# Patient Record
Sex: Male | Born: 1945 | Race: White | Hispanic: No | Marital: Married | Smoking: Current every day smoker
Health system: Southern US, Community
[De-identification: ages and names within clinical notes are randomized; demographics above are authoritative.]

## PROBLEM LIST (undated history)

## (undated) DIAGNOSIS — F03918 Unspecified dementia, unspecified severity, with other behavioral disturbance: Secondary | ICD-10-CM

## (undated) DIAGNOSIS — E119 Type 2 diabetes mellitus without complications: Secondary | ICD-10-CM

## (undated) DIAGNOSIS — F419 Anxiety disorder, unspecified: Secondary | ICD-10-CM

## (undated) DIAGNOSIS — I10 Essential (primary) hypertension: Secondary | ICD-10-CM

## (undated) DIAGNOSIS — Z87828 Personal history of other (healed) physical injury and trauma: Secondary | ICD-10-CM

## (undated) DIAGNOSIS — F0391 Unspecified dementia with behavioral disturbance: Secondary | ICD-10-CM

## (undated) HISTORY — PX: WRIST FRACTURE SURGERY: SHX121

## (undated) HISTORY — PX: OTHER SURGICAL HISTORY: SHX169

## (undated) HISTORY — PX: ROTATOR CUFF REPAIR: SHX139

## (undated) HISTORY — PX: HERNIA REPAIR: SHX51

## (undated) HISTORY — PX: TONSILLECTOMY: SUR1361

---

## 2014-07-07 DIAGNOSIS — Z87820 Personal history of traumatic brain injury: Secondary | ICD-10-CM | POA: Insufficient documentation

## 2014-07-07 DIAGNOSIS — K635 Polyp of colon: Secondary | ICD-10-CM | POA: Insufficient documentation

## 2014-07-12 ENCOUNTER — Emergency Department (HOSPITAL_COMMUNITY): Payer: Medicare HMO

## 2014-07-12 ENCOUNTER — Emergency Department (HOSPITAL_COMMUNITY)
Admission: EM | Admit: 2014-07-12 | Discharge: 2014-07-12 | Disposition: A | Discharge status: Home / Self Care | Payer: Medicare HMO | Attending: Emergency Medicine | Admitting: Emergency Medicine

## 2014-07-12 ENCOUNTER — Encounter (HOSPITAL_COMMUNITY): Payer: Self-pay | Admitting: Emergency Medicine

## 2014-07-12 DIAGNOSIS — F0391 Unspecified dementia with behavioral disturbance: Secondary | ICD-10-CM | POA: Insufficient documentation

## 2014-07-12 DIAGNOSIS — Z720 Tobacco use: Secondary | ICD-10-CM | POA: Diagnosis not present

## 2014-07-12 DIAGNOSIS — E119 Type 2 diabetes mellitus without complications: Secondary | ICD-10-CM | POA: Diagnosis not present

## 2014-07-12 DIAGNOSIS — I951 Orthostatic hypotension: Secondary | ICD-10-CM | POA: Diagnosis not present

## 2014-07-12 DIAGNOSIS — Z87828 Personal history of other (healed) physical injury and trauma: Secondary | ICD-10-CM | POA: Insufficient documentation

## 2014-07-12 DIAGNOSIS — I10 Essential (primary) hypertension: Secondary | ICD-10-CM | POA: Diagnosis not present

## 2014-07-12 DIAGNOSIS — R4182 Altered mental status, unspecified: Secondary | ICD-10-CM | POA: Diagnosis present

## 2014-07-12 DIAGNOSIS — R55 Syncope and collapse: Secondary | ICD-10-CM

## 2014-07-12 HISTORY — DX: Type 2 diabetes mellitus without complications: E11.9

## 2014-07-12 HISTORY — DX: Essential (primary) hypertension: I10

## 2014-07-12 HISTORY — DX: Unspecified dementia with behavioral disturbance: F03.91

## 2014-07-12 HISTORY — DX: Unspecified dementia, unspecified severity, with other behavioral disturbance: F03.918

## 2014-07-12 HISTORY — DX: Anxiety disorder, unspecified: F41.9

## 2014-07-12 HISTORY — DX: Personal history of other (healed) physical injury and trauma: Z87.828

## 2014-07-12 LAB — CBC WITH DIFFERENTIAL/PLATELET
BASOS ABS: 0 10*3/uL (ref 0.0–0.1)
Basophils Relative: 0 % (ref 0–1)
EOS ABS: 0.2 10*3/uL (ref 0.0–0.7)
Eosinophils Relative: 2 % (ref 0–5)
HCT: 43.1 % (ref 39.0–52.0)
Hemoglobin: 14.9 g/dL (ref 13.0–17.0)
LYMPHS ABS: 2.4 10*3/uL (ref 0.7–4.0)
Lymphocytes Relative: 34 % (ref 12–46)
MCH: 32.8 pg (ref 26.0–34.0)
MCHC: 34.6 g/dL (ref 30.0–36.0)
MCV: 94.9 fL (ref 78.0–100.0)
MONOS PCT: 7 % (ref 3–12)
Monocytes Absolute: 0.5 10*3/uL (ref 0.1–1.0)
NEUTROS PCT: 57 % (ref 43–77)
Neutro Abs: 3.9 10*3/uL (ref 1.7–7.7)
PLATELETS: 247 10*3/uL (ref 150–400)
RBC: 4.54 MIL/uL (ref 4.22–5.81)
RDW: 12.9 % (ref 11.5–15.5)
WBC: 6.9 10*3/uL (ref 4.0–10.5)

## 2014-07-12 LAB — URINALYSIS, ROUTINE W REFLEX MICROSCOPIC
BILIRUBIN URINE: NEGATIVE
HGB URINE DIPSTICK: NEGATIVE
Ketones, ur: NEGATIVE mg/dL
Leukocytes, UA: NEGATIVE
Nitrite: NEGATIVE
Protein, ur: NEGATIVE mg/dL
Specific Gravity, Urine: 1.027 (ref 1.005–1.030)
UROBILINOGEN UA: 0.2 mg/dL (ref 0.0–1.0)
pH: 5 (ref 5.0–8.0)

## 2014-07-12 LAB — URINE MICROSCOPIC-ADD ON

## 2014-07-12 LAB — COMPREHENSIVE METABOLIC PANEL
ALK PHOS: 50 U/L (ref 39–117)
ALT: 49 U/L (ref 0–53)
AST: 30 U/L (ref 0–37)
Albumin: 4 g/dL (ref 3.5–5.2)
Anion gap: 11 (ref 5–15)
BUN: 26 mg/dL — AB (ref 6–23)
CALCIUM: 9.5 mg/dL (ref 8.4–10.5)
CHLORIDE: 100 mmol/L (ref 96–112)
CO2: 23 mmol/L (ref 19–32)
CREATININE: 1.5 mg/dL — AB (ref 0.50–1.35)
GFR calc Af Amer: 53 mL/min — ABNORMAL LOW (ref >90)
GFR calc non Af Amer: 45 mL/min — ABNORMAL LOW (ref >90)
Glucose, Bld: 241 mg/dL — ABNORMAL HIGH (ref 70–99)
Potassium: 4.5 mmol/L (ref 3.5–5.1)
Sodium: 134 mmol/L — ABNORMAL LOW (ref 135–145)
Total Bilirubin: 0.6 mg/dL (ref 0.3–1.2)
Total Protein: 6.7 g/dL (ref 6.0–8.3)

## 2014-07-12 LAB — TROPONIN I: Troponin I: <0.03 ng/mL (ref <0.031)

## 2014-07-12 MED ORDER — SODIUM CHLORIDE 0.9 % IV BOLUS (SEPSIS): 500.0000 mL | Freq: Once | INTRAVENOUS | Status: DC

## 2014-07-12 NOTE — ED Provider Notes (Signed)
CSN: [id number]     Arrival date & time 07/12/14  1849 History   First MD Initiated Contact with Patient 07/12/14 1900     Chief Complaint  Patient presents with  . Altered Mental Status    Patient is a 69 y.o. male presenting with altered mental status. The history is provided by the patient, the spouse and a relative. No language interpreter was used.  Altered Mental Status  Dustin Ferrell presents for evaluation of 2 episodes of weakness. He had an episode about 1 PM today when he was bent over to fix the TV and when he got up he had significant truncal ataxia and difficulty with standing and walking. The episode lasted 1-2 minutes. He reported no complaints at that time. He had a second episode while he was getting ready for dinner when he was weak and shaky and was staring off into space and not interact with family.  That episode lasted about 30 seconds. He had no syncope during these events. He denies any current symptoms. He has no history of fever, cough, shortness of breath, chest pain, abdominal pain, nausea, vomiting, diarrhea. He has a history of frontotemporal dementia and high blood pressure as well as diabetes. He was recently started on propanolol for his high blood pressure.  Past Medical History  Diagnosis Date  . Hypertension   . Diabetes mellitus without complication   . 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 Dementia with behavioral disturbance   . History of traumatic head injury    Past Surgical History  Procedure Laterality Date  . Tonsillectomy    . Hernia repair    . Brain surgery     No family history on file. History  Substance Use Topics  . Smoking status: Current Every Day Smoker -- 1.00 packs/day    Types: Cigarettes  . Smokeless tobacco: Not on file  . Alcohol Use: Yes     Comment: heavy drinker until 5 weeks ago    Review of Systems  All other systems reviewed and are negative.     Allergies  Review of patient's  allergies indicates no known allergies.  Home Medications   Prior to Admission medications   Not on File   There were no vitals taken for this visit. Physical Exam  Constitutional: He is oriented to person, place, and time. He appears well-developed and well-nourished.  HENT:  Head: Normocephalic and atraumatic.  Cardiovascular: Normal rate and regular rhythm.   No murmur heard. Pulmonary/Chest: Effort normal and breath sounds normal. No respiratory distress.  Abdominal: Soft. There is no tenderness. There is no rebound and no guarding.  Musculoskeletal: He exhibits no edema or tenderness.  Neurological: He is alert and oriented to person, place, and time.  Skin: Skin is warm and dry.  Psychiatric: He has a normal mood and affect. His behavior is normal.  Nursing note and vitals reviewed.   ED Course  Procedures (including critical care time) Labs Review Labs Reviewed  COMPREHENSIVE METABOLIC PANEL - Abnormal; Notable for the following:    Sodium 134 (*)    Glucose, Bld 241 (*)    BUN 26 (*)    Creatinine, Ser 1.50 (*)    GFR calc non Af Amer 45 (*)    GFR calc Af Amer 53 (*)    All other components within normal limits  URINALYSIS, ROUTINE W REFLEX MICROSCOPIC - Abnormal; Notable for the following:    Glucose, UA >1000 (*)  All other components within normal limits  CBC WITH DIFFERENTIAL/PLATELET  TROPONIN I  URINE MICROSCOPIC-ADD ON    Imaging Review Dg Chest 2 View  07/12/2014   CLINICAL DATA:  Two episodes of being unable to speak  EXAM: CHEST  2 VIEW  COMPARISON:  None.  FINDINGS: The lungs are clear. There are no effusions. Pulmonary vasculature is normal. Hilar, mediastinal and cardiac contours appear unremarkable.  Old healed fracture deformities are incidentally noted about several left ribs. No significant skeletal abnormalities are evident.  IMPRESSION: No active cardiopulmonary disease.   Electronically Signed   By: Andreas Newport M.D.   On: 07/12/2014  20:29   Ct Head Wo Contrast  07/12/2014   CLINICAL DATA:  Altered mental status  EXAM: CT HEAD WITHOUT CONTRAST  TECHNIQUE: Contiguous axial images were obtained from the base of the skull through the vertex without intravenous contrast.  COMPARISON:  None.  FINDINGS: There is age related volume loss. There is encephalomalacia in the right frontal lobe. There is no mass, hemorrhage, extra-axial fluid collection, or midline shift. There is mild small vessel disease in the centra semiovale bilaterally. No acute appearing infarct present. There is evidence of previous craniotomy in the right inferior frontal region. Elsewhere bony calvarium appears intact. Mastoid air cells are clear. There is multifocal ethmoid sinus disease bilaterally. There is also mucosal thickening in the left frontal sinus. There are retention cysts in each inferior maxillary antrum.  IMPRESSION: Extensive right frontal lobe encephalomalacia with postoperative change in this area. Age related volume loss with mild periventricular small vessel disease. No hemorrhage or mass effect. No acute appearing infarct. Multifocal paranasal sinus disease.   Electronically Signed   By: Lowella Grip III M.D.   On: 07/12/2014 20:53     EKG Interpretation   Date/Time:  Saturday July 12 2014 19:06:47 EST Ventricular Rate:  62 PR Interval:  168 QRS Duration: 110 QT Interval:  423 QTC Calculation: 429 R Axis:   48 Text Interpretation:  Sinus rhythm Q waves in V1, V2. borderline ST  elevatio in V2 with baseline wander Confirmed by Dustin Ferrell [phone]) on  07/12/2014 7:22:49 PM      MDM   Final diagnoses:  Orthostatic hypotension  Near syncope    Patient here for evaluation of 2 episodes. First episodes sounds like orthostatic hypotension with near-syncope. The second episode could be due to orthostatic hypotension versus seizure. Clinical picture is not consistent with CVA/TIA. Patient was orthostatic in the department -  recommended IV fluid hydration and reassessment but family prefers DC home with oral fluid hydration. Discussed importance of continuing hydration with close PCP follow-up for recheck of his creatinine and electrolytes. Also discussed return precautions. Patient referred to go for [facility] for further evaluation of his dementia and near-syncope versus seizure.  BMP with creatinine of 1.5, family unsure of his baseline.    Dustin Reichert, MD 07/12/14 [id number]

## 2014-07-12 NOTE — ED Notes (Addendum)
Pt arrives via ems, c/o 2 periods of patient standing, not being able to talk to family in a "catatonic like state" and some shaking according to his daughter. Pt states he remembers both events, alert, oriented. Pt also having some fluctuation in blood pressure today with it being very high then low, family states pt just began propranolol on Monday. Pt alert, oriented, nad.

## 2014-07-12 NOTE — Discharge Instructions (Signed)
Your Creatinine was elevated today at 1.5.  This needs to be rechecked in the next 2 days.  Drink lots of fluids for the next few days.  Stop taking the propanolol.  Check Dustin Ferrell's blood pressure one to two times daily.  Get rechecked immediately if he develops any new or worrisome symptoms.     Hypotension As your heart beats, it forces blood through your arteries. This force is your blood pressure. If your blood pressure is too low for you to go about your normal activities or to support the organs of your body, you have hypotension. Hypotension is also referred to as low blood pressure. When your blood pressure becomes too low, you may not get enough blood to your brain. As a result, you may feel weak, feel lightheaded, or develop a rapid heart rate. In a more severe case, you may faint. CAUSES Various conditions can cause hypotension. These include:  Blood loss.  Dehydration.  Heart or endocrine problems.  Pregnancy.  Severe infection.  Not having a well-balanced diet filled with needed nutrients.  Severe allergic reactions (anaphylaxis). Some medicines, such as blood pressure medicine or water pills (diuretics), may lower your blood pressure below normal. Sometimes taking too much medicine or taking medicine not as directed can cause hypotension. TREATMENT  Hospitalization is sometimes required for hypotension if fluid or blood replacement is needed, if time is needed for medicines to wear off, or if further monitoring is needed. Treatment might include changing your diet, changing your medicines (including medicines aimed at raising your blood pressure), and use of support stockings. HOME CARE INSTRUCTIONS   Drink enough fluids to keep your urine clear or pale yellow.  Take your medicines as directed by your health care provider.  Get up slowly from reclining or sitting positions. This gives your blood pressure a chance to adjust.  Wear support stockings as directed by your  health care provider.  Maintain a healthy diet by including nutritious food, such as fruits, vegetables, nuts, whole grains, and lean meats. SEEK MEDICAL CARE IF:  You have vomiting or diarrhea.  You have a fever for more than 2-3 days.  You feel more thirsty than usual.  You feel weak and tired. SEEK IMMEDIATE MEDICAL CARE IF:   You have chest pain or a fast or irregular heartbeat.  You have a loss of feeling in some part of your body, or you lose movement in your arms or legs.  You have trouble speaking.  You become sweaty or feel lightheaded.  You faint. MAKE SURE YOU:   Understand these instructions.  Will watch your condition.  Will get help right away if you are not doing well or get worse. Document Released: 05/16/2005 Document Revised: 03/06/2013 Document Reviewed: 11/16/2012 [facility] Patient Information 2015 [place], [place]. This information is not intended to replace advice given to you by your health care provider. Make sure you discuss any questions you have with your health care provider.  Near-Syncope Near-syncope (commonly known as near fainting) is sudden weakness, dizziness, or feeling like you might pass out. During an episode of near-syncope, you may also develop pale skin, have tunnel vision, or feel sick to your stomach (nauseous). Near-syncope may occur when getting up after sitting or while standing for a long time. It is caused by a sudden decrease in blood flow to the brain. This decrease can result from various causes or triggers, most of which are not serious. However, because near-syncope can sometimes be a sign of  something serious, a medical evaluation is required. The specific cause is often not determined. HOME CARE INSTRUCTIONS  Monitor your condition for any changes. The following actions may help to alleviate any discomfort you are experiencing:  Have someone stay with you until you feel stable.  Lie down right away and prop your feet up if  you start feeling like you might faint. Breathe deeply and steadily. Wait until all the symptoms have passed. Most of these episodes last only a few minutes. You may feel tired for several hours.   Drink enough fluids to keep your urine clear or pale yellow.   If you are taking blood pressure or heart medicine, get up slowly when seated or lying down. Take several minutes to sit and then stand. This can reduce dizziness.  Follow up with your health care provider as directed. SEEK IMMEDIATE MEDICAL CARE IF:   You have a severe headache.   You have unusual pain in the chest, abdomen, or back.   You are bleeding from the mouth or rectum, or you have black or tarry stool.   You have an irregular or very fast heartbeat.   You have repeated fainting or have seizure-like jerking during an episode.   You faint when sitting or lying down.   You have confusion.   You have difficulty walking.   You have severe weakness.   You have vision problems.  MAKE SURE YOU:   Understand these instructions.  Will watch your condition.  Will get help right away if you are not doing well or get worse. Document Released: 05/16/2005 Document Revised: 05/21/2013 Document Reviewed: 10/19/2012 [facility] Patient Information 2015 [place], [place]. This information is not intended to replace advice given to you by your health care provider. Make sure you discuss any questions you have with your health care provider.

## 2014-07-12 NOTE — ED Notes (Signed)
MD at bedside. 

## 2014-07-15 DIAGNOSIS — G903 Multi-system degeneration of the autonomic nervous system: Secondary | ICD-10-CM | POA: Insufficient documentation

## 2014-07-15 DIAGNOSIS — I951 Orthostatic hypotension: Secondary | ICD-10-CM | POA: Insufficient documentation

## 2014-09-30 ENCOUNTER — Emergency Department (HOSPITAL_COMMUNITY)
Admission: EM | Admit: 2014-09-30 | Discharge: 2014-10-01 | Disposition: A | Discharge status: Home / Self Care | Payer: Medicare HMO | Attending: Emergency Medicine | Admitting: Emergency Medicine

## 2014-09-30 ENCOUNTER — Emergency Department (HOSPITAL_COMMUNITY): Payer: Medicare HMO

## 2014-09-30 ENCOUNTER — Encounter (HOSPITAL_COMMUNITY): Payer: Self-pay

## 2014-09-30 DIAGNOSIS — F0280 Dementia in other diseases classified elsewhere without behavioral disturbance: Secondary | ICD-10-CM | POA: Diagnosis not present

## 2014-09-30 DIAGNOSIS — E119 Type 2 diabetes mellitus without complications: Secondary | ICD-10-CM | POA: Insufficient documentation

## 2014-09-30 DIAGNOSIS — F419 Anxiety disorder, unspecified: Secondary | ICD-10-CM | POA: Diagnosis not present

## 2014-09-30 DIAGNOSIS — R55 Syncope and collapse: Secondary | ICD-10-CM | POA: Diagnosis not present

## 2014-09-30 DIAGNOSIS — R410 Disorientation, unspecified: Secondary | ICD-10-CM

## 2014-09-30 DIAGNOSIS — G3109 Other frontotemporal dementia: Secondary | ICD-10-CM | POA: Diagnosis not present

## 2014-09-30 DIAGNOSIS — R270 Ataxia, unspecified: Secondary | ICD-10-CM | POA: Insufficient documentation

## 2014-09-30 DIAGNOSIS — R278 Other lack of coordination: Secondary | ICD-10-CM

## 2014-09-30 DIAGNOSIS — I10 Essential (primary) hypertension: Secondary | ICD-10-CM | POA: Diagnosis not present

## 2014-09-30 DIAGNOSIS — Z720 Tobacco use: Secondary | ICD-10-CM | POA: Diagnosis not present

## 2014-09-30 DIAGNOSIS — R569 Unspecified convulsions: Secondary | ICD-10-CM

## 2014-09-30 DIAGNOSIS — Z7982 Long term (current) use of aspirin: Secondary | ICD-10-CM | POA: Diagnosis not present

## 2014-09-30 DIAGNOSIS — Z87828 Personal history of other (healed) physical injury and trauma: Secondary | ICD-10-CM | POA: Diagnosis not present

## 2014-09-30 DIAGNOSIS — Z79899 Other long term (current) drug therapy: Secondary | ICD-10-CM | POA: Insufficient documentation

## 2014-09-30 LAB — CBC
HCT: 40.5 % (ref 39.0–52.0)
Hemoglobin: 13.6 g/dL (ref 13.0–17.0)
MCH: 32.1 pg (ref 26.0–34.0)
MCHC: 33.6 g/dL (ref 30.0–36.0)
MCV: 95.5 fL (ref 78.0–100.0)
PLATELETS: 260 10*3/uL (ref 150–400)
RBC: 4.24 MIL/uL (ref 4.22–5.81)
RDW: 13.6 % (ref 11.5–15.5)
WBC: 6.9 10*3/uL (ref 4.0–10.5)

## 2014-09-30 LAB — COMPREHENSIVE METABOLIC PANEL
ALT: 33 U/L (ref 17–63)
AST: 23 U/L (ref 15–41)
Albumin: 3.8 g/dL (ref 3.5–5.0)
Alkaline Phosphatase: 45 U/L (ref 38–126)
Anion gap: 16 — ABNORMAL HIGH (ref 5–15)
BILIRUBIN TOTAL: 0.4 mg/dL (ref 0.3–1.2)
BUN: 13 mg/dL (ref 6–20)
CALCIUM: 9.3 mg/dL (ref 8.9–10.3)
CO2: 19 mmol/L — AB (ref 22–32)
Chloride: 100 mmol/L — ABNORMAL LOW (ref 101–111)
Creatinine, Ser: 0.83 mg/dL (ref 0.61–1.24)
GFR calc Af Amer: >60 mL/min (ref >60)
Glucose, Bld: 224 mg/dL — ABNORMAL HIGH (ref 70–99)
POTASSIUM: 4.5 mmol/L (ref 3.5–5.1)
Sodium: 135 mmol/L (ref 135–145)
Total Protein: 6.3 g/dL — ABNORMAL LOW (ref 6.5–8.1)

## 2014-09-30 LAB — URINALYSIS, ROUTINE W REFLEX MICROSCOPIC
Bilirubin Urine: NEGATIVE
GLUCOSE, UA: 250 mg/dL — AB
HGB URINE DIPSTICK: NEGATIVE
Ketones, ur: 15 mg/dL — AB
LEUKOCYTES UA: NEGATIVE
Nitrite: NEGATIVE
PROTEIN: NEGATIVE mg/dL
SPECIFIC GRAVITY, URINE: 1.018 (ref 1.005–1.030)
Urobilinogen, UA: 0.2 mg/dL (ref 0.0–1.0)
pH: 5 (ref 5.0–8.0)

## 2014-09-30 LAB — CBG MONITORING, ED: GLUCOSE-CAPILLARY: 224 mg/dL — AB (ref 70–99)

## 2014-09-30 LAB — I-STAT TROPONIN, ED: Troponin i, poc: 0 ng/mL (ref 0.00–0.08)

## 2014-09-30 NOTE — ED Notes (Signed)
Pt here for syncopal episodes over the last week, pt moved here in December and since has had issues with DM and allergies. Nasal congestion noted. Pt joking with staff on assessment, pt denies pain, answers all questions appropriately

## 2014-09-30 NOTE — ED Notes (Signed)
Patient has not returned from CT or Xray.

## 2014-09-30 NOTE — ED Provider Notes (Signed)
CSN: [id number]     Arrival date & time 09/30/14  1953 History   First MD Initiated Contact with Patient 09/30/14 2000     Chief Complaint  Patient presents with  . Loss of Consciousness     (Consider location/radiation/quality/duration/timing/severity/associated sxs/prior Treatment) Patient is a 69 y.o. male presenting with syncope. The history is provided by the spouse and a relative. No language interpreter was used.  Loss of Consciousness Episode history:  Single Most recent episode:  Today Progression:  Resolved Chronicity:  New Context comment:  During episode of ataxia, confusion Witnessed: yes   Relieved by:  Lying down Worsened by:  Nothing tried Ineffective treatments:  None tried Associated symptoms: confusion   Associated symptoms: no chest pain, no difficulty breathing, no fever, no focal weakness, no shortness of breath, no vomiting and no weakness   Risk factors: no coronary artery disease     Past Medical History  Diagnosis Date  . Hypertension   . Diabetes mellitus without complication   . 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 Dementia with behavioral disturbance   . History of traumatic head injury    Past Surgical History  Procedure Laterality Date  . Tonsillectomy    . Hernia repair    . Brain surgery     History reviewed. No pertinent family history. History  Substance Use Topics  . Smoking status: Current Every Day Smoker -- 1.00 packs/day    Types: Cigarettes  . Smokeless tobacco: Not on file  . Alcohol Use: Yes     Comment: heavy drinker until 5 weeks ago    Review of Systems  Unable to perform ROS: Dementia  Constitutional: Negative for fever and fatigue.  Respiratory: Negative for chest tightness and shortness of breath.   Cardiovascular: Positive for syncope. Negative for chest pain.  Gastrointestinal: Negative for vomiting and abdominal pain.  Musculoskeletal: Positive for gait problem (intermittent  truncal ataxia, wide based gait).  Neurological: Negative for focal weakness, facial asymmetry and weakness.  Psychiatric/Behavioral: Positive for behavioral problems, confusion and agitation.  All other systems reviewed and are negative.    Allergies  Review of patient's allergies indicates no known allergies.  Home Medications   Prior to Admission medications   Medication Sig Start Date End Date Taking? Authorizing Provider  aspirin EC 81 MG tablet Take 81 mg by mouth daily.   Yes Historical Provider, MD  citalopram (CELEXA) 40 MG tablet Take 40 mg by mouth daily.   Yes Historical Provider, MD  clonazePAM (KLONOPIN) 1 MG tablet Take 0.5 mg by mouth daily.    Yes Historical Provider, MD  divalproex (DEPAKOTE ER) 500 MG 24 hr tablet Take 500 mg by mouth daily.   Yes Historical Provider, MD  lisinopril (PRINIVIL,ZESTRIL) 5 MG tablet Take 5 mg by mouth daily.   Yes Historical Provider, MD  metFORMIN (GLUCOPHAGE) 1000 MG tablet Take 1,000 mg by mouth 2 (two) times daily with a meal.   Yes Historical Provider, MD  sitaGLIPtin (JANUVIA) 100 MG tablet Take 100 mg by mouth daily.   Yes Historical Provider, MD   BP 122/57 mmHg  Pulse 83  Temp(Src) 97.8 F (36.6 C) (Oral)  Resp 15  Ht 5' 6.5" (1.689 m)  Wt 158 lb (71.668 kg)  BMI 25.12 kg/m2  SpO2 94%   Physical Exam  Constitutional: Vital signs are normal. He appears well-developed and well-nourished. He is easily aroused.  Non-toxic appearance. No distress.  HENT:  Head:  Normocephalic and atraumatic.  Nose: Nose normal.  Mouth/Throat: Oropharynx is clear and moist. No oropharyngeal exudate.  Eyes: EOM are normal. Pupils are equal, round, and reactive to light.  Neck: Normal range of motion. Neck supple.  Cardiovascular: Normal rate, regular rhythm, normal heart sounds and intact distal pulses.   No murmur heard. Pulmonary/Chest: Effort normal and breath sounds normal. No respiratory distress. He has no wheezes. He exhibits no  tenderness.  Abdominal: Soft. He exhibits no distension. There is no tenderness. There is no guarding.  Musculoskeletal: Normal range of motion. He exhibits no tenderness.  Atraumatic exam, moving all extremities.  Normal coordination.    Neurological: He is alert and easily aroused. No cranial nerve deficit. Coordination normal.  Oriented to person and place.  Unable to provide significant details about the event but remembers EMS arriving.  Oriented to family at bedside.  Mild limitations at higher level brain functioning/reasoning noted.    Skin: Skin is warm and dry. He is not diaphoretic. No pallor.  Psychiatric: He has a normal mood and affect. His behavior is normal. Judgment and thought content normal.  Nursing note and vitals reviewed.   ED Course  Procedures (including critical care time) Labs Review Labs Reviewed  COMPREHENSIVE METABOLIC PANEL - Abnormal; Notable for the following:    Chloride 100 (*)    CO2 19 (*)    Glucose, Bld 224 (*)    Total Protein 6.3 (*)    Anion gap 16 (*)    All other components within normal limits  URINALYSIS, ROUTINE W REFLEX MICROSCOPIC - Abnormal; Notable for the following:    Glucose, UA 250 (*)    Ketones, ur 15 (*)    All other components within normal limits  VALPROIC ACID LEVEL - Abnormal; Notable for the following:    Valproic Acid Lvl <10 (*)    All other components within normal limits  CBG MONITORING, ED - Abnormal; Notable for the following:    Glucose-Capillary 224 (*)    All other components within normal limits  CBC  I-STAT TROPOININ, ED    Imaging Review Dg Chest 2 View  09/30/2014   CLINICAL DATA:  Syncopal episode.  Hypertension.  Diabetes.  EXAM: CHEST  2 VIEW  COMPARISON:  07/12/2014  FINDINGS: There are old healed rib fracture deformities of the left lateral chest wall. The lungs are clear. The pulmonary vasculature is normal. Hilar, mediastinal and cardiac contours are unremarkable and unchanged. There are no pleural  effusions.  IMPRESSION: No active cardiopulmonary disease.   Electronically Signed   By: Andreas Newport M.D.   On: 09/30/2014 21:24   Ct Head Wo Contrast  09/30/2014   CLINICAL DATA:  Multiple syncopal episodes over last week, diabetes, hypertension, history of traumatic head injury and smoking  EXAM: CT HEAD WITHOUT CONTRAST  TECHNIQUE: Contiguous axial images were obtained from the base of the skull through the vertex without intravenous contrast.  COMPARISON:  07/12/2014  FINDINGS: Wire sutures at RIGHT supraorbital rim.  Linear lucency in the frontal bone could be related to a prior frontal bone fracture or surgery.  Generalized atrophy.  Normal ventricular morphology.  No midline shift or mass effect.  Encephalomalacia at RIGHT frontal lobe compatible with infarction, injury or surgery, stable.  Minimal small vessel chronic ischemic changes of deep cerebral white matter.  No intracranial hemorrhage, mass lesion or evidence acute infarction.  No extra-axial fluid collections.  Maxillary, frontal and ethmoid sinus disease.  Atherosclerotic calcifications at the carotid  siphons.  IMPRESSION: Chronic RIGHT frontal lobe encephalomalacia from either prior trauma, infarction or surgery.  Minimal small vessel chronic ischemic changes of deep cerebral white matter.  No acute intracranial abnormalities.   Electronically Signed   By: Lavonia Dana M.D.   On: 09/30/2014 22:03     EKG Interpretation   Date/Time:  Tuesday Sep 30 2014 19:59:16 EDT Ventricular Rate:  73 PR Interval:  165 QRS Duration: 79 QT Interval:  393 QTC Calculation: 433 R Axis:   40 Text Interpretation:  Sinus rhythm Probable left atrial enlargement RSR'  in V1 or V2, right VCD or RVH Minimal ST elevation, anterior leads No  significant change since last tracing Confirmed by BEATON  MD, ROBERT  ([id number]) on 09/30/2014 8:07:48 PM      MDM   Final diagnoses:  Truncal ataxia  Confusion  Fronto-temporal dementia  Seizure-like  activity  Syncope and collapse   Pt is a 69 yo M with hx of HTN, DM, frontotemporal dementia, who presents with recurrent episodes episodes of truncal ataxia, increased agitation/behavior changes, and today had an associated syncopal event during it.  He lives at home with wife but is able to take care of most of his ADLs independently.  The patient is still driving, able to work in the yard, [employer], [employer] research officer, government. Family reports that he has had increased episodes where he is unsteady on his feet, has a wide based gait, drops things, and during these periods the patient becomes increasingly agitated and confused.  Then, after the acute ataxia/agitation occurs, the patient then tends to have a period of post-ictal like fatigue.  These events have occurred over the past several months, and are becoming more frequent with up to 3 x weekly now.  Family reports that he had a similar episode today.  He was in his normal state of health cooking, then the wife heard him dropping plates on the ground, found him standing in front of the oven with unsteady gait, he was agitated and yelling.  Wife helped him sit down in a chair and in the process of sitting, the patient went limp, fell to the ground, and had + LOC for several moments on the ground, concerning for syncope.  The patient regained consciousness and was again very agitated.  The family tried to get him in the car to come get evaluated in the ED for the syncopal episode, but he was so aggressive towards the family they were unable to get him in the car.  EMS was called and the patient received a dose of benzos then was brought to the ED.  He takes depakote 500 mg qHS to help sleep and to help control his behavior changes.  No hx of seizure in the past.  Hx of HTN but no cardiac disease.  No hx of syncope before.   Will work up for syncopal event/confusion with EKG, CT head, and blood work.   History concerning for possible recurrent seizure-like events with postictal  confusion/agitation then somnolence.  Depakote level sent.   Glucose 224, AG 16, CO2 19.  Has a hx of diabetes but has recently been decreasing down his meds per PCP after these ataxic events have been occurring more frequently.  Now is controlled on metformin only, previously also on januvia.   CT head showed right frontal encephalomalacia, no new changes.   EKG: NSR at 73 bpm, nonspecific ST elevation changes likely associated with baseline wander and artifact, no specific changes suggestive of ischemia or  arrhythmia.  T waves benign.  Normal sodium and potassium.  Good kidney function.  No anemia.  Trop negative.    Depakote level < 10.  Neurology was consulted due to the frequent ataxic episodes.   Dr. Aram Beecham reports that it is somewhat atypical history for seizures, but that seizures frequently occur with frontotemporal demenita.  He recommends increasing depakote to BID and to follow up with outpatient EEG with Dr. Delice Lesch.    Initial syncope work up was benign.    Discussed with family (wife and daughters) at length about options/goals of care.  The patient is full code but the family voices hesitation about some possible future interventions.  They would be agreeable to a surgery that would improve patient's quality of life but do not believe they would want to pursue any elective procedures at this point.  One of his daughters is a Family Medicine physician who was very helpful in this discussion with pt's wife.   Advised that as the patient has not had any evidence of cardiogenic pathology on the work up today, has negative biomarkers, and his syncopal event was associated with this disorientation/possible seizure event, that we likely will not find much on telemetry.  Doubt that staying overnight on a telemetry monitor will bring much to the table as far as uncovering a cause of today's syncope.  I believe the patient would be appropriate for further outpatient syncope work up with MRI and  carotid ultrasounds if the family wishes to pursue this.  Family voiced understanding and agreement to this plan.  They have a PCP and are able to follow up with Dr. Jonni Sanger as needed.   Patient has an appointment scheduled at [facility] clinic in a few weeks to establish care there with a dementia specialist.  Has not seen a neurologist before in the area.  Given phone number to follow up with Dr. Delice Lesch at [facility] neurolog[facility] to discuss EEG.   Discussed with the patient and family about risks of driving with dementia and with these episodes of incoordination/confusion.  Has had 4 episodes like this already in the past week and it is highly risky to continue to use machinery/drive.  It is unfortunate that patient's mental status waxes and wanes so much because he feels like his freedom is being taken away at his lucid times.  The patient does not seem to comprehend the risks that are involved with driving and what could happen with worse case scenarios of confusion/incoordination while behind the wheel.  He was advised that it is my recommendation that he does not drive again, or at least until these episodes are treated and do not recur.  Advised the patient and family that it is my recommendation to take his driving privileges away for now.  The family will continue to discuss this at home as it is understandably a source of significant distress to the patient.  Patient and family were given reassurance and all questions were answered prior to discharge in stable condition.   Patient was seen with ED Attending, Dr. Lucina Mellow, MD      Tori Milks, MD 10/01/14 1740  Leonard Schwartz, MD 10/08/14 1038

## 2014-10-01 DIAGNOSIS — R569 Unspecified convulsions: Secondary | ICD-10-CM

## 2014-10-01 LAB — VALPROIC ACID LEVEL: Valproic Acid Lvl: <10 ug/mL — ABNORMAL LOW (ref 50.0–100.0)

## 2014-10-01 MED ORDER — DIVALPROEX SODIUM ER 500 MG PO TB24: 500.0000 mg | ORAL_TABLET | Freq: Two times a day (BID) | ORAL | Status: DC

## 2014-10-01 NOTE — Consult Note (Addendum)
NEURO HOSPITALIST CONSULT NOTE   Referring physician: ED Reason for Consult: episodic truncal ataxia with falls and transient LOC, concern for seizures  HPI:                                                                                                                                          Dustin Ferrell is an 69 y.o. male with a past medical history significant for HTN, DM, severe head trauma many years ago, and behavioral variant fronto temporal dementia, comes in for further evaluation of the above stated symptoms. Patient's wife and daughter are at the bedside. His daughter is a family medicine physician and tells me that his father carries a diagnosis of probable FTD behavioral variant but still very functional, active individual that is still driving. She said that over the past few weeks his father has been experiencing increasing frequency of paroxysmal events characterized by sudden onset of truncal ataxia with marked unsteadiness, slurred speech, a drunk looking/stuporous looking, inability to follow commands, becomes combative, then falls to the floor and can have very brief periods of unconsciousness. No hyper motor activity noted, no bladder or bowel impairment. The episodes can last from 10 minutes to 1.5 hours, and afterwards he is amnestic for the events. He is averaging 3-4 episodes every week in the past 2-3 weeks. CT brain performed today was personally reviewed and  showed no acute abnormality but encephalomalacia right frontal region. Presently, he denies HA, vertigo, double vision, difficulty swallowing, slurred speech, focal weakness or numbness, or vision impairment. No new medications.  Takes Depakote 500 mg daily, VPA level <10 Past Medical History  Diagnosis Date  . Hypertension   . Diabetes mellitus without complication   . 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 Dementia with behavioral disturbance   . History  of traumatic head injury     Past Surgical History  Procedure Laterality Date  . Tonsillectomy    . Hernia repair    . Brain surgery      History reviewed. No pertinent family history.  Family History: no epilepsy, brain tumors, or brain aneurysms.  Social History:  reports that he has been smoking Cigarettes.  He has been smoking about 1.00 pack per day. He does not have any smokeless tobacco history on file. He reports that he drinks alcohol. He reports that he does not use illicit drugs.  No Known Allergies  MEDICATIONS:  I have reviewed the patient's current medications.   ROS:                                                                                                                                       History obtained from family and chart review  General ROS: negative for - chills, fatigue, fever, night sweats, weight gain or weight loss Psychological ROS: negative for - hallucinations or suicidal ideation Ophthalmic ROS: negative for - blurry vision, double vision, eye pain or loss of vision ENT ROS: negative for - epistaxis, nasal discharge, oral lesions, sore throat, tinnitus or vertigo Allergy and Immunology ROS: negative for - hives or itchy/watery eyes Hematological and Lymphatic ROS: negative for - bleeding problems, bruising or swollen lymph nodes Endocrine ROS: negative for - galactorrhea, hair pattern changes, polydipsia/polyuria or temperature intolerance Respiratory ROS: negative for - cough, hemoptysis, shortness of breath or wheezing Cardiovascular ROS: negative for - chest pain, dyspnea on exertion, edema or irregular heartbeat Gastrointestinal ROS: negative for - abdominal pain, diarrhea, hematemesis, nausea/vomiting or stool incontinence Genito-Urinary ROS: negative for - dysuria, hematuria, incontinence or urinary  frequency/urgency Musculoskeletal ROS: negative for - joint swelling or muscular weakness Neurological ROS: as noted in HPI Dermatological ROS: negative for rash and skin lesion changes   Physical exam: pleasant male in no apparent distress. Blood pressure 151/69, pulse 83, temperature 97.8 F (36.6 C), temperature source Oral, resp. rate 23, height 5' 6.5" (1.689 m), weight 71.668 kg (158 lb), SpO2 94 %. Head: normocephalic. Neck: supple, no bruits, no JVD. Cardiac: no murmurs. Lungs: clear. Abdomen: soft, no tender, no mass. Extremities: no edema, clubbing, or cyanosis. Neurologic Examination:                                                                                                      General: Mental Status: Alert, oriented, thought content appropriate.  Speech fluent without evidence of aphasia.  Able to follow 3 step commands without difficulty. Cranial Nerves: II: Discs flat bilaterally; Visual fields grossly normal, pupils equal, round, reactive to light and accommodation III,IV, VI: ptosis not present, extra-ocular motions intact bilaterally V,VII: smile symmetric, facial light touch sensation normal bilaterally VIII: hearing normal bilaterally IX,X: uvula rises symmetrically XI: bilateral shoulder shrug XII: midline tongue extension without atrophy or fasciculations Motor: Right : Upper extremity   5/5    Left:     Upper extremity   5/5  Lower extremity   5/5     Lower extremity  5/5 Tone and bulk:normal tone throughout; no atrophy noted Sensory: Pinprick and light touch intact throughout, bilaterally Deep Tendon Reflexes:  Right: Upper Extremity   Left: Upper extremity   biceps (C-5 to C-6) 2/4   biceps (C-5 to C-6) 2/4 tricep (C7) 2/4    triceps (C7) 2/4 Brachioradialis (C6) 2/4  Brachioradialis (C6) 2/4  Lower Extremity Lower Extremity  quadriceps (L-2 to L-4) 2/4   quadriceps (L-2 to L-4) 2/4 Achilles (S1) 2/4   Achilles (S1) 2/4  Plantars: Right:  downgoing   Left: downgoing Cerebellar: normal finger-to-nose,  normal heel-to-shin test Gait:  No tested due to multiple leads. CV: pulses palpable throughout    No results found for: CHOL  Results for orders placed or performed during the hospital encounter of 09/30/14 (from the past 48 hour(s))  CBC     Status: None   Collection Time: 09/30/14  8:13 PM  Result Value Ref Range   WBC 6.9 4.0 - 10.5 K/uL   RBC 4.24 4.22 - 5.81 MIL/uL   Hemoglobin 13.6 13.0 - 17.0 g/dL   HCT 40.5 39.0 - 52.0 %   MCV 95.5 78.0 - 100.0 fL   MCH 32.1 26.0 - 34.0 pg   MCHC 33.6 30.0 - 36.0 g/dL   RDW 13.6 11.5 - 15.5 %   Platelets 260 150 - 400 K/uL  Comprehensive metabolic panel     Status: Abnormal   Collection Time: 09/30/14  8:13 PM  Result Value Ref Range   Sodium 135 135 - 145 mmol/L   Potassium 4.5 3.5 - 5.1 mmol/L   Chloride 100 (L) 101 - 111 mmol/L   CO2 19 (L) 22 - 32 mmol/L   Glucose, Bld 224 (H) 70 - 99 mg/dL   BUN 13 6 - 20 mg/dL   Creatinine, Ser 0.83 0.61 - 1.24 mg/dL   Calcium 9.3 8.9 - 10.3 mg/dL   Total Protein 6.3 (L) 6.5 - 8.1 g/dL   Albumin 3.8 3.5 - 5.0 g/dL   AST 23 15 - 41 U/L   ALT 33 17 - 63 U/L   Alkaline Phosphatase 45 38 - 126 U/L   Total Bilirubin 0.4 0.3 - 1.2 mg/dL   GFR calc non Af Amer >60 >60 mL/min   GFR calc Af Amer >60 >60 mL/min    Comment: (NOTE) The eGFR has been calculated using the CKD EPI equation. This calculation has not been validated in all clinical situations. eGFR's persistently <90 mL/min signify possible Chronic Kidney Disease.    Anion gap 16 (H) 5 - 15  CBG monitoring, ED     Status: Abnormal   Collection Time: 09/30/14 10:23 PM  Result Value Ref Range   Glucose-Capillary 224 (H) 70 - 99 mg/dL  Urinalysis, Routine w reflex microscopic     Status: Abnormal   Collection Time: 09/30/14 10:27 PM  Result Value Ref Range   Color, Urine YELLOW YELLOW   APPearance CLEAR CLEAR   Specific Gravity, Urine 1.018 1.005 - 1.030   pH 5.0 5.0  - 8.0   Glucose, UA 250 (A) NEGATIVE mg/dL   Hgb urine dipstick NEGATIVE NEGATIVE   Bilirubin Urine NEGATIVE NEGATIVE   Ketones, ur 15 (A) NEGATIVE mg/dL   Protein, ur NEGATIVE NEGATIVE mg/dL   Urobilinogen, UA 0.2 0.0 - 1.0 mg/dL   Nitrite NEGATIVE NEGATIVE   Leukocytes, UA NEGATIVE NEGATIVE    Comment: MICROSCOPIC NOT DONE ON URINES WITH NEGATIVE PROTEIN, BLOOD, LEUKOCYTES, NITRITE, OR GLUCOSE <1000 mg/dL.  Valproic acid  level     Status: Abnormal   Collection Time: 09/30/14 11:13 PM  Result Value Ref Range   Valproic Acid Lvl <10 (L) 50.0 - 100.0 ug/mL    Comment: RESULTS CONFIRMED BY MANUAL DILUTION  I-Stat Troponin, ED (not at [facility])     Status: None   Collection Time: 09/30/14 11:15 PM  Result Value Ref Range   Troponin i, poc 0.00 0.00 - 0.08 ng/mL   Comment 3            Comment: Due to the release kinetics of cTnI, a negative result within the first hours of the onset of symptoms does not rule out myocardial infarction with certainty. If myocardial infarction is still suspected, repeat the test at appropriate intervals.     Dg Chest 2 View  09/30/2014   CLINICAL DATA:  Syncopal episode.  Hypertension.  Diabetes.  EXAM: CHEST  2 VIEW  COMPARISON:  07/12/2014  FINDINGS: There are old healed rib fracture deformities of the left lateral chest wall. The lungs are clear. The pulmonary vasculature is normal. Hilar, mediastinal and cardiac contours are unremarkable and unchanged. There are no pleural effusions.  IMPRESSION: No active cardiopulmonary disease.   Electronically Signed   By: Andreas Newport M.D.   On: 09/30/2014 21:24   Ct Head Wo Contrast  09/30/2014   CLINICAL DATA:  Multiple syncopal episodes over last week, diabetes, hypertension, history of traumatic head injury and smoking  EXAM: CT HEAD WITHOUT CONTRAST  TECHNIQUE: Contiguous axial images were obtained from the base of the skull through the vertex without intravenous contrast.  COMPARISON:  07/12/2014  FINDINGS:  Wire sutures at RIGHT supraorbital rim.  Linear lucency in the frontal bone could be related to a prior frontal bone fracture or surgery.  Generalized atrophy.  Normal ventricular morphology.  No midline shift or mass effect.  Encephalomalacia at RIGHT frontal lobe compatible with infarction, injury or surgery, stable.  Minimal small vessel chronic ischemic changes of deep cerebral white matter.  No intracranial hemorrhage, mass lesion or evidence acute infarction.  No extra-axial fluid collections.  Maxillary, frontal and ethmoid sinus disease.  Atherosclerotic calcifications at the carotid siphons.  IMPRESSION: Chronic RIGHT frontal lobe encephalomalacia from either prior trauma, infarction or surgery.  Minimal small vessel chronic ischemic changes of deep cerebral white matter.  No acute intracranial abnormalities.   Electronically Signed   By: Lavonia Dana M.D.   On: 09/30/2014 22:03   Assessment/Plan: 69 y.o. male with a past medical history significant for HTN, DM, severe head trauma many years ago, and behavioral variant fronto temporal dementia, presents for evaluation of recurrent, paroxysmal, stereotype spells described by his daughter (a family medicine physician) as "sudden onset of truncal ataxia with marked unsteadiness, slurred speech, a drunk looking/stuporous looking, inability to follow commands, becomes combative, then falls to the floor and can have very brief periods of unconsciousness. No hyper motor activity noted, no bladder or bowel impairment. The episodes can last from 10 minutes to 1.5 hours, and afterwards he is amnestic for the events. Although very stereotype pattern, I am not quite convinced that such episodes represent seizures due to very prolonged duration and rather unusual onset. Syncope in the setting of dysautonomia is a well described phenomenon in dementia in general and particularly on FTD behavioral variant, but the duration of the events speaks against it. It is unclear  to me if such events are associated to his underlying FTD with an overlap movement disorder. Suggested outpatient neurology  follow up with epileptologist Dr. Delice Lesch at [facility] and also further investigations by behavioral neurologist at [facility]. Consider increasing Depakote to 500 mg BID.  Dorian Pod, MD 10/01/2014, 12:43 AM  [facility]

## 2014-10-03 DIAGNOSIS — R55 Syncope and collapse: Secondary | ICD-10-CM | POA: Insufficient documentation

## 2014-10-06 ENCOUNTER — Telehealth (HOSPITAL_COMMUNITY): Payer: Self-pay | Admitting: *Deleted

## 2014-10-07 ENCOUNTER — Other Ambulatory Visit (HOSPITAL_COMMUNITY): Payer: Self-pay | Admitting: Family Medicine

## 2014-10-07 DIAGNOSIS — G459 Transient cerebral ischemic attack, unspecified: Secondary | ICD-10-CM

## 2014-10-07 DIAGNOSIS — R55 Syncope and collapse: Secondary | ICD-10-CM

## 2014-10-09 ENCOUNTER — Ambulatory Visit (HOSPITAL_BASED_OUTPATIENT_CLINIC_OR_DEPARTMENT_OTHER)
Admission: RE | Admit: 2014-10-09 | Discharge: 2014-10-09 | Disposition: A | Discharge status: Home / Self Care | Payer: Medicare HMO | Source: Ambulatory Visit | Attending: Cardiovascular Disease | Admitting: Cardiovascular Disease

## 2014-10-09 ENCOUNTER — Ambulatory Visit (HOSPITAL_COMMUNITY)
Admission: RE | Admit: 2014-10-09 | Discharge: 2014-10-09 | Disposition: A | Discharge status: Home / Self Care | Payer: Medicare HMO | Source: Ambulatory Visit | Attending: Cardiovascular Disease | Admitting: Cardiovascular Disease

## 2014-10-09 DIAGNOSIS — I6523 Occlusion and stenosis of bilateral carotid arteries: Secondary | ICD-10-CM | POA: Diagnosis not present

## 2014-10-09 DIAGNOSIS — G459 Transient cerebral ischemic attack, unspecified: Secondary | ICD-10-CM | POA: Diagnosis present

## 2014-10-09 DIAGNOSIS — I10 Essential (primary) hypertension: Secondary | ICD-10-CM | POA: Diagnosis not present

## 2014-10-09 DIAGNOSIS — R55 Syncope and collapse: Secondary | ICD-10-CM | POA: Diagnosis not present

## 2014-10-09 DIAGNOSIS — E119 Type 2 diabetes mellitus without complications: Secondary | ICD-10-CM | POA: Insufficient documentation

## 2014-10-15 DIAGNOSIS — F0281 Dementia in other diseases classified elsewhere with behavioral disturbance: Secondary | ICD-10-CM | POA: Insufficient documentation

## 2014-10-15 DIAGNOSIS — G4733 Obstructive sleep apnea (adult) (pediatric): Secondary | ICD-10-CM | POA: Insufficient documentation

## 2014-10-15 DIAGNOSIS — S069X9S Unspecified intracranial injury with loss of consciousness of unspecified duration, sequela: Secondary | ICD-10-CM

## 2014-10-15 DIAGNOSIS — F02818 Dementia in other diseases classified elsewhere, unspecified severity, with other behavioral disturbance: Secondary | ICD-10-CM | POA: Insufficient documentation

## 2014-10-15 DIAGNOSIS — R29818 Other symptoms and signs involving the nervous system: Secondary | ICD-10-CM | POA: Insufficient documentation

## 2014-10-24 ENCOUNTER — Telehealth (HOSPITAL_COMMUNITY): Payer: Self-pay | Admitting: *Deleted

## 2015-07-09 LAB — CBC AND DIFFERENTIAL
HCT: 43 (ref 41–53)
HEMOGLOBIN: 13.6 (ref 13.5–17.5)
PLATELETS: 288 (ref 150–399)
WBC: 8.1

## 2015-07-09 LAB — HEMOGLOBIN A1C: HEMOGLOBIN A1C: 8.4

## 2015-07-09 LAB — HEPATIC FUNCTION PANEL
ALT: 26 (ref 10–40)
AST: 18 (ref 14–40)
Alkaline Phosphatase: 48 (ref 25–125)

## 2015-07-09 LAB — TSH: TSH: 0.7 (ref <=5.90)

## 2015-07-09 LAB — BASIC METABOLIC PANEL
BUN: 17 (ref 4–21)
Creatinine: 0.9 (ref <=1.3)
Glucose: 241
POTASSIUM: 5.2 (ref 3.4–5.3)
Sodium: 140 (ref 137–147)

## 2015-07-09 LAB — LIPID PANEL
CHOLESTEROL: 157 (ref 0–200)
HDL: 53 (ref 35–70)
LDL CALC: 75
Triglycerides: 147 (ref 40–160)

## 2015-09-22 LAB — HM COLONOSCOPY

## 2015-10-29 LAB — HM DIABETES EYE EXAM

## 2016-10-20 LAB — COMPLETE METABOLIC PANEL WITH GFR
ALK PHOS: 46
ALT: 23
AST: 17
BUN: 20
Creat: 0.87
EGFR (Non-African Amer.): 86
Glucose: 148
POTASSIUM: 5.2
SODIUM: 140
Total bilirubin, fluid: <0.2

## 2016-10-20 LAB — LIPID PANEL
CHOLESTEROL, TOTAL: 188
HDL: 56
LDL: 79
Triglycerides: 265

## 2016-10-20 LAB — HEMOGLOBIN A1C: A1c: 7.8

## 2016-10-20 LAB — CBC
HEMATOCRIT: 42
Hemoglobin: 14
PLATELETS: 292
WBC: 10.1

## 2016-12-13 ENCOUNTER — Other Ambulatory Visit: Payer: Self-pay | Admitting: Adult Health

## 2016-12-13 DIAGNOSIS — E119 Type 2 diabetes mellitus without complications: Secondary | ICD-10-CM

## 2016-12-13 MED ORDER — METFORMIN HCL 1000 MG PO TABS: 1000.0000 mg | ORAL_TABLET | Freq: Two times a day (BID) | ORAL | 0 refills | Status: DC

## 2017-03-12 ENCOUNTER — Other Ambulatory Visit: Payer: Self-pay | Admitting: Adult Health

## 2017-04-06 ENCOUNTER — Encounter: Payer: Self-pay | Admitting: Adult Health

## 2017-04-06 ENCOUNTER — Ambulatory Visit: Payer: Medicare HMO

## 2017-04-06 ENCOUNTER — Ambulatory Visit (INDEPENDENT_AMBULATORY_CARE_PROVIDER_SITE_OTHER): Payer: Medicare HMO | Admitting: Adult Health

## 2017-04-06 VITALS — BP 139/67 | HR 64 | Ht 64.0 in | Wt 164.4 lb

## 2017-04-06 DIAGNOSIS — M25422 Effusion, left elbow: Secondary | ICD-10-CM

## 2017-04-06 DIAGNOSIS — E089 Diabetes mellitus due to underlying condition without complications: Secondary | ICD-10-CM | POA: Diagnosis not present

## 2017-04-06 DIAGNOSIS — Z0000 Encounter for general adult medical examination without abnormal findings: Secondary | ICD-10-CM | POA: Diagnosis not present

## 2017-04-06 DIAGNOSIS — F0390 Unspecified dementia without behavioral disturbance: Secondary | ICD-10-CM | POA: Insufficient documentation

## 2017-04-06 DIAGNOSIS — F02818 Dementia in other diseases classified elsewhere, unspecified severity, with other behavioral disturbance: Secondary | ICD-10-CM

## 2017-04-06 DIAGNOSIS — R5383 Other fatigue: Secondary | ICD-10-CM

## 2017-04-06 DIAGNOSIS — Z23 Encounter for immunization: Secondary | ICD-10-CM | POA: Diagnosis not present

## 2017-04-06 DIAGNOSIS — F0281 Dementia in other diseases classified elsewhere with behavioral disturbance: Secondary | ICD-10-CM | POA: Diagnosis not present

## 2017-04-06 DIAGNOSIS — IMO0001 Reserved for inherently not codable concepts without codable children: Secondary | ICD-10-CM

## 2017-04-06 DIAGNOSIS — G3109 Other frontotemporal dementia: Secondary | ICD-10-CM

## 2017-04-06 DIAGNOSIS — I10 Essential (primary) hypertension: Secondary | ICD-10-CM | POA: Diagnosis not present

## 2017-04-06 DIAGNOSIS — Z794 Long term (current) use of insulin: Secondary | ICD-10-CM

## 2017-04-06 DIAGNOSIS — Z136 Encounter for screening for cardiovascular disorders: Secondary | ICD-10-CM | POA: Insufficient documentation

## 2017-04-06 DIAGNOSIS — I152 Hypertension secondary to endocrine disorders: Secondary | ICD-10-CM | POA: Insufficient documentation

## 2017-04-06 DIAGNOSIS — E1159 Type 2 diabetes mellitus with other circulatory complications: Secondary | ICD-10-CM | POA: Insufficient documentation

## 2017-04-06 DIAGNOSIS — E119 Type 2 diabetes mellitus without complications: Secondary | ICD-10-CM

## 2017-04-06 DIAGNOSIS — F0280 Dementia in other diseases classified elsewhere without behavioral disturbance: Secondary | ICD-10-CM | POA: Insufficient documentation

## 2017-04-06 LAB — POCT UA - MICROALBUMIN
Creatinine, POC: 50 mg/dL
Microalbumin Ur, POC: 30 mg/L

## 2017-04-06 LAB — POCT GLYCOSYLATED HEMOGLOBIN (HGB A1C): HEMOGLOBIN A1C: 7.7

## 2017-04-06 MED ORDER — METFORMIN HCL 1000 MG PO TABS: 1000.0000 mg | ORAL_TABLET | Freq: Two times a day (BID) | ORAL | 3 refills | Status: DC

## 2017-04-06 MED ORDER — LISINOPRIL 10 MG PO TABS: 10.0000 mg | ORAL_TABLET | Freq: Every day | ORAL | 3 refills | Status: DC

## 2017-04-06 MED ORDER — SITAGLIPTIN PHOSPHATE 100 MG PO TABS: 100.0000 mg | ORAL_TABLET | Freq: Every day | ORAL | 3 refills | Status: DC

## 2017-04-06 MED ORDER — BASAGLAR KWIKPEN 100 UNIT/ML ~~LOC~~ SOPN: 19.0000 [IU] | PEN_INJECTOR | Freq: Every day | SUBCUTANEOUS | 3 refills | Status: DC

## 2017-04-06 NOTE — Assessment & Plan Note (Signed)
A1c today 7.7 Abnormal microalbumin Discussed the need to control BS and BP to protect kidney fx. Continue all diabetic medications as directed, refills provided. He denies episodes of hypoglycemia.
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
## 2017-04-06 NOTE — Assessment & Plan Note (Signed)
Xray completed. Apply ice to elbow for 20 mins several times daily.

## 2017-04-06 NOTE — Assessment & Plan Note (Signed)
Extensive labs obtained today

## 2017-04-06 NOTE — Assessment & Plan Note (Signed)
BP at goal 139/67, HR 64 Continue Lisinopril 10mg  daily STOP tobacco use, he declined smoking cessation today.

## 2017-04-06 NOTE — Patient Instructions (Signed)
Diabetes Mellitus and Exercise Exercising regularly is important for your overall health, especially when you have diabetes (diabetes mellitus). Exercising is not only about losing weight. It has many health benefits, such as increasing muscle strength and bone density and reducing body fat and stress. This leads to improved fitness, flexibility, and endurance, all of which result in better overall health. Exercise has additional benefits for people with diabetes, including:  Reducing appetite.  Helping to lower and control blood glucose.  Lowering blood pressure.  Helping to control amounts of fatty substances (lipids) in the blood, such as cholesterol and triglycerides.  Helping the body to respond better to insulin (improving insulin sensitivity).  Reducing how much insulin the body needs.  Decreasing the risk for heart disease by: ? Lowering cholesterol and triglyceride levels. ? Increasing the levels of good cholesterol. ? Lowering blood glucose levels.  What is my activity plan? Your health care provider or certified diabetes educator can help you make a plan for the type and frequency of exercise (activity plan) that works for you. Make sure that you:  Do at least 150 minutes of moderate-intensity or vigorous-intensity exercise each week. This could be brisk walking, biking, or water aerobics. ? Do stretching and strength exercises, such as yoga or weightlifting, at least 2 times a week. ? Spread out your activity over at least 3 days of the week.  Get some form of physical activity every day. ? Do not go more than 2 days in a row without some kind of physical activity. ? Avoid being inactive for more than 90 minutes at a time. Take frequent breaks to walk or stretch.  Choose a type of exercise or activity that you enjoy, and set realistic goals.  Start slowly, and gradually increase the intensity of your exercise over time.  What do I need to know about managing my  diabetes?  Check your blood glucose before and after exercising. ? If your blood glucose is higher than 240 mg/dL (13.3 mmol/L) before you exercise, check your urine for ketones. If you have ketones in your urine, do not exercise until your blood glucose returns to normal.  Know the symptoms of low blood glucose (hypoglycemia) and how to treat it. Your risk for hypoglycemia increases during and after exercise. Common symptoms of hypoglycemia can include: ? Hunger. ? Anxiety. ? Sweating and feeling clammy. ? Confusion. ? Dizziness or feeling light-headed. ? Increased heart rate or palpitations. ? Blurry vision. ? Tingling or numbness around the mouth, lips, or tongue. ? Tremors or shakes. ? Irritability.  Keep a rapid-acting carbohydrate snack available before, during, and after exercise to help prevent or treat hypoglycemia.  Avoid injecting insulin into areas of the body that are going to be exercised. For example, avoid injecting insulin into: ? The arms, when playing tennis. ? The legs, when jogging.  Keep records of your exercise habits. Doing this can help you and your health care provider adjust your diabetes management plan as needed. Write down: ? Food that you eat before and after you exercise. ? Blood glucose levels before and after you exercise. ? The type and amount of exercise you have done. ? When your insulin is expected to peak, if you use insulin. Avoid exercising at times when your insulin is peaking.  When you start a new exercise or activity, work with your health care provider to make sure the activity is safe for you, and to adjust your insulin, medicines, or food intake as needed.    Drink plenty of water while you exercise to prevent dehydration or heat stroke. Drink enough fluid to keep your urine clear or pale yellow. This information is not intended to replace advice given to you by your health care provider. Make sure you discuss any questions you have with  your health care provider. Document Released: 08/06/2003 Document Revised: 12/04/2015 Document Reviewed: 10/26/2015 Elsevier Interactive Patient Education  2018 [place].  Blood Glucose Monitoring, Adult Monitoring your blood sugar (glucose) helps you manage your diabetes. It also helps you and your health care provider determine how well your diabetes management plan is working. Blood glucose monitoring involves checking your blood glucose as often as directed, and keeping a record (log) of your results over time. Why should I monitor my blood glucose? Checking your blood glucose regularly can:  Help you understand how food, exercise, illnesses, and medicines affect your blood glucose.  Let you know what your blood glucose is at any time. You can quickly tell if you are having low blood glucose (hypoglycemia) or high blood glucose (hyperglycemia).  Help you and your health care provider adjust your medicines as needed.  When should I check my blood glucose? Follow instructions from your health care provider about how often to check your blood glucose. This may depend on:  The type of diabetes you have.  How well-controlled your diabetes is.  Medicines you are taking.  If you have type 1 diabetes:  Check your blood glucose at least 2 times a day.  Also check your blood glucose: ? Before every insulin injection. ? Before and after exercise. ? Between meals. ? 2 hours after a meal. ? Occasionally between 2:00 a.m. and 3:00 a.m., as directed. ? Before potentially dangerous tasks, like driving or using heavy machinery. ? At bedtime.  You may need to check your blood glucose more often, up to 6-10 times a day: ? If you use an insulin pump. ? If you need multiple daily injections (MDI). ? If your diabetes is not well-controlled. ? If you are ill. ? If you have a history of severe hypoglycemia. ? If you have a history of not knowing when your blood glucose is getting low  (hypoglycemia unawareness). If you have type 2 diabetes:  If you take insulin or other diabetes medicines, check your blood glucose at least 2 times a day.  If you are on intensive insulin therapy, check your blood glucose at least 4 times a day. Occasionally, you may also need to check between 2:00 a.m. and 3:00 a.m., as directed.  Also check your blood glucose: ? Before and after exercise. ? Before potentially dangerous tasks, like driving or using heavy machinery.  You may need to check your blood glucose more often if: ? Your medicine is being adjusted. ? Your diabetes is not well-controlled. ? You are ill. What is a blood glucose log?  A blood glucose log is a record of your blood glucose readings. It helps you and your health care provider: ? Look for patterns in your blood glucose over time. ? Adjust your diabetes management plan as needed.  Every time you check your blood glucose, write down your result and notes about things that may be affecting your blood glucose, such as your diet and exercise for the day.  Most glucose meters store a record of glucose readings in the meter. Some meters allow you to download your records to a computer. How do I check my blood glucose? Follow these steps to get  accurate readings of your blood glucose: Supplies needed   Blood glucose meter.  Test strips for your meter. Each meter has its own strips. You must use the strips that come with your meter.  A needle to prick your finger (lancet). Do not use lancets more than once.  A device that holds the lancet (lancing device).  A journal or log book to write down your results. Procedure  Wash your hands with soap and water.  Prick the side of your finger (not the tip) with the lancet. Use a different finger each time.  Gently rub the finger until a small drop of blood appears.  Follow instructions that come with your meter for inserting the test strip, applying blood to the strip,  and using your blood glucose meter.  Write down your result and any notes. Alternative testing sites  Some meters allow you to use areas of your body other than your finger (alternative sites) to test your blood.  If you think you may have hypoglycemia, or if you have hypoglycemia unawareness, do not use alternative sites. Use your finger instead.  Alternative sites may not be as accurate as the fingers, because blood flow is slower in these areas. This means that the result you get may be delayed, and it may be different from the result that you would get from your finger.  The most common alternative sites are: ? Forearm. ? Thigh. ? Palm of the hand. Additional tips  Always keep your supplies with you.  If you have questions or need help, all blood glucose meters have a 24-hour "hotline" number that you can call. You may also contact your health care provider.  After you use a few boxes of test strips, adjust (calibrate) your blood glucose meter by following instructions that came with your meter. This information is not intended to replace advice given to you by your health care provider. Make sure you discuss any questions you have with your health care provider. Document Released: 05/19/2003 Document Revised: 12/04/2015 Document Reviewed: 10/26/2015 Elsevier Interactive Patient Education  2017 [employer]   Hypertension Hypertension, commonly called high blood pressure, is when the force of blood pumping through the arteries is too strong. The arteries are the blood vessels that carry blood from the heart throughout the body. Hypertension forces the heart to work harder to pump blood and may cause arteries to become narrow or stiff. Having untreated or uncontrolled hypertension can cause heart attacks, strokes, kidney disease, and other problems. A blood pressure reading consists of a higher number over a lower number. Ideally, your blood pressure should be below 120/80. The  first ("top") number is called the systolic pressure. It is a measure of the pressure in your arteries as your heart beats. The second ("bottom") number is called the diastolic pressure. It is a measure of the pressure in your arteries as the heart relaxes. What are the causes? The cause of this condition is not known. What increases the risk? Some risk factors for high blood pressure are under your control. Others are not. Factors you can change Smoking. Having type 2 diabetes mellitus, high cholesterol, or both. Not getting enough exercise or physical activity. Being overweight. Having too much fat, sugar, calories, or salt (sodium) in your diet. Drinking too much alcohol. Factors that are difficult or impossible to change Having chronic kidney disease. Having a family history of high blood pressure. Age. Risk increases with age. Race. You may be at higher risk if you  are African-American. Gender. Men are at higher risk than women before age 33. After age 36, women are at higher risk than men. Having obstructive sleep apnea. Stress. What are the signs or symptoms? Extremely high blood pressure (hypertensive crisis) may cause: Headache. Anxiety. Shortness of breath. Nosebleed. Nausea and vomiting. Severe chest pain. Jerky movements you cannot control (seizures).  How is this diagnosed? This condition is diagnosed by measuring your blood pressure while you are seated, with your arm resting on a surface. The cuff of the blood pressure monitor will be placed directly against the skin of your upper arm at the level of your heart. It should be measured at least twice using the same arm. Certain conditions can cause a difference in blood pressure between your right and left arms. Certain factors can cause blood pressure readings to be lower or higher than normal (elevated) for a short period of time: When your blood pressure is higher when you are in a health care provider's office than  when you are at home, this is called white coat hypertension. Most people with this condition do not need medicines. When your blood pressure is higher at home than when you are in a health care provider's office, this is called masked hypertension. Most people with this condition may need medicines to control blood pressure.  If you have a high blood pressure reading during one visit or you have normal blood pressure with other risk factors: You may be asked to return on a different day to have your blood pressure checked again. You may be asked to monitor your blood pressure at home for 1 week or longer.  If you are diagnosed with hypertension, you may have other blood or imaging tests to help your health care provider understand your overall risk for other conditions. How is this treated? This condition is treated by making healthy lifestyle changes, such as eating healthy foods, exercising more, and reducing your alcohol intake. Your health care provider may prescribe medicine if lifestyle changes are not enough to get your blood pressure under control, and if: Your systolic blood pressure is above 130. Your diastolic blood pressure is above 80.  Your personal target blood pressure may vary depending on your medical conditions, your age, and other factors. Follow these instructions at home: Eating and drinking Eat a diet that is high in fiber and potassium, and low in sodium, added sugar, and fat. An example eating plan is called the DASH (Dietary Approaches to Stop Hypertension) diet. To eat this way: Eat plenty of fresh fruits and vegetables. Try to fill half of your plate at each meal with fruits and vegetables. Eat whole grains, such as whole wheat pasta, brown rice, or whole grain bread. Fill about one quarter of your plate with whole grains. Eat or drink low-fat dairy products, such as skim milk or low-fat yogurt. Avoid fatty cuts of meat, processed or cured meats, and poultry with  skin. Fill about one quarter of your plate with lean proteins, such as fish, chicken without skin, beans, eggs, and tofu. Avoid premade and processed foods. These tend to be higher in sodium, added sugar, and fat. Reduce your daily sodium intake. Most people with hypertension should eat less than 1,500 mg of sodium a day. Limit alcohol intake to no more than 1 drink a day for nonpregnant women and 2 drinks a day for men. One drink equals 12 oz of beer, 5 oz of wine, or 1 oz of hard liquor. Lifestyle  Work with your health care provider to maintain a healthy body weight or to lose weight. Ask what an ideal weight is for you. Get at least 30 minutes of exercise that causes your heart to beat faster (aerobic exercise) most days of the week. Activities may include walking, swimming, or biking. Include exercise to strengthen your muscles (resistance exercise), such as pilates or lifting weights, as part of your weekly exercise routine. Try to do these types of exercises for 30 minutes at least 3 days a week. Do not use any products that contain nicotine or tobacco, such as cigarettes and e-cigarettes. If you need help quitting, ask your health care provider. Monitor your blood pressure at home as told by your health care provider. Keep all follow-up visits as told by your health care provider. This is important. Medicines Take over-the-counter and prescription medicines only as told by your health care provider. Follow directions carefully. Blood pressure medicines must be taken as prescribed. Do not skip doses of blood pressure medicine. Doing this puts you at risk for problems and can make the medicine less effective. Ask your health care provider about side effects or reactions to medicines that you should watch for. Contact a health care provider if: You think you are having a reaction to a medicine you are taking. You have headaches that keep coming back (recurring). You feel dizzy. You have  swelling in your ankles. You have trouble with your vision. Get help right away if: You develop a severe headache or confusion. You have unusual weakness or numbness. You feel faint. You have severe pain in your chest or abdomen. You vomit repeatedly. You have trouble breathing. Summary Hypertension is when the force of blood pumping through your arteries is too strong. If this condition is not controlled, it may put you at risk for serious complications. Your personal target blood pressure may vary depending on your medical conditions, your age, and other factors. For most people, a normal blood pressure is less than 120/80. Hypertension is treated with lifestyle changes, medicines, or a combination of both. Lifestyle changes include weight loss, eating a healthy, low-sodium diet, exercising more, and limiting alcohol. This information is not intended to replace advice given to you by your health care provider. Make sure you discuss any questions you have with your health care provider. Document Released: 05/16/2005 Document Revised: 04/13/2016 Document Reviewed: 04/13/2016 Elsevier Interactive Patient Education  2018 [employer].   Tobacco Use Disorder Tobacco use disorder (TUD) is a mental disorder. It is the long-term use of tobacco in spite of related health problems or difficulty with normal life activities. Tobacco is most commonly smoked as cigarettes and less commonly as cigars or pipes. Smokeless chewing tobacco and snuff are also popular. People with TUD get a feeling of extreme pleasure (euphoria) from using tobacco and have a desire to use it again and again. Repeated use of tobacco can cause problems. The addictive effects of tobacco are due mainly tothe ingredient nicotine. Nicotine also causes a rush of adrenaline (epinephrine) in the body. This leads to increased blood pressure, heart rate, and breathing rate. These changes may cause problems for people with high blood  pressure, weak hearts, or lung disease. High doses of nicotine in children and pets can lead to seizures and death. Tobacco contains a number of other unsafe chemicals. These chemicals are especially harmful when inhaled as smoke and can damage almost every organ in the body. Smokers live shorter lives than nonsmokers and are at  risk of dying from a number of diseases and cancers. Tobacco smoke can also cause health problems for nonsmokers (due to inhaling secondhand smoke). Smoking is also a fire hazard. TUD usually starts in the late teenage years and is most common in young adults between the ages of 57 and 71 years. People who start smoking earlier in life are more likely to continue smoking as adults. TUD is somewhat more common in men than women. People with TUD are at higher risk for using alcohol and other drugs of abuse. What increases the risk? Risk factors for TUD include:  Having family members with the disorder.  Being around people who use tobacco.  Having an existing mental health issue such as schizophrenia, depression, bipolar disorder, ADHD, or posttraumatic stress disorder (PTSD).  What are the signs or symptoms? People with tobacco use disorder have two or more of the following signs and symptoms within 12 months:  Use of more tobacco over a longer period than intended.  Not able to cut down or control tobacco use.  A lot of time spent obtaining or using tobacco.  Strong desire or urge to use tobacco (craving). Cravings may last for 6 months or longer after quitting.  Use of tobacco even when use leads to major problems at work, school, or home.  Use of tobacco even when use leads to relationship problems.  Giving up or cutting down on important life activities because of tobacco use.  Repeatedly using tobacco in situations where it puts you or others in physical danger, like smoking in bed.  Use of tobacco even when it is known that a physical or mental problem is  likely related to tobacco use. ? Physical problems are numerous and may include chronic bronchitis, emphysema, lung and other cancers, gum disease, high blood pressure, heart disease, and stroke. ? Mental problems caused by tobacco may include difficulty sleeping and anxiety.  Need to use greater amounts of tobacco to get the same effect. This means you have developed a tolerance.  Withdrawal symptoms as a result of stopping or rapidly cutting back use. These symptoms may last a month or more after quitting and include the following: ? Depressed, anxious, or irritable mood. ? Difficulty concentrating. ? Increased appetite. ? Restlessness or trouble sleeping. ? Use of tobacco to avoid withdrawal symptoms.  How is this diagnosed? Tobacco use disorder is diagnosed by your health care provider. A diagnosis may be made by:  Your health care provider asking questions about your tobacco use and any problems it may be causing.  A physical exam.  Lab tests.  You may be referred to a mental health professional or addiction specialist.  The severity of tobacco use disorder depends on the number of signs and symptoms you have:  Mild-Two or three symptoms.  Moderate-Four or five symptoms.  Severe-Six or more symptoms.  How is this treated? Many people with tobacco use disorder are unable to quit on their own and need help. Treatment options include the following:  Nicotine replacement therapy (NRT). NRT provides nicotine without the other harmful chemicals in tobacco. NRT gradually lowers the dosage of nicotine in the body and reduces withdrawal symptoms. NRT is available in over-the-counter forms (gum, lozenges, and skin patches) as well as prescription forms (mouth inhaler and nasal spray).  Medicines.This may include: ? Antidepressant medicine that may reduce nicotine cravings. ? A medicine that acts on nicotine receptors in the brain to reduce cravings and withdrawal symptoms. It may  also block  the effects of tobacco in people with TUD who relapse.  Counseling or talk therapy. A form of talk therapy called behavioral therapy is commonly used to treat people with TUD. Behavioral therapy looks at triggers for tobacco use, how to avoid them, and how to cope with cravings. It is most effective in person or by phone but is also available in self-help forms (books and Internet websites).  Support groups. These provide emotional support, advice, and guidance for quitting tobacco.  The most effective treatment for TUD is usually a combination of medicine, talk therapy, and support groups. Follow these instructions at home:  Keep all follow-up visits as directed by your health care provider. This is important.  Take medicines only as directed by your health care provider.  Check with your health care provider before starting new prescription or over-the-counter medicines. Contact a health care provider if:  You are not able to take your medicines as prescribed.  Treatment is not helping your TUD and your symptoms get worse. Get help right away if:  You have serious thoughts about hurting yourself or others.  You have trouble breathing, chest pain, sudden weakness, or sudden numbness in part of your body. This information is not intended to replace advice given to you by your health care provider. Make sure you discuss any questions you have with your health care provider. Document Released: 01/20/2004 Document Revised: 01/17/2016 Document Reviewed: 07/12/2013 Elsevier Interactive Patient Education  2018 [place] all medications as directed. Please monitor your blood sugar at home, at least 3 days/week. We will all when your lab results are available. Increase water intake, strive for at least 80 ounces/day.   Follow Heart Healthy diet Increase regular exercise.  Recommend at least 30 minutes daily, 5 days per week of walking, jogging, biking, swimming,  YouTube/Pinterest workout videos. Please schedule full physical in 3 months. WELCOME TO THE PRACTICE!

## 2017-04-06 NOTE — Assessment & Plan Note (Signed)
Increase water intake, strive for at least 80 ounces/day.   Follow Heart Healthy diet Increase regular exercise.  Recommend at least 30 minutes daily, 5 days per week of walking, jogging, biking, swimming, YouTube/Pinterest workout videos. Extensive labs obtained today. CPE in 3 months. Continue with Neurology as directed.

## 2017-04-06 NOTE — Assessment & Plan Note (Signed)
Followed by [facility] Q6 months Continue medications as directed. Mood/Behavior stable, he is safe at home.

## 2017-04-07 LAB — CBC
Hematocrit: 39.8 % (ref 37.5–51.0)
Hemoglobin: 13.2 g/dL (ref 13.0–17.7)
MCH: 30.6 pg (ref 26.6–33.0)
MCHC: 33.2 g/dL (ref 31.5–35.7)
MCV: 92 fL (ref 79–97)
Platelets: 265 10*3/uL (ref 150–379)
RBC: 4.32 x10E6/uL (ref 4.14–5.80)
RDW: 14.2 % (ref 12.3–15.4)
WBC: 6.6 10*3/uL (ref 3.4–10.8)

## 2017-04-07 LAB — TSH: TSH: 1.1 u[IU]/mL (ref 0.450–4.500)

## 2017-04-07 LAB — LIPID PANEL
CHOLESTEROL TOTAL: 162 mg/dL (ref 100–199)
Chol/HDL Ratio: 2.8 ratio (ref 0.0–5.0)
HDL: 57 mg/dL (ref >39)
LDL CALC: 91 mg/dL (ref 0–99)
Triglycerides: 72 mg/dL (ref 0–149)
VLDL Cholesterol Cal: 14 mg/dL (ref 5–40)

## 2017-04-07 LAB — RPR: RPR: NONREACTIVE

## 2017-04-07 LAB — FOLATE: Folate: >20 ng/mL (ref >3.0)

## 2017-04-07 LAB — VITAMIN B12: VITAMIN B 12: 1123 pg/mL (ref 232–1245)

## 2017-04-07 LAB — SEDIMENTATION RATE: Sed Rate: 6 mm/hr (ref 0–30)

## 2017-04-07 LAB — T4, FREE: FREE T4: 1.14 ng/dL (ref 0.82–1.77)

## 2017-04-07 LAB — VITAMIN D 25 HYDROXY (VIT D DEFICIENCY, FRACTURES): Vit D, 25-Hydroxy: 31 ng/mL (ref 30.0–100.0)

## 2017-04-13 LAB — COMPREHENSIVE METABOLIC PANEL
ALBUMIN: 4.9 g/dL — AB (ref 3.5–4.8)
ALT: 24 IU/L (ref 0–44)
AST: 19 IU/L (ref 0–40)
Albumin/Globulin Ratio: 2.1 (ref 1.2–2.2)
Alkaline Phosphatase: 53 IU/L (ref 39–117)
BUN / CREAT RATIO: 24 (ref 10–24)
BUN: 17 mg/dL (ref 8–27)
Bilirubin Total: <0.2 mg/dL (ref 0.0–1.2)
CALCIUM: 9.6 mg/dL (ref 8.6–10.2)
CHLORIDE: 100 mmol/L (ref 96–106)
CO2: 20 mmol/L (ref 20–29)
Creatinine, Ser: 0.72 mg/dL — ABNORMAL LOW (ref 0.76–1.27)
GFR, EST AFRICAN AMERICAN: 107 mL/min/{1.73_m2} (ref >59)
GFR, EST NON AFRICAN AMERICAN: 93 mL/min/{1.73_m2} (ref >59)
GLOBULIN, TOTAL: 2.3 g/dL (ref 1.5–4.5)
Glucose: 131 mg/dL — ABNORMAL HIGH (ref 65–99)
Potassium: 4.9 mmol/L (ref 3.5–5.2)
Sodium: 139 mmol/L (ref 134–144)
Total Protein: 7.2 g/dL (ref 6.0–8.5)

## 2017-04-13 LAB — SPECIMEN STATUS REPORT

## 2017-04-27 ENCOUNTER — Other Ambulatory Visit: Payer: Self-pay

## 2017-04-27 ENCOUNTER — Other Ambulatory Visit: Payer: Self-pay | Admitting: Adult Health

## 2017-04-27 DIAGNOSIS — E089 Diabetes mellitus due to underlying condition without complications: Secondary | ICD-10-CM

## 2017-04-27 DIAGNOSIS — Z794 Long term (current) use of insulin: Principal | ICD-10-CM

## 2017-04-27 MED ORDER — BASAGLAR KWIKPEN 100 UNIT/ML ~~LOC~~ SOPN: 19.0000 [IU] | PEN_INJECTOR | Freq: Every day | SUBCUTANEOUS | 3 refills | Status: DC

## 2017-04-27 MED ORDER — INSULIN GLARGINE 100 UNIT/ML SOLOSTAR PEN: 19.0000 [IU] | PEN_INJECTOR | Freq: Every day | SUBCUTANEOUS | 1 refills | Status: DC

## 2017-04-27 NOTE — Progress Notes (Signed)
Received fax from [facility] requesting to change Basaglar to Lantus, Levemir, or [place] for insurance coverage.  RX faxed to [facility] for Lantus solostar 198 units #15 pens with 1 refill.  Charyl Bigger, CMA

## 2017-04-28 ENCOUNTER — Ambulatory Visit (INDEPENDENT_AMBULATORY_CARE_PROVIDER_SITE_OTHER): Payer: Medicare HMO | Admitting: Family Medicine

## 2017-04-28 ENCOUNTER — Ambulatory Visit: Payer: Medicare HMO

## 2017-04-28 ENCOUNTER — Encounter: Payer: Self-pay | Admitting: Family Medicine

## 2017-04-28 VITALS — BP 138/70 | HR 75

## 2017-04-28 DIAGNOSIS — E559 Vitamin D deficiency, unspecified: Secondary | ICD-10-CM | POA: Diagnosis not present

## 2017-04-28 DIAGNOSIS — E1129 Type 2 diabetes mellitus with other diabetic kidney complication: Secondary | ICD-10-CM | POA: Diagnosis not present

## 2017-04-28 DIAGNOSIS — R809 Proteinuria, unspecified: Secondary | ICD-10-CM

## 2017-04-28 DIAGNOSIS — E089 Diabetes mellitus due to underlying condition without complications: Secondary | ICD-10-CM | POA: Diagnosis not present

## 2017-04-28 DIAGNOSIS — S2232XA Fracture of one rib, left side, initial encounter for closed fracture: Secondary | ICD-10-CM | POA: Insufficient documentation

## 2017-04-28 DIAGNOSIS — R0781 Pleurodynia: Secondary | ICD-10-CM

## 2017-04-28 DIAGNOSIS — Z794 Long term (current) use of insulin: Secondary | ICD-10-CM

## 2017-04-28 MED ORDER — VITAMIN D (ERGOCALCIFEROL) 1.25 MG (50000 UNIT) PO CAPS: 50000.0000 [IU] | ORAL_CAPSULE | ORAL | 12 refills | Status: DC

## 2017-04-28 MED ORDER — INSULIN GLARGINE 100 UNIT/ML SOLOSTAR PEN: 22.0000 [IU] | PEN_INJECTOR | Freq: Every day | SUBCUTANEOUS | 3 refills | Status: DC

## 2017-04-28 NOTE — Patient Instructions (Addendum)
Rib Fracture A rib fracture is a break or crack in one of the bones of the ribs. The ribs are a group of long, curved bones that wrap around your chest and attach to your spine. They protect your lungs and other organs in the chest cavity. A broken or cracked rib is often painful, but most do not cause other problems. Most rib fractures heal on their own over time. However, rib fractures can be more serious if multiple ribs are broken or if broken ribs move out of place and push against other structures. What are the causes?  A direct blow to the chest. For example, this could happen during contact sports, a car accident, or a fall against a hard object.  Repetitive movements with high force, such as pitching a baseball or having severe coughing spells. What are the signs or symptoms?  Pain when you breathe in or cough.  Pain when someone presses on the injured area. How is this diagnosed? Your caregiver will perform a physical exam. Various imaging tests may be ordered to confirm the diagnosis and to look for related injuries. These tests may include a chest X-ray, computed tomography (CT), magnetic resonance imaging (MRI), or a bone scan. How is this treated? Rib fractures usually heal on their own in 1-3 months. The longer healing period is often associated with a continued cough or other aggravating activities. During the healing period, pain control is very important. Medication is usually given to control pain. Hospitalization or surgery may be needed for more severe injuries, such as those in which multiple ribs are broken or the ribs have moved out of place. Follow these instructions at home:  Avoid strenuous activity and any activities or movements that cause pain. Be careful during activities and avoid bumping the injured rib.  Gradually increase activity as directed by your caregiver.  Only take over-the-counter or prescription medications as directed by your caregiver. Do not  take other medications without asking your caregiver first.  Apply ice to the injured area for the first 1-2 days after you have been treated or as directed by your caregiver. Applying ice helps to reduce inflammation and pain. ? Put ice in a plastic bag. ? Place a towel between your skin and the bag. ? Leave the ice on for 15-20 minutes at a time, every 2 hours while you are awake.  Perform deep breathing as directed by your caregiver. This will help prevent pneumonia, which is a common complication of a broken rib. Your caregiver may instruct you to: ? Take deep breaths several times a day. ? Try to cough several times a day, holding a pillow against the injured area. ? Use a device called an incentive spirometer to practice deep breathing several times a day.  Drink enough fluids to keep your urine clear or pale yellow. This will help you avoid constipation.  Do not wear a rib belt or binder. These restrict breathing, which can lead to pneumonia. Get help right away if:  You have a fever.  You have difficulty breathing or shortness of breath.  You develop a continual cough, or you cough up thick or bloody sputum.  You feel sick to your stomach (nausea), throw up (vomit), or have abdominal pain.  You have worsening pain not controlled with medications. This information is not intended to replace advice given to you by your health care provider. Make sure you discuss any questions you have with your health care provider. Document Released: 05/16/2005  Document Revised: 10/22/2015 Document Reviewed: 07/18/2012 Elsevier Interactive Patient Education  2018 [employer].      Preventing Osteoporosis, Adult Osteoporosis is a condition that causes the bones to get weaker. With osteoporosis, the bones become thinner, and the normal spaces in bone tissue become larger. This can make the bones weak and cause them to break more easily. People who have osteoporosis are more likely to break  their wrist, spine, or hip. Even a minor accident or injury can be enough to break weak bones. Osteoporosis can occur with aging. Your body constantly replaces old bone tissue with new tissue. As you get older, you may lose bone tissue more quickly, or it may be replaced more slowly. Osteoporosis is more likely to develop if you have poor nutrition or do not get enough calcium or vitamin D. Other lifestyle factors can also play a role. By making some diet and lifestyle changes, you can help to keep your bones healthy and help to prevent osteoporosis. What nutrition changes can be made? Nutrition plays an important role in maintaining healthy, strong bones.  Make sure you get enough calcium every day from food or from calcium supplements. ? If you are age 22 or younger, aim to get 1,000 mg of calcium every day. ? If you are older than age 19, aim to get 1,200 mg of calcium every day.  Try to get enough vitamin D every day. ? If you are age 49 or younger, aim to get 600 international units (IU) every day. ? If you are older than age 67, aim to get 800 international units every day.  Follow a healthy diet. Eat plenty of foods that contain calcium and vitamin D. ? Calcium is in milk, cheese, yogurt, and other dairy products. Some fish and vegetables are also good sources of calcium. Many foods such as cereals and breads have had calcium added to them (are fortified). Check nutrition labels to see how much calcium is in a food or drink. ? Foods that contain vitamin D include milk, cereals, salmon, and tuna. Your body also makes vitamin D when you are out in the sun. Bare skin exposure to the sun on your face, arms, legs, or back for no more than 30 minutes a day, 2 times per week is more than enough. Beyond that, it is important to use sunblock to protect your skin from sunburn, which increases your risk for skin cancer.  What lifestyle changes can be made? Making changes in your everyday life can also  play an important role in preventing osteoporosis.  Stay active and get exercise every day. Ask your health care provider what types of exercise are best for you.  Do not use any products that contain nicotine or tobacco, such as cigarettes and e-cigarettes. If you need help quitting, ask your health care provider.  Limit alcohol intake to no more than 1 drink a day for nonpregnant women and 2 drinks a day for men. One drink equals 12 oz of beer, 5 oz of wine, or 1 oz of hard liquor.  Why are these changes important? Making these nutrition and lifestyle changes can:  Help you develop and maintain healthy, strong bones.  Prevent loss of bone mass and the problems that are caused by that loss, such as broken bones and delayed healing.  Make you feel better mentally and physically.  What can happen if changes are not made? Problems that can result from osteoporosis can be very serious. These may include:  A higher risk of broken bones that are painful and do not heal well.  Physical malformations, such as a collapsed spine or a hunched back.  Problems with movement.  Where to find support: If you need help making changes to prevent osteoporosis, talk with your health care provider. You can ask for a referral to a diet and nutrition specialist (dietitian) and a physical therapist. Where to find more information: Learn more about osteoporosis from:  NIH Osteoporosis and Related [place]: www.niams.GolfingGoddess.com.br  [place] Office on Women's Health: SouvenirBaseball.es.html  National Osteoporosis Foundation: ProfilePeek.ch  Summary  Osteoporosis is a condition that causes weak bones that are more likely to break.  Eating a healthy diet and making sure you get enough calcium and vitamin D can help prevent osteoporosis.  Other ways  to reduce your risk of osteoporosis include getting regular exercise and avoiding alcohol and products that contain nicotine or tobacco. This information is not intended to replace advice given to you by your health care provider. Make sure you discuss any questions you have with your health care provider. Document Released: 05/31/2015 Document Revised: 01/25/2016 Document Reviewed: 01/25/2016 Elsevier Interactive Patient Education  2018 [employer].     Nine ways to increase your "good" HDL cholesterol  High-density lipoprotein (HDL) is often referred to as the "good" cholesterol. Having high HDL levels helps carry cholesterol from your arteries to your liver, where it can be used or excreted.  Having high levels of HDL also has antioxidant and anti-inflammatory effects, and is linked to a reduced risk of heart disease (1, 2).  Most health experts recommend minimum blood levels of 40 mg/dl in men and 50 mg/dl in women.  While genetics definitely play a role, there are several other factors that affect HDL levels.  Here are nine healthy ways to raise your "good" HDL cholesterol.  1. Consume olive oil  two pieces of salmon on a plate olive oil being poured into a small dish Extra virgin olive oil may be more healthful than processed olive oils. Olive oil is one of the healthiest fats around.  A large analysis of 42 studies with more than 800,000 participants found that olive oil was the only source of monounsaturated fat that seemed to reduce heart disease risk (3).  Research has shown that one of olive oil's heart-healthy effects is an increase in HDL cholesterol. This effect is thought to be caused by antioxidants it contains called polyphenols (4, 5, 6, 7).  Extra virgin olive oil has more polyphenols than more processed olive oils, although the amount can still vary among different types and brands.  One study gave 200 healthy young men about 2 tablespoons (25 ml) of different  olive oils per day for three weeks.  The researchers found that participants' HDL levels increased significantly more after they consumed the olive oil with the highest polyphenol content (6).  In another study, when 6 older adults consumed about 4 tablespoons (50 ml) of high-polyphenol extra virgin olive oil every day for six weeks, their HDL cholesterol increased by 6.5 mg/dl, on average (7).  In addition to raising HDL levels, olive oil has been found to boost HDL's anti-inflammatory and antioxidant function in studies of older people and individuals with high cholesterol levels ( 7, 8, 9).  Whenever possible, select high-quality, certified extra virgin olive oils, which tend to be highest in polyphenols.  Bottom line: Extra virgin olive oil with a high polyphenol content has been shown to increase  HDL levels in healthy people, the elderly and individuals with high cholesterol.  2. Follow a low-carb or ketogenic diet  Low-carb and ketogenic diets provide a number of health benefits, including weight loss and reduced blood sugar levels.  They have also been shown to increase HDL cholesterol in people who tend to have lower levels.  This includes those who are obese, insulin resistant or diabetic (10, 11, 12, 13, 14, 15, 16, 17).  In one study, people with type 2 diabetes were split into two groups.  One followed a diet consuming less than 50 grams of carbs per day. The other followed a high-carb diet.  Although both groups lost weight, the low-carb group's HDL cholesterol increased almost twice as much as the high-carb group's did (14).  In another study, obese people who followed a low-carb diet experienced an increase in HDL cholesterol of 5 mg/dl overall.  Meanwhile, in the same study, the participants who ate a low-fat, high-carb diet showed a decrease in HDL cholesterol (15).  This response may partially be due to the higher levels of fat people typically consume on low-carb  diets.  One study in overweight women found that diets high in meat and cheese increased HDL levels by 5-8%, compared to a higher-carb diet (18).  What's more, in addition to raising HDL cholesterol, very-low-carb diets have been shown to decrease triglycerides and improve several other risk factors for heart disease (13, 14, 16, 17).  Bottom line: Low-carb and ketogenic diets typically increase HDL cholesterol levels in people with diabetes, metabolic syndrome and obesity.  3. Exercise regularly  Being physically active is important for heart health.  Studies have shown that many different types of exercise are effective at raising HDL cholesterol, including strength training, high-intensity exercise and aerobic exercise (19, 20, 21, 22, 23, 24).  However, the biggest increases in HDL are typically seen with high-intensity exercise.  One small study followed women who were living with polycystic ovary syndrome (PCOS), which is linked to a higher risk of insulin resistance. The study required them to perform high-intensity exercise three times a week.  The exercise led to an increase in HDL cholesterol of 8 mg/dL after 10 weeks. The women also showed improvements in other health markers, including decreased insulin resistance and improved arterial function (23).  In a 12-week study, overweight men who performed high-intensity exercise experienced a 10% increase in HDL cholesterol.  In contrast, the low-intensity exercise group showed only a 2% increase and the endurance training group experienced no change (24).  However, even lower-intensity exercise seems to increase HDL's anti-inflammatory and antioxidant capacities, whether or not HDL levels change (20, 21, 25).  Overall, high-intensity exercise such as high-intensity interval training (HIIT) and high-intensity circuit training (HICT) may boost HDL cholesterol levels the most.  Bottom line: Exercising several times per week can help  raise HDL cholesterol and enhance its anti-inflammatory and antioxidant effects. High-intensity forms of exercise may be especially effective.  4. Add coconut oil to your diet  Studies have shown that coconut oil may reduce appetite, increase metabolic rate and help protect brain health, among other benefits.  Some people may be concerned about coconut oil's effects on heart health due to its high saturated fat content.  However, it appears that coconut oil is actually quite heart healthy.  Coconut oil tends to raise HDL cholesterol more than many other types of fat.  In addition, it may improve the ratio of low-density-lipoprotein (LDL) cholesterol, the "bad" cholesterol, to HDL  cholesterol. Improving this ratio reduces heart disease risk (26, 27, 28, 29).  One study examined the health effects of coconut oil on 57 women with excess belly fat. The researchers found that participants who took coconut oil daily experienced increased HDL cholesterol and a lower LDL-to-HDL ratio.  In contrast, the group who took soybean oil daily had a decrease in HDL cholesterol and an increase in the LDL-to-HDL ratio (29).  Most studies have found these health benefits occur at a dosage of about 2 tablespoons (30 ml) of coconut oil per day. It's best to incorporate this into cooking rather than eating spoonfuls of coconut oil on their own.  Bottom line: Consuming 2 tablespoons (30 ml) of coconut oil per day may help increase HDL cholesterol levels.  5. Stop smoking  cigarette butt Quitting smoking can reduce the risk of heart disease and lung cancer. Smoking increases the risk of many health problems, including heart disease and lung cancer (30).  One of its negative effects is a suppression of HDL cholesterol.  Some studies have found that quitting smoking can increase HDL levels. Indeed, one study found no significant differences in HDL levels between former smokers and people who had never smoked (31,  32, 33, 34, 35).  In a one-year study of more than 1,500 people, those who quit smoking had twice the increase in HDL as those who resumed smoking within the year. The number of large HDL particles also increased, which further reduced heart disease risk (32).  One study followed smokers who switched from traditional cigarettes to electronic cigarettes for one year. They found that the switch was associated with an increase in HDL cholesterol of 5 mg/dl, on average (33).  When it comes to the effect of nicotine replacement patches on HDL levels, research results have been mixed.  One study found that nicotine replacement therapy led to higher HDL cholesterol. However, other research suggests that people who use nicotine patches likely won't see increases in HDL levels until after replacement therapy is completed (34, 36).  Even in studies where HDL cholesterol levels didn't increase after people quit smoking, HDL function improved, resulting in less inflammation and other beneficial effects on heart health (37).  Bottom line: Quitting smoking can increase HDL levels, improve HDL function and help protect heart health.  6. Lose weight  When overweight and obese people lose weight, their HDL cholesterol levels usually increase.  What's more, this benefit seems to occur whether weight loss is achieved by calorie counting, carb restriction, intermittent fasting, weight loss surgery or a combination of diet and exercise (16, 38, 39, 40, 41, 42).  One study examined HDL levels in more than 3,000 overweight and obese [place] adults who followed a lifestyle modification program for one year.  The researchers found that losing at least 6.6 lbs (3 kg) led to an increase in HDL cholesterol of 4 mg/dl, on average (41).  In another study, when obese people with type 2 diabetes consumed calorie-restricted diets that provided 20-30% of calories from protein, they experienced significant increases in HDL  cholesterol levels (42).  The key to achieving and maintaining healthy HDL cholesterol levels is choosing the type of diet that makes it easiest for you to lose weight and keep it off.  Bottom Line: Several methods of weight loss have been shown to increase HDL cholesterol levels in people who are overweight or obese.  7. Choose purple produce  Consuming purple-colored fruits and vegetables is a delicious way to potentially increase  HDL cholesterol.  Purple produce contains antioxidants known as anthocyanins.  Studies using anthocyanin extracts have shown that they help fight inflammation, protect your cells from damaging free radicals and may also raise HDL cholesterol levels (43, 44, 45, 46).  In a 24-week study of 61 people with diabetes, those who took an anthocyanin supplement twice a day experienced a 19% increase in HDL cholesterol, on average, along with other improvements in heart health markers (45).  In another study, when people with cholesterol issues took anthocyanin extract for 12 weeks, their HDL cholesterol levels increased by 13.7% (46).  Although these studies used extracts instead of foods, there are several fruits and vegetables that are very high in anthocyanins. These include eggplant, purple corn, red cabbage, blueberries, blackberries and black raspberries.  Bottom line: Consuming fruits and vegetables rich in anthocyanins may help increase HDL cholesterol levels.  8. Eat fatty fish often  The omega-3 fats in fatty fish provide major benefits to heart health, including a reduction in inflammation and better functioning of the cells that line your arteries (47, 48).  There's some research showing that eating fatty fish or taking fish oil may also help raise low levels of HDL cholesterol (49, 50, 51, 52, 53).  In a study of 33 heart disease patients, participants that consumed fatty fish four times per week experienced an increase in HDL cholesterol levels. The  particle size of their HDL also increased (52).  In another study, overweight men who consumed herring five days a week for six weeks had a 5% increase in HDL cholesterol, compared with their levels after eating lean pork and chicken five days a week (53).  However, there are a few studies that found no increase in HDL cholesterol in response to increased fish or omega-3 supplement intake (54, 55).  In addition to herring, other types of fatty fish that may help raise HDL cholesterol include salmon, sardines, mackerel and anchovies.  Bottom line: Eating fatty fish several times per week may help increase HDL cholesterol levels and provide other benefits to heart health.  9. Avoid artificial trans fats  Artificial trans fats have many negative health effects due to their inflammatory properties (56, 57).  There are two types of trans fats. One kind occurs naturally in animal products, including full-fat dairy.  In contrast, the artificial trans fats found in margarines and processed foods are created by adding hydrogen to unsaturated vegetable and seed oils. These fats are also known as industrial trans fats or partially hydrogenated fats.  Research has shown that, in addition to increasing inflammation and contributing to several health problems, these artificial trans fats may lower HDL cholesterol levels.  In one study, researchers compared how people's HDL levels responded when they consumed different margarines.  The study found that participants' HDL cholesterol levels were 10% lower after consuming margarine containing partially hydrogenated soybean oil, compared to their levels after consuming palm oil (58).  Another controlled study followed 40 adults who had diets high in different types of trans fats.  They found that HDL cholesterol levels in women were significantly lower after they consumed the diet high in industrial trans fats, compared to the diet containing naturally  occurring trans fats (59).  To protect heart health and keep HDL cholesterol in the healthy range, it's best to avoid artificial trans fats altogether.  Bottom line: Artificial trans fats have been shown to lower HDL levels and increase inflammation, compared to other fats.  Take home message  Although your  HDL cholesterol levels are partly determined by your genetics, there are many things you can do to naturally increase your own levels.  Fortunately, the practices that raise HDL cholesterol often provide other health benefits as well.      Guidelines for a Low Cholesterol, Low Saturated Fat Diet   Fats - Limit total intake of fats and oils. - Avoid butter, stick margarine, shortening, lard, palm and coconut oils. - Limit mayonnaise, salad dressings, gravies and sauces, unless they are homemade with low-fat ingredients. - Limit chocolate. - Choose low-fat and nonfat products, such as low-fat mayonnaise, low-fat or non-hydrogenated peanut butter, low-fat or fat-free salad dressings and nonfat gravy. - Use vegetable oil, such as canola or olive oil. - Look for margarine that does not contain trans fatty acids. - Use nuts in moderate amounts. - Read ingredient labels carefully to determine both amount and type of fat present in foods. Limit saturated and trans fats! - Avoid high-fat processed and convenience foods.  Meats and Meat Alternatives - Choose fish, chicken, [place] and lean meats. - Use dried beans, peas, lentils and tofu. - Limit egg yolks to three to four per week. - If you eat red meat, limit to no more than three servings per week and choose loin or round cuts. - Avoid fatty meats, such as bacon, sausage, franks, luncheon meats and ribs. - Avoid all organ meats, including liver.  Dairy - Choose nonfat or low-fat milk, yogurt and cottage cheese. - Most cheeses are high in fat. Choose cheeses made from non-fat milk, such as mozzarella and ricotta cheese. - Choose  light or fat-free cream cheese and sour cream. - Avoid cream and sauces made with cream.  Fruits and Vegetables - Eat a wide variety of fruits and vegetables. - Use lemon juice, vinegar or "mist" olive oil on vegetables. - Avoid adding sauces, fat or oil to vegetables.  Breads, Cereals and Grains - Choose whole-grain breads, cereals, pastas and rice. - Avoid high-fat snack foods, such as granola, cookies, pies, pastries, doughnuts and croissants.  Cooking Tips - Avoid deep fried foods. - Trim visible fat off meats and remove skin from poultry before cooking. - Bake, broil, boil, poach or roast poultry, fish and lean meats. - Drain and discard fat that drains out of meat as you cook it. - Add little or no fat to foods. - Use vegetable oil sprays to grease pans for cooking or baking. - Steam vegetables. - Use herbs or no-oil marinades to flavor foods.

## 2017-04-28 NOTE — Progress Notes (Signed)
Pt here for an acute care OV today   Impression and Recommendations:    1. Rib pain on left side   2. Vitamin D insufficiency   3. Closed fracture of one rib of left side, initial encounter   4. 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 Type 2 diabetes mellitus with microalbuminuria, with long-term current use of insulin (HCC)   6. Diabetes mellitus due to underlying condition without complication, with long-term current use of insulin ([place])     1. Rib pain on left side- FRACTURE present - DG Ribs Unilateral Left   Fx present lateral aspect 10th and possible 11th rib on left  D/c pt care of rib fx and splinting with pillow, NSAIDS judiciously etc  F/up if W or not slowly improving   Education and routine counseling performed. Handouts provided  Gross side effects, risk and benefits, and alternatives of medications and treatment plan in general discussed with patient.  Patient is aware that all medications have potential side effects and we are unable to predict every side effect or drug-drug interaction that may occur.   Patient will call with any questions prior to using medication if they have concerns.  Expresses verbal understanding and consents to current therapy and treatment regimen.  No barriers to understanding were identified.  Red flag symptoms and signs discussed in detail.  Patient expressed understanding regarding what to do in case of emergency\urgent symptoms  Please see AVS handed out to patient at the end of our visit for further patient instructions/ counseling done pertaining to today's office visit.   Return if symptoms worsen or fail to improve; recheck chest x-ray 6-10 weeks, for Also follow-up PCP for chronic care.     Note: This document was prepared using Dragon voice recognition software and may include unintentional dictation errors.  [name] 3:01  PM --------------------------------------------------------------------------------------------------------------------------------------------------------------------------------------------------------------------------------------------    Subjective:    CC:  Chief Complaint  Patient presents with  . Chest Pain    HPI: Dustin Ferrell is a 71 y.o. male who presents to [place] at [facility] today for issues as discussed below.  Patient fell coming out of the shower on Wednesday, November 28.  This was just 2 days ago.  He fell into the commode onto the left side of his chest.  After he fell he felt significant pain in the lateral aspect of his left side of his ribs.  He is point tender around the T11 and T10 region.   Patient states this is the third or fourth time he has fallen and broken ribs- .  2 times he had had accidents and one time broke it in the recent past from extensive coughing.  He has never had a bone density.    Wt Readings from Last 3 Encounters:  07/13/18 168 lb (76.2 kg)  07/05/18 168 lb (76.2 kg)  06/25/18 166 lb 12.8 oz (75.7 kg)   BP Readings from Last 3 Encounters:  07/13/18 (!) 125/59  07/05/18 (!) 102/58  06/25/18 133/68   BMI Readings from Last 3 Encounters:  07/13/18 27.12 kg/m  07/05/18 27.12 kg/m  06/25/18 26.92 kg/m     Patient Care Team    Relationship Specialty Notifications Start End  Esaw Grandchild, NP PCP - General Family Medicine  04/06/17   Pa, [facility]  Optometry  05/04/17   Gastroenterology, [facility]  Gastroenterology  05/04/17      Patient  Active Problem List   Diagnosis Date Noted  . 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 Diabetes mellitus due to underlying condition without complication, with long-term current use of insulin ([place]) 04/06/2017    Priority: High  . 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  Hypertension associated with diabetes ([place]) 04/06/2017    Priority: Medium  . 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 Obstructive sleep apnea- noncompliance w txmnt 10/15/2014    Priority: Medium  . Vitamin D insufficiency 04/28/2017    Priority: Low  . Personal history of traumatic brain injury 07/07/2014    Priority: Low  . Elevated LDL cholesterol level 06/25/2018  . Healthcare maintenance 12/13/2017  . Encounter for screening for malignant neoplasm of respiratory organs 07/12/2017  . Overgrown toenails 07/12/2017  . Closed fracture of one rib of left side 04/28/2017  . Elbow swelling, left 04/06/2017  . Fatigue 04/06/2017  . Screening for AAA (abdominal aortic aneurysm) 04/06/2017  . Neurological deficit, transient 10/15/2014  . Syncope 10/03/2014  . Orthostatic hypotension dysautonomic syndrome ([place]) 07/15/2014  . Colon polyps 07/07/2014    Past Medical history, Surgical history, Family history, Social history, Allergies and Medications have been entered into the medical record, reviewed and changed as needed.    Current Meds  Medication Sig  . aspirin EC 81 MG tablet Take 81 mg by mouth daily.  . sertraline (ZOLOFT) 100 MG tablet Take 200 mg daily by mouth.  . traZODone (DESYREL) 50 MG tablet Take 50 mg 2 (two) times daily by mouth.  . [DISCONTINUED] divalproex (DEPAKOTE ER) 500 MG 24 hr tablet Take 1 tablet (500 mg total) by mouth 2 (two) times daily.  . [DISCONTINUED] Insulin Glargine (LANTUS SOLOSTAR) 100 UNIT/ML Solostar Pen Inject 19 Units into the skin daily at 10 pm.  . [DISCONTINUED] Insulin Glargine (LANTUS SOLOSTAR) 100 UNIT/ML Solostar Pen Inject 22 Units into the skin daily at 10 pm.  . [DISCONTINUED] lisinopril (PRINIVIL,ZESTRIL) 10 MG tablet Take 1 tablet (10 mg total) daily by mouth.  . [DISCONTINUED] metFORMIN (GLUCOPHAGE) 1000 MG tablet Take 1 tablet (1,000 mg total) 2  (two) times daily with a meal by mouth.  . [DISCONTINUED] sitaGLIPtin (JANUVIA) 100 MG tablet Take 1 tablet (100 mg total) daily by mouth.  . [DISCONTINUED] vitamin B-12 (CYANOCOBALAMIN) 1000 MCG tablet Take 1 tablet daily by mouth.    Allergies:  No Known Allergies   Review of Systems: General:   Denies fever, chills, unexplained weight loss.  Optho/Auditory:   Denies visual changes, blurred vision/LOV Respiratory:   Denies wheeze, DOE more than baseline levels.  Cardiovascular:   Denies chest pain, palpitations, new onset peripheral edema  Gastrointestinal:   Denies nausea, vomiting, diarrhea, abd pain.  Genitourinary: Denies dysuria, freq/ urgency, flank pain or discharge from genitals.  Endocrine:     Denies hot or cold intolerance, polyuria, polydipsia. Musculoskeletal:   Denies unexplained myalgias, joint swelling, unexplained arthralgias, gait problems.  Skin:  Denies new onset rash, suspicious lesions Neurological:     Denies dizziness, unexplained weakness, numbness  Psychiatric/Behavioral:   Denies mood changes, suicidal or homicidal ideations, hallucinations    Objective:   Blood pressure 138/70, pulse 75. There is no height or weight on file to calculate BMI. General:  Well Developed, well nourished, appropriate for stated age.  Neuro:  Alert and oriented,  extra-ocular muscles intact  HEENT:  Normocephalic, atraumatic, neck supple Skin:  no gross rash, warm, pink. Cardiac:  RRR, S1 S2 Respiratory:  ECTA B/L and A/P, Not using accessory muscles, speaking in full sentences- unlabored.  Chest wall very TTP lateral aspect around t8-t10 on L Vascular:  Ext warm, no cyanosis apprec.; cap RF less 2 sec. Psych:  No HI/SI, judgement and insight good, Euthymic mood. Full Affect.

## 2017-05-15 ENCOUNTER — Other Ambulatory Visit: Payer: Self-pay

## 2017-05-15 ENCOUNTER — Other Ambulatory Visit: Payer: Self-pay | Admitting: Adult Health

## 2017-05-15 MED ORDER — DIVALPROEX SODIUM ER 500 MG PO TB24: 500.0000 mg | ORAL_TABLET | Freq: Two times a day (BID) | ORAL | 2 refills | Status: DC

## 2017-07-06 ENCOUNTER — Other Ambulatory Visit: Payer: Self-pay

## 2017-07-06 DIAGNOSIS — E089 Diabetes mellitus due to underlying condition without complications: Secondary | ICD-10-CM

## 2017-07-06 DIAGNOSIS — Z794 Long term (current) use of insulin: Principal | ICD-10-CM

## 2017-07-06 MED ORDER — INSULIN DEGLUDEC 100 UNIT/ML ~~LOC~~ SOPN: 22.0000 [IU] | PEN_INJECTOR | Freq: Every day | SUBCUTANEOUS | 6 refills | Status: DC
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
## 2017-07-11 ENCOUNTER — Ambulatory Visit: Payer: Medicare HMO | Admitting: Adult Health

## 2017-07-12 ENCOUNTER — Encounter: Payer: Self-pay | Admitting: Adult Health

## 2017-07-12 ENCOUNTER — Other Ambulatory Visit: Payer: Self-pay | Admitting: Adult Health

## 2017-07-12 ENCOUNTER — Ambulatory Visit (INDEPENDENT_AMBULATORY_CARE_PROVIDER_SITE_OTHER): Payer: Medicare HMO | Admitting: Adult Health

## 2017-07-12 ENCOUNTER — Other Ambulatory Visit: Payer: Self-pay

## 2017-07-12 VITALS — BP 129/49 | HR 71 | Ht 64.02 in | Wt 169.6 lb

## 2017-07-12 DIAGNOSIS — E089 Diabetes mellitus due to underlying condition without complications: Secondary | ICD-10-CM

## 2017-07-12 DIAGNOSIS — E1159 Type 2 diabetes mellitus with other circulatory complications: Secondary | ICD-10-CM

## 2017-07-12 DIAGNOSIS — Z136 Encounter for screening for cardiovascular disorders: Secondary | ICD-10-CM

## 2017-07-12 DIAGNOSIS — Z794 Long term (current) use of insulin: Secondary | ICD-10-CM

## 2017-07-12 DIAGNOSIS — Z122 Encounter for screening for malignant neoplasm of respiratory organs: Secondary | ICD-10-CM | POA: Diagnosis not present

## 2017-07-12 DIAGNOSIS — L602 Onychogryphosis: Secondary | ICD-10-CM

## 2017-07-12 DIAGNOSIS — Z0000 Encounter for general adult medical examination without abnormal findings: Secondary | ICD-10-CM

## 2017-07-12 DIAGNOSIS — I10 Essential (primary) hypertension: Secondary | ICD-10-CM | POA: Diagnosis not present

## 2017-07-12 LAB — POCT GLYCOSYLATED HEMOGLOBIN (HGB A1C): Hemoglobin A1C: 6.9

## 2017-07-12 NOTE — Assessment & Plan Note (Signed)
60 pack yr hx of tobacco use  LDCT ordered

## 2017-07-12 NOTE — Assessment & Plan Note (Addendum)
BP liable in clinic He denies acute cardiac sx's Provided home BP sheet, instructed to check BID and call clinic in 1 week with numbers.

## 2017-07-12 NOTE — Assessment & Plan Note (Signed)
Lab Results  Component Value Date   HGBA1C 6.9 07/12/2017   HGBA1C 7.7 04/06/2017   HGBA1C 8.4 07/09/2015   Excellent control- continue sitagliptin 100mg  QD, Metformin 1000mg  BID, and insulin degludec 22 U nightly He does not check BS at home, he denies episodes of hypoglycemia F/u 4 months

## 2017-07-12 NOTE — Assessment & Plan Note (Signed)
60 pack yr hx of tobacco use Abdominal US ordered

## 2017-07-12 NOTE — Patient Instructions (Addendum)
Heart-Healthy Eating Plan Many factors influence your heart health, including eating and exercise habits. Heart (coronary) risk increases with abnormal blood fat (lipid) levels. Heart-healthy meal planning includes limiting unhealthy fats, increasing healthy fats, and making other small dietary changes. This includes maintaining a healthy body weight to help keep lipid levels within a normal range. What is my plan? Your health care provider recommends that you:  Get no more than __25___% of the total calories in your daily diet from fat.  Limit your intake of saturated fat to less than ____5__% of your total calories each day.  Limit the amount of cholesterol in your diet to less than __300___ mg per day.  What types of fat should I choose?  Choose healthy fats more often. Choose monounsaturated and polyunsaturated fats, such as olive oil and canola oil, flaxseeds, walnuts, almonds, and seeds.  Eat more omega-3 fats. Good choices include salmon, mackerel, sardines, tuna, flaxseed oil, and ground flaxseeds. Aim to eat fish at least two times each week.  Limit saturated fats. Saturated fats are primarily found in animal products, such as meats, butter, and cream. Plant sources of saturated fats include palm oil, palm kernel oil, and coconut oil.  Avoid foods with partially hydrogenated oils in them. These contain trans fats. Examples of foods that contain trans fats are stick margarine, some tub margarines, cookies, crackers, and other baked goods. What general guidelines do I need to follow?  Check food labels carefully to identify foods with trans fats or high amounts of saturated fat.  Fill one half of your plate with vegetables and green salads. Eat 4-5 servings of vegetables per day. A serving of vegetables equals 1 cup of raw leafy vegetables,  cup of raw or cooked cut-up vegetables, or  cup of vegetable juice.  Fill one fourth of your plate with whole grains. Look for the word  "whole" as the first word in the ingredient list.  Fill one fourth of your plate with lean protein foods.  Eat 4-5 servings of fruit per day. A serving of fruit equals one medium whole fruit,  cup of dried fruit,  cup of fresh, frozen, or canned fruit, or  cup of 100% fruit juice.  Eat more foods that contain soluble fiber. Examples of foods that contain this type of fiber are apples, broccoli, carrots, beans, peas, and barley. Aim to get 20-30 g of fiber per day.  Eat more home-cooked food and less restaurant, buffet, and fast food.  Limit or avoid alcohol.  Limit foods that are high in starch and sugar.  Avoid fried foods.  Cook foods by using methods other than frying. Baking, boiling, grilling, and broiling are all great options. Other fat-reducing suggestions include: ? Removing the skin from poultry. ? Removing all visible fats from meats. ? Skimming the fat off of stews, soups, and gravies before serving them. ? Steaming vegetables in water or broth.  Lose weight if you are overweight. Losing just 5-10% of your initial body weight can help your overall health and prevent diseases such as diabetes and heart disease.  Increase your consumption of nuts, legumes, and seeds to 4-5 servings per week. One serving of dried beans or legumes equals  cup after being cooked, one serving of nuts equals 1 ounces, and one serving of seeds equals  ounce or 1 tablespoon.  You may need to monitor your salt (sodium) intake, especially if you have high blood pressure. Talk with your health care provider or dietitian to get  more information about reducing sodium. What foods can I eat? Grains  Breads, including [place], white, pita, wheat, raisin, rye, oatmeal, and [place]. Tortillas that are neither fried nor made with lard or trans fat. Low-fat rolls, including hotdog and hamburger buns and English muffins. Biscuits. Muffins. Waffles. Pancakes. Light popcorn. Whole-grain cereals. Flatbread.  Melba toast. Pretzels. Breadsticks. Rusks. Low-fat snacks and crackers, including oyster, saltine, matzo, graham, animal, and rye. Rice and pasta, including brown rice and those that are made with whole wheat. Vegetables All vegetables. Fruits All fruits, but limit coconut. Meats and Other Protein Sources Lean, well-trimmed beef, veal, pork, and lamb. Chicken and [place] without skin. All fish and shellfish. Wild duck, rabbit, pheasant, and venison. Egg whites or low-cholesterol egg substitutes. Dried beans, peas, lentils, and tofu.Seeds and most nuts. Dairy Low-fat or nonfat cheeses, including ricotta, string, and mozzarella. Skim or 1% milk that is liquid, powdered, or evaporated. Buttermilk that is made with low-fat milk. Nonfat or low-fat yogurt. Beverages Mineral water. Diet carbonated beverages. Sweets and Desserts Sherbets and fruit ices. Honey, jam, marmalade, jelly, and syrups. Meringues and gelatins. Pure sugar candy, such as hard candy, jelly beans, gumdrops, mints, marshmallows, and small amounts of dark chocolate. [employer] Eat all sweets and desserts in moderation. Fats and Oils Nonhydrogenated (trans-free) margarines. Vegetable oils, including soybean, sesame, sunflower, olive, peanut, safflower, corn, canola, and cottonseed. Salad dressings or mayonnaise that are made with a vegetable oil. Limit added fats and oils that you use for cooking, baking, salads, and as spreads. Other Cocoa powder. Coffee and tea. All seasonings and condiments. The items listed above may not be a complete list of recommended foods or beverages. Contact your dietitian for more options. What foods are not recommended? Grains Breads that are made with saturated or trans fats, oils, or whole milk. Croissants. Butter rolls. Cheese breads. Sweet rolls. Donuts. Buttered popcorn. Chow mein noodles. High-fat crackers, such as cheese or butter crackers. Meats and Other Protein Sources Fatty meats, such  as hotdogs, short ribs, sausage, spareribs, bacon, ribeye roast or steak, and mutton. High-fat deli meats, such as salami and bologna. Caviar. Domestic duck and goose. Organ meats, such as kidney, liver, sweetbreads, brains, gizzard, chitterlings, and heart. Dairy Cream, sour cream, cream cheese, and creamed cottage cheese. Whole milk cheeses, including blue (bleu), Monte[place], [place], [place], [place], [place], [place], [place], [place], and [place]. Whole or 2% milk that is liquid, evaporated, or condensed. Whole buttermilk. Cream sauce or high-fat cheese sauce. Yogurt that is made from whole milk. Beverages Regular sodas and drinks with added sugar. Sweets and Desserts Frosting. Pudding. Cookies. Cakes other than angel food cake. Candy that has milk chocolate or white chocolate, hydrogenated fat, butter, coconut, or unknown ingredients. Buttered syrups. Full-fat ice cream or ice cream drinks. Fats and Oils Gravy that has suet, meat fat, or shortening. Cocoa butter, hydrogenated oils, palm oil, coconut oil, palm kernel oil. These can often be found in baked products, candy, fried foods, nondairy creamers, and whipped toppings. Solid fats and shortenings, including bacon fat, salt pork, lard, and butter. Nondairy cream substitutes, such as coffee creamers and sour cream substitutes. Salad dressings that are made of unknown oils, cheese, or sour cream. The items listed above may not be a complete list of foods and beverages to avoid. Contact your dietitian for more information. This information is not intended to replace advice given to you by your health care provider. Make sure you discuss any questions you have with your health care  provider. Document Released: 02/23/2008 Document Revised: 12/04/2015 Document Reviewed: 11/07/2013 Elsevier Interactive Patient Education  2018 [employer].   Hypertension Hypertension, commonly called high blood pressure, is when the force of blood pumping through  the arteries is too strong. The arteries are the blood vessels that carry blood from the heart throughout the body. Hypertension forces the heart to work harder to pump blood and may cause arteries to become narrow or stiff. Having untreated or uncontrolled hypertension can cause heart attacks, strokes, kidney disease, and other problems. A blood pressure reading consists of a higher number over a lower number. Ideally, your blood pressure should be below 120/80. The first ("top") number is called the systolic pressure. It is a measure of the pressure in your arteries as your heart beats. The second ("bottom") number is called the diastolic pressure. It is a measure of the pressure in your arteries as the heart relaxes. What are the causes? The cause of this condition is not known. What increases the risk? Some risk factors for high blood pressure are under your control. Others are not. Factors you can change  Smoking.  Having type 2 diabetes mellitus, high cholesterol, or both.  Not getting enough exercise or physical activity.  Being overweight.  Having too much fat, sugar, calories, or salt (sodium) in your diet.  Drinking too much alcohol. Factors that are difficult or impossible to change  Having chronic kidney disease.  Having a family history of high blood pressure.  Age. Risk increases with age.  Race. You may be at higher risk if you are African-American.  Gender. Men are at higher risk than women before age 65. After age 4, women are at higher risk than men.  Having obstructive sleep apnea.  Stress. What are the signs or symptoms? Extremely high blood pressure (hypertensive crisis) may cause:  Headache.  Anxiety.  Shortness of breath.  Nosebleed.  Nausea and vomiting.  Severe chest pain.  Jerky movements you cannot control (seizures).  How is this diagnosed? This condition is diagnosed by measuring your blood pressure while you are seated, with your arm  resting on a surface. The cuff of the blood pressure monitor will be placed directly against the skin of your upper arm at the level of your heart. It should be measured at least twice using the same arm. Certain conditions can cause a difference in blood pressure between your right and left arms. Certain factors can cause blood pressure readings to be lower or higher than normal (elevated) for a short period of time:  When your blood pressure is higher when you are in a health care provider's office than when you are at home, this is called white coat hypertension. Most people with this condition do not need medicines.  When your blood pressure is higher at home than when you are in a health care provider's office, this is called masked hypertension. Most people with this condition may need medicines to control blood pressure.  If you have a high blood pressure reading during one visit or you have normal blood pressure with other risk factors:  You may be asked to return on a different day to have your blood pressure checked again.  You may be asked to monitor your blood pressure at home for 1 week or longer.  If you are diagnosed with hypertension, you may have other blood or imaging tests to help your health care provider understand your overall risk for other conditions. How is this treated? This  condition is treated by making healthy lifestyle changes, such as eating healthy foods, exercising more, and reducing your alcohol intake. Your health care provider may prescribe medicine if lifestyle changes are not enough to get your blood pressure under control, and if:  Your systolic blood pressure is above 130.  Your diastolic blood pressure is above 80.  Your personal target blood pressure may vary depending on your medical conditions, your age, and other factors. Follow these instructions at home: Eating and drinking  Eat a diet that is high in fiber and potassium, and low in sodium,  added sugar, and fat. An example eating plan is called the DASH (Dietary Approaches to Stop Hypertension) diet. To eat this way: ? Eat plenty of fresh fruits and vegetables. Try to fill half of your plate at each meal with fruits and vegetables. ? Eat whole grains, such as whole wheat pasta, brown rice, or whole grain bread. Fill about one quarter of your plate with whole grains. ? Eat or drink low-fat dairy products, such as skim milk or low-fat yogurt. ? Avoid fatty cuts of meat, processed or cured meats, and poultry with skin. Fill about one quarter of your plate with lean proteins, such as fish, chicken without skin, beans, eggs, and tofu. ? Avoid premade and processed foods. These tend to be higher in sodium, added sugar, and fat.  Reduce your daily sodium intake. Most people with hypertension should eat less than 1,500 mg of sodium a day.  Limit alcohol intake to no more than 1 drink a day for nonpregnant women and 2 drinks a day for men. One drink equals 12 oz of beer, 5 oz of wine, or 1 oz of hard liquor. Lifestyle  Work with your health care provider to maintain a healthy body weight or to lose weight. Ask what an ideal weight is for you.  Get at least 30 minutes of exercise that causes your heart to beat faster (aerobic exercise) most days of the week. Activities may include walking, swimming, or biking.  Include exercise to strengthen your muscles (resistance exercise), such as pilates or lifting weights, as part of your weekly exercise routine. Try to do these types of exercises for 30 minutes at least 3 days a week.  Do not use any products that contain nicotine or tobacco, such as cigarettes and e-cigarettes. If you need help quitting, ask your health care provider.  Monitor your blood pressure at home as told by your health care provider.  Keep all follow-up visits as told by your health care provider. This is important. Medicines  Take over-the-counter and prescription  medicines only as told by your health care provider. Follow directions carefully. Blood pressure medicines must be taken as prescribed.  Do not skip doses of blood pressure medicine. Doing this puts you at risk for problems and can make the medicine less effective.  Ask your health care provider about side effects or reactions to medicines that you should watch for. Contact a health care provider if:  You think you are having a reaction to a medicine you are taking.  You have headaches that keep coming back (recurring).  You feel dizzy.  You have swelling in your ankles.  You have trouble with your vision. Get help right away if:  You develop a severe headache or confusion.  You have unusual weakness or numbness.  You feel faint.  You have severe pain in your chest or abdomen.  You vomit repeatedly.  You have trouble breathing.  Summary  Hypertension is when the force of blood pumping through your arteries is too strong. If this condition is not controlled, it may put you at risk for serious complications.  Your personal target blood pressure may vary depending on your medical conditions, your age, and other factors. For most people, a normal blood pressure is less than 120/80.  Hypertension is treated with lifestyle changes, medicines, or a combination of both. Lifestyle changes include weight loss, eating a healthy, low-sodium diet, exercising more, and limiting alcohol. This information is not intended to replace advice given to you by your health care provider. Make sure you discuss any questions you have with your health care provider. Document Released: 05/16/2005 Document Revised: 04/13/2016 Document Reviewed: 04/13/2016 Elsevier Interactive Patient Education  [employer].   Please continue all medications as directed. A1c 6.9- GREAT! Increase water intake and continue to avoid alcohol. Continue to reduce tobacco use- YOU CAN DO IT! Podiatry referral  placed Abdominal US and LDCT imaging orders placed. Please check Blood Pressure twice daily and call us in 1 week with numbers. If BP consistently >150/90 then we will increase Lisinopril dosage. YOU ARE DOING GREAT! Please follow-up in 4 months.! NICE TO SEE YOU!

## 2017-07-12 NOTE — Assessment & Plan Note (Signed)
Podiatry referral placed.

## 2017-07-24 ENCOUNTER — Ambulatory Visit: Payer: Medicare HMO | Admitting: Podiatry

## 2017-07-24 ENCOUNTER — Encounter: Payer: Self-pay | Admitting: Podiatry

## 2017-07-24 VITALS — BP 130/80 | HR 73

## 2017-07-24 DIAGNOSIS — M79675 Pain in left toe(s): Secondary | ICD-10-CM

## 2017-07-24 DIAGNOSIS — B351 Tinea unguium: Secondary | ICD-10-CM

## 2017-07-24 DIAGNOSIS — M79674 Pain in right toe(s): Secondary | ICD-10-CM | POA: Diagnosis not present

## 2017-07-24 DIAGNOSIS — E119 Type 2 diabetes mellitus without complications: Secondary | ICD-10-CM

## 2017-07-24 NOTE — Progress Notes (Signed)
   Subjective:    Patient ID: Dustin Ferrell, male    DOB: Jul 20, 1945, 72 y.o.   MRN: [id number]  HPI This patient presents to the office with chief complaint of long thick nails.  He says the nails are painful walking and wearing his shoes.  He is diabetic in insulin and januvia and metformin. He presents to the office for preventative foot care services.    Review of Systems  All other systems reviewed and are negative.      Objective:   Physical Exam General Appearance  Alert, conversant and in no acute stress.  Vascular  Dorsalis pedis and posterior pulses are palpable  bilaterally.  Capillary return is within normal limits  bilaterally. Temperature is within normal limits  Bilaterally.  Neurologic  Senn-Weinstein monofilament wire test within normal limits  bilaterally. Muscle power within normal limits bilaterally.  Nails Thick disfigured discolored nails with subungual debris bilaterally from hallux to fifth toes bilaterally. No evidence of bacterial infection or drainage bilaterally.  Orthopedic  No limitations of motion of motion feet bilaterally.  No crepitus or effusions noted.  No bony pathology or digital deformities noted.  Skin  normotropic skin with no porokeratosis noted bilaterally.  No signs of infections or ulcers noted.          Assessment & Plan:  Onychomycosis     Diabetes with no foot complications.   IE  Debride nails  X 10.  RTC 3 months.   Gardiner Barefoot DPM

## 2017-07-27 ENCOUNTER — Telehealth: Payer: Self-pay | Admitting: Adult Health

## 2017-07-27 ENCOUNTER — Ambulatory Visit: Payer: Medicare HMO

## 2017-07-27 ENCOUNTER — Other Ambulatory Visit: Payer: Self-pay | Admitting: Adult Health

## 2017-07-27 ENCOUNTER — Ambulatory Visit
Admission: RE | Admit: 2017-07-27 | Discharge: 2017-07-27 | Disposition: A | Discharge status: Home / Self Care | Payer: Medicare HMO | Source: Ambulatory Visit | Attending: Adult Health | Admitting: Adult Health

## 2017-07-27 DIAGNOSIS — E278 Other specified disorders of adrenal gland: Secondary | ICD-10-CM

## 2017-07-27 DIAGNOSIS — E279 Disorder of adrenal gland, unspecified: Principal | ICD-10-CM

## 2017-07-27 NOTE — Progress Notes (Signed)
Hx of labile blood pressure and due to dementia pt is unable to articulate if he is have CP/dyspnea/palpitations.  CT Adrenal Abdomen WO contrast ordered

## 2017-07-30 NOTE — Telephone Encounter (Signed)
completed

## 2017-08-01 ENCOUNTER — Other Ambulatory Visit (INDEPENDENT_AMBULATORY_CARE_PROVIDER_SITE_OTHER): Payer: Medicare HMO

## 2017-08-01 DIAGNOSIS — Z1211 Encounter for screening for malignant neoplasm of colon: Secondary | ICD-10-CM | POA: Diagnosis not present

## 2017-08-01 LAB — IFOBT (OCCULT BLOOD)
IFOBT: NEGATIVE
IFOBT: NEGATIVE
IMMUNOLOGICAL FECAL OCCULT BLOOD TEST: NEGATIVE

## 2017-08-11 ENCOUNTER — Ambulatory Visit
Admission: RE | Admit: 2017-08-11 | Discharge: 2017-08-11 | Disposition: A | Discharge status: Home / Self Care | Payer: Medicare HMO | Source: Ambulatory Visit | Attending: Adult Health | Admitting: Adult Health

## 2017-08-11 DIAGNOSIS — E278 Other specified disorders of adrenal gland: Secondary | ICD-10-CM

## 2017-08-11 DIAGNOSIS — E279 Disorder of adrenal gland, unspecified: Principal | ICD-10-CM

## 2017-08-18 ENCOUNTER — Other Ambulatory Visit: Payer: Self-pay

## 2017-08-18 ENCOUNTER — Other Ambulatory Visit: Payer: Self-pay | Admitting: Adult Health

## 2017-08-18 DIAGNOSIS — N2889 Other specified disorders of kidney and ureter: Secondary | ICD-10-CM

## 2017-08-23 ENCOUNTER — Other Ambulatory Visit: Payer: Medicare HMO

## 2017-08-24 ENCOUNTER — Other Ambulatory Visit (INDEPENDENT_AMBULATORY_CARE_PROVIDER_SITE_OTHER): Payer: Medicare HMO

## 2017-08-24 DIAGNOSIS — N2889 Other specified disorders of kidney and ureter: Secondary | ICD-10-CM | POA: Diagnosis not present

## 2017-08-25 LAB — COMPREHENSIVE METABOLIC PANEL
ALBUMIN: 4.5 g/dL (ref 3.5–4.8)
ALT: 22 IU/L (ref 0–44)
AST: 17 IU/L (ref 0–40)
Albumin/Globulin Ratio: 2 (ref 1.2–2.2)
Alkaline Phosphatase: 47 IU/L (ref 39–117)
BUN / CREAT RATIO: 31 — AB (ref 10–24)
BUN: 25 mg/dL (ref 8–27)
Bilirubin Total: <0.2 mg/dL (ref 0.0–1.2)
CALCIUM: 9.5 mg/dL (ref 8.6–10.2)
CO2: 21 mmol/L (ref 20–29)
CREATININE: 0.81 mg/dL (ref 0.76–1.27)
Chloride: 103 mmol/L (ref 96–106)
GFR calc Af Amer: 102 mL/min/{1.73_m2} (ref >59)
GFR, EST NON AFRICAN AMERICAN: 88 mL/min/{1.73_m2} (ref >59)
GLOBULIN, TOTAL: 2.3 g/dL (ref 1.5–4.5)
Glucose: 217 mg/dL — ABNORMAL HIGH (ref 65–99)
Potassium: 5.3 mmol/L — ABNORMAL HIGH (ref 3.5–5.2)
SODIUM: 138 mmol/L (ref 134–144)
TOTAL PROTEIN: 6.8 g/dL (ref 6.0–8.5)

## 2017-08-29 ENCOUNTER — Other Ambulatory Visit: Payer: Medicare HMO

## 2017-09-05 ENCOUNTER — Ambulatory Visit
Admission: RE | Admit: 2017-09-05 | Discharge: 2017-09-05 | Disposition: A | Discharge status: Home / Self Care | Payer: Medicare HMO | Source: Ambulatory Visit | Attending: Adult Health | Admitting: Adult Health

## 2017-09-05 DIAGNOSIS — N2889 Other specified disorders of kidney and ureter: Secondary | ICD-10-CM

## 2017-09-05 MED ORDER — IOPAMIDOL (ISOVUE-300) INJECTION 61%
100.0000 mL | Freq: Once | INTRAVENOUS | Status: AC | PRN
  Administered 2017-09-05: 100 mL via INTRAVENOUS

## 2017-10-26 ENCOUNTER — Ambulatory Visit: Payer: Medicare HMO | Admitting: Podiatry

## 2017-10-30 ENCOUNTER — Ambulatory Visit: Payer: Medicare HMO | Admitting: Podiatry

## 2017-11-09 ENCOUNTER — Other Ambulatory Visit: Payer: Self-pay

## 2017-11-09 MED ORDER — DIVALPROEX SODIUM ER 500 MG PO TB24: 500.0000 mg | ORAL_TABLET | Freq: Two times a day (BID) | ORAL | 2 refills | Status: DC
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
## 2017-12-13 ENCOUNTER — Encounter: Payer: Self-pay | Admitting: Adult Health

## 2017-12-13 ENCOUNTER — Ambulatory Visit (INDEPENDENT_AMBULATORY_CARE_PROVIDER_SITE_OTHER): Payer: Medicare HMO | Admitting: Adult Health

## 2017-12-13 VITALS — BP 121/64 | HR 70 | Ht 64.0 in | Wt 164.2 lb

## 2017-12-13 DIAGNOSIS — F0280 Dementia in other diseases classified elsewhere without behavioral disturbance: Secondary | ICD-10-CM | POA: Diagnosis not present

## 2017-12-13 DIAGNOSIS — G3109 Other frontotemporal dementia: Secondary | ICD-10-CM

## 2017-12-13 DIAGNOSIS — E1159 Type 2 diabetes mellitus with other circulatory complications: Secondary | ICD-10-CM

## 2017-12-13 DIAGNOSIS — Z794 Long term (current) use of insulin: Secondary | ICD-10-CM

## 2017-12-13 DIAGNOSIS — Z0000 Encounter for general adult medical examination without abnormal findings: Secondary | ICD-10-CM | POA: Insufficient documentation

## 2017-12-13 DIAGNOSIS — I10 Essential (primary) hypertension: Secondary | ICD-10-CM | POA: Diagnosis not present

## 2017-12-13 DIAGNOSIS — E089 Diabetes mellitus due to underlying condition without complications: Secondary | ICD-10-CM

## 2017-12-13 LAB — POCT GLYCOSYLATED HEMOGLOBIN (HGB A1C): HEMOGLOBIN A1C: 6.6 % — AB (ref 4.0–5.6)

## 2017-12-13 NOTE — Assessment & Plan Note (Addendum)
Lab Results  Component Value Date   HGBA1C 6.6 (A) 12/13/2017   HGBA1C 6.9 07/12/2017   HGBA1C 7.7 04/06/2017   He does not routinely check BS at home Recommend to check AM BS at least twice/week Currently using Metformin 1000mg  BID, Sitagliptin 100mg  QD, Tresiba 22 QD A1c check in 3 months Will consider reducing anti-diabetic rx's at next A1c check

## 2017-12-13 NOTE — Assessment & Plan Note (Addendum)
BP at goal 121/64, HR 70 Continue Lisinopril 10mg  QD

## 2017-12-13 NOTE — Assessment & Plan Note (Addendum)
Followed by Neurology Q6M, last OV 08/22/17 Currently on Sertraline 200mg  QD, Depakote 500mg  BID, and Trazodone 50mg  BID

## 2017-12-13 NOTE — Assessment & Plan Note (Signed)
Blood pressure, A1c, and weight look great today! Continue to drink of plenty water and follow Mediterranean diet. Please change your needle on insulin pen after each use. Recommend regular exercise to improve balance and conditioning. Overall you are doing great! Return in 3 months for A1c Please follow-up in 6 months, office visit with fasting labs!

## 2017-12-13 NOTE — Patient Instructions (Addendum)
Mediterranean Diet A Mediterranean diet refers to food and lifestyle choices that are based on the traditions of countries located on the [place]. This way of eating has been shown to help prevent certain conditions and improve outcomes for people who have chronic diseases, like kidney disease and heart disease. What are tips for following this plan? Lifestyle  Cook and eat meals together with your family, when possible.  Drink enough fluid to keep your urine clear or pale yellow.  Be physically active every day. This includes: ? Aerobic exercise like running or swimming. ? Leisure activities like gardening, walking, or housework.  Get 7-8 hours of sleep each night.  If recommended by your health care provider, drink red wine in moderation. This means 1 glass a day for nonpregnant women and 2 glasses a day for men. A glass of wine equals 5 oz (150 mL). Reading food labels  Check the serving size of packaged foods. For foods such as rice and pasta, the serving size refers to the amount of cooked product, not dry.  Check the total fat in packaged foods. Avoid foods that have saturated fat or trans fats.  Check the ingredients list for added sugars, such as corn syrup. Shopping  At the grocery store, buy most of your food from the areas near the walls of the store. This includes: ? Fresh fruits and vegetables (produce). ? Grains, beans, nuts, and seeds. Some of these may be available in unpackaged forms or large amounts (in bulk). ? Fresh seafood. ? Poultry and eggs. ? Low-fat dairy products.  Buy whole ingredients instead of prepackaged foods.  Buy fresh fruits and vegetables in-season from local farmers markets.  Buy frozen fruits and vegetables in resealable bags.  If you do not have access to quality fresh seafood, buy precooked frozen shrimp or canned fish, such as tuna, salmon, or sardines.  Buy small amounts of raw or cooked vegetables, salads, or olives from the  deli or salad bar at your store.  Stock your pantry so you always have certain foods on hand, such as olive oil, canned tuna, canned tomatoes, rice, pasta, and beans. Cooking  Cook foods with extra-virgin olive oil instead of using butter or other vegetable oils.  Have meat as a side dish, and have vegetables or grains as your main dish. This means having meat in small portions or adding small amounts of meat to foods like pasta or stew.  Use beans or vegetables instead of meat in common dishes like chili or lasagna.  Experiment with different cooking methods. Try roasting or broiling vegetables instead of steaming or sauteing them.  Add frozen vegetables to soups, stews, pasta, or rice.  Add nuts or seeds for added healthy fat at each meal. You can add these to yogurt, salads, or vegetable dishes.  Marinate fish or vegetables using olive oil, lemon juice, garlic, and fresh herbs. Meal planning  Plan to eat 1 vegetarian meal one day each week. Try to work up to 2 vegetarian meals, if possible.  Eat seafood 2 or more times a week.  Have healthy snacks readily available, such as: ? Vegetable sticks with hummus. ? Greek yogurt. ? Fruit and nut trail mix.  Eat balanced meals throughout the week. This includes: ? Fruit: 2-3 servings a day ? Vegetables: 4-5 servings a day ? Low-fat dairy: 2 servings a day ? Fish, poultry, or lean meat: 1 serving a day ? Beans and legumes: 2 or more servings a week ? Nuts   and seeds: 1-2 servings a day ? Whole grains: 6-8 servings a day ? Extra-virgin olive oil: 3-4 servings a day  Limit red meat and sweets to only a few servings a month What are my food choices?  Mediterranean diet ? Recommended ? Grains: Whole-grain pasta. Brown rice. Bulgar wheat. Polenta. Couscous. Whole-wheat bread. [place]. ? Vegetables: Artichokes. Beets. Broccoli. Cabbage. Carrots. Eggplant. Green beans. Chard. Kale. Spinach. Onions. Leeks. Peas. Squash.  Tomatoes. Peppers. Radishes. ? Fruits: Apples. Apricots. Avocado. Berries. Bananas. Cherries. Dates. Figs. Grapes. Lemons. Melon. Oranges. Peaches. Plums. Pomegranate. ? Meats and other protein foods: Beans. Almonds. Sunflower seeds. Pine nuts. Peanuts. [place]. Salmon. Scallops. Shrimp. [place]. Tilapia. Clams. Oysters. Eggs. ? Dairy: Low-fat milk. Cheese. Greek yogurt. ? Beverages: Water. Red wine. Herbal tea. ? Fats and oils: Extra virgin olive oil. Avocado oil. Grape seed oil. ? Sweets and desserts: [place] yogurt with honey. Baked apples. Poached pears. Trail mix. ? Seasoning and other foods: Basil. Cilantro. Coriander. Cumin. Mint. Parsley. Sage. Rosemary. Tarragon. Garlic. Oregano. Thyme. Pepper. Balsalmic vinegar. Tahini. Hummus. Tomato sauce. Olives. Mushrooms. ? Limit these ? Grains: Prepackaged pasta or rice dishes. Prepackaged cereal with added sugar. ? Vegetables: Deep fried potatoes (french fries). ? Fruits: Fruit canned in syrup. ? Meats and other protein foods: Beef. Pork. Lamb. Poultry with skin. Hot dogs. [place] Salines. ? Dairy: Ice cream. Sour cream. Whole milk. ? Beverages: Juice. Sugar-sweetened soft drinks. Beer. Liquor and spirits. ? Fats and oils: Butter. Canola oil. Vegetable oil. Beef fat (tallow). Lard. ? Sweets and desserts: Cookies. Cakes. Pies. Candy. ? Seasoning and other foods: Mayonnaise. Premade sauces and marinades. ? The items listed may not be a complete list. Talk with your dietitian about what dietary choices are right for you. Summary  The Mediterranean diet includes both food and lifestyle choices.  Eat a variety of fresh fruits and vegetables, beans, nuts, seeds, and whole grains.  Limit the amount of red meat and sweets that you eat.  Talk with your health care provider about whether it is safe for you to drink red wine in moderation. This means 1 glass a day for nonpregnant women and 2 glasses a day for men. A glass of wine equals 5 oz (150 mL). This information  is not intended to replace advice given to you by your health care provider. Make sure you discuss any questions you have with your health care provider. Document Released: 01/07/2016 Document Revised: 02/09/2016 Document Reviewed: 01/07/2016 Elsevier Interactive Patient Education  2018 [employer].  Blood pressure, A1c, and weight look great today! Continue to drink of plenty water and follow Mediterranean diet. Please change your needle on insulin pen after each use. Recommend regular exercise to improve balance and conditioning. Overall you are doing great! Return in 3 months for A1c Please follow-up in 6 months, office visit with fasting labs! GREAT TO SEE YOU!

## 2017-12-14 ENCOUNTER — Telehealth: Payer: Self-pay

## 2017-12-14 NOTE — Telephone Encounter (Signed)
Pt's spouse informed to check pt's blood sugar fasting twice weekly and, if readings are consistently less than 90, to call our office.  Spouse expressed understanding and is agreeable.  Charyl Bigger, CMA

## 2018-02-12 LAB — HM DIABETES EYE EXAM

## 2018-03-15 ENCOUNTER — Other Ambulatory Visit: Payer: Self-pay

## 2018-03-15 ENCOUNTER — Other Ambulatory Visit (INDEPENDENT_AMBULATORY_CARE_PROVIDER_SITE_OTHER): Payer: Medicare HMO

## 2018-03-15 DIAGNOSIS — E089 Diabetes mellitus due to underlying condition without complications: Secondary | ICD-10-CM

## 2018-03-15 DIAGNOSIS — E1159 Type 2 diabetes mellitus with other circulatory complications: Secondary | ICD-10-CM

## 2018-03-15 DIAGNOSIS — Z794 Long term (current) use of insulin: Secondary | ICD-10-CM | POA: Diagnosis not present

## 2018-03-15 DIAGNOSIS — I10 Essential (primary) hypertension: Principal | ICD-10-CM

## 2018-03-15 DIAGNOSIS — E559 Vitamin D deficiency, unspecified: Secondary | ICD-10-CM

## 2018-03-15 DIAGNOSIS — Z0000 Encounter for general adult medical examination without abnormal findings: Secondary | ICD-10-CM

## 2018-03-15 LAB — POCT GLYCOSYLATED HEMOGLOBIN (HGB A1C): Hemoglobin A1C: 6.7 % — AB (ref 4.0–5.6)

## 2018-04-11 ENCOUNTER — Other Ambulatory Visit: Payer: Self-pay | Admitting: Adult Health

## 2018-04-19 ENCOUNTER — Ambulatory Visit (INDEPENDENT_AMBULATORY_CARE_PROVIDER_SITE_OTHER): Payer: Medicare HMO

## 2018-04-19 VITALS — BP 153/56 | HR 67 | Temp 97.5°F

## 2018-04-19 DIAGNOSIS — Z23 Encounter for immunization: Secondary | ICD-10-CM | POA: Diagnosis not present

## 2018-04-19 NOTE — Progress Notes (Signed)
Pt here for influenza vaccine.  Screening questionnaire reviewed, VIS provided to patient, and any/all patient questions answered.  T. Chrishonda Hesch, CMA  

## 2018-05-03 ENCOUNTER — Other Ambulatory Visit: Payer: Self-pay

## 2018-05-03 MED ORDER — LISINOPRIL 10 MG PO TABS: 10.0000 mg | ORAL_TABLET | Freq: Every day | ORAL | 3 refills | Status: DC

## 2018-05-15 ENCOUNTER — Other Ambulatory Visit: Payer: Self-pay

## 2018-05-15 ENCOUNTER — Other Ambulatory Visit (INDEPENDENT_AMBULATORY_CARE_PROVIDER_SITE_OTHER): Payer: Medicare HMO

## 2018-05-15 DIAGNOSIS — I152 Hypertension secondary to endocrine disorders: Secondary | ICD-10-CM

## 2018-05-15 DIAGNOSIS — E089 Diabetes mellitus due to underlying condition without complications: Secondary | ICD-10-CM

## 2018-05-15 DIAGNOSIS — Z794 Long term (current) use of insulin: Secondary | ICD-10-CM

## 2018-05-15 DIAGNOSIS — I10 Essential (primary) hypertension: Secondary | ICD-10-CM

## 2018-05-15 DIAGNOSIS — Z0000 Encounter for general adult medical examination without abnormal findings: Secondary | ICD-10-CM

## 2018-05-15 DIAGNOSIS — E1159 Type 2 diabetes mellitus with other circulatory complications: Secondary | ICD-10-CM

## 2018-05-15 DIAGNOSIS — E559 Vitamin D deficiency, unspecified: Secondary | ICD-10-CM

## 2018-05-15 DIAGNOSIS — Z5181 Encounter for therapeutic drug level monitoring: Secondary | ICD-10-CM

## 2018-05-16 ENCOUNTER — Other Ambulatory Visit: Payer: Self-pay | Admitting: Adult Health

## 2018-05-16 DIAGNOSIS — E559 Vitamin D deficiency, unspecified: Secondary | ICD-10-CM

## 2018-05-16 LAB — VALPROIC ACID LEVEL: VALPROIC ACID LVL: 12 ug/mL — AB (ref 50–100)

## 2018-05-16 LAB — COMPREHENSIVE METABOLIC PANEL
ALK PHOS: 44 IU/L (ref 39–117)
ALT: 18 IU/L (ref 0–44)
AST: 19 IU/L (ref 0–40)
Albumin/Globulin Ratio: 2 (ref 1.2–2.2)
Albumin: 4.3 g/dL (ref 3.5–4.8)
BUN/Creatinine Ratio: 24 (ref 10–24)
BUN: 19 mg/dL (ref 8–27)
Bilirubin Total: <0.2 mg/dL (ref 0.0–1.2)
CO2: 20 mmol/L (ref 20–29)
Calcium: 9 mg/dL (ref 8.6–10.2)
Chloride: 105 mmol/L (ref 96–106)
Creatinine, Ser: 0.78 mg/dL (ref 0.76–1.27)
GFR calc Af Amer: 103 mL/min/{1.73_m2} (ref >59)
GFR calc non Af Amer: 89 mL/min/{1.73_m2} (ref >59)
Globulin, Total: 2.1 g/dL (ref 1.5–4.5)
Glucose: 111 mg/dL — ABNORMAL HIGH (ref 65–99)
POTASSIUM: 4.4 mmol/L (ref 3.5–5.2)
Sodium: 140 mmol/L (ref 134–144)
Total Protein: 6.4 g/dL (ref 6.0–8.5)

## 2018-05-16 LAB — CBC WITH DIFFERENTIAL/PLATELET
Basophils Absolute: 0.1 10*3/uL (ref 0.0–0.2)
Basos: 1 %
EOS (ABSOLUTE): 0.3 10*3/uL (ref 0.0–0.4)
Eos: 5 %
Hematocrit: 33.7 % — ABNORMAL LOW (ref 37.5–51.0)
Hemoglobin: 10.2 g/dL — ABNORMAL LOW (ref 13.0–17.7)
Immature Grans (Abs): 0 10*3/uL (ref 0.0–0.1)
Immature Granulocytes: 0 %
Lymphocytes Absolute: 1.4 10*3/uL (ref 0.7–3.1)
Lymphs: 28 %
MCH: 25.6 pg — ABNORMAL LOW (ref 26.6–33.0)
MCHC: 30.3 g/dL — ABNORMAL LOW (ref 31.5–35.7)
MCV: 85 fL (ref 79–97)
Monocytes Absolute: 0.6 10*3/uL (ref 0.1–0.9)
Monocytes: 11 %
Neutrophils Absolute: 2.8 10*3/uL (ref 1.4–7.0)
Neutrophils: 55 %
PLATELETS: 303 10*3/uL (ref 150–450)
RBC: 3.98 x10E6/uL — ABNORMAL LOW (ref 4.14–5.80)
RDW: 15.1 % (ref 12.3–15.4)
WBC: 5.1 10*3/uL (ref 3.4–10.8)

## 2018-05-16 LAB — HEMOGLOBIN A1C
Est. average glucose Bld gHb Est-mCnc: 146 mg/dL
Hgb A1c MFr Bld: 6.7 % — ABNORMAL HIGH (ref 4.8–5.6)

## 2018-05-16 LAB — LIPID PANEL
CHOL/HDL RATIO: 2.7 ratio (ref 0.0–5.0)
Cholesterol, Total: 174 mg/dL (ref 100–199)
HDL: 64 mg/dL (ref >39)
LDL Calculated: 86 mg/dL (ref 0–99)
Triglycerides: 121 mg/dL (ref 0–149)
VLDL Cholesterol Cal: 24 mg/dL (ref 5–40)

## 2018-05-16 LAB — VITAMIN D 25 HYDROXY (VIT D DEFICIENCY, FRACTURES): Vit D, 25-Hydroxy: 23.7 ng/mL — ABNORMAL LOW (ref 30.0–100.0)

## 2018-05-16 LAB — TSH: TSH: 2.01 u[IU]/mL (ref 0.450–4.500)

## 2018-05-16 LAB — HEPATIC FUNCTION PANEL: Bilirubin, Direct: 0.09 mg/dL (ref 0.00–0.40)

## 2018-05-16 MED ORDER — VITAMIN D (ERGOCALCIFEROL) 1.25 MG (50000 UNIT) PO CAPS: 50000.0000 [IU] | ORAL_CAPSULE | ORAL | 12 refills | Status: DC

## 2018-05-17 ENCOUNTER — Other Ambulatory Visit: Payer: Self-pay | Admitting: Adult Health

## 2018-05-17 ENCOUNTER — Telehealth: Payer: Self-pay | Admitting: Adult Health

## 2018-05-17 DIAGNOSIS — D649 Anemia, unspecified: Secondary | ICD-10-CM

## 2018-05-17 DIAGNOSIS — R718 Other abnormality of red blood cells: Secondary | ICD-10-CM

## 2018-05-17 DIAGNOSIS — E559 Vitamin D deficiency, unspecified: Secondary | ICD-10-CM

## 2018-05-17 MED ORDER — VITAMIN D (ERGOCALCIFEROL) 1.25 MG (50000 UNIT) PO CAPS: ORAL_CAPSULE | ORAL | 12 refills | Status: DC

## 2018-05-17 NOTE — Telephone Encounter (Signed)
Dustin Ferrell- please tell pt to take Monday and Wed and we will recheck level in 3 months Do not take any other Vit d Supplmentation

## 2018-05-17 NOTE — Progress Notes (Signed)
Good Morning Tonya, I sent in RF on ergocalciferol, I want him to complete what he has and to start the new Rx AND to take once daily Vit d 2,000 IU. His level remains low despite rx strength supplementation, so he might benefit from additional daily OTC dose. Thanks! [name]

## 2018-05-17 NOTE — Telephone Encounter (Signed)
Pt's spouse informed of dosage change.  Ms. Palinkas expressed understanding and is agreeable.  Charyl Bigger, CMA

## 2018-05-17 NOTE — Progress Notes (Signed)
Kenney Houseman- please tell pt to take Monday and Wed and we will recheck level in 3 months Do not take any other Vit d Supplmentation

## 2018-05-18 ENCOUNTER — Other Ambulatory Visit: Payer: Medicare HMO

## 2018-06-18 ENCOUNTER — Other Ambulatory Visit (INDEPENDENT_AMBULATORY_CARE_PROVIDER_SITE_OTHER): Payer: Medicare HMO

## 2018-06-18 DIAGNOSIS — Z1211 Encounter for screening for malignant neoplasm of colon: Secondary | ICD-10-CM | POA: Diagnosis not present

## 2018-06-18 LAB — POC HEMOCCULT BLD/STL (HOME/3-CARD/SCREEN)
Card #2 Fecal Occult Blod, POC: POSITIVE
Card #3 Fecal Occult Blood, POC: NEGATIVE
Fecal Occult Blood, POC: NEGATIVE

## 2018-06-18 NOTE — Progress Notes (Signed)
Patient brought in IFOB testing. MPulliam, CMA/RT(R)

## 2018-06-20 ENCOUNTER — Other Ambulatory Visit: Payer: Self-pay

## 2018-06-20 DIAGNOSIS — Z1211 Encounter for screening for malignant neoplasm of colon: Secondary | ICD-10-CM

## 2018-06-20 NOTE — Progress Notes (Signed)
Subjective:    Patient ID: Dustin Ferrell, male    DOB: 09-12-1945, 73 y.o.   MRN: [id number]  HPI:  Dustin Ferrell is here for CPE He reports medication compliance, denies SE He denies formal exercise, reports "running around the house" He continues to use tobacco- 1/2 pack a day, declined smoking cessation He reports less anxiety since [facility] increased Depakote  Obtained repeat CBC- sig drop in H/H noted in 04/2018 He denies hematochezia/hematuria He never checks his BS at home Lab Results  Component Value Date   HGBA1C 6.7 (H) 05/15/2018   HGBA1C 6.7 (A) 03/15/2018   HGBA1C 6.6 (A) 12/13/2017   Wife at [facility] during [place] recent labs The 10-year ASCVD risk score [name], et al., 2013) is: 55.1%   Values used to calculate the score:     Age: 10 years     Sex: Male     Is Non-Hispanic African American: No     Diabetic: Yes     Tobacco smoker: Yes     Systolic Blood Pressure: 716 mmHg     Is BP treated: Yes     HDL Cholesterol: 64 mg/dL     Total Cholesterol: 174 mg/dL  LDL-86 Discussed starting statin therapy, agreeable to starting atorvastatin 10mg  QD  Healthcare Maintenance: Colonoscopy-UTD, recent IFOBT +, drop in H/H- referred to GI LDCT-UTD AAA Screening-UTD Immnunizations-  Review of Systems  Constitutional: Positive for fatigue. Negative for activity change, appetite change, chills, diaphoresis, fever and unexpected weight change.  HENT: Negative for congestion.   Eyes: Negative for visual disturbance.  Respiratory: Negative for cough, chest tightness, shortness of breath, wheezing and stridor.   Cardiovascular: Negative for chest pain, palpitations and leg swelling.  Gastrointestinal: Negative for abdominal distention, abdominal pain, blood in stool, constipation, diarrhea, nausea and vomiting.  Endocrine: Negative for cold intolerance, heat intolerance, polydipsia, polyphagia and polyuria.  Genitourinary: Negative for difficulty  urinating and frequency.  Musculoskeletal: Negative for arthralgias, back pain, gait problem, joint swelling, myalgias, neck pain and neck stiffness.  Skin: Positive for wound.       He reports "picking" at skin  Neurological: Positive for tremors. Negative for dizziness and headaches.  Hematological: Does not bruise/bleed easily.  Psychiatric/Behavioral: Positive for agitation, behavioral problems and confusion. Negative for decreased concentration, dysphoric mood, hallucinations, self-injury, sleep disturbance and suicidal ideas. The patient is nervous/anxious. The patient is not hyperactive.        Objective:   Physical Exam Constitutional:      General: He is not in acute distress.    Appearance: He is normal weight. He is not ill-appearing, toxic-appearing or diaphoretic.  HENT:     Head: Normocephalic and atraumatic.     Right Ear: Tympanic membrane, ear canal and external ear normal.     Left Ear: Tympanic membrane, ear canal and external ear normal.     Nose: Nose normal. No congestion.  Eyes:     Extraocular Movements: Extraocular movements intact.     Conjunctiva/sclera: Conjunctivae normal.     Pupils: Pupils are equal, round, and reactive to light.  Cardiovascular:     Rate and Rhythm: Normal rate.     Pulses: Normal pulses.     Heart sounds: Normal heart sounds. No murmur. No friction rub. No gallop.   Pulmonary:     Effort: Pulmonary effort is normal. No respiratory distress.     Breath sounds: Normal breath sounds. No stridor. No wheezing or rhonchi.  Chest:     Chest wall: No tenderness.  Abdominal:     General: Abdomen is flat. Bowel sounds are normal. There is no distension.     Palpations: Abdomen is soft. There is no mass.     Tenderness: There is no abdominal tenderness. There is no right CVA tenderness, left CVA tenderness, guarding or rebound.     Hernia: No hernia is present.  Genitourinary:    Comments: Declined DRE/gential examination Musculoskeletal:  Normal range of motion.  Lymphadenopathy:     Cervical: No cervical adenopathy.  Skin:    General: Skin is warm.     Capillary Refill: Capillary refill takes less than 2 seconds.     Findings: Abrasion and wound present.     Nails: There is clubbing.     Comments: "picking" wounds in various stages of healing noted on bil upper extremities   Neurological:     Mental Status: He is alert. Mental status is at baseline.     Motor: Tremor present.     Gait: Gait normal.     Comments: Bil upper ext tremor noted- his baseline  Psychiatric:        Mood and Affect: Mood normal.       Assessment & Plan:   1. 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 Low hemoglobin   3. Vitamin D insufficiency   4. On statin therapy   5. Elevated LDL cholesterol level   6. Healthcare maintenance     Healthcare maintenance Please continue all medications as directed, with one new addition- Atorvastatin 10mg  once nightly. We will call you when lab results are available. Increase water intake and reduce saturated fat. Someone from Gastroenterology will call to schedule an appt soon. Please schedule lab appt in 6 weeks, Liver Function Test. Please schedule regular follow-up in 6 months.  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

## 2018-06-25 ENCOUNTER — Encounter: Payer: Self-pay | Admitting: Adult Health

## 2018-06-25 ENCOUNTER — Ambulatory Visit (INDEPENDENT_AMBULATORY_CARE_PROVIDER_SITE_OTHER): Payer: Medicare HMO | Admitting: Adult Health

## 2018-06-25 VITALS — BP 133/68 | HR 66 | Temp 97.7°F | Ht 66.0 in | Wt 166.8 lb

## 2018-06-25 DIAGNOSIS — E089 Diabetes mellitus due to underlying condition without complications: Secondary | ICD-10-CM

## 2018-06-25 DIAGNOSIS — E7800 Pure hypercholesterolemia, unspecified: Secondary | ICD-10-CM | POA: Insufficient documentation

## 2018-06-25 DIAGNOSIS — Z794 Long term (current) use of insulin: Secondary | ICD-10-CM

## 2018-06-25 DIAGNOSIS — Z79899 Other long term (current) drug therapy: Secondary | ICD-10-CM

## 2018-06-25 DIAGNOSIS — Z0000 Encounter for general adult medical examination without abnormal findings: Secondary | ICD-10-CM

## 2018-06-25 DIAGNOSIS — D649 Anemia, unspecified: Secondary | ICD-10-CM

## 2018-06-25 DIAGNOSIS — E559 Vitamin D deficiency, unspecified: Secondary | ICD-10-CM | POA: Diagnosis not present

## 2018-06-25 LAB — POCT UA - MICROALBUMIN
Creatinine, POC: 300 mg/dL
Microalbumin Ur, POC: 10 mg/L

## 2018-06-25 MED ORDER — ATORVASTATIN CALCIUM 10 MG PO TABS: 10.0000 mg | ORAL_TABLET | Freq: Every day | ORAL | 3 refills | Status: DC

## 2018-06-25 NOTE — Assessment & Plan Note (Signed)
Lab Results  Component Value Date   HGBA1C 6.7 (H) 05/15/2018   HGBA1C 6.7 (A) 03/15/2018   HGBA1C 6.6 (A) 12/13/2017  Microalbumin-normal Continue current anti-diabetic regime He does not check BS at home

## 2018-06-25 NOTE — Assessment & Plan Note (Signed)
Please continue all medications as directed, with one new addition- Atorvastatin 10mg  once nightly. We will call you when lab results are available. Increase water intake and reduce saturated fat. Someone from Gastroenterology will call to schedule an appt soon. Please schedule lab appt in 6 weeks, Liver Function Test. Please schedule regular follow-up in 6 months.

## 2018-06-25 NOTE — Assessment & Plan Note (Signed)
The 10-year ASCVD risk score [name], et al., 2013) is: 46.3%   Values used to calculate the score:     Age: 73 years     Sex: Male     Is Non-Hispanic African American: No     Diabetic: Yes     Tobacco smoker: Yes     Systolic Blood Pressure: 007 mmHg     Is BP treated: Yes     HDL Cholesterol: 64 mg/dL     Total Cholesterol: 174 mg/dL  LDL-86 Started on Atorvastatin 10mg  QD Increase water, decrease sat fat LFTs check in 6 weeks, Lipids in 6 months

## 2018-06-25 NOTE — Patient Instructions (Addendum)

## 2018-06-26 ENCOUNTER — Other Ambulatory Visit: Payer: Self-pay | Admitting: Adult Health

## 2018-06-26 DIAGNOSIS — E559 Vitamin D deficiency, unspecified: Secondary | ICD-10-CM

## 2018-06-26 LAB — CBC WITH DIFFERENTIAL/PLATELET
Basophils Absolute: 0.1 10*3/uL (ref 0.0–0.2)
Basos: 1 %
EOS (ABSOLUTE): 0.3 10*3/uL (ref 0.0–0.4)
Eos: 5 %
Hematocrit: 34.9 % — ABNORMAL LOW (ref 37.5–51.0)
Hemoglobin: 11.1 g/dL — ABNORMAL LOW (ref 13.0–17.7)
IMMATURE GRANULOCYTES: 0 %
Immature Grans (Abs): 0 10*3/uL (ref 0.0–0.1)
Lymphocytes Absolute: 1.4 10*3/uL (ref 0.7–3.1)
Lymphs: 21 %
MCH: 25.6 pg — ABNORMAL LOW (ref 26.6–33.0)
MCHC: 31.8 g/dL (ref 31.5–35.7)
MCV: 80 fL (ref 79–97)
Monocytes Absolute: 0.5 10*3/uL (ref 0.1–0.9)
Monocytes: 8 %
Neutrophils Absolute: 4.3 10*3/uL (ref 1.4–7.0)
Neutrophils: 65 %
Platelets: 335 10*3/uL (ref 150–450)
RBC: 4.34 x10E6/uL (ref 4.14–5.80)
RDW: 14.6 % (ref 11.6–15.4)
WBC: 6.6 10*3/uL (ref 3.4–10.8)

## 2018-06-26 LAB — VITAMIN D 25 HYDROXY (VIT D DEFICIENCY, FRACTURES): Vit D, 25-Hydroxy: 41.8 ng/mL (ref 30.0–100.0)

## 2018-06-26 MED ORDER — VITAMIN D (ERGOCALCIFEROL) 1.25 MG (50000 UNIT) PO CAPS: ORAL_CAPSULE | ORAL | 12 refills | Status: DC

## 2018-06-28 ENCOUNTER — Encounter: Payer: Self-pay | Admitting: Nurse Practitioner

## 2018-07-05 ENCOUNTER — Ambulatory Visit: Payer: Medicare HMO | Admitting: Nurse Practitioner

## 2018-07-05 ENCOUNTER — Encounter: Payer: Self-pay | Admitting: Nurse Practitioner

## 2018-07-05 ENCOUNTER — Other Ambulatory Visit (INDEPENDENT_AMBULATORY_CARE_PROVIDER_SITE_OTHER): Payer: Medicare HMO

## 2018-07-05 ENCOUNTER — Encounter: Payer: Self-pay | Admitting: Gastroenterology

## 2018-07-05 VITALS — BP 102/58 | HR 66 | Ht 66.0 in | Wt 168.0 lb

## 2018-07-05 DIAGNOSIS — K219 Gastro-esophageal reflux disease without esophagitis: Secondary | ICD-10-CM

## 2018-07-05 DIAGNOSIS — D649 Anemia, unspecified: Secondary | ICD-10-CM

## 2018-07-05 DIAGNOSIS — R195 Other fecal abnormalities: Secondary | ICD-10-CM

## 2018-07-05 LAB — IBC + FERRITIN
FERRITIN: 8.7 ng/mL — AB (ref 22.0–322.0)
Iron: 35 ug/dL — ABNORMAL LOW (ref 42–165)
Saturation Ratios: 7.2 % — ABNORMAL LOW (ref 20.0–50.0)
Transferrin: 346 mg/dL (ref 212.0–360.0)

## 2018-07-05 LAB — FOLATE: Folate: >24 ng/mL (ref >5.9)

## 2018-07-05 LAB — VITAMIN B12: Vitamin B-12: 343 pg/mL (ref 211–911)

## 2018-07-05 MED ORDER — OMEPRAZOLE 20 MG PO CPDR: 20.0000 mg | DELAYED_RELEASE_CAPSULE | Freq: Every evening | ORAL | 3 refills | Status: DC

## 2018-07-05 MED ORDER — NA SULFATE-K SULFATE-MG SULF 17.5-3.13-1.6 GM/177ML PO SOLN: ORAL | 0 refills | Status: DC

## 2018-07-05 NOTE — Patient Instructions (Signed)
If you are age 75 or older, your body mass index should be between 23-30. Your Body mass index is 27.12 kg/m. If this is out of the aforementioned range listed, please consider follow up with your Primary Care Provider.  If you are age 58 or younger, your body mass index should be between 19-25. Your Body mass index is 27.12 kg/m. If this is out of the aformentioned range listed, please consider follow up with your Primary Care Provider.   You have been scheduled for an endoscopy and colonoscopy. Please follow the written instructions given to you at your visit today. Please pick up your prep supplies at the pharmacy within the next 1-3 days. If you use inhalers (even only as needed), please bring them with you on the day of your procedure. Your physician has requested that you go to www.startemmi.com and enter the access code given to you at your visit today. This web site gives a general overview about your procedure. However, you should still follow specific instructions given to you by our office regarding your preparation for the procedure.  We have sent the following medications to your pharmacy for you to pick up at your convenience: Suprep Omeprazole  Your provider has requested that you go to the basement level for lab work before leaving today. Press "B" on the elevator. The lab is located at the first door on the left as you exit the elevator.   Thank you for choosing me and [facility].   Tye Savoy, NP

## 2018-07-05 NOTE — Progress Notes (Addendum)
Received endoscopy reports from [facility].  Patient had a colonoscopy 09/22/2015.  The print is so small that I can hardly read the report but it does appear that the procedure was done for evaluation of colon polyps.  Preparation of the colon was fair.  A couple of polyps were removed.  Both polyps were tubular adenomas without HGD.               ASSESSMENT / PLAN:   56. 73 year old male referred for anemia and positive Hemoccult.  Hemoglobin down 2 g from 13.2 in November 2013 to 11.1 grams. No overt bleeding and no focal GI symptoms.  Last colonoscopy per patient was done with [facility] approximately 3 years ago.  He believes it was done for polyp surveillance, I do not have results.  Patient takes a daily baby aspirin, no other NSAIDs. -Given NSAID use and heme positive stool will start daily PPI -Patient needs endoscopic work-up.  His MCV is borderline , will check iron studies  -Patient was scheduled for EGD, possibly with small bowel biopsies and colonoscopy with possible polypectomy . The risks and benefits of these procedures were discussed and the patient agrees to proceed.  -If iron deficient will need repletion -If not iron deficient and complete GI evaluation negative, may need Hematology referral -Procedures scheduled with Dr. Silverio Decamp per request. Patient's wife followed by her.   2.  GERD, daily symptoms,  mainly in the evening.  For the most part times usually relieved discomfort. -With such frequent symptoms I think he should be on daily antireflux med.  Starting omeprazole anyway for #1.  -Antireflux measures discussed  HPI:    Chief Complaint:   Anemia, Hemoccult positive stool  73 year old male, father of Dr. Mellody Dance (Primary Care) with PMH of traumatic brain injury, DM 2, vitamin D deficiency, hyperlipidemia , history of seizures (last one 3 years ago).  He has a family history of stomach cancer in mother in her 73s.  Mother sister also apparently had gastric  cancer.  Patient referred by Mina Marble, NP after routine labs showed a decline in hemoglobin from baseline of 13.2 in November 2018 to 11.1.  MCV borderline at 80.  White count and platelets normal .  Patient has not seen any blood in his bowel movements nor black stools .  1 of 3 Hemoccult cards positive.  No abdominal pain, nausea, or bowel changes.  His weight is stable.  Basically, he feels fine.  Patient has taken a baby aspirin every day for years, no other NSAID use.  Patient gives a remote history of peptic ulcer disease, sounds like H. pylori related.  He reports a colonoscopy with [facility] in 2017.  Sounds like this was done for polyp surveillance.  I do not have records.  Patient has a history of GERD with almost daily heartburn, mainly in the evenings.  He usually takes a TUM in the evening and this stops the heartburn.  He generally goes to bed on an empty stomach.   Data Reviewed:  Labs 06/25/2018 WBC 6.6, hemoglobin 11.1, MCV 80, platelets 335  Labs 05/15/2018 Normal renal function, Normal liver test    Past Medical History:  Diagnosis Date  . 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 Dementia with behavioral disturbance ([place])   . Diabetes mellitus without complication ([place])   . History of traumatic head injury   . Hypertension      Past Surgical History:  Procedure Laterality Date  . Eye Socket fracture repair Right   . HERNIA REPAIR    . ROTATOR CUFF REPAIR Left   . TONSILLECTOMY    . WRIST FRACTURE SURGERY Right    Family History  Problem Relation Age of Onset  . Stomach cancer Mother   . Heart attack Father   . Diabetes Father   . Colon cancer Neg Hx    Social History   Tobacco Use  . Smoking status: Current Every Day Smoker    Packs/day: 1.00    Years: 60.00    Pack years: 60.00    Types: Cigarettes  . Smokeless tobacco: Never Used  Substance Use Topics  . Alcohol use: Yes    Alcohol/week: 18.0 standard drinks    Types:  18 Glasses of wine per week    Comment: Has a few drinks in the evening 2-3 beers  . Drug use: No   Current Outpatient Medications  Medication Sig Dispense Refill  . aspirin EC 81 MG tablet Take 81 mg by mouth daily.    Marland Kitchen atorvastatin (LIPITOR) 10 MG tablet Take 1 tablet (10 mg total) by mouth daily. 90 tablet 3  . divalproex (DEPAKOTE ER) 500 MG 24 hr tablet Take 1 tablet (500 mg total) by mouth 2 (two) times daily. (Patient taking differently: Take 500 mg by mouth daily. ) 180 tablet 2  . insulin degludec (TRESIBA FLEXTOUCH) 100 UNIT/ML SOPN FlexTouch Pen Inject 0.22 mLs (22 Units total) into the skin daily at 10 pm. 1 pen 6  . lisinopril (PRINIVIL,ZESTRIL) 10 MG tablet Take 1 tablet (10 mg total) by mouth daily. 90 tablet 3  . metFORMIN (GLUCOPHAGE) 1000 MG tablet TAKE 1 TABLET (1,000 MG TOTAL) 2 (TWO) TIMES DAILY WITH A MEAL BY MOUTH. 180 tablet 3  . sertraline (ZOLOFT) 100 MG tablet Take 200 mg daily by mouth.    . sitaGLIPtin (JANUVIA) 100 MG tablet Take 1 tablet (100 mg total) daily by mouth. 90 tablet 3  . traZODone (DESYREL) 50 MG tablet Take 50 mg 2 (two) times daily by mouth.    . Vitamin D, Ergocalciferol, (DRISDOL) 1.25 MG (50000 UT) CAPS capsule Take Monday and Wednesday 12 capsule 12   No current facility-administered medications for this visit.    No Known Allergies   Review of Systems: Positive for anxiety, arthritis, cramps  All other systems reviewed and negative except where noted in HPI.   Creatinine clearance cannot be calculated (Patient's most recent lab result is older than the maximum 21 days allowed.)   Physical Exam:    Wt Readings from Last 3 Encounters:  07/05/18 168 lb (76.2 kg)  06/25/18 166 lb 12.8 oz (75.7 kg)  12/13/17 164 lb 3.2 oz (74.5 kg)    BP (!) 102/58   Pulse 66   Ht 5\' 6"  (1.676 m)   Wt 168 lb (76.2 kg)   BMI 27.12 kg/m  Constitutional:  Pleasant male in no acute distress. Psychiatric: Normal mood and affect. Behavior is  normal. EENT: Pupils normal.  Conjunctivae are normal. No scleral icterus. Neck supple.  Cardiovascular: Normal rate, regular rhythm. No edema Pulmonary/chest: Effort normal and breath sounds normal. No wheezing, rales or rhonchi. Abdominal: Soft, nondistended, nontender. Bowel sounds active throughout. There are no masses palpable. No hepatomegaly. Neurological: Alert and oriented to person place and time. Skin: Skin is warm and dry. No rashes noted.  Tye Savoy, NP  07/05/2018, 3:20 PM  Cc: Esaw Grandchild, NP

## 2018-07-06 ENCOUNTER — Other Ambulatory Visit: Payer: Self-pay | Admitting: Adult Health

## 2018-07-06 ENCOUNTER — Encounter: Payer: Self-pay | Admitting: Nurse Practitioner

## 2018-07-13 ENCOUNTER — Encounter: Payer: Self-pay | Admitting: Gastroenterology

## 2018-07-13 ENCOUNTER — Ambulatory Visit (AMBULATORY_SURGERY_CENTER): Payer: Medicare HMO | Admitting: Gastroenterology

## 2018-07-13 VITALS — BP 125/59 | HR 62 | Temp 98.0°F | Resp 12 | Ht 66.0 in | Wt 168.0 lb

## 2018-07-13 DIAGNOSIS — R195 Other fecal abnormalities: Secondary | ICD-10-CM

## 2018-07-13 DIAGNOSIS — K648 Other hemorrhoids: Secondary | ICD-10-CM

## 2018-07-13 DIAGNOSIS — K2970 Gastritis, unspecified, without bleeding: Secondary | ICD-10-CM

## 2018-07-13 DIAGNOSIS — K5730 Diverticulosis of large intestine without perforation or abscess without bleeding: Secondary | ICD-10-CM

## 2018-07-13 DIAGNOSIS — D122 Benign neoplasm of ascending colon: Secondary | ICD-10-CM

## 2018-07-13 DIAGNOSIS — K219 Gastro-esophageal reflux disease without esophagitis: Secondary | ICD-10-CM

## 2018-07-13 DIAGNOSIS — K2270 Barrett's esophagus without dysplasia: Secondary | ICD-10-CM

## 2018-07-13 MED ORDER — SODIUM CHLORIDE 0.9 % IV SOLN: 500.0000 mL | Freq: Once | INTRAVENOUS | Status: DC

## 2018-07-13 NOTE — Patient Instructions (Signed)
YOU HAD AN ENDOSCOPIC PROCEDURE TODAY AT [facility]:   Refer to the procedure report that was given to you for any specific questions about what was found during the examination.  If the procedure report does not answer your questions, please call your gastroenterologist to clarify.  If you requested that your care partner not be given the details of your procedure findings, then the procedure report has been included in a sealed envelope for you to review at your convenience later.  YOU SHOULD EXPECT: Some feelings of bloating in the abdomen. Passage of more gas than usual.  Walking can help get rid of the air that was put into your GI tract during the procedure and reduce the bloating. If you had a lower endoscopy (such as a colonoscopy or flexible sigmoidoscopy) you may notice spotting of blood in your stool or on the toilet paper. If you underwent a bowel prep for your procedure, you may not have a normal bowel movement for a few days.  Please Note:  You might notice some irritation and congestion in your nose or some drainage.  This is from the oxygen used during your procedure.  There is no need for concern and it should clear up in a day or so.  SYMPTOMS TO REPORT IMMEDIATELY:   Following lower endoscopy (colonoscopy or flexible sigmoidoscopy):  Excessive amounts of blood in the stool  Significant tenderness or worsening of abdominal pains  Swelling of the abdomen that is new, acute  Fever of 100F or higher   Following upper endoscopy (EGD)  Vomiting of blood or coffee ground material  New chest pain or pain under the shoulder blades  Painful or persistently difficult swallowing  New shortness of breath  Fever of 100F or higher  Black, tarry-looking stools  For urgent or emergent issues, a gastroenterologist can be reached at any hour by calling [phone].   DIET:  We do recommend a small meal at first, but then you may proceed to your regular diet.  Drink  plenty of fluids but you should avoid alcoholic beverages for 24 hours.  ACTIVITY:  You should plan to take it easy for the rest of today and you should NOT DRIVE or use heavy machinery until tomorrow (because of the sedation medicines used during the test).    FOLLOW UP: Our staff will call the number listed on your records the next business day following your procedure to check on you and address any questions or concerns that you may have regarding the information given to you following your procedure. If we do not reach you, we will leave a message.  However, if you are feeling well and you are not experiencing any problems, there is no need to return our call.  We will assume that you have returned to your regular daily activities without incident.  If any biopsies were taken you will be contacted by phone or by letter within the next 1-3 weeks.  Please call us at [phone] if you have not heard about the biopsies in 3 weeks.    SIGNATURES/CONFIDENTIALITY: You and/or your care partner have signed paperwork which will be entered into your electronic medical record.  These signatures attest to the fact that that the information above on your After Visit Summary has been reviewed and is understood.  Full responsibility of the confidentiality of this discharge information lies with you and/or your care-partner.  Hiatal hernia and anti reflux information given.  Use omeprazole/prilosec 20mg .  Daily.  Polyp information given  Diverticulosis information given.  Hemorrhoid information given.

## 2018-07-13 NOTE — Op Note (Signed)
[facility] Patient Name: Dustin Ferrell Procedure Date: 07/13/2018 12:59 PM MRN: [id number] Endoscopist: Mauri Pole , MD Age: 73 Referring MD:  Date of Birth: 05/01/1946 Gender: Male Account #: [id number] Procedure:                Upper GI endoscopy Indications:              Gastrointestinal bleeding of unknown origin Medicines:                Monitored Anesthesia Care Procedure:                Pre-Anesthesia Assessment:                           - Prior to the procedure, a History and Physical                            was performed, and patient medications and                            allergies were reviewed. The patient's tolerance of                            previous anesthesia was also reviewed. The risks                            and benefits of the procedure and the sedation                            options and risks were discussed with the patient.                            All questions were answered, and informed consent                            was obtained. Prior Anticoagulants: The patient has                            taken no previous anticoagulant or antiplatelet                            agents. ASA Grade Assessment: III - A patient with                            severe systemic disease. After reviewing the risks                            and benefits, the patient was deemed in                            satisfactory condition to undergo the procedure.                           After obtaining informed consent, the endoscope was  passed under direct vision. Throughout the                            procedure, the patient's blood pressure, pulse, and                            oxygen saturations were monitored continuously. The                            Model GIF-[id number]) scope was introduced                            through the mouth, and advanced to the second part                            of  duodenum. The upper GI endoscopy was                            accomplished without difficulty. The patient                            tolerated the procedure well. Scope In: Scope Out: Findings:                 The esophagus and gastroesophageal junction were                            examined with white light and narrow band imaging                            (NBI) from a forward view and retroflexed position.                            There were esophageal mucosal changes suggestive of                            long-segment Barrett's esophagus. These changes                            involved the mucosa at the upper extent of the                            gastric folds (38 cm from the incisors) extending                            to the Z-line (30 cm from the incisors).                            Circumferential salmon-colored mucosa was present                            at 30 cm. The maximum longitudinal extent of these  esophageal mucosal changes was 8 cm in length.                            Mucosa was biopsied with a cold forceps for                            histology randomly at intervals of 1 cm. One                            specimen bottle was sent to pathology.                           A small hiatal hernia was present.                           Patchy mild inflammation characterized by                            congestion (edema), erosions and erythema was found                            in the entire examined stomach. Biopsies were taken                            with a cold forceps for Helicobacter pylori testing.                           A few localized erosions without bleeding were                            found in the second portion of the duodenum. Complications:            No immediate complications. Estimated Blood Loss:     Estimated blood loss was minimal. Impression:               - Esophageal mucosal changes suggestive  of                            long-segment Barrett's esophagus. Biopsied.                           - Small hiatal hernia.                           - Gastritis. Biopsied.                           - Duodenal erosions without bleeding. Recommendation:           - Patient has a contact number available for                            emergencies. The signs and symptoms of potential                            delayed complications were discussed  with the                            patient. Return to normal activities tomorrow.                            Written discharge instructions were provided to the                            patient.                           - Resume previous diet.                           - Continue present medications.                           - Use Prilosec (omeprazole) 20 mg PO daily.                           - Follow an antireflux regimen indefinitely.                           - Await pathology results. Mauri Pole, MD 07/13/2018 1:42:10 PM This report has been signed electronically.

## 2018-07-13 NOTE — Progress Notes (Signed)
Report to PACU, RN, vss, BBS= Clear.  

## 2018-07-13 NOTE — Progress Notes (Signed)
Called to room to assist during endoscopic procedure.  Patient ID and intended procedure confirmed with present staff. Received instructions for my participation in the procedure from the performing physician.  

## 2018-07-13 NOTE — Op Note (Signed)
[facility] Patient Name: Dustin Ferrell Procedure Date: 07/13/2018 12:59 PM MRN: [id number] Endoscopist: Mauri Pole , MD Age: 73 Referring MD:  Date of Birth: 18-Oct-1945 Gender: Male Account #: [id number] Procedure:                Colonoscopy Indications:              Evaluation of unexplained GI bleeding Medicines:                Monitored Anesthesia Care Procedure:                Pre-Anesthesia Assessment:                           - Prior to the procedure, a History and Physical                            was performed, and patient medications and                            allergies were reviewed. The patient's tolerance of                            previous anesthesia was also reviewed. The risks                            and benefits of the procedure and the sedation                            options and risks were discussed with the patient.                            All questions were answered, and informed consent                            was obtained. Prior Anticoagulants: The patient has                            taken no previous anticoagulant or antiplatelet                            agents. ASA Grade Assessment: III - A patient with                            severe systemic disease. After reviewing the risks                            and benefits, the patient was deemed in                            satisfactory condition to undergo the procedure.                           After obtaining informed consent, the colonoscope  was passed under direct vision. Throughout the                            procedure, the patient's blood pressure, pulse, and                            oxygen saturations were monitored continuously. The                            Colonoscope was introduced through the anus and                            advanced to the the cecum, identified by                            appendiceal orifice and  ileocecal valve. The                            colonoscopy was performed without difficulty. The                            patient tolerated the procedure well. The quality                            of the bowel preparation was excellent. The                            ileocecal valve, appendiceal orifice, and rectum                            were photographed. Scope In: 1:18:15 PM Scope Out: 1:35:31 PM Scope Withdrawal Time: 0 hours 12 minutes 20 seconds  Total Procedure Duration: 0 hours 17 minutes 16 seconds  Findings:                 The perianal and digital rectal examinations were                            normal.                           A single small localized angioectasia without                            bleeding was found in the cecum.                           A 8 mm polyp was found in the ascending colon. The                            polyp was sessile. The polyp was removed with a                            cold snare. Resection and retrieval were complete.  Scattered small and large-mouthed diverticula were                            found in the sigmoid colon, descending colon,                            transverse colon and ascending colon.                           Non-bleeding internal hemorrhoids were found during                            retroflexion. The hemorrhoids were medium-sized. Complications:            No immediate complications. Estimated Blood Loss:     Estimated blood loss was minimal. Impression:               - A single non-bleeding colonic angioectasia.                           - One 8 mm polyp in the ascending colon, removed                            with a cold snare. Resected and retrieved.                           - Mild diverticulosis in the sigmoid colon, in the                            descending colon, in the transverse colon and in                            the ascending colon.                            - Non-bleeding internal hemorrhoids. Recommendation:           - Patient has a contact number available for                            emergencies. The signs and symptoms of potential                            delayed complications were discussed with the                            patient. Return to normal activities tomorrow.                            Written discharge instructions were provided to the                            patient.                           - Resume previous diet.                           -  Continue present medications.                           - Await pathology results.                           - Repeat colonoscopy date to be determined after                            pending pathology results are reviewed for                            surveillance based on pathology results. Mauri Pole, MD 07/13/2018 1:44:54 PM This report has been signed electronically.

## 2018-07-16 ENCOUNTER — Telehealth: Payer: Self-pay | Admitting: *Deleted

## 2018-07-16 NOTE — Telephone Encounter (Signed)
  Follow up Call-  Call back number 07/13/2018  Post procedure Call Back phone  # [phone]- wife  Permission to leave phone message Yes  Some recent data might be hidden     Patient questions:  Do you have a fever, pain , or abdominal swelling? No. Pain Score  0 *  Have you tolerated food without any problems? Yes.    Have you been able to return to your normal activities? No.  Do you have any questions about your discharge instructions: Diet   No. Medications  No. Follow up visit  No.  Do you have questions or concerns about your Care? No.  Actions: * If pain score is 4 or above: No action needed, pain <4.

## 2018-07-23 NOTE — Progress Notes (Signed)
Reviewed and agree with documentation and assessment and plan. K. Veena Nandigam , MD   

## 2018-07-27 ENCOUNTER — Encounter: Payer: Self-pay | Admitting: Gastroenterology

## 2018-08-18 ENCOUNTER — Other Ambulatory Visit: Payer: Self-pay | Admitting: Adult Health

## 2018-08-18 DIAGNOSIS — Z794 Long term (current) use of insulin: Principal | ICD-10-CM

## 2018-08-18 DIAGNOSIS — E089 Diabetes mellitus due to underlying condition without complications: Secondary | ICD-10-CM

## 2018-08-30 ENCOUNTER — Other Ambulatory Visit: Payer: Self-pay | Admitting: Adult Health

## 2018-10-01 ENCOUNTER — Other Ambulatory Visit: Payer: Self-pay | Admitting: Nurse Practitioner

## 2018-11-14 ENCOUNTER — Telehealth: Payer: Self-pay | Admitting: Adult Health

## 2018-11-14 NOTE — Telephone Encounter (Signed)
Patient's wife called to set up lab appt (states his [facility] doctor [facility]) should have sent  LAB order to [facility] to be drawn here at [facility]---   Unable to locate any orders---forwarding message to medical assistant for research (they maybe in Epic orders viewable to pt's chart to MA/provider only)  ask that if found to contact pt for Appt establishing.  --glh

## 2018-11-14 NOTE — Telephone Encounter (Signed)
Good Evening [name], Please call [facility]- Dr. Suanne Marker Ask them to fax over lab rec's We can complete here then fax results to Neurologist. Thanks! [name]

## 2018-11-14 NOTE — Telephone Encounter (Signed)
Please advise 

## 2018-11-15 NOTE — Telephone Encounter (Signed)
I called his Neurologist and was directed to the nurse's line where I left a message to return call.

## 2018-11-19 ENCOUNTER — Telehealth: Payer: Self-pay | Admitting: Adult Health

## 2018-11-19 NOTE — Telephone Encounter (Signed)
Patient's wife/ Mickel Baas called to request refill on  :   lisinopril (PRINIVIL,ZESTRIL) 10 MG tablet [[id number]]   Order Details Dose: 10 mg Route: Oral Frequency: Daily  Dispense Quantity: 90 tablet Refills: 3 Fills remaining: --        Sig: Take 1 tablet (10 mg total) by mouth daily.     -- Forwarding request to medical asst that Pharmacy changed to [facility] in [place] have updated  Pt's chart.  --glh

## 2018-11-19 NOTE — Telephone Encounter (Signed)
LVM informing pt's spouse that he should have refills on file at his pharmacy and that she should contact the pharmacy.  Charyl Bigger, CMA

## 2018-11-21 ENCOUNTER — Other Ambulatory Visit: Payer: Medicare HMO

## 2018-11-21 ENCOUNTER — Other Ambulatory Visit: Payer: Self-pay

## 2018-11-21 DIAGNOSIS — Z5181 Encounter for therapeutic drug level monitoring: Secondary | ICD-10-CM

## 2018-11-22 LAB — VALPROIC ACID LEVEL: Valproic Acid Lvl: 68 ug/mL (ref 50–100)

## 2018-12-25 ENCOUNTER — Ambulatory Visit: Payer: Medicare HMO | Admitting: Adult Health

## 2018-12-27 ENCOUNTER — Ambulatory Visit: Payer: Medicare HMO | Admitting: Adult Health

## 2019-03-14 ENCOUNTER — Other Ambulatory Visit: Payer: Medicare HMO

## 2019-03-14 ENCOUNTER — Other Ambulatory Visit: Payer: Self-pay

## 2019-03-14 DIAGNOSIS — E7800 Pure hypercholesterolemia, unspecified: Secondary | ICD-10-CM

## 2019-03-14 DIAGNOSIS — E1159 Type 2 diabetes mellitus with other circulatory complications: Secondary | ICD-10-CM

## 2019-03-14 DIAGNOSIS — E089 Diabetes mellitus due to underlying condition without complications: Secondary | ICD-10-CM

## 2019-03-14 DIAGNOSIS — Z794 Long term (current) use of insulin: Secondary | ICD-10-CM

## 2019-03-14 DIAGNOSIS — Z0000 Encounter for general adult medical examination without abnormal findings: Secondary | ICD-10-CM

## 2019-03-14 DIAGNOSIS — E559 Vitamin D deficiency, unspecified: Secondary | ICD-10-CM

## 2019-03-14 DIAGNOSIS — I10 Essential (primary) hypertension: Secondary | ICD-10-CM

## 2019-03-14 DIAGNOSIS — I152 Hypertension secondary to endocrine disorders: Secondary | ICD-10-CM

## 2019-03-14 DIAGNOSIS — R5383 Other fatigue: Secondary | ICD-10-CM

## 2019-03-15 ENCOUNTER — Encounter: Payer: Self-pay | Admitting: Adult Health

## 2019-03-15 ENCOUNTER — Other Ambulatory Visit: Payer: Self-pay | Admitting: Adult Health

## 2019-03-15 DIAGNOSIS — E559 Vitamin D deficiency, unspecified: Secondary | ICD-10-CM

## 2019-03-15 LAB — HEMOGLOBIN A1C
Est. average glucose Bld gHb Est-mCnc: 134 mg/dL
Hgb A1c MFr Bld: 6.3 % — ABNORMAL HIGH (ref 4.8–5.6)

## 2019-03-15 LAB — COMPREHENSIVE METABOLIC PANEL
ALT: 25 IU/L (ref 0–44)
AST: 21 IU/L (ref 0–40)
Albumin/Globulin Ratio: 2 (ref 1.2–2.2)
Albumin: 4.7 g/dL (ref 3.7–4.7)
Alkaline Phosphatase: 52 IU/L (ref 39–117)
BUN/Creatinine Ratio: 26 — ABNORMAL HIGH (ref 10–24)
BUN: 21 mg/dL (ref 8–27)
Bilirubin Total: 0.2 mg/dL (ref 0.0–1.2)
CO2: 24 mmol/L (ref 20–29)
Calcium: 9.9 mg/dL (ref 8.6–10.2)
Chloride: 100 mmol/L (ref 96–106)
Creatinine, Ser: 0.81 mg/dL (ref 0.76–1.27)
GFR calc Af Amer: 101 mL/min/{1.73_m2} (ref >59)
GFR calc non Af Amer: 87 mL/min/{1.73_m2} (ref >59)
Globulin, Total: 2.3 g/dL (ref 1.5–4.5)
Glucose: 112 mg/dL — ABNORMAL HIGH (ref 65–99)
Potassium: 5.6 mmol/L — ABNORMAL HIGH (ref 3.5–5.2)
Sodium: 139 mmol/L (ref 134–144)
Total Protein: 7 g/dL (ref 6.0–8.5)

## 2019-03-15 LAB — LIPID PANEL
Chol/HDL Ratio: 2.2 ratio (ref 0.0–5.0)
Cholesterol, Total: 156 mg/dL (ref 100–199)
HDL: 70 mg/dL (ref >39)
LDL Chol Calc (NIH): 73 mg/dL (ref 0–99)
Triglycerides: 67 mg/dL (ref 0–149)
VLDL Cholesterol Cal: 13 mg/dL (ref 5–40)

## 2019-03-15 LAB — CBC WITH DIFFERENTIAL/PLATELET
Basophils Absolute: 0.1 10*3/uL (ref 0.0–0.2)
Basos: 1 %
EOS (ABSOLUTE): 0.3 10*3/uL (ref 0.0–0.4)
Eos: 3 %
Hematocrit: 41 % (ref 37.5–51.0)
Hemoglobin: 14 g/dL (ref 13.0–17.7)
Immature Grans (Abs): 0 10*3/uL (ref 0.0–0.1)
Immature Granulocytes: 0 %
Lymphocytes Absolute: 1.9 10*3/uL (ref 0.7–3.1)
Lymphs: 25 %
MCH: 31.8 pg (ref 26.6–33.0)
MCHC: 34.1 g/dL (ref 31.5–35.7)
MCV: 93 fL (ref 79–97)
Monocytes Absolute: 0.6 10*3/uL (ref 0.1–0.9)
Monocytes: 8 %
Neutrophils Absolute: 4.6 10*3/uL (ref 1.4–7.0)
Neutrophils: 63 %
Platelets: 318 10*3/uL (ref 150–450)
RBC: 4.4 x10E6/uL (ref 4.14–5.80)
RDW: 13.6 % (ref 11.6–15.4)
WBC: 7.5 10*3/uL (ref 3.4–10.8)

## 2019-03-15 LAB — VITAMIN D 25 HYDROXY (VIT D DEFICIENCY, FRACTURES): Vit D, 25-Hydroxy: 26.4 ng/mL — ABNORMAL LOW (ref 30.0–100.0)

## 2019-03-15 LAB — TSH: TSH: 1.38 u[IU]/mL (ref 0.450–4.500)

## 2019-03-15 MED ORDER — VITAMIN D (ERGOCALCIFEROL) 1.25 MG (50000 UNIT) PO CAPS: ORAL_CAPSULE | ORAL | 12 refills | Status: DC

## 2019-03-19 ENCOUNTER — Other Ambulatory Visit: Payer: Self-pay | Admitting: Adult Health

## 2019-03-20 ENCOUNTER — Other Ambulatory Visit: Payer: Self-pay | Admitting: Adult Health

## 2019-04-11 ENCOUNTER — Other Ambulatory Visit: Payer: Self-pay | Admitting: Adult Health

## 2019-04-22 ENCOUNTER — Other Ambulatory Visit: Payer: Self-pay

## 2019-04-22 ENCOUNTER — Other Ambulatory Visit: Payer: Medicare HMO

## 2019-04-22 DIAGNOSIS — E875 Hyperkalemia: Secondary | ICD-10-CM

## 2019-04-23 LAB — BASIC METABOLIC PANEL
BUN/Creatinine Ratio: 35 — ABNORMAL HIGH (ref 10–24)
BUN: 29 mg/dL — ABNORMAL HIGH (ref 8–27)
CO2: 19 mmol/L — ABNORMAL LOW (ref 20–29)
Calcium: 9.2 mg/dL (ref 8.6–10.2)
Chloride: 107 mmol/L — ABNORMAL HIGH (ref 96–106)
Creatinine, Ser: 0.84 mg/dL (ref 0.76–1.27)
GFR calc Af Amer: 99 mL/min/{1.73_m2} (ref >59)
GFR calc non Af Amer: 86 mL/min/{1.73_m2} (ref >59)
Glucose: 214 mg/dL — ABNORMAL HIGH (ref 65–99)
Potassium: 5.1 mmol/L (ref 3.5–5.2)
Sodium: 141 mmol/L (ref 134–144)

## 2019-05-16 ENCOUNTER — Other Ambulatory Visit: Payer: Self-pay | Admitting: Adult Health

## 2019-05-22 ENCOUNTER — Other Ambulatory Visit: Payer: Self-pay | Admitting: Adult Health

## 2019-05-22 DIAGNOSIS — Z794 Long term (current) use of insulin: Secondary | ICD-10-CM

## 2019-05-22 DIAGNOSIS — E089 Diabetes mellitus due to underlying condition without complications: Secondary | ICD-10-CM

## 2019-05-22 MED ORDER — TRESIBA FLEXTOUCH 100 UNIT/ML ~~LOC~~ SOPN: 14.0000 [IU] | PEN_INJECTOR | Freq: Every day | SUBCUTANEOUS | 6 refills | Status: DC

## 2019-05-22 NOTE — Progress Notes (Signed)
Goal of A1c >7

## 2019-05-27 ENCOUNTER — Telehealth: Payer: Self-pay

## 2019-05-27 DIAGNOSIS — E089 Diabetes mellitus due to underlying condition without complications: Secondary | ICD-10-CM

## 2019-05-27 MED ORDER — GLUCOSE BLOOD VI STRP: ORAL_STRIP | 12 refills | Status: DC

## 2019-05-27 MED ORDER — ACCU-CHEK SOFT TOUCH LANCETS MISC: 12 refills | Status: DC

## 2019-05-27 MED ORDER — ACCU-CHEK AVIVA DEVI: 0 refills | Status: AC

## 2019-05-27 NOTE — Telephone Encounter (Signed)
-----   Message from Esaw Grandchild, NP sent at 05/22/2019  1:22 PM EST ----- Regarding: Reduction in Tresiba Good Afternoon Kenney Houseman, Can you please call Mrs. Rochefort and share- Reduction in [place] to 14 U per day- just read Neuro notes and they recommend keeping A1c>7. Please have him schedule non-fasting labs, A1c re-check in 6 weeks. Can you please send in generic glucometer plus test strips, lancets. Thanks! [name]

## 2019-05-27 NOTE — Telephone Encounter (Signed)
Pt's spouse informed of results.  Spouse expressed understanding and is agreeable. RX sent to pharmacy for [employer].  Charyl Bigger, CMA

## 2019-06-20 ENCOUNTER — Other Ambulatory Visit: Payer: Self-pay

## 2019-06-20 ENCOUNTER — Encounter: Payer: Self-pay | Admitting: Adult Health

## 2019-06-20 ENCOUNTER — Ambulatory Visit (INDEPENDENT_AMBULATORY_CARE_PROVIDER_SITE_OTHER): Payer: Medicare HMO | Admitting: Adult Health

## 2019-06-20 VITALS — BP 151/68 | HR 77 | Temp 98.5°F | Ht 66.0 in | Wt 171.4 lb

## 2019-06-20 DIAGNOSIS — Z794 Long term (current) use of insulin: Secondary | ICD-10-CM

## 2019-06-20 DIAGNOSIS — M25551 Pain in right hip: Secondary | ICD-10-CM

## 2019-06-20 DIAGNOSIS — R918 Other nonspecific abnormal finding of lung field: Secondary | ICD-10-CM

## 2019-06-20 DIAGNOSIS — G8929 Other chronic pain: Secondary | ICD-10-CM

## 2019-06-20 DIAGNOSIS — Z0000 Encounter for general adult medical examination without abnormal findings: Secondary | ICD-10-CM

## 2019-06-20 DIAGNOSIS — E089 Diabetes mellitus due to underlying condition without complications: Secondary | ICD-10-CM | POA: Diagnosis not present

## 2019-06-20 DIAGNOSIS — E1129 Type 2 diabetes mellitus with other diabetic kidney complication: Secondary | ICD-10-CM

## 2019-06-20 LAB — POCT UA - MICROALBUMIN
Creatinine, POC: 200 mg/dL
Microalbumin Ur, POC: 80 mg/L

## 2019-06-20 LAB — POCT GLYCOSYLATED HEMOGLOBIN (HGB A1C): Hemoglobin A1C: 6.9 % — AB (ref 4.0–5.6)

## 2019-06-20 MED ORDER — LISINOPRIL 20 MG PO TABS: 20.0000 mg | ORAL_TABLET | Freq: Every day | ORAL | 0 refills | Status: DC

## 2019-06-20 MED ORDER — TRESIBA FLEXTOUCH 100 UNIT/ML ~~LOC~~ SOPN: 10.0000 [IU] | PEN_INJECTOR | Freq: Every day | SUBCUTANEOUS | 6 refills | Status: DC

## 2019-06-20 NOTE — Progress Notes (Signed)
Subjective:    Patient ID: Dustin Ferrell, male    DOB: 1946/05/25, 74 y.o.   MRN: [id number]  HPI:  Mr. Denner is here for CPE. He reports medication compliance, denies medication SE. He reports AM BG 110-130s, he denies episodes of hypoglycemia. He has not been checking BP/HR regularly at home. He denies acute cardiac sx's. He reports increased R hip pain- worsening last yr. Pain present >10 yrs. He reports inability to walk long distances due to pain. He denies numbness/tingling in R foot. He denies recent falls. He continues to use E Cigarette. He reports drinking 1 beer per night- encouraged to keep to only one beer- no more.  Healthcare Maintenance: AAA Screening-07/27/2017, repeat 5 yrs  LDCT-07/27/2017- order placed Colonoscopy-07/13/2018- final due to age Immunizations- received first COVID-19 vaccine, needs to hold on   Review of Systems  Constitutional: Positive for fatigue. Negative for activity change, appetite change, chills, diaphoresis, fever and unexpected weight change.  HENT: Negative for congestion.   Eyes: Negative for visual disturbance.  Respiratory: Negative for cough, chest tightness, shortness of breath, wheezing and stridor.   Cardiovascular: Negative for chest pain, palpitations and leg swelling.  Gastrointestinal: Negative for abdominal distention, abdominal pain, blood in stool, constipation, diarrhea, nausea and vomiting.  Endocrine: Negative for polydipsia, polyphagia and polyuria.  Genitourinary: Negative for difficulty urinating and flank pain.  Musculoskeletal: Positive for arthralgias, gait problem, joint swelling and myalgias.  Neurological: Negative for dizziness.  Psychiatric/Behavioral: Negative for agitation, behavioral problems, confusion, decreased concentration, dysphoric mood, hallucinations, self-injury, sleep disturbance and suicidal ideas. The patient is not nervous/anxious and is not hyperactive.        Objective:   Physical  Exam Vitals and nursing note reviewed.  Constitutional:      General: He is not in acute distress.    Appearance: Normal appearance. He is normal weight. He is not ill-appearing, toxic-appearing or diaphoretic.  HENT:     Head: Normocephalic and atraumatic.     Right Ear: Tympanic membrane, ear canal and external ear normal. There is no impacted cerumen.     Left Ear: Tympanic membrane, ear canal and external ear normal. There is no impacted cerumen.     Nose: Nose normal. No congestion.  Eyes:     Extraocular Movements: Extraocular movements intact.     Conjunctiva/sclera: Conjunctivae normal.     Pupils: Pupils are equal, round, and reactive to light.  Cardiovascular:     Rate and Rhythm: Normal rate and regular rhythm.     Pulses: Normal pulses.     Heart sounds: Normal heart sounds.  Pulmonary:     Effort: Pulmonary effort is normal. No respiratory distress.     Breath sounds: Normal breath sounds. No stridor. No wheezing, rhonchi or rales.  Chest:     Chest wall: No tenderness.  Abdominal:     General: Abdomen is flat. Bowel sounds are normal. There is no distension.     Palpations: Abdomen is soft.     Tenderness: There is no abdominal tenderness. There is no right CVA tenderness, left CVA tenderness, guarding or rebound.     Hernia: No hernia is present.  Musculoskeletal:        General: Tenderness present.     Right upper leg: Tenderness and bony tenderness present. No swelling.     Right lower leg: No edema.     Left lower leg: Normal. No edema.  Skin:    General: Skin is warm and dry.  Capillary Refill: Capillary refill takes less than 2 seconds.  Neurological:     Mental Status: He is alert and oriented to person, place, and time.     Motor: Tremor present.     Coordination: Coordination normal.  Psychiatric:        Attention and Perception: Attention and perception normal.        Mood and Affect: Mood normal.        Speech: Speech normal.        Behavior:  Behavior normal.        Thought Content: Thought content normal.        Cognition and Memory: Memory is impaired. He exhibits impaired recent memory and impaired remote memory.        Judgment: Judgment normal.       Assessment & Plan:   1. 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 Chronic right hip pain   3. Healthcare maintenance   4. 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 Please check blood pressure (BP) and heart rate daily- record. Please call clinic is BP is consistently >150/90 or <100/60. Reduce Tresiba from 14 U to 10 U- A1c is 6.9 and your Neurologist wants it >7. Continue to check your fasting blood glucose and call clinic if it is consistently >150 or ever <80. Remain well hydrated, follow diabetic diet, remain as active as tolerated (right hip pain). Referral placed to Orthopedic Specialist to address right hip pain. Continue to social distance and wear a mask when in public. Complete your COVID-19 vaccine series 5 Feb. Follow-up here in 1 month, re: Blood Pressure. 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 Continue to check your fasting blood glucose and call  clinic if it is consistently >150 or ever <80.     FOLLOW-UP:  Return in about 4 weeks (around 07/18/2019) for HTN, Evaluate Medication Effectiveness, Fasting Labs.

## 2019-06-20 NOTE — Assessment & Plan Note (Signed)
Lisinopril increased from 10mg  to 20mg  QD

## 2019-06-20 NOTE — Assessment & Plan Note (Addendum)
Continue all medications as directed, with these two changes- Due to abnormal microalbumin- increased Lisinopril from 10mg  to 20mg  once daily. Please check blood pressure (BP) and heart rate daily- record. Please call clinic is BP is consistently >150/90 or <100/60. Reduce Tresiba from 14 U to 10 U- A1c is 6.9 and your Neurologist wants it >7. Continue to check your fasting blood glucose and call clinic if it is consistently >150 or ever <80. Remain well hydrated, follow diabetic diet, remain as active as tolerated (right hip pain). Referral placed to Orthopedic Specialist to address right hip pain. Continue to social distance and wear a mask when in public. Complete your COVID-19 vaccine series 5 Feb. Follow-up here in 1 month, re: Blood Pressure. come fasting- labs: CMP/CBC/TSH/Vit D/Mg++/B12

## 2019-06-20 NOTE — Patient Instructions (Addendum)

## 2019-06-20 NOTE — Assessment & Plan Note (Signed)
Lab Results  Component Value Date   HGBA1C 6.9 (A) 06/20/2019   HGBA1C 6.3 (H) 03/14/2019   HGBA1C 6.7 (H) 05/15/2018  Continue all medications as directed, with these two changes- Continue Metformin 1000mg  BID, Januvia 100mg  QD Reduce Tresiba from 14 U to 10 U- A1c is 6.9 and your Neurologist wants it >7. Continue to check your fasting blood glucose and call clinic if it is consistently >150 or ever <80.

## 2019-06-27 ENCOUNTER — Ambulatory Visit: Payer: Medicare HMO | Admitting: Orthopaedic Surgery

## 2019-06-28 ENCOUNTER — Ambulatory Visit: Payer: Medicare HMO

## 2019-07-08 ENCOUNTER — Ambulatory Visit: Payer: Medicare HMO

## 2019-07-10 NOTE — Progress Notes (Signed)
Subjective:    Patient ID: Dustin Ferrell, male    DOB: March 21, 1946, 74 y.o.   MRN: [id number]  HPI:06/20/2019 OV:  Mr. Saxman is here for CPE. He reports medication compliance, denies medication SE. He reports AM BG 110-130s, he denies episodes of hypoglycemia. He has not been checking BP/HR regularly at home. He denies acute cardiac sx's. He reports increased R hip pain- worsening last yr. Pain present >10 yrs. He reports inability to walk long distances due to pain. He denies numbness/tingling in R foot. He denies recent falls. He continues to use E Cigarette. He reports drinking 1 beer per night- encouraged to keep to only one beer- no more.  07/11/2019 OV:  Mr. Reineck is here for f/u: 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) Lisinopril increased from 10mg  to 20mg  QD Amubulatory BP SBP 120-130s DBP 60-70s HR 60-80s He denies chest pain/tightness with exertion. He denies HA/dizziness. He has not been checking BG at home- encourage to check at least 3 times weekly or if he ever "feels off". Discussed ETOH use- encouraged to keep to one beer max per day- then change to non-alcoholic options.  Lab Results  Component Value Date   HGBA1C 6.9 (A) 06/20/2019   HGBA1C 6.3 (H) 03/14/2019   HGBA1C 6.7 (H) 05/15/2018    Patient Care Team    Relationship Specialty Notifications Start End  Esaw Grandchild, NP PCP - General Family Medicine  04/06/17   Pa, [facility]  Optometry  05/04/17   Gastroenterology, [facility]  Gastroenterology  05/04/17     Patient Active Problem List   Diagnosis Date Noted  . Elevated LDL cholesterol level 06/25/2018  . Healthcare maintenance 12/13/2017  . Encounter for screening for malignant neoplasm of respiratory organs 07/12/2017  . Overgrown toenails 07/12/2017  . Vitamin D insufficiency 04/28/2017  . Closed fracture of one rib of left side 04/28/2017  . 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  mellitus ([place]) 04/28/2017  . Diabetes mellitus due to underlying condition without complication, with long-term current use of insulin ([place]) 04/06/2017  . Elbow swelling, left 04/06/2017  . 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 Fatigue 04/06/2017  . Hypertension associated with diabetes ([place]) 04/06/2017  . Screening for AAA (abdominal aortic aneurysm) 04/06/2017  . 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 Neurological deficit, transient 10/15/2014  . Obstructive sleep apnea- noncompliance w txmnt 10/15/2014  . Syncope 10/03/2014  . 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 Colon polyps 07/07/2014  . Personal history of traumatic brain injury 07/07/2014     Past Medical History:  Diagnosis Date  . 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 Dementia with behavioral disturbance ([place])   . Diabetes mellitus without complication ([place])   . History of traumatic head injury   . Hypertension      Past Surgical History:  Procedure Laterality Date  . Eye Socket fracture repair Right   . HERNIA REPAIR    . ROTATOR CUFF REPAIR Left   . TONSILLECTOMY    . WRIST FRACTURE SURGERY Right      Family History  Problem Relation Age of Onset  . Stomach cancer Mother   . Heart attack Father   . Diabetes Father   . Colon cancer Neg Hx      Social History   Substance and Sexual Activity  Drug Use No     Social  History   Substance and Sexual Activity  Alcohol Use Yes  . Alcohol/week: 18.0 standard drinks  . Types: 18 Glasses of wine per week   Comment: Has a few drinks in the evening 2-3 beers     Social History   Tobacco Use  Smoking Status Current Every Day Smoker  . Packs/day: 0.25  . Years: 60.00  . Pack years: 15.00  . Types: Cigarettes  Smokeless Tobacco Never Used     Outpatient Encounter Medications as of 07/11/2019  Medication Sig  . aspirin  EC 81 MG tablet Take 81 mg by mouth daily.  Marland Kitchen atorvastatin (LIPITOR) 10 MG tablet Take 1 tablet (10 mg total) by mouth daily.  . Blood Glucose Monitoring Suppl (ACCU-CHEK AVIVA) device Use to check blood sugars every morning fasting and 2 hours after largest meal  . divalproex (DEPAKOTE ER) 500 MG 24 hr tablet TAKE 1 TABLET BY MOUTH TWICE DAILY  . glucose blood test strip Use to check blood sugars every morning fasting and 2 hours after largest meal  . insulin degludec (TRESIBA FLEXTOUCH) 100 UNIT/ML SOPN FlexTouch Pen Inject 0.1 mLs (10 Units total) into the skin daily.  Marland Kitchen JANUVIA 100 MG tablet TAKE 1 TABLET (100 MG TOTAL) DAILY BY MOUTH.  . Lancets (ACCU-CHEK SOFT TOUCH) lancets Use to check blood sugars every morning fasting and 2 hours after largest meal  . lisinopril (ZESTRIL) 20 MG tablet Take 1 tablet (20 mg total) by mouth daily.  . metFORMIN (GLUCOPHAGE) 1000 MG tablet TAKE 1 TABLET BY MOUTH TWICE DAILY WITH MEALS  . omeprazole (PRILOSEC) 20 MG capsule TAKE 1 CAPSULE (20 MG TOTAL) BY MOUTH EVERY EVENING. TAKE 30 MINUTES PIROR TO DINNER  . sertraline (ZOLOFT) 100 MG tablet Take 200 mg daily by mouth.  . traZODone (DESYREL) 50 MG tablet Take 50 mg by mouth daily.  . Vitamin D, Ergocalciferol, (DRISDOL) 1.25 MG (50000 UT) CAPS capsule Take Monday and Wednesday  . [DISCONTINUED] lisinopril (ZESTRIL) 20 MG tablet Take 1 tablet (20 mg total) by mouth daily.  . [DISCONTINUED] traZODone (DESYREL) 50 MG tablet Take 50 mg 2 (two) times daily by mouth.   No facility-administered encounter medications on file as of 07/11/2019.    Allergies: Patient has no known allergies.  Body mass index is 27.42 kg/m.  Blood pressure 116/62, pulse 67, temperature (!) 97.5 F (36.4 C), temperature source Oral, height 5\' 6"  (1.676 m), weight 169 lb 14.4 oz (77.1 kg), SpO2 99 %. Review of Systems  Constitutional: Positive for fatigue. Negative for activity change, appetite change, chills, diaphoresis, fever  and unexpected weight change.  HENT: Negative for congestion.   Eyes: Negative for visual disturbance.  Respiratory: Negative for cough, chest tightness, shortness of breath, wheezing and stridor.   Cardiovascular: Negative for chest pain, palpitations and leg swelling.  Gastrointestinal: Negative for abdominal distention, abdominal pain, blood in stool, constipation, diarrhea, nausea and vomiting.  Endocrine: Negative for polydipsia, polyphagia and polyuria.  Neurological: Negative for dizziness and headaches.  Hematological: Negative for adenopathy. Does not bruise/bleed easily.  Psychiatric/Behavioral: Negative for agitation, behavioral problems, confusion, decreased concentration, dysphoric mood, hallucinations, self-injury, sleep disturbance and suicidal ideas. The patient is not nervous/anxious and is not hyperactive.        Objective:   Physical Exam Vitals and nursing note reviewed.  Constitutional:      General: He is not in acute distress.    Appearance: Normal appearance. He is normal weight. He is not ill-appearing, toxic-appearing or diaphoretic.  HENT:     Head: Normocephalic and atraumatic.  Eyes:     Extraocular Movements: Extraocular movements intact.     Conjunctiva/sclera: Conjunctivae normal.     Pupils: Pupils are equal, round, and reactive to light.  Cardiovascular:     Rate and Rhythm: Normal rate and regular rhythm.     Pulses: Normal pulses.     Heart sounds: Normal heart sounds. No murmur. No friction rub. No gallop.   Pulmonary:     Effort: Pulmonary effort is normal. No respiratory distress.     Breath sounds: Normal breath sounds. No stridor. No wheezing, rhonchi or rales.  Chest:     Chest wall: No tenderness.  Skin:    Capillary Refill: Capillary refill takes less than 2 seconds.  Neurological:     Mental Status: He is alert and oriented to person, place, and time.     Motor: Tremor present.  Psychiatric:        Attention and Perception:  Attention and perception normal.        Mood and Affect: Mood and affect normal.        Speech: Speech normal.        Behavior: Behavior is cooperative.        Thought Content: Thought content normal.        Cognition and Memory: Memory is impaired.        Assessment & Plan:   1. Hypertension associated with diabetes ([place])   2. Diabetes mellitus due to underlying condition without complication, with long-term current use of insulin ([place])   3. Vitamin D insufficiency   4. Elevated LDL cholesterol level   5. Healthcare maintenance     Healthcare maintenance  Continue all medications as directed. Continue to check blood pressure (BP) and heart rate (HR) several times per week.  Call clinic if BP consistently <110/60 or >150/90. Call clinic if fasting blood glucose ever <80 or consistently >150. Continue to social distance and wear a mask when in public. We will contact you with lab results. Follow-up with primary care in 2 months for chronic medical conditions. If has been a pleasure working with you at [facility].  Hypertension associated with diabetes ([place]) Has tolerated increase in ACE- continue Lisinopril 20mg  QD Continue to check blood pressure (BP) and heart rate (HR) several times per week.  Call clinic if BP consistently <110/60 or >150/90.  Diabetes mellitus due to underlying condition without complication, with long-term current use of insulin (HCC) Tresiba 10 U QD, Januiva 100mg  QD, Metformin 1000mg  BID Call clinic if fasting blood glucose ever <80 or consistently >150.     FOLLOW-UP:  Return in about 2 months (around 09/08/2019) for Regular Follow Up, HTN, Diabetes.

## 2019-07-11 ENCOUNTER — Encounter: Payer: Self-pay | Admitting: Adult Health

## 2019-07-11 ENCOUNTER — Ambulatory Visit (INDEPENDENT_AMBULATORY_CARE_PROVIDER_SITE_OTHER): Payer: Medicare HMO | Admitting: Adult Health

## 2019-07-11 ENCOUNTER — Other Ambulatory Visit: Payer: Self-pay

## 2019-07-11 VITALS — BP 116/62 | HR 67 | Temp 97.5°F | Ht 66.0 in | Wt 169.9 lb

## 2019-07-11 DIAGNOSIS — I10 Essential (primary) hypertension: Secondary | ICD-10-CM

## 2019-07-11 DIAGNOSIS — E559 Vitamin D deficiency, unspecified: Secondary | ICD-10-CM | POA: Diagnosis not present

## 2019-07-11 DIAGNOSIS — E089 Diabetes mellitus due to underlying condition without complications: Secondary | ICD-10-CM

## 2019-07-11 DIAGNOSIS — Z794 Long term (current) use of insulin: Secondary | ICD-10-CM

## 2019-07-11 DIAGNOSIS — Z0000 Encounter for general adult medical examination without abnormal findings: Secondary | ICD-10-CM

## 2019-07-11 DIAGNOSIS — E7800 Pure hypercholesterolemia, unspecified: Secondary | ICD-10-CM

## 2019-07-11 DIAGNOSIS — E1159 Type 2 diabetes mellitus with other circulatory complications: Secondary | ICD-10-CM

## 2019-07-11 MED ORDER — LISINOPRIL 20 MG PO TABS: 20.0000 mg | ORAL_TABLET | Freq: Every day | ORAL | 0 refills | Status: DC

## 2019-07-11 NOTE — Assessment & Plan Note (Signed)
Tresiba 10 U QD, Januiva 100mg  QD, Metformin 1000mg  BID Call clinic if fasting blood glucose ever <80 or consistently >150.

## 2019-07-11 NOTE — Assessment & Plan Note (Addendum)
Has tolerated increase in ACE- continue Lisinopril 20mg  QD Continue to check blood pressure (BP) and heart rate (HR) several times per week.  Call clinic if BP consistently <110/60 or >150/90.

## 2019-07-11 NOTE — Assessment & Plan Note (Signed)
  Continue all medications as directed. Continue to check blood pressure (BP) and heart rate (HR) several times per week.  Call clinic if BP consistently <110/60 or >150/90. Call clinic if fasting blood glucose ever <80 or consistently >150. Continue to social distance and wear a mask when in public. We will contact you with lab results. Follow-up with primary care in 2 months for chronic medical conditions. If has been a pleasure working with you at [facility].

## 2019-07-11 NOTE — Patient Instructions (Signed)
Diabetes Mellitus and Nutrition, Adult When you have diabetes (diabetes mellitus), it is very important to have healthy eating habits because your blood sugar (glucose) levels are greatly affected by what you eat and drink. Eating healthy foods in the appropriate amounts, at about the same times every day, can help you:  Control your blood glucose.  Lower your risk of heart disease.  Improve your blood pressure.  Reach or maintain a healthy weight. Every person with diabetes is different, and each person has different needs for a meal plan. Your health care provider may recommend that you work with a diet and nutrition specialist (dietitian) to make a meal plan that is best for you. Your meal plan may vary depending on factors such as:  The calories you need.  The medicines you take.  Your weight.  Your blood glucose, blood pressure, and cholesterol levels.  Your activity level.  Other health conditions you have, such as heart or kidney disease. How do carbohydrates affect me? Carbohydrates, also called carbs, affect your blood glucose level more than any other type of food. Eating carbs naturally raises the amount of glucose in your blood. Carb counting is a method for keeping track of how many carbs you eat. Counting carbs is important to keep your blood glucose at a healthy level, especially if you use insulin or take certain oral diabetes medicines. It is important to know how many carbs you can safely have in each meal. This is different for every person. Your dietitian can help you calculate how many carbs you should have at each meal and for each snack. Foods that contain carbs include:  Bread, cereal, rice, pasta, and crackers.  Potatoes and corn.  Peas, beans, and lentils.  Milk and yogurt.  Fruit and juice.  Desserts, such as cakes, cookies, ice cream, and candy. How does alcohol affect me? Alcohol can cause a sudden decrease in blood glucose (hypoglycemia),  especially if you use insulin or take certain oral diabetes medicines. Hypoglycemia can be a life-threatening condition. Symptoms of hypoglycemia (sleepiness, dizziness, and confusion) are similar to symptoms of having too much alcohol. If your health care provider says that alcohol is safe for you, follow these guidelines:  Limit alcohol intake to no more than 1 drink per day for nonpregnant women and 2 drinks per day for men. One drink equals 12 oz of beer, 5 oz of wine, or 1 oz of hard liquor.  Do not drink on an empty stomach.  Keep yourself hydrated with water, diet soda, or unsweetened iced tea.  Keep in mind that regular soda, juice, and other mixers may contain a lot of sugar and must be counted as carbs. What are tips for following this plan?  Reading food labels  Start by checking the serving size on the "Nutrition Facts" label of packaged foods and drinks. The amount of calories, carbs, fats, and other nutrients listed on the label is based on one serving of the item. Many items contain more than one serving per package.  Check the total grams (g) of carbs in one serving. You can calculate the number of servings of carbs in one serving by dividing the total carbs by 15. For example, if a food has 30 g of total carbs, it would be equal to 2 servings of carbs.  Check the number of grams (g) of saturated and trans fats in one serving. Choose foods that have low or no amount of these fats.  Check the number of   milligrams (mg) of salt (sodium) in one serving. Most people should limit total sodium intake to less than 2,300 mg per day.  Always check the nutrition information of foods labeled as "low-fat" or "nonfat". These foods may be higher in added sugar or refined carbs and should be avoided.  Talk to your dietitian to identify your daily goals for nutrients listed on the label. Shopping  Avoid buying canned, premade, or processed foods. These foods tend to be high in fat, sodium,  and added sugar.  Shop around the outside edge of the grocery store. This includes fresh fruits and vegetables, bulk grains, fresh meats, and fresh dairy. Cooking  Use low-heat cooking methods, such as baking, instead of high-heat cooking methods like deep frying.  Cook using healthy oils, such as olive, canola, or sunflower oil.  Avoid cooking with butter, cream, or high-fat meats. Meal planning  Eat meals and snacks regularly, preferably at the same times every day. Avoid going long periods of time without eating.  Eat foods high in fiber, such as fresh fruits, vegetables, beans, and whole grains. Talk to your dietitian about how many servings of carbs you can eat at each meal.  Eat 4-6 ounces (oz) of lean protein each day, such as lean meat, chicken, fish, eggs, or tofu. One oz of lean protein is equal to: ? 1 oz of meat, chicken, or fish. ? 1 egg. ?  cup of tofu.  Eat some foods each day that contain healthy fats, such as avocado, nuts, seeds, and fish. Lifestyle  Check your blood glucose regularly.  Exercise regularly as told by your health care provider. This may include: ? 150 minutes of moderate-intensity or vigorous-intensity exercise each week. This could be brisk walking, biking, or water aerobics. ? Stretching and doing strength exercises, such as yoga or weightlifting, at least 2 times a week.  Take medicines as told by your health care provider.  Do not use any products that contain nicotine or tobacco, such as cigarettes and e-cigarettes. If you need help quitting, ask your health care provider.  Work with a Social worker or diabetes educator to identify strategies to manage stress and any emotional and social challenges. Questions to ask a health care provider  Do I need to meet with a diabetes educator?  Do I need to meet with a dietitian?  What number can I call if I have questions?  When are the best times to check my blood glucose? Where to find more  information:  American Diabetes Association: diabetes.org  Academy of Nutrition and Dietetics: www.eatright.[employer] of Diabetes and Digestive and Kidney Diseases (NIH): [employer] Summary  A healthy meal plan will help you control your blood glucose and maintain a healthy lifestyle.  Working with a diet and nutrition specialist (dietitian) can help you make a meal plan that is best for you.  Keep in mind that carbohydrates (carbs) and alcohol have immediate effects on your blood glucose levels. It is important to count carbs and to use alcohol carefully. This information is not intended to replace advice given to you by your health care provider. Make sure you discuss any questions you have with your health care provider. Document Revised: 04/28/2017 Document Reviewed: 06/20/2016 Elsevier Patient Education  2020 [place] all medications as directed. Continue to check blood pressure (BP) and heart rate (HR) several times per week.  Call clinic if BP consistently <110/60 or >150/90. Call clinic if fasting blood glucose ever <80 or  consistently >150. Continue to social distance and wear a mask when in public. We will contact you with lab results. Follow-up with primary care in 2 months for chronic medical conditions. If has been a pleasure working with you at [facility]. GREAT TO SEE YOU!

## 2019-07-12 LAB — COMPREHENSIVE METABOLIC PANEL
ALT: 22 IU/L (ref 0–44)
AST: 20 IU/L (ref 0–40)
Albumin/Globulin Ratio: 2 (ref 1.2–2.2)
Albumin: 4.5 g/dL (ref 3.7–4.7)
Alkaline Phosphatase: 44 IU/L (ref 39–117)
BUN/Creatinine Ratio: 33 — ABNORMAL HIGH (ref 10–24)
BUN: 31 mg/dL — ABNORMAL HIGH (ref 8–27)
Bilirubin Total: <0.2 mg/dL (ref 0.0–1.2)
CO2: 19 mmol/L — ABNORMAL LOW (ref 20–29)
Calcium: 9.3 mg/dL (ref 8.6–10.2)
Chloride: 105 mmol/L (ref 96–106)
Creatinine, Ser: 0.94 mg/dL (ref 0.76–1.27)
GFR calc Af Amer: 91 mL/min/{1.73_m2} (ref >59)
GFR calc non Af Amer: 79 mL/min/{1.73_m2} (ref >59)
Globulin, Total: 2.2 g/dL (ref 1.5–4.5)
Glucose: 137 mg/dL — ABNORMAL HIGH (ref 65–99)
Potassium: 5.8 mmol/L — ABNORMAL HIGH (ref 3.5–5.2)
Sodium: 140 mmol/L (ref 134–144)
Total Protein: 6.7 g/dL (ref 6.0–8.5)

## 2019-07-12 LAB — CBC WITH DIFFERENTIAL/PLATELET
Basophils Absolute: 0.1 10*3/uL (ref 0.0–0.2)
Basos: 1 %
EOS (ABSOLUTE): 0.2 10*3/uL (ref 0.0–0.4)
Eos: 3 %
Hematocrit: 32.5 % — ABNORMAL LOW (ref 37.5–51.0)
Hemoglobin: 10.2 g/dL — ABNORMAL LOW (ref 13.0–17.7)
Immature Grans (Abs): 0 10*3/uL (ref 0.0–0.1)
Immature Granulocytes: 0 %
Lymphocytes Absolute: 1.3 10*3/uL (ref 0.7–3.1)
Lymphs: 23 %
MCH: 25.1 pg — ABNORMAL LOW (ref 26.6–33.0)
MCHC: 31.4 g/dL — ABNORMAL LOW (ref 31.5–35.7)
MCV: 80 fL (ref 79–97)
Monocytes Absolute: 0.5 10*3/uL (ref 0.1–0.9)
Monocytes: 8 %
Neutrophils Absolute: 3.5 10*3/uL (ref 1.4–7.0)
Neutrophils: 65 %
Platelets: 313 10*3/uL (ref 150–450)
RBC: 4.06 x10E6/uL — ABNORMAL LOW (ref 4.14–5.80)
RDW: 15 % (ref 11.6–15.4)
WBC: 5.5 10*3/uL (ref 3.4–10.8)

## 2019-07-12 LAB — HEMOGLOBIN A1C
Est. average glucose Bld gHb Est-mCnc: 166 mg/dL
Hgb A1c MFr Bld: 7.4 % — ABNORMAL HIGH (ref 4.8–5.6)

## 2019-07-12 LAB — VITAMIN D 25 HYDROXY (VIT D DEFICIENCY, FRACTURES): Vit D, 25-Hydroxy: 50.6 ng/mL (ref 30.0–100.0)

## 2019-07-12 LAB — LDL CHOLESTEROL, DIRECT: LDL Direct: 60 mg/dL (ref 0–99)

## 2019-07-12 LAB — T4, FREE: Free T4: 0.92 ng/dL (ref 0.82–1.77)

## 2019-07-12 LAB — TSH: TSH: 1.39 u[IU]/mL (ref 0.450–4.500)

## 2019-07-16 ENCOUNTER — Other Ambulatory Visit: Payer: Self-pay | Admitting: Family Medicine

## 2019-07-16 ENCOUNTER — Telehealth: Payer: Self-pay | Admitting: Family Medicine

## 2019-07-16 DIAGNOSIS — E875 Hyperkalemia: Secondary | ICD-10-CM | POA: Insufficient documentation

## 2019-07-16 DIAGNOSIS — E1159 Type 2 diabetes mellitus with other circulatory complications: Secondary | ICD-10-CM

## 2019-07-16 DIAGNOSIS — D509 Iron deficiency anemia, unspecified: Secondary | ICD-10-CM | POA: Insufficient documentation

## 2019-07-16 DIAGNOSIS — D649 Anemia, unspecified: Secondary | ICD-10-CM

## 2019-07-16 DIAGNOSIS — E278 Other specified disorders of adrenal gland: Secondary | ICD-10-CM

## 2019-07-16 DIAGNOSIS — G3109 Other frontotemporal dementia: Secondary | ICD-10-CM

## 2019-07-16 DIAGNOSIS — Z79899 Other long term (current) drug therapy: Secondary | ICD-10-CM

## 2019-07-16 DIAGNOSIS — N2889 Other specified disorders of kidney and ureter: Secondary | ICD-10-CM

## 2019-07-16 DIAGNOSIS — F0280 Dementia in other diseases classified elsewhere without behavioral disturbance: Secondary | ICD-10-CM

## 2019-07-16 MED ORDER — FERROUS SULFATE 325 (65 FE) MG PO TBEC: 325.0000 mg | DELAYED_RELEASE_TABLET | Freq: Two times a day (BID) | ORAL | 11 refills | Status: DC

## 2019-07-16 MED ORDER — AMLODIPINE BESYLATE 5 MG PO TABS: ORAL_TABLET | ORAL | 0 refills | Status: DC

## 2019-07-16 MED ORDER — LISINOPRIL 10 MG PO TABS: 10.0000 mg | ORAL_TABLET | Freq: Every day | ORAL | 3 refills | Status: DC

## 2019-07-16 NOTE — Telephone Encounter (Signed)
Patients wife is aware of the below and verbalized understanding. Future lab orders placed per Dr. Raliegh Scarlet and call was transferred to front desk for scheduling. AS, CMA

## 2019-07-16 NOTE — Telephone Encounter (Signed)
-----   Message from Mellody Dance, DO sent at 07/16/2019  9:51 AM EST ----- Reviewed patient labs which again reveal anemia of per labs done 07/05/2018 by gastroenterology, revealed iron deficiency anemia.  I do not see that the patient has been started on iron supplements.  I do see that the patient is also on a PPI which will further reduce iron absorption.    -Please let patient's wife know that he should be on ferrous sulfate 325 mg twice daily and it would be best if he could drink a low sugar glass of orange juice along with the supplement to further enhance absorption.   Please tell them this can be purchased OTC or we can send in script if that is their preference. -Labs of CBC, as well as anemia panel should be repeated in 6 weeks, please place future orders and notify patient's wife to make lab only appointment for this.  -If we do not see improvement in the iron levels after this we will have to entertain patient going on parenteral iron such as Imferon in the future.   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   If the family happens to mention it, patient needs a low-dose CT chest scan for lung cancer screening as well as additional scans for evaluation of his adrenal nodule-however, I want to get the input of the radiologist prior to ordering a particular scan.   We will be in touch with them in the near future regarding these repeat imaging studies ))

## 2019-07-16 NOTE — Progress Notes (Signed)
  Spoke with patient and patient's wife about increased potassium levels as well on recent labs as well as his anemia and need to take Fe Supp regularly.     -pt was increased from 10mg  of lisinopril to 20 mg on Jan 21st at [facility] for persistent microalbuminuria as well as poorly controlled hypertension.  This is likely contributing to his increased potassium level now.   Patient denies any muscle tetany, cardiac symptoms or any new symptoms at all.  Again encouraged pt to perform Google search on foods high in potassium and avoid those.    --> Today, patient's wife was told to dec lisinopril dose back down to 10mg  and we will add on amlodipine 5mg  tab.    Pt will start off at 1/2 tab daily and check BP's daily and report to [place] in 2 wks what they are running.  Then will increase to 1 full tablet daily if BP still not at goal of less than 150/90 on a regular basis.  They will let us know if he has any side effects or problems with the medicine prior to that.  -Notified we will repeat BMP with GFR in [redacted] weeks along with the anemia labs. -Also notified we will check his magnesium level since he is on chronic metformin -Recheck valproic acid level as well since it was last done 8 months ago  -If potassium remains elevated upon repeat in 6 weeks after decreasing lisinopril back down to 10 and adding amlodipine, we will consider adding on either loop diuretic depending on the potassium level on repeat      ----->   Jul 22, 2019 OV note with PCP:   Has tolerated increase in ACE- continue Lisinopril 20mg  QD Continue to check blood pressure (BP) and heart rate (HR) several times per week.  Call clinic if BP consistently <110/60 or >150/90  Tresiba 10 U QD, Januiva 100mg  QD, Metformin 1000mg  BID Call clinic if fasting blood glucose ever <80 or consistently >150.  FOLLOW-UP:  Return in about 2 months (around 09/08/2019) for Regular Follow Up, HTN, Diabetes.     ---> Jun 20 2019 OV note with PCP:    Continue all medications as directed, with these two changes- Due to abnormal microalbumin- increased Lisinopril from 10mg  to 20mg  once daily. Please check blood pressure (BP) and heart rate daily- record. Please call clinic is BP is consistently >150/90 or <100/60. Reduce Tresiba from 14 U to 10 U- A1c is 6.9 and your Neurologist wants it > 7. Continue to check your fasting blood glucose and call clinic if it is consistently >150 or ever <80. Remain well hydrated, follow diabetic diet, remain as active as tolerated (right hip pain). Referral placed to Orthopedic Specialist to address right hip pain. Continue to social distance and wear a mask when in public. Complete your COVID-19 vaccine series 5 Feb. Follow-up here in 1 month, re: Blood Pressure. come fasting- labs: CMP/CBC/TSH/Vit D/Mg++/B12

## 2019-07-17 LAB — HM DIABETES EYE EXAM

## 2019-07-23 ENCOUNTER — Other Ambulatory Visit: Payer: Self-pay | Admitting: Adult Health

## 2019-07-24 NOTE — Telephone Encounter (Signed)
Dustin Ferrell unavailable to fill medication so routed to me at [facility] until another provider can be established with patient.

## 2019-08-11 ENCOUNTER — Other Ambulatory Visit: Payer: Self-pay | Admitting: Adult Health

## 2019-08-13 ENCOUNTER — Other Ambulatory Visit: Payer: Self-pay | Admitting: Adult Health

## 2019-08-13 DIAGNOSIS — E875 Hyperkalemia: Secondary | ICD-10-CM

## 2019-08-13 DIAGNOSIS — I152 Hypertension secondary to endocrine disorders: Secondary | ICD-10-CM

## 2019-08-13 DIAGNOSIS — E1159 Type 2 diabetes mellitus with other circulatory complications: Secondary | ICD-10-CM

## 2019-08-13 MED ORDER — DIVALPROEX SODIUM ER 500 MG PO TB24: 500.0000 mg | ORAL_TABLET | Freq: Two times a day (BID) | ORAL | 0 refills | Status: DC

## 2019-08-13 MED ORDER — AMLODIPINE BESYLATE 5 MG PO TABS: 5.0000 mg | ORAL_TABLET | Freq: Every day | ORAL | 0 refills | Status: AC

## 2019-08-13 MED ORDER — DIVALPROEX SODIUM ER 250 MG PO TB24: 250.0000 mg | ORAL_TABLET | Freq: Two times a day (BID) | ORAL | 0 refills | Status: DC

## 2019-08-13 MED ORDER — SITAGLIPTIN PHOSPHATE 100 MG PO TABS: ORAL_TABLET | ORAL | 0 refills | Status: DC

## 2019-08-13 NOTE — Telephone Encounter (Signed)
Previously prescribed by Neurologist.  Verified with pt's spouse and daughter that pt is taking the 250mg  in conjunction with the 500mg  BID.  Please review and refill if appropriate.  Charyl Bigger, CMA

## 2019-08-19 ENCOUNTER — Other Ambulatory Visit: Payer: Self-pay | Admitting: Gastroenterology

## 2019-08-28 ENCOUNTER — Other Ambulatory Visit: Payer: Medicare HMO

## 2019-08-28 ENCOUNTER — Other Ambulatory Visit: Payer: Self-pay

## 2019-08-28 DIAGNOSIS — E1159 Type 2 diabetes mellitus with other circulatory complications: Secondary | ICD-10-CM

## 2019-08-28 DIAGNOSIS — D649 Anemia, unspecified: Secondary | ICD-10-CM

## 2019-08-28 DIAGNOSIS — Z79899 Other long term (current) drug therapy: Secondary | ICD-10-CM

## 2019-08-28 DIAGNOSIS — E279 Disorder of adrenal gland, unspecified: Secondary | ICD-10-CM

## 2019-08-28 DIAGNOSIS — E278 Other specified disorders of adrenal gland: Secondary | ICD-10-CM

## 2019-08-28 DIAGNOSIS — E875 Hyperkalemia: Secondary | ICD-10-CM

## 2019-08-28 DIAGNOSIS — G3109 Other frontotemporal dementia: Secondary | ICD-10-CM

## 2019-08-28 DIAGNOSIS — F0280 Dementia in other diseases classified elsewhere without behavioral disturbance: Secondary | ICD-10-CM

## 2019-08-28 DIAGNOSIS — N2889 Other specified disorders of kidney and ureter: Secondary | ICD-10-CM

## 2019-08-29 LAB — IRON AND TIBC
Iron Saturation: 22 % (ref 15–55)
Iron: 84 ug/dL (ref 38–169)
Total Iron Binding Capacity: 387 ug/dL (ref 250–450)
UIBC: 303 ug/dL (ref 111–343)

## 2019-08-29 LAB — CBC WITH DIFFERENTIAL/PLATELET
Basophils Absolute: 0 10*3/uL (ref 0.0–0.2)
Basos: 1 %
EOS (ABSOLUTE): 0.1 10*3/uL (ref 0.0–0.4)
Eos: 3 %
Hematocrit: 38.6 % (ref 37.5–51.0)
Hemoglobin: 12.6 g/dL — ABNORMAL LOW (ref 13.0–17.7)
Immature Grans (Abs): 0 10*3/uL (ref 0.0–0.1)
Immature Granulocytes: 0 %
Lymphocytes Absolute: 1.1 10*3/uL (ref 0.7–3.1)
Lymphs: 21 %
MCH: 27.9 pg (ref 26.6–33.0)
MCHC: 32.6 g/dL (ref 31.5–35.7)
MCV: 86 fL (ref 79–97)
Monocytes Absolute: 0.5 10*3/uL (ref 0.1–0.9)
Monocytes: 9 %
Neutrophils Absolute: 3.4 10*3/uL (ref 1.4–7.0)
Neutrophils: 66 %
Platelets: 243 10*3/uL (ref 150–450)
RBC: 4.51 x10E6/uL (ref 4.14–5.80)
RDW: 22 % — ABNORMAL HIGH (ref 11.6–15.4)
WBC: 5.1 10*3/uL (ref 3.4–10.8)

## 2019-08-29 LAB — BASIC METABOLIC PANEL
BUN/Creatinine Ratio: 29 — ABNORMAL HIGH (ref 10–24)
BUN: 25 mg/dL (ref 8–27)
CO2: 20 mmol/L (ref 20–29)
Calcium: 9.5 mg/dL (ref 8.6–10.2)
Chloride: 103 mmol/L (ref 96–106)
Creatinine, Ser: 0.86 mg/dL (ref 0.76–1.27)
GFR calc Af Amer: 98 mL/min/{1.73_m2} (ref >59)
GFR calc non Af Amer: 85 mL/min/{1.73_m2} (ref >59)
Glucose: 119 mg/dL — ABNORMAL HIGH (ref 65–99)
Potassium: 4.9 mmol/L (ref 3.5–5.2)
Sodium: 139 mmol/L (ref 134–144)

## 2019-08-29 LAB — RETICULOCYTES: Retic Ct Pct: 1 % (ref 0.6–2.6)

## 2019-08-29 LAB — B12 AND FOLATE PANEL
Folate: 17.1 ng/mL (ref >3.0)
Vitamin B-12: 362 pg/mL (ref 232–1245)

## 2019-08-29 LAB — TRANSFERRIN: Transferrin: 302 mg/dL (ref 177–329)

## 2019-08-29 LAB — FERRITIN: Ferritin: 34 ng/mL (ref 30–400)

## 2019-08-29 LAB — VALPROIC ACID LEVEL: Valproic Acid Lvl: 68 ug/mL (ref 50–100)

## 2019-08-29 LAB — MAGNESIUM: Magnesium: 1.7 mg/dL (ref 1.6–2.3)

## 2019-09-10 ENCOUNTER — Other Ambulatory Visit: Payer: Self-pay | Admitting: Adult Health

## 2019-09-10 DIAGNOSIS — Z794 Long term (current) use of insulin: Secondary | ICD-10-CM

## 2019-09-10 DIAGNOSIS — E089 Diabetes mellitus due to underlying condition without complications: Secondary | ICD-10-CM

## 2019-12-20 ENCOUNTER — Other Ambulatory Visit (HOSPITAL_COMMUNITY): Payer: Self-pay | Admitting: Neurology

## 2019-12-20 ENCOUNTER — Other Ambulatory Visit: Payer: Self-pay | Admitting: Neurology

## 2019-12-20 DIAGNOSIS — Z87820 Personal history of traumatic brain injury: Secondary | ICD-10-CM

## 2019-12-20 DIAGNOSIS — G9389 Other specified disorders of brain: Secondary | ICD-10-CM

## 2020-01-07 ENCOUNTER — Ambulatory Visit
Admission: RE | Admit: 2020-01-07 | Discharge: 2020-01-07 | Disposition: A | Discharge status: Home / Self Care | Payer: Medicare HMO | Source: Ambulatory Visit | Attending: Neurology | Admitting: Neurology

## 2020-01-07 ENCOUNTER — Other Ambulatory Visit: Payer: Self-pay

## 2020-01-07 DIAGNOSIS — Z87820 Personal history of traumatic brain injury: Secondary | ICD-10-CM | POA: Insufficient documentation

## 2020-01-07 DIAGNOSIS — G9389 Other specified disorders of brain: Secondary | ICD-10-CM | POA: Diagnosis present

## 2020-01-07 MED ORDER — GADOBUTROL 1 MMOL/ML IV SOLN
7.5000 mL | Freq: Once | INTRAVENOUS | Status: AC | PRN
  Administered 2020-01-07: 7.5 mL via INTRAVENOUS

## 2020-01-13 ENCOUNTER — Other Ambulatory Visit: Payer: Self-pay | Admitting: Adult Health

## 2020-01-22 ENCOUNTER — Other Ambulatory Visit: Payer: Self-pay | Admitting: Internal Medicine

## 2020-01-22 DIAGNOSIS — E278 Other specified disorders of adrenal gland: Secondary | ICD-10-CM

## 2020-01-22 DIAGNOSIS — I951 Orthostatic hypotension: Secondary | ICD-10-CM

## 2020-02-11 ENCOUNTER — Other Ambulatory Visit: Payer: Self-pay | Admitting: Gastroenterology

## 2020-02-21 ENCOUNTER — Ambulatory Visit: Payer: Medicare HMO

## 2020-02-23 ENCOUNTER — Other Ambulatory Visit: Payer: Self-pay | Admitting: Adult Health

## 2020-02-24 ENCOUNTER — Ambulatory Visit
Admission: RE | Admit: 2020-02-24 | Discharge: 2020-02-24 | Disposition: A | Discharge status: Home / Self Care | Payer: Medicare HMO | Source: Ambulatory Visit | Attending: Internal Medicine | Admitting: Internal Medicine

## 2020-02-24 ENCOUNTER — Other Ambulatory Visit: Payer: Self-pay

## 2020-02-24 DIAGNOSIS — N200 Calculus of kidney: Secondary | ICD-10-CM | POA: Diagnosis not present

## 2020-02-24 DIAGNOSIS — I700 Atherosclerosis of aorta: Secondary | ICD-10-CM | POA: Diagnosis not present

## 2020-02-24 DIAGNOSIS — I951 Orthostatic hypotension: Secondary | ICD-10-CM | POA: Diagnosis present

## 2020-02-24 DIAGNOSIS — E278 Other specified disorders of adrenal gland: Secondary | ICD-10-CM | POA: Insufficient documentation

## 2020-02-24 LAB — POCT I-STAT CREATININE: Creatinine, Ser: 0.9 mg/dL (ref 0.61–1.24)

## 2020-02-24 MED ORDER — IOHEXOL 300 MG/ML  SOLN
100.0000 mL | Freq: Once | INTRAMUSCULAR | Status: AC | PRN
  Administered 2020-02-24: 100 mL via INTRAVENOUS

## 2020-03-23 ENCOUNTER — Other Ambulatory Visit: Payer: Self-pay | Admitting: Adult Health

## 2020-03-23 DIAGNOSIS — E559 Vitamin D deficiency, unspecified: Secondary | ICD-10-CM

## 2020-05-31 ENCOUNTER — Other Ambulatory Visit: Payer: Self-pay | Admitting: Adult Health

## 2020-06-19 ENCOUNTER — Telehealth: Payer: Self-pay | Admitting: *Deleted

## 2020-06-19 NOTE — Telephone Encounter (Signed)
Received referral for low dose lung cancer screening CT scan. Message left at phone number listed in EMR for patient to call me back to facilitate scheduling scan.  

## 2020-07-10 ENCOUNTER — Telehealth: Payer: Self-pay | Admitting: *Deleted

## 2020-07-10 NOTE — Telephone Encounter (Signed)
Received referral for low dose lung cancer screening CT scan. Message left at phone number listed in EMR for patient to call me back to facilitate scheduling scan.  

## 2020-07-15 ENCOUNTER — Telehealth: Payer: Self-pay | Admitting: *Deleted

## 2020-07-15 DIAGNOSIS — Z87891 Personal history of nicotine dependence: Secondary | ICD-10-CM

## 2020-07-15 DIAGNOSIS — F17200 Nicotine dependence, unspecified, uncomplicated: Secondary | ICD-10-CM

## 2020-07-15 DIAGNOSIS — Z122 Encounter for screening for malignant neoplasm of respiratory organs: Secondary | ICD-10-CM

## 2020-07-15 NOTE — Telephone Encounter (Signed)
Received referral for initial lung cancer screening scan. Contacted patient and obtained smoking history,(current 60 pack year) as well as answering questions related to screening process. Patient denies signs of lung cancer such as weight loss or hemoptysis. Patient denies comorbidity that would prevent curative treatment if lung cancer were found. Patient is scheduled for shared decision making visit and CT scan on 07/29/20 1030am.

## 2020-07-29 ENCOUNTER — Encounter: Payer: Self-pay | Admitting: Oncology

## 2020-07-29 ENCOUNTER — Ambulatory Visit
Admission: RE | Admit: 2020-07-29 | Discharge: 2020-07-29 | Disposition: A | Discharge status: Home / Self Care | Payer: Medicare HMO | Source: Ambulatory Visit | Attending: Oncology | Admitting: Oncology

## 2020-07-29 ENCOUNTER — Inpatient Hospital Stay: Payer: Medicare HMO | Attending: Oncology | Admitting: Oncology

## 2020-07-29 ENCOUNTER — Other Ambulatory Visit: Payer: Self-pay

## 2020-07-29 DIAGNOSIS — Z87891 Personal history of nicotine dependence: Secondary | ICD-10-CM

## 2020-07-29 DIAGNOSIS — F17200 Nicotine dependence, unspecified, uncomplicated: Secondary | ICD-10-CM | POA: Diagnosis present

## 2020-07-29 DIAGNOSIS — Z122 Encounter for screening for malignant neoplasm of respiratory organs: Secondary | ICD-10-CM | POA: Diagnosis present

## 2020-07-29 NOTE — Progress Notes (Signed)
Virtual Visit via Video Note  I connected with Dustin Ferrell on 07/29/20 at 10:30 AM EST by a video enabled telemedicine application and verified that I am speaking with the correct person using two identifiers.  Location: Patient: [facility] Provider: Clinic   I discussed the limitations of evaluation and management by telemedicine and the availability of in person appointments. The patient expressed understanding and agreed to proceed.  I discussed the assessment and treatment plan with the patient. The patient was provided an opportunity to ask questions and all were answered. The patient agreed with the plan and demonstrated an understanding of the instructions.   The patient was advised to call back or seek an in-person evaluation if the symptoms worsen or if the condition fails to improve as anticipated.   In accordance with CMS guidelines, patient has met eligibility criteria including age, absence of signs or symptoms of lung cancer.  Social History   Tobacco Use  . Smoking status: Current Every Day Smoker    Packs/day: 1.00    Years: 60.00    Pack years: 60.00    Types: Cigarettes  . Smokeless tobacco: Never Used  Vaping Use  . Vaping Use: Every day  . Substances: Nicotine  Substance Use Topics  . Alcohol use: Yes    Alcohol/week: 18.0 standard drinks    Types: 18 Glasses of wine per week    Comment: Has a few drinks in the evening 2-3 beers  . Drug use: No      A shared decision-making session was conducted prior to the performance of CT scan. This includes one or more decision aids, includes benefits and harms of screening, follow-up diagnostic testing, over-diagnosis, false positive rate, and total radiation exposure.   Counseling on the importance of adherence to annual lung cancer LDCT screening, impact of co-morbidities, and ability or willingness to undergo diagnosis and treatment is imperative for compliance of the program.   Counseling on the importance of continued  smoking cessation for former smokers; the importance of smoking cessation for current smokers, and information about tobacco cessation interventions have been given to patient including [place] and 1800 quit [place] programs.   Written order for lung cancer screening with LDCT has been given to the patient and any and all questions have been answered to the best of my abilities.    Yearly follow up will be coordinated by Burgess Estelle, Thoracic Navigator.  I provided 15 minutes of face-to-face video visit time during this encounter, and > 50% was spent counseling as documented under my assessment & plan.   Jacquelin Hawking, NP

## 2020-08-06 ENCOUNTER — Encounter: Payer: Self-pay | Admitting: *Deleted

## 2020-11-23 IMAGING — CT CT ABDOMEN WO/W CM
3 of 11 series · 12 of 46 positions shown, 18 images · IV contrast (APPLIED)
Comparison: 09/05/2017

CLINICAL DATA: Follow-up adrenal mass

EXAM:
CT ABDOMEN WITHOUT AND WITH CONTRAST
TECHNIQUE: Multidetector CT imaging of the abdomen was performed following the
standard protocol before and following the bolus administration of
intravenous contrast.
CONTRAST:  100mL OMNIPAQUE IOHEXOL 300 MG/ML SOLN, additional oral
enteric contrast

[Series 5: coronal wo · coronal · 0.57mm/px · 2 of 150 slices shown, 3 images]
[im 50/150  soft-tissue]
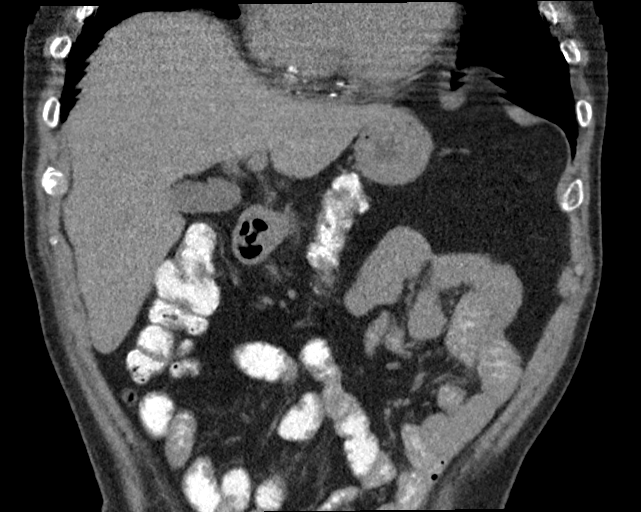
[im 50/150  bone]
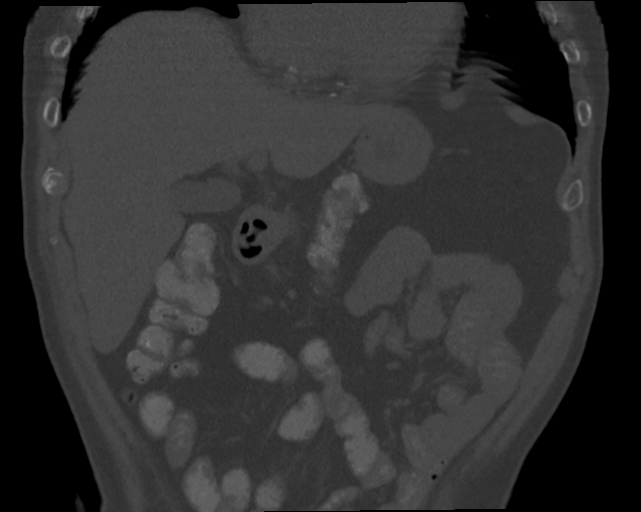
[im 100/150  soft-tissue]
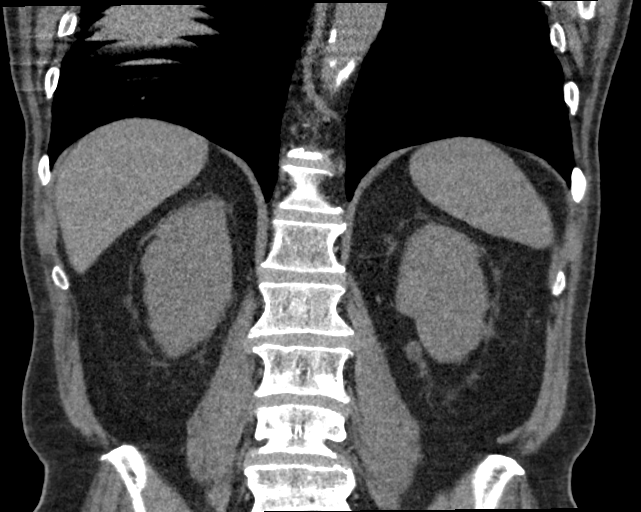

[Series 8: axial st · axial · 0.74mm/px · z∈[-330,-108]mm · 7 of 100 slices shown, 12 images (1 of 2)]
[im 13/100  soft-tissue]
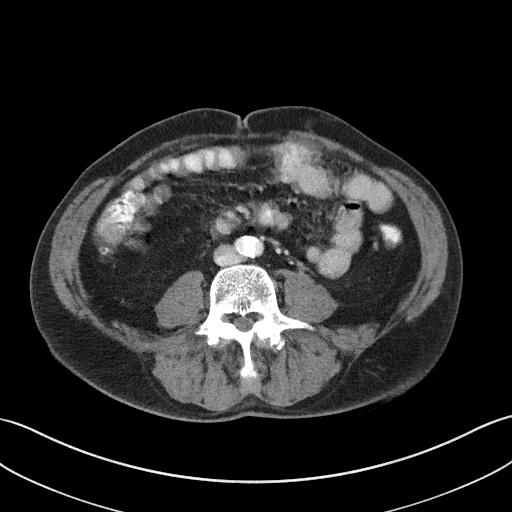
[im 13/100  bone]
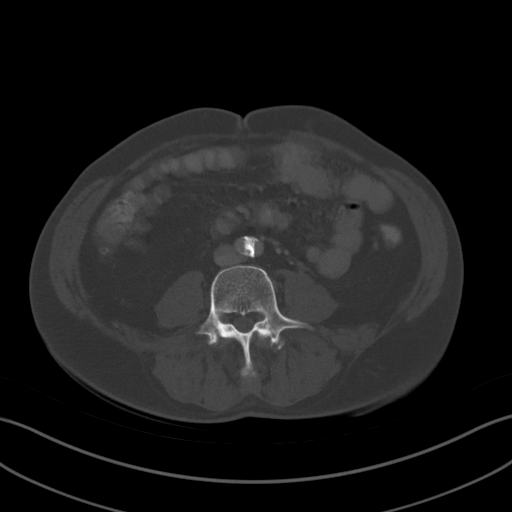
[im 25/100  soft-tissue]
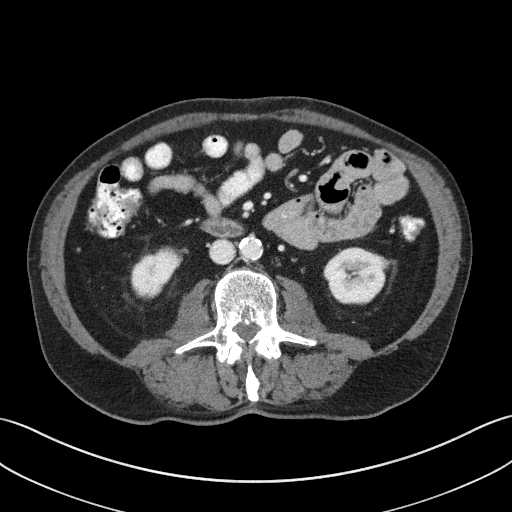
[im 38/100  soft-tissue]
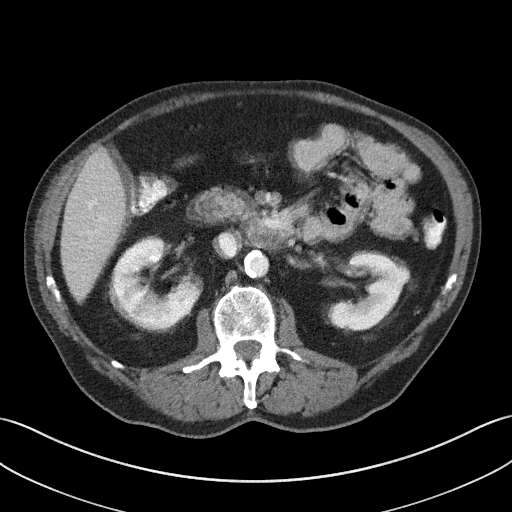
[im 50/100  soft-tissue]
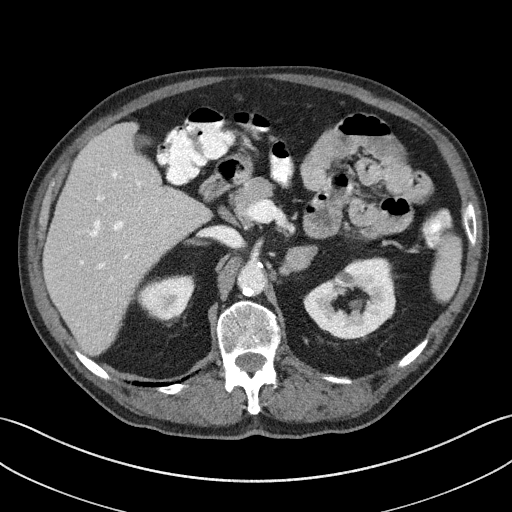
[im 50/100  lung]
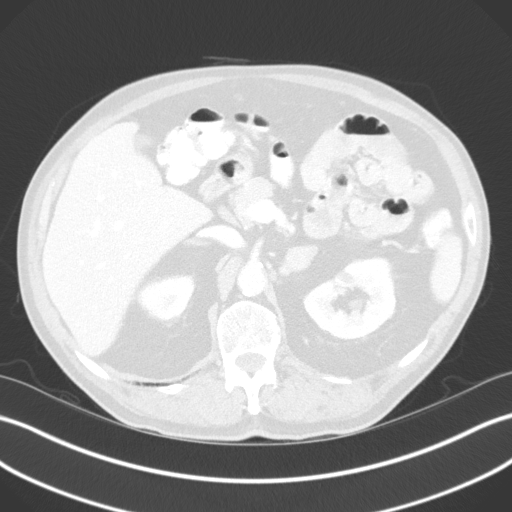
[im 62/100  soft-tissue]
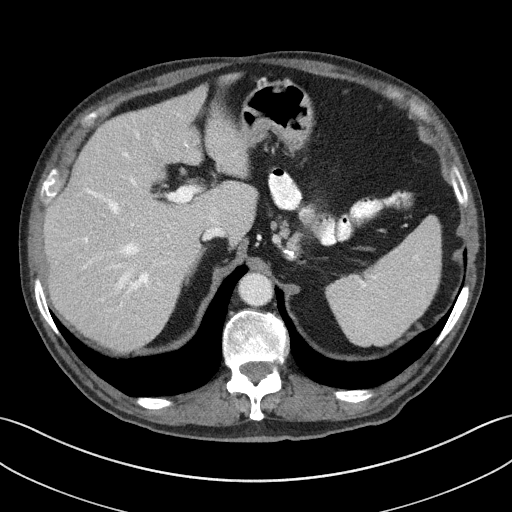
[im 62/100  lung]
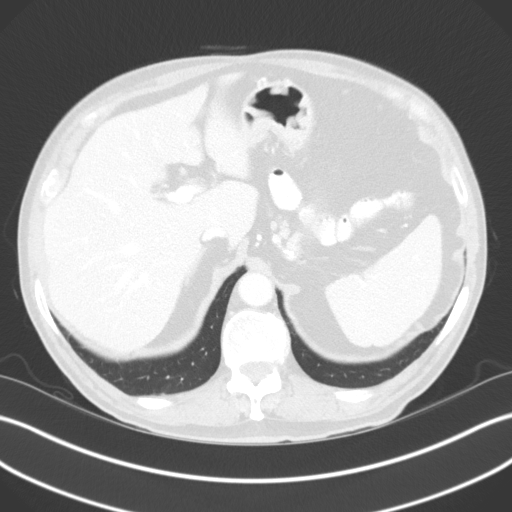
[im 75/100  soft-tissue]
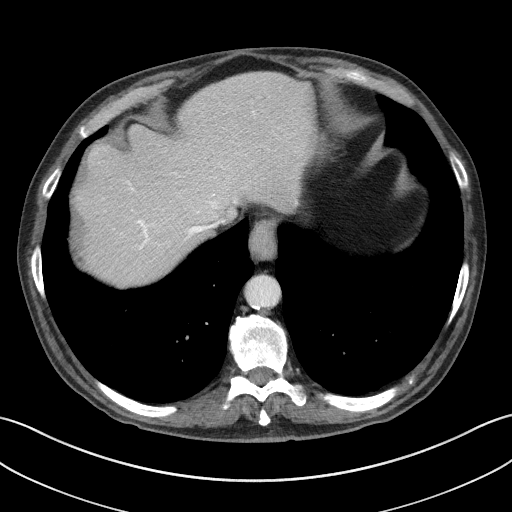
[im 75/100  lung]
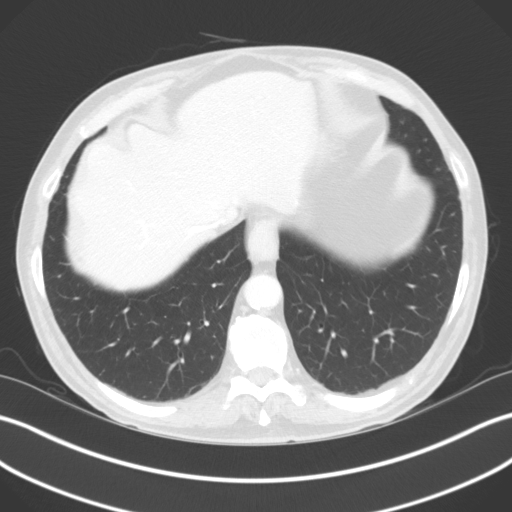
[im 87/100  soft-tissue]
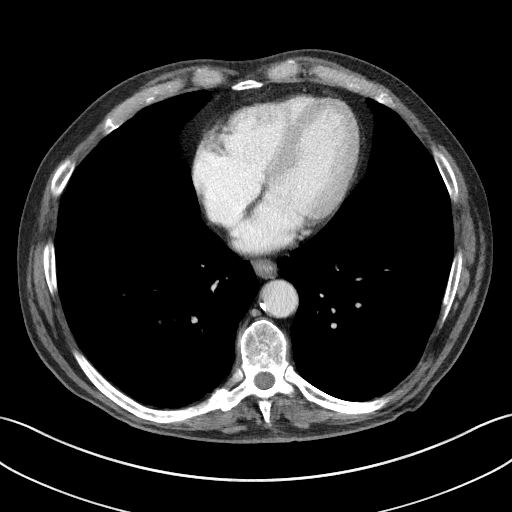
[im 87/100  lung]
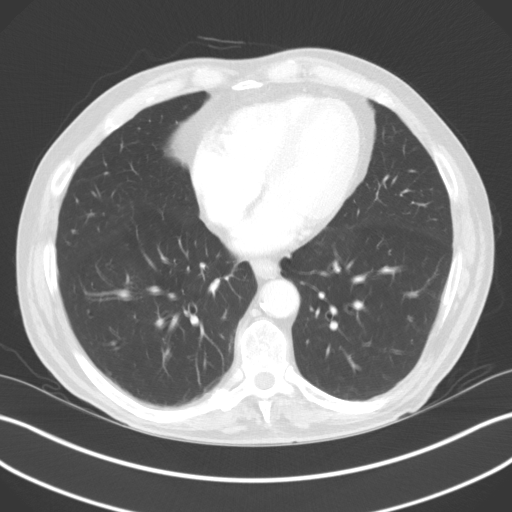

[Series 12: axial st · axial · 0.74mm/px · z∈[-332,-257]mm · 3 of 100 slices shown (2 of 2)]
[im 13/100  soft-tissue]
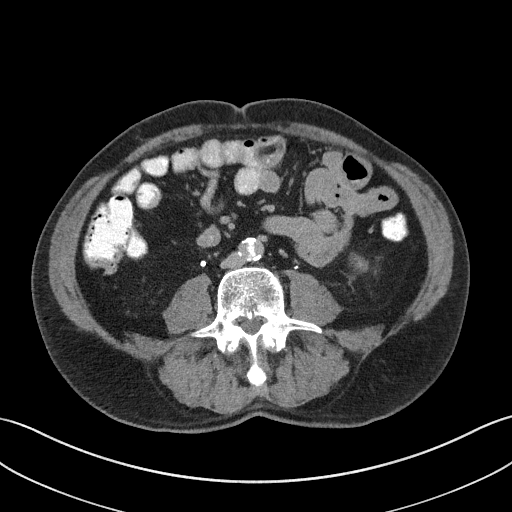
[im 25/100  soft-tissue]
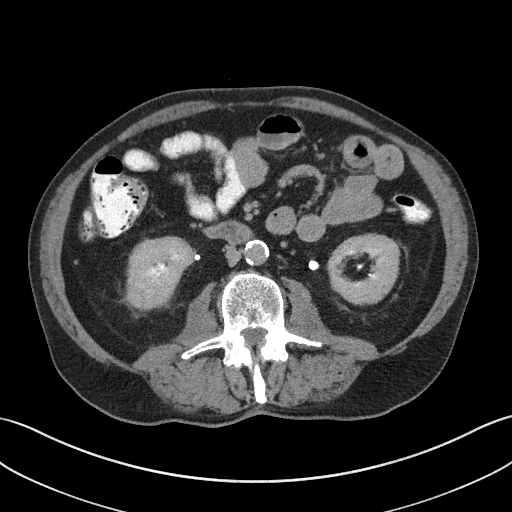
[im 38/100  soft-tissue]
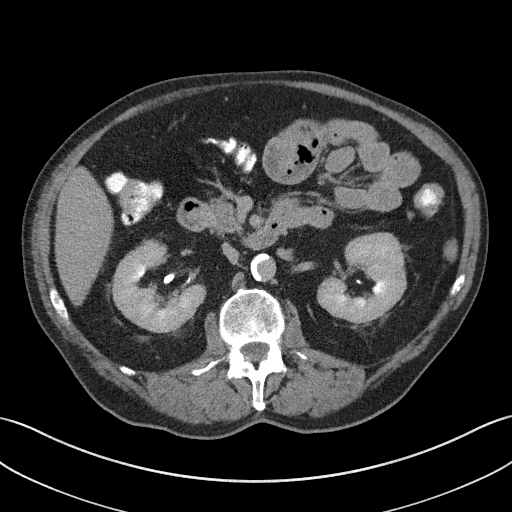

[12 of 46 positions shown; findings below may reference images not displayed]

FINDINGS: Lower chest: No acute abnormality.

Hepatobiliary: No focal liver abnormality is seen. No gallstones,
gallbladder wall thickening, or biliary dilatation.

Pancreas: Unremarkable. No pancreatic ductal dilatation or
surrounding inflammatory changes.

Spleen: Normal in size without focal abnormality.

Adrenals/Urinary Tract: No significant interval change in a soft
tissue attenuation nodule of the left adrenal gland measuring 2.7 x
2.3 cm (series 2, image 48). Absolute washout 64%. Relative washout
34%. Small nonobstructive inferior pole calculi of the right kidney.
No hydronephrosis.

Stomach/Bowel: Stomach is within normal limits. Appendix appears
normal. No evidence of bowel wall thickening, distention, or
inflammatory changes.

Vascular/Lymphatic: Aortic atherosclerosis. No enlarged abdominal
lymph nodes.

Other: No abdominal wall hernia or abnormality.

Musculoskeletal: No acute or significant osseous findings.
IMPRESSION: 1. No significant interval change in a soft tissue attenuation
nodule of the left adrenal gland measuring 2.7 x 2.3 cm. Absolute
washout 64%. Relative washout 34%. Stability and contrast
enhancement findings are consistent with a definitively benign
adenoma. No further follow-up is required.
2. Nonobstructive right nephrolithiasis.
3. Aortic Atherosclerosis ([id number]).

## 2021-02-18 ENCOUNTER — Ambulatory Visit: Payer: Medicare HMO | Attending: Internal Medicine

## 2021-02-18 ENCOUNTER — Other Ambulatory Visit: Payer: Self-pay

## 2021-02-18 DIAGNOSIS — M6281 Muscle weakness (generalized): Secondary | ICD-10-CM | POA: Insufficient documentation

## 2021-02-18 DIAGNOSIS — R2689 Other abnormalities of gait and mobility: Secondary | ICD-10-CM | POA: Diagnosis present

## 2021-02-18 DIAGNOSIS — R262 Difficulty in walking, not elsewhere classified: Secondary | ICD-10-CM | POA: Diagnosis present

## 2021-02-18 DIAGNOSIS — R278 Other lack of coordination: Secondary | ICD-10-CM | POA: Diagnosis present

## 2021-02-18 DIAGNOSIS — R2681 Unsteadiness on feet: Secondary | ICD-10-CM | POA: Insufficient documentation

## 2021-02-18 DIAGNOSIS — R269 Unspecified abnormalities of gait and mobility: Secondary | ICD-10-CM | POA: Diagnosis not present

## 2021-02-18 NOTE — Therapy (Signed)
[place] MAIN [facility] SERVICES [address] [place], [place], [address] Phone: [phone]   Fax:  [phone]  Physical Therapy Evaluation  Patient Details  Name: Dustin Ferrell MRN: [id number] Date of Birth: February 26, 1946 Referring Provider (PT): Dr. Joselyn Arrow  Encounter Date: 02/18/2021   PT End of Session - 02/18/21 1449     Visit Number 1    Number of Visits 25    Date for PT Re-Evaluation 05/13/21    Authorization Time Period Initial Certification = 9/32/6712= 05/13/2021    PT Start Time 1300    PT Stop Time 1404    PT Time Calculation (min) 64 min    Equipment Utilized During Treatment Gait belt    Activity Tolerance Patient tolerated treatment well    Behavior During Therapy WFL for tasks assessed/performed             Past Medical History:  Diagnosis Date   Anxiety    associated with frontal temporal behavioral dementia   Dementia with behavioral disturbance ([place])    Diabetes mellitus without complication ([place])    History of traumatic head injury    Hypertension     Past Surgical History:  Procedure Laterality Date   Eye Socket fracture repair Right    HERNIA REPAIR     ROTATOR CUFF REPAIR Left    TONSILLECTOMY     WRIST FRACTURE SURGERY Right     There were no vitals filed for this visit.     OBJECTIVE  MUSCULOSKELETAL: Tremor: Absent Bulk: Normal Tone: Normal, no clonus  Posture No gross abnormalities noted in standing or seated posture  Gait Slight widened base of support without AD, gait  deviation to right at times. Decreased gait speed=0.89 m/s   Strength R/L 4/4 Hip flexion 4/4 Hip external rotation 4/4 Hip internal rotation 4/4  Hip extension  4/4 Hip abduction 4/4 Hip adduction 4/4 Knee extension 4/4  Knee flexion 4+/4+ Ankle Plantarflexion 4+/4+ Ankle Dorsiflexion   NEUROLOGICAL:  Mental Status Patient is oriented to person, place and time.  Recent memory is intact.  Remote  memory is intact.  Attention span and concentration are intact.  Expressive speech is intact.  Patient's fund of knowledge is within normal limits for educational level.   Sensation Grossly intact to light touch bilateral UEs/LEs as determined by testing dermatomes C2-T2/L2-S2 respectively Proprioception and hot/cold testing deferred on this date   FUNCTIONAL OUTCOME MEASURES   Results Comments          FGA 17/30 Fall risk, in need of intervention  TUG  13 seconds No AD  5TSTS  15 seconds With No UE support      10 Meter Gait Speed Self-selected: 11.25s = 0.89 m/s Below normative values for full community ambulation  FOTO 55%            ASSESSMENT Clinical Impression: Pt is a pleasant 75 year-old male referred by Dr. Joselyn Arrow for difficulty with balance. Patient reports his main problem is difficulty with balance with 4 recent falls while in [place].   PT examination today reveals deficits including B LE muscle weakness and imbalance. Pt will benefit from skilled PT services to address deficits in balance and decrease risk for future falls.          [facility] PT Assessment - 02/18/21 1316       Assessment   Medical Diagnosis imbalance    Referring Provider (PT) Dr. Joselyn Arrow    Onset  Date/Surgical Date 11/27/20    Next MD Visit 9/27- Dr. Caryl Comes    Prior Therapy none      Restrictions   Weight Bearing Restrictions No      Balance Screen   Has the patient fallen in the past 6 months Yes    How many times? 6   Reported over past year- not dizzy- just lose my balance.   Has the patient had a decrease in activity level because of a fear of falling?  No    Is the patient reluctant to leave their home because of a fear of falling?  No      Home Ecologist residence    Living Arrangements Spouse/significant other    Available Help at Discharge Family;Friend(s)    Type of [place] to enter    Entrance Stairs-Number  of Steps 1    [place] One level    [place] None      Prior Function   Level of Independence Independent    Vocation Retired    Leisure Enjoys  outdoorsEngineer, water, gardening, yard work      Associate Professor   Overall Cognitive Status History of cognitive impairments - at baseline    Attention Focused    Focused Attention Appears intact    Memory Appears intact    Awareness Appears intact    Problem Solving Impaired    Problem Solving Impairment Functional complex      Observation/Other Assessments   Skin Integrity No issues with skin integrity    Focus on Therapeutic Outcomes (FOTO)  55      Standardized Balance Assessment   Standardized Balance Assessment Timed Up and Go Test;Five Times Sit to Stand;10 meter walk test    Five times sit to stand comments  15 sec without UE Support   Impulsive with initial standing- Enforced performing task with more control.   10 Meter Walk 11.25 sec= 0.89 m/s   no device     Timed Up and Go Test   TUG Normal TUG    Normal TUG (seconds) 13      Functional Gait  Assessment   Gait assessed  Yes    Gait Level Surface Walks 20 ft in less than 5.5 sec, no assistive devices, good speed, no evidence for imbalance, normal gait pattern, deviates no more than 6 in outside of the 12 in walkway width.    Change in Gait Speed Makes only minor adjustments to walking speed, or accomplishes a change in speed with significant gait deviations, deviates 10-15 in outside the 12 in walkway width, or changes speed but loses balance but is able to recover and continue walking.    Gait with Horizontal Head Turns Performs head turns with moderate changes in gait velocity, slows down, deviates 10-15 in outside 12 in walkway width but recovers, can continue to walk.    Gait with Vertical Head Turns Performs task with moderate change in gait velocity, slows down, deviates 10-15 in outside 12 in walkway width but recovers, can continue to  walk.    Gait and Pivot Turn Pivot turns safely within 3 sec and stops quickly with no loss of balance.    Step Over Obstacle Is able to step over 2 stacked shoe boxes taped together (9 in total height) without changing gait speed. No evidence of imbalance.    Gait with Narrow Base of Support  Ambulates less than 4 steps heel to toe or cannot perform without assistance.    Gait with Eyes Closed Walks 20 ft, slow speed, abnormal gait pattern, evidence for imbalance, deviates 10-15 in outside 12 in walkway width. Requires more than 9 sec to ambulate 20 ft.    Ambulating Backwards Walks 20 ft, uses assistive device, slower speed, mild gait deviations, deviates 6-10 in outside 12 in walkway width.    Steps Alternating feet, must use rail.    Total Score 17                        Objective measurements completed on examination: See above findings.                PT Education - 02/18/21 1441     Education Details PT Plan of care including LE strength training and gait/balance training.    Person(s) Educated Patient;Spouse    Methods Explanation;Demonstration    Comprehension Verbalized understanding;Need further instruction              PT Short Term Goals - 02/18/21 1454       PT SHORT TERM GOAL #1   Title Pt will be independent with HEP in order to improve strength and balance in order to decrease fall risk and improve function at home and work.    Time 6    Period Weeks    Status New    Target Date 04/01/21      PT SHORT TERM GOAL #4   Title --    Baseline --    Time --    Period --    Status --    Target Date --               PT Long Term Goals - 02/18/21 1458       PT LONG TERM GOAL #1   Title Pt will improve FOTO to target score of 60 display perceived improvements in ability to complete ADL's.    Baseline 02/18/2021= 55%    Time 12    Period Weeks    Status New    Target Date 05/13/21      PT LONG TERM GOAL #2   Title Pt will  decrease 5TSTS by at least 3 seconds in order to demonstrate clinically significant improvement in LE strength.    Baseline 02/18/2021= 15 sec    Time 12    Period Weeks    Status New      PT LONG TERM GOAL #3   Title Patient will increase Functional Gait Assessment score to >22/30 as to reduce fall risk and improve dynamic gait safety with community ambulation.    Baseline 02/18/2021= 17/30    Time 12    Period Weeks    Status New    Target Date 05/13/21      PT LONG TERM GOAL #4   Title Pt will increase 10MWT by at least 0.13 m/s in order to demonstrate clinically significant improvement in community ambulation.    Baseline 02/18/2021= 0.89 m/s without AD    Time 12    Period Weeks    Status New    Target Date 05/13/21                    Plan - 02/18/21 1450     Clinical Impression Statement Pt is a pleasant  75 year-old male referred by Dr. Joselyn Arrow for difficulty with balance.  Patient reports his main problem is difficulty with balance with 4 recent falls while in [place].   PT examination today reveals deficits including B LE muscle weakness and imbalance. Pt will benefit from skilled PT services to address deficits in balance and decrease risk for future falls.    Personal Factors and Comorbidities Age;Comorbidity 3+    Comorbidities TBI, Dementia, DM    Examination-Activity Limitations Carry;Other   walking   Examination-Participation Restrictions Community Activity;Yard Work    Stability/Clinical Decision Making Stable/Uncomplicated    Rehab Potential Good    PT Frequency 2x / week    PT Duration 12 weeks    PT Treatment/Interventions ADLs/Self Care Home Management;Cryotherapy;Moist Heat;DME Instruction;Gait training;Stair training;Functional mobility training;Therapeutic activities;Therapeutic exercise;Balance training;Neuromuscular re-education;Patient/family education;Manual techniques;Passive range of motion    PT Next Visit Plan Initiate balance activities  for improved static and dynamic balance    PT Home Exercise Plan To be initiated nex 1-2 sessions as appropriate.    Consulted and Agree with Plan of Care Patient;Family member/caregiver   spouse- Mickel Baas            Patient will benefit from skilled therapeutic intervention in order to improve the following deficits and impairments:  Abnormal gait, Decreased activity tolerance, Decreased balance, Decreased coordination, Decreased cognition, Decreased mobility, Decreased safety awareness, Decreased strength, Impaired perceived functional ability  Visit Diagnosis: Abnormality of gait and mobility  Difficulty in walking, not elsewhere classified  Muscle weakness (generalized)  Other lack of coordination     Problem List Patient Active Problem List   Diagnosis Date Noted   Iron deficiency anemia 07/16/2019   Adrenal nodule ([place])- left 07/16/2019   Serum potassium elevated 07/16/2019   High risk medications (not anticoagulants) long-term use 07/16/2019   Elevated LDL cholesterol level 06/25/2018   Healthcare maintenance 12/13/2017   Encounter for screening for malignant neoplasm of respiratory organs 07/12/2017   Overgrown toenails 07/12/2017   Vitamin D insufficiency 04/28/2017   Closed fracture of one rib of left side 04/28/2017   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 ([place]) 04/28/2017   Diabetes mellitus due to underlying condition without complication, with long-term current use of insulin ([place]) 04/06/2017   Elbow swelling, left 04/06/2017   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[place]) 04/06/2017   Fatigue 04/06/2017   Hypertension associated with diabetes ([place]) 04/06/2017   Screening for AAA (abdominal aortic aneurysm) 04/06/2017   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 [place], [place], [address] Phone: [phone]   Fax:  [phone]  Name: Dustin Ferrell MRN: [id number] Date of Birth: 12/26/1945

## 2021-02-22 ENCOUNTER — Other Ambulatory Visit: Payer: Self-pay

## 2021-02-22 ENCOUNTER — Ambulatory Visit: Payer: Medicare HMO

## 2021-02-22 DIAGNOSIS — R269 Unspecified abnormalities of gait and mobility: Secondary | ICD-10-CM

## 2021-02-22 DIAGNOSIS — R278 Other lack of coordination: Secondary | ICD-10-CM

## 2021-02-22 DIAGNOSIS — M6281 Muscle weakness (generalized): Secondary | ICD-10-CM

## 2021-02-22 DIAGNOSIS — R262 Difficulty in walking, not elsewhere classified: Secondary | ICD-10-CM

## 2021-02-22 NOTE — Therapy (Signed)
[place] [facility] SERVICES [address] [place], [place], [address] Phone: [phone]   Fax:  [phone]  Physical Therapy Treatment  Patient Details  Name: Dustin Ferrell MRN: [id number] Date of Birth: August 07, 1945 Referring Provider (PT): Dr. Joselyn Arrow   Encounter Date: 02/22/2021   PT End of Session - 02/22/21 1005     Visit Number 2    Number of Visits 25    Date for PT Re-Evaluation 05/13/21    Authorization Time Period Initial Certification = 3/81/8299= 05/13/2021    PT Start Time 1000    PT Stop Time 1045    PT Time Calculation (min) 45 min    Equipment Utilized During Treatment Gait belt    Activity Tolerance Patient tolerated treatment well    Behavior During Therapy WFL for tasks assessed/performed             Past Medical History:  Diagnosis Date   Anxiety    associated with frontal temporal behavioral dementia   Dementia with behavioral disturbance ([place])    Diabetes mellitus without complication ([place])    History of traumatic head injury    Hypertension     Past Surgical History:  Procedure Laterality Date   Eye Socket fracture repair Right    HERNIA REPAIR     ROTATOR CUFF REPAIR Left    TONSILLECTOMY     WRIST FRACTURE SURGERY Right     There were no vitals filed for this visit.   Subjective Assessment - 02/22/21 2035     Subjective Patient denies any falls and states ready to begin his PT today.    Pertinent History Per note submitted by Dr. Joselyn Arrow on 01/04/2021:1. 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  - Agree with wife, patient should not be unaccompanied to due concern for fall's risk   - Home health as below     - On valproic acid/depakote for behavior as well    2. History of simple partial seizure - cluster of seizures in 2017, no seizures since 2017    - Continue Depakote 1000 mg  tablet by mouth two times a day (twice daily)   - Trazodone discontinued   - Reviewed Depakote (7.4), CMP   - Will stay on Depakote for behavioral symptoms     3. Alcohol use disorder- still drinks 2-3 beers a day, sometimes 7-8 beers a day  - Discontinued alcohol use (July 2022). Encouraged efforts to continue to avoid alcohol     4. Imbalance - multifactorial (alcohol, TBI, diabetic neuropathy), now with multiple falls   - Home health referral for Physical therapy, occupational therapy, home health aide, respite care (as wife is primary caretaker, and patient should not be left unaccompanied)     - Agree with wife, patient should have electric wheelchair/assistive device to assist with mobility, especially while traveling   - Discussed fall's risk   There are a few interventions the patient should consider to prevent falls: Be careful, Avoid high heel footwear, Use appropriate assistive device, Avoid hazards in your path, (toys, pets, wires, etc.), Have good light when you walk, Use a nonskid shower mat/shower chair, Use handrails and grab bars. Check out www.stopfalls.org for more information.    5. Obstructive sleep apnea   - Home sleep study - not completed, patient deferred due to stated noncompliance/per preference   -.  6. Type 2 diabetes mellitus- on insulin, per primary care provider     7. Tobacco use-1 ppd in the past, attempting   - Current every day smoker   - Encouraged smoking cessation     8. High ASCVD score - represents an increased risk of heart attack or stroke. Please continue to work on aggressive vascular risk factors reduction e.g. blood pressure control, no smoking, blood sugar, and cholesterol management. Consider talking to your primary care physician if you are not on antiplatelet and statin medication.    Limitations Standing;Walking;House hold activities    How long can you sit comfortably? no restriction    How long can you stand comfortably? 15 min    How long can you walk  comfortably? 10 min    Patient Stated Goals I want to improve my balance and not fall    Currently in Pain? No/denies              INTERVENTIONS:  Neuromuscular re-education:   Patient instructed in static standing in varying feet position, Eyes either open or closed, and head in neutral or turning/nodding.   1) feet apart- EO x 20 sec 2) feet apart - EC x 20 sec- Mild increase sway 3) feet apart - EO  with head turn then nod x 5 each 4) feet apart - EC with head turn then nod x 5 each 5) feet narrowed - EO x 20 sec 6) feet narrowed - EC x 20 sec- Mild increase unsteadiness 7) feet narrowed - EO with head turn then nod x 5. 8) feet narrowed - EC with head turn then nod x 5. 9) Feet Staggered - EO x 20 sec x 5 trials 10) Feet staggered - EC x 20 sec x 5 trials- Unsteady with increased LOB requiring close CGA and UE support intermittently.  11) Tandem stance with EC holding 4-8 sec x 5 trials each LE- increased unsteadiness.  12) Single leg stance - Hold 3-5 sec x 8 trials each LE. - Increased overall difficulty requiring light UE to maintain balance.   Education provided throughout session via VC/TC and demonstration to facilitate movement at target joints and correct muscle activation for all exercises performed.    Access Code: [email or web address] URL: [email or web address] Date: 02/22/2021 Prepared by: Sande Brothers  Exercises Half Tandem Stance Balance with Eyes Closed - 1 x daily - 3 x weekly - 5 sets - up to 20 hold Romberg Stance with Eyes Closed - 1 x daily - 3 x weekly - 5 sets - up to 20 hold Tandem Stance - 1 x daily - 3 x weekly - 5 sets - up to 20 hold Single Leg Stance - 1 x daily - 3 x weekly - 5 sets - up to 20 sec hold   Clinical Impression: Patient responded well to adding balance exercises to home program- Able to follow all VC, TC and visual demo and return demo correctly and with no significant issues other than LOB with eyes closed and  staggered positions. He was able to follow handout and his wife was present and to work with him until her returns later this week. Patient will benefit from continued skilled PT services to assist with LE strengthening, dynamic balance and improved mobility to decrease risk of falling and improve quality of life.                      PT Education - 02/22/21 1004  Education Details Exercise technique    Person(s) Educated Patient    Methods Explanation;Demonstration;Tactile cues;Verbal cues    Comprehension Tactile cues required;Verbalized understanding;Returned demonstration;Need further instruction;Verbal cues required              PT Short Term Goals - 02/18/21 1454       PT SHORT TERM GOAL #1   Title Pt will be independent with HEP in order to improve strength and balance in order to decrease fall risk and improve function at home and work.    Time 6    Period Weeks    Status New    Target Date 04/01/21      PT SHORT TERM GOAL #4   Title --    Baseline --    Time --    Period --    Status --    Target Date --               PT Long Term Goals - 02/18/21 1458       PT LONG TERM GOAL #1   Title Pt will improve FOTO to target score of 60 display perceived improvements in ability to complete ADL's.    Baseline 02/18/2021= 55%    Time 12    Period Weeks    Status New    Target Date 05/13/21      PT LONG TERM GOAL #2   Title Pt will decrease 5TSTS by at least 3 seconds in order to demonstrate clinically significant improvement in LE strength.    Baseline 02/18/2021= 15 sec    Time 12    Period Weeks    Status New      PT LONG TERM GOAL #3   Title Patient will increase Functional Gait Assessment score to >22/30 as to reduce fall risk and improve dynamic gait safety with community ambulation.    Baseline 02/18/2021= 17/30    Time 12    Period Weeks    Status New    Target Date 05/13/21      PT LONG TERM GOAL #4   Title Pt will increase  10MWT by at least 0.13 m/s in order to demonstrate clinically significant improvement in community ambulation.    Baseline 02/18/2021= 0.89 m/s without AD    Time 12    Period Weeks    Status New    Target Date 05/13/21                   Plan - 02/22/21 1005     Clinical Impression Statement Patient responded well to adding balance exercises to home program- Able to follow all VC, TC and visual demo and return demo correctly and with no significant issues other than LOB with eyes closed and staggered positions. He was able to follow handout and his wife was present and to work with him until her returns later this week. Patient will benefit from continued skilled PT services to assist with LE strengthening, dynamic balance and improved mobility to decrease risk of falling and improve quality of life.    Personal Factors and Comorbidities Age;Comorbidity 3+    Comorbidities TBI, Dementia, DM    Examination-Activity Limitations Carry;Other   walking   Examination-Participation Restrictions Community Activity;Yard Work    Stability/Clinical Decision Making Stable/Uncomplicated    Rehab Potential Good    PT Frequency 2x / week    PT Duration 12 weeks    PT Treatment/Interventions ADLs/Self Care Home Management;Cryotherapy;Moist Heat;DME Instruction;Gait training;Stair training;Functional mobility training;Therapeutic  activities;Therapeutic exercise;Balance training;Neuromuscular re-education;Patient/family education;Manual techniques;Passive range of motion    PT Next Visit Plan Initiate balance activities for improved static and dynamic balance    PT Home Exercise Plan To be initiated nex 1-2 sessions as appropriate.    Consulted and Agree with Plan of Care Patient;Family member/caregiver   spouse- Mickel Baas            Patient will benefit from skilled therapeutic intervention in order to improve the following deficits and impairments:  Abnormal gait, Decreased activity tolerance,  Decreased balance, Decreased coordination, Decreased cognition, Decreased mobility, Decreased safety awareness, Decreased strength, Impaired perceived functional ability  Visit Diagnosis: Abnormality of gait and mobility  Difficulty in walking, not elsewhere classified  Muscle weakness (generalized)  Other lack of coordination     Problem List Patient Active Problem List   Diagnosis Date Noted   Iron deficiency anemia 07/16/2019   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 07/16/2019   Serum potassium elevated 07/16/2019   High risk medications (not anticoagulants) long-term use 07/16/2019   Elevated LDL cholesterol level 06/25/2018   Healthcare maintenance 12/13/2017   Encounter for screening for malignant neoplasm of respiratory organs 07/12/2017   Overgrown toenails 07/12/2017   Vitamin D insufficiency 04/28/2017   Closed fracture of one rib of left side 04/28/2017   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 ([place]) 04/28/2017   Diabetes mellitus due to underlying condition without complication, with long-term current use of insulin ([place]) 04/06/2017   Elbow swelling, left 04/06/2017   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[place]) 04/06/2017   Fatigue 04/06/2017   Hypertension associated with diabetes ([place]) 04/06/2017   Screening for AAA (abdominal aortic aneurysm) 04/06/2017   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 [place], [place], [address] Phone: [phone]   Fax:  [phone]  Name: DAMEION BRILES MRN:  [id number] Date of Birth: 1945/06/16

## 2021-02-24 ENCOUNTER — Ambulatory Visit: Payer: Medicare HMO

## 2021-02-24 ENCOUNTER — Other Ambulatory Visit: Payer: Self-pay

## 2021-02-24 DIAGNOSIS — R278 Other lack of coordination: Secondary | ICD-10-CM

## 2021-02-24 DIAGNOSIS — M6281 Muscle weakness (generalized): Secondary | ICD-10-CM

## 2021-02-24 DIAGNOSIS — R2689 Other abnormalities of gait and mobility: Secondary | ICD-10-CM

## 2021-02-24 DIAGNOSIS — R2681 Unsteadiness on feet: Secondary | ICD-10-CM

## 2021-02-24 DIAGNOSIS — R269 Unspecified abnormalities of gait and mobility: Secondary | ICD-10-CM | POA: Diagnosis not present

## 2021-02-24 NOTE — Therapy (Signed)
[place] MAIN [facility] SERVICES [address] [place], [place], [address] Phone: [phone]   Fax:  [phone]  Physical Therapy Treatment  Patient Details  Name: Dustin Ferrell MRN: [id number] Date of Birth: April 17, 1946 Referring Provider (PT): Dr. Joselyn Arrow   Encounter Date: 02/24/2021   PT End of Session - 02/24/21 1541     Visit Number 3    Number of Visits 25    Date for PT Re-Evaluation 05/13/21    Authorization Time Period Initial Certification = 07/11/9415= 05/13/2021    PT Start Time 1433    PT Stop Time 1516    PT Time Calculation (min) 43 min    Equipment Utilized During Treatment Gait belt    Activity Tolerance Patient tolerated treatment well    Behavior During Therapy WFL for tasks assessed/performed             Past Medical History:  Diagnosis Date   Anxiety    associated with frontal temporal behavioral dementia   Dementia with behavioral disturbance ([place])    Diabetes mellitus without complication ([place])    History of traumatic head injury    Hypertension     Past Surgical History:  Procedure Laterality Date   Eye Socket fracture repair Right    HERNIA REPAIR     ROTATOR CUFF REPAIR Left    TONSILLECTOMY     WRIST FRACTURE SURGERY Right     There were no vitals filed for this visit.   Subjective Assessment - 02/24/21 1437     Subjective No pain, reports no falls or near-falls.    Pertinent History Per note submitted by Dr. Joselyn Arrow on 01/04/2021:1. 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  - Agree with wife, patient should not be unaccompanied to due concern for fall's risk   - Home health as below     - On valproic acid/depakote for behavior as well    2. 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 Alcohol use disorder- still drinks 2-3 beers a day, sometimes 7-8 beers a day  - Discontinued alcohol use (July 2022). Encouraged efforts to continue to avoid alcohol     4. Imbalance - multifactorial (alcohol, TBI, diabetic neuropathy), now with multiple falls   - Home health referral for Physical therapy, occupational therapy, home health aide, respite care (as wife is primary caretaker, and patient should not be left unaccompanied)     - Agree with wife, patient should have electric wheelchair/assistive device to assist with mobility, especially while traveling   - Discussed fall's risk   There are a few interventions the patient should consider to prevent falls: Be careful, Avoid high heel footwear, Use appropriate assistive device, Avoid hazards in your path, (toys, pets, wires, etc.), Have good light when you walk, Use a nonskid shower mat/shower chair, Use handrails and grab bars. Check out www.stopfalls.org for more information.    5. Obstructive sleep apnea   - Home sleep study - not completed, patient deferred due to stated noncompliance/per preference   -.    6. Type  2 diabetes mellitus- on insulin, per primary care provider     7. Tobacco use-1 ppd in the past, attempting   - Current every day smoker   - Encouraged smoking cessation     8. High ASCVD score - represents an increased risk of heart attack or stroke. Please continue to work on aggressive vascular risk factors reduction e.g. blood pressure control, no smoking, blood sugar, and cholesterol management. Consider talking to your primary care physician if you are not on antiplatelet and statin medication.    Limitations Standing;Walking;House hold activities    How long can you sit comfortably? no restriction    How long can you stand comfortably? 15 min    How long can you walk comfortably? 10 min     Patient Stated Goals I want to improve my balance and not fall    Currently in Pain? No/denies              INTERVENTIONS:  Neuromuscular re-education:   Saccades: WFL Smooth pursuits: saccadic Head impulse testing: difficulty moving pt head due to increased stiffness, even with cues.  Pt  did appear to have corrective saccades.  Indicates patient may benefit from gaze stabilization exercises.  At support surface with CGA- min assist  1) NBOS- EC 4x30 sec- uses step strategy, reaches to steady self, tends to lose balance posteriorly 2) feet apart - EO  with head turns, horizontal, vertical 10x each 3) semi tandem stance 2x30 sec each LE --progressed to dual task of naming animals, foods - each LE, maintains balance, but has difficulty with cognitive component of task 4) tandem stance 2x30 each LE; very challenging, pt utilizes intermittent UE support to finger-touch support on bar On airex: 1) semi-tandem 2x30 sec 2) NBOS 60 sec 3) NBOS EC 4x30 sec; extremely challenging, multiple losses of balance posteriorly, requires UE support and min a from PT to regain balance 4) NBOS, vertical, horizontal head turns EO 10x each. Challenging.  Pt takes a couple rest breaks throughout, reports fatigue in ankles.   In hallway: CGA-min a Ambulating down hallway with dual task of reading sticky notes (also to incorporate head turns) - some variability in BOS, but no LOB 2x length of hallway. Ambulating down hallway with vertical head turns - 4x length of hallway; very challenging for pt. Uses of step strategy, gait becomes increasingly ataxic Ambulating down hallway with semi-tandem gait 2x; extremely challenging for pt    Education provided throughout session via VC/TC and demonstration to facilitate movement at target joints and correct muscle activation for all exercises performed.          PT Education - 02/24/21 1541     Education Details exercise technique, body mechanics,  findings of visual testing    Person(s) Educated Patient    Methods Explanation;Demonstration;Verbal cues    Comprehension Verbalized understanding;Returned demonstration;Need further instruction              PT Short Term Goals - 02/18/21 1454       PT SHORT TERM GOAL #1   Title Pt will be independent with HEP in order to improve strength and balance in order to decrease fall risk and improve function at home and work.    Time 6    Period Weeks    Status New    Target Date 04/01/21      PT SHORT TERM GOAL #4   Title --    Baseline --    Time --  Period --    Status --    Target Date --               PT Long Term Goals - 02/18/21 1458       PT LONG TERM GOAL #1   Title Pt will improve FOTO to target score of 60 display perceived improvements in ability to complete ADL's.    Baseline 02/18/2021= 55%    Time 12    Period Weeks    Status New    Target Date 05/13/21      PT LONG TERM GOAL #2   Title Pt will decrease 5TSTS by at least 3 seconds in order to demonstrate clinically significant improvement in LE strength.    Baseline 02/18/2021= 15 sec    Time 12    Period Weeks    Status New      PT LONG TERM GOAL #3   Title Patient will increase Functional Gait Assessment score to >22/30 as to reduce fall risk and improve dynamic gait safety with community ambulation.    Baseline 02/18/2021= 17/30    Time 12    Period Weeks    Status New    Target Date 05/13/21      PT LONG TERM GOAL #4   Title Pt will increase 10MWT by at least 0.13 m/s in order to demonstrate clinically significant improvement in community ambulation.    Baseline 02/18/2021= 0.89 m/s without AD    Time 12    Period Weeks    Status New    Target Date 05/13/21                   Plan - 02/24/21 1542     Clinical Impression Statement PT assessed pt saccades [facility]) and smooth pursuits (abnormal, saccadic), as well as head impulse testing (abnormal), indicating pt could possibly  benefit from some gaze stabilization exercises as component of balance training. Pt was able to progress to balance exercises on compliant surface. He was most challenged with EC tasks, as well as tasks involving vertical, and horizontal head turns. The pt will benefit from further skilled PT to improve static and dynamic balance and mobility to decrease fall risk.    Personal Factors and Comorbidities Age;Comorbidity 3+    Comorbidities TBI, Dementia, DM    Examination-Activity Limitations Carry;Other   walking   Examination-Participation Restrictions Community Activity;Yard Work    Stability/Clinical Decision Making Stable/Uncomplicated    Rehab Potential Good    PT Frequency 2x / week    PT Duration 12 weeks    PT Treatment/Interventions ADLs/Self Care Home Management;Cryotherapy;Moist Heat;DME Instruction;Gait training;Stair training;Functional mobility training;Therapeutic activities;Therapeutic exercise;Balance training;Neuromuscular re-education;Patient/family education;Manual techniques;Passive range of motion    PT Next Visit Plan Initiate balance activities for improved static and dynamic balance    PT Home Exercise Plan To be initiated nex 1-2 sessions as appropriate.; no updates on this date    Consulted and Agree with Plan of Care Patient   spouse- Mickel Baas            Patient will benefit from skilled therapeutic intervention in order to improve the following deficits and impairments:  Abnormal gait, Decreased activity tolerance, Decreased balance, Decreased coordination, Decreased cognition, Decreased mobility, Decreased safety awareness, Decreased strength, Impaired perceived functional ability  Visit Diagnosis: Other abnormalities of gait and mobility  Unsteadiness on feet  Other lack of coordination  Muscle weakness (generalized)     Problem List Patient Active Problem List   Diagnosis  Date Noted   Iron deficiency anemia 07/16/2019   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  07/16/2019   Serum potassium elevated 07/16/2019   High risk medications (not anticoagulants) long-term use 07/16/2019   Elevated LDL cholesterol level 06/25/2018   Healthcare maintenance 12/13/2017   Encounter for screening for malignant neoplasm of respiratory organs 07/12/2017   Overgrown toenails 07/12/2017   Vitamin D insufficiency 04/28/2017   Closed fracture of one rib of left side 04/28/2017   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 ([place]) 04/28/2017   Diabetes mellitus due to underlying condition without complication, with long-term current use of insulin ([place]) 04/06/2017   Elbow swelling, left 04/06/2017   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[place]) 04/06/2017   Fatigue 04/06/2017   Hypertension associated with diabetes ([place]) 04/06/2017   Screening for AAA (abdominal aortic aneurysm) 04/06/2017   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

## 2021-03-01 ENCOUNTER — Ambulatory Visit: Payer: Medicare HMO | Attending: Internal Medicine

## 2021-03-01 ENCOUNTER — Other Ambulatory Visit: Payer: Self-pay

## 2021-03-01 DIAGNOSIS — R262 Difficulty in walking, not elsewhere classified: Secondary | ICD-10-CM | POA: Insufficient documentation

## 2021-03-01 DIAGNOSIS — M6281 Muscle weakness (generalized): Secondary | ICD-10-CM | POA: Diagnosis present

## 2021-03-01 DIAGNOSIS — R2681 Unsteadiness on feet: Secondary | ICD-10-CM | POA: Insufficient documentation

## 2021-03-01 DIAGNOSIS — R269 Unspecified abnormalities of gait and mobility: Secondary | ICD-10-CM | POA: Insufficient documentation

## 2021-03-01 NOTE — Therapy (Signed)
[place] MAIN [facility] SERVICES [address] [place], [place], [address] Phone: [phone]   Fax:  [phone]  Physical Therapy Treatment  Patient Details  Name: Dustin Ferrell MRN: [id number] Date of Birth: September 05, 1945 Referring Provider (PT): Dr. Joselyn Arrow   Encounter Date: 03/01/2021   PT End of Session - 03/01/21 1523     Visit Number 4    Number of Visits 25    Date for PT Re-Evaluation 05/13/21    Authorization Time Period Initial Certification = 2/42/6834= 05/13/2021    PT Start Time 1516    PT Stop Time 1602    PT Time Calculation (min) 46 min    Equipment Utilized During Treatment Gait belt    Activity Tolerance Patient tolerated treatment well    Behavior During Therapy WFL for tasks assessed/performed             Past Medical History:  Diagnosis Date   Anxiety    associated with frontal temporal behavioral dementia   Dementia with behavioral disturbance    Diabetes mellitus without complication ([place])    History of traumatic head injury    Hypertension     Past Surgical History:  Procedure Laterality Date   Eye Socket fracture repair Right    HERNIA REPAIR     ROTATOR CUFF REPAIR Left    TONSILLECTOMY     WRIST FRACTURE SURGERY Right     There were no vitals filed for this visit.  INTERVENTIONS:   Instruction in the following LE exercises at support bar (as much as possible without UE support)  ALL Exercises performed x 10-12 reps bilaterally today.   Standing hip march Standing hip ext Standing hip abd Standing mini squat Standing Ham curl Standing Heel raises Standing Toe raises Sit to stand without UE support.  Education provided throughout session via VC/TC and demonstration to facilitate movement at target joints and correct muscle activation for  exercises performed.       Access Code: [email or web address] URL: [email or web address] Date: 03/01/2021 Prepared by: Sande Brothers  Exercises Standing March with Counter Support - 1 x daily - 3 x weekly - 3 sets - 10 reps - 2 hold Standing Knee Flexion with Counter Support - 1 x daily - 3 x weekly - 3 sets - 10 reps - 2 hold Standing Hip Extension with Unilateral Counter Support - 1 x daily - 3 x weekly - 3 sets - 10 reps - 2 hold Mini Squat with Counter Support - 1 x daily - 3 x weekly - 3 sets - 10 reps - 2 hold Standing Hamstring Curl with Chair Support - 1 x daily - 3 x weekly - 3 sets - 10 reps - 2 hold Standing Heel Raises - 1 x daily - 3 x weekly - 3 sets - 10 reps - 2 hold Standing Ankle Dorsiflexion with Chair Support - 1 x daily - 3 x weekly - 3 sets - 10 reps - 2 hold Sit to Stand Without Arm Support - 1 x daily - 3 x weekly - 3 sets - 10 reps - 2 hold   Clinical Impression: Patient performed well with all new therex. He was challenged once instructed to attempt exercises without using UE support and difficulty holding contraction without some form of UE support. He and his wife report well pleased to have some exercises to work on while his away on vacation for next 2 weeks in [place]. He was well motivated and  no significant trembling today. The pt will benefit from further skilled PT to improve static and dynamic balance and mobility to decrease fall risk.                     PT Education - 03/01/21 1610     Education Details Exercise technique for HEP    Person(s) Educated Patient    Methods Explanation;Demonstration;Tactile cues;Verbal cues;Handout    Comprehension Verbalized understanding;Returned demonstration;Verbal cues required;Need further instruction;Tactile cues required              PT Short Term Goals - 02/18/21 1454       PT SHORT TERM GOAL #1   Title Pt will be independent with HEP in order to improve strength and balance in order to decrease fall risk and improve function at home and work.    Time 6    Period Weeks    Status New    Target Date 04/01/21       PT SHORT TERM GOAL #4   Title --    Baseline --    Time --    Period --    Status --    Target Date --               PT Long Term Goals - 02/18/21 1458       PT LONG TERM GOAL #1   Title Pt will improve FOTO to target score of 60 display perceived improvements in ability to complete ADL's.    Baseline 02/18/2021= 55%    Time 12    Period Weeks    Status New    Target Date 05/13/21      PT LONG TERM GOAL #2   Title Pt will decrease 5TSTS by at least 3 seconds in order to demonstrate clinically significant improvement in LE strength.    Baseline 02/18/2021= 15 sec    Time 12    Period Weeks    Status New      PT LONG TERM GOAL #3   Title Patient will increase Functional Gait Assessment score to >22/30 as to reduce fall risk and improve dynamic gait safety with community ambulation.    Baseline 02/18/2021= 17/30    Time 12    Period Weeks    Status New    Target Date 05/13/21      PT LONG TERM GOAL #4   Title Pt will increase 10MWT by at least 0.13 m/s in order to demonstrate clinically significant improvement in community ambulation.    Baseline 02/18/2021= 0.89 m/s without AD    Time 12    Period Weeks    Status New    Target Date 05/13/21                   Plan - 03/01/21 1611     Clinical Impression Statement Patient performed well with all new therex. He was challenged once instructed to attempt exercises without using UE support and difficulty holding contraction without some form of UE support. He and his wife report well pleased to have some exercises to work on while his away on vacation for next 2 weeks in [place]. He was well motivated and no significant trembling today. The pt will benefit from further skilled PT to improve static and dynamic balance and mobility to decrease fall risk.    Personal Factors and Comorbidities Age;Comorbidity 3+    Comorbidities TBI, Dementia, DM    Examination-Activity Limitations Carry;Other  walking    Examination-Participation Restrictions Community Activity;Yard Work    Stability/Clinical Decision Making Stable/Uncomplicated    Rehab Potential Good    PT Frequency 2x / week    PT Duration 12 weeks    PT Treatment/Interventions ADLs/Self Care Home Management;Cryotherapy;Moist Heat;DME Instruction;Gait training;Stair training;Functional mobility training;Therapeutic activities;Therapeutic exercise;Balance training;Neuromuscular re-education;Patient/family education;Manual techniques;Passive range of motion    PT Next Visit Plan Continue with progressive  balance activities for improved static and dynamic balance    PT Home Exercise Plan Access Code: [email or web address]    Consulted and Agree with Plan of Care Patient   spouse- Mickel Baas            Patient will benefit from skilled therapeutic intervention in order to improve the following deficits and impairments:  Abnormal gait, Decreased activity tolerance, Decreased balance, Decreased coordination, Decreased cognition, Decreased mobility, Decreased safety awareness, Decreased strength, Impaired perceived functional ability  Visit Diagnosis: Abnormality of gait and mobility  Difficulty in walking, not elsewhere classified  Muscle weakness (generalized)  Unsteadiness on feet     Problem List Patient Active Problem List   Diagnosis Date Noted   Iron deficiency anemia 07/16/2019   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 07/16/2019   Serum potassium elevated 07/16/2019   High risk medications (not anticoagulants) long-term use 07/16/2019   Elevated LDL cholesterol level 06/25/2018   Healthcare maintenance 12/13/2017   Encounter for screening for malignant neoplasm of respiratory organs 07/12/2017   Overgrown toenails 07/12/2017   Vitamin D insufficiency 04/28/2017   Closed fracture of one rib of left side 04/28/2017   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 ([place]) 04/28/2017   Diabetes mellitus due to underlying condition  without complication, with long-term current use of insulin ([place]) 04/06/2017   Elbow swelling, left 04/06/2017   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[place]) 04/06/2017   Fatigue 04/06/2017   Hypertension associated with diabetes ([place]) 04/06/2017   Screening for AAA (abdominal aortic aneurysm) 04/06/2017   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 [place], [place], [address] Phone: [phone]   Fax:  [phone]  Name: Dustin Ferrell MRN: [id number] Date of Birth: 23-Mar-1946

## 2021-03-04 ENCOUNTER — Ambulatory Visit: Payer: Medicare HMO

## 2021-03-08 ENCOUNTER — Ambulatory Visit: Payer: Medicare HMO

## 2021-03-11 ENCOUNTER — Ambulatory Visit: Payer: Medicare HMO

## 2021-03-16 ENCOUNTER — Ambulatory Visit: Payer: Medicare HMO | Admitting: Physical Therapy

## 2021-03-18 ENCOUNTER — Ambulatory Visit: Payer: Medicare HMO

## 2021-03-23 ENCOUNTER — Ambulatory Visit: Payer: Medicare HMO

## 2021-03-25 ENCOUNTER — Ambulatory Visit: Payer: Medicare HMO

## 2021-03-29 ENCOUNTER — Ambulatory Visit: Payer: Medicare HMO

## 2021-03-31 ENCOUNTER — Ambulatory Visit: Payer: Medicare HMO

## 2021-04-05 ENCOUNTER — Ambulatory Visit: Payer: Medicare HMO

## 2021-04-07 ENCOUNTER — Ambulatory Visit: Payer: Medicare HMO | Admitting: Physical Therapy

## 2021-04-13 ENCOUNTER — Ambulatory Visit: Payer: Medicare HMO | Attending: Internal Medicine | Admitting: Physical Therapy

## 2021-04-13 ENCOUNTER — Encounter: Payer: Self-pay | Admitting: Physical Therapy

## 2021-04-13 ENCOUNTER — Other Ambulatory Visit: Payer: Self-pay

## 2021-04-13 DIAGNOSIS — R262 Difficulty in walking, not elsewhere classified: Secondary | ICD-10-CM | POA: Insufficient documentation

## 2021-04-13 DIAGNOSIS — R278 Other lack of coordination: Secondary | ICD-10-CM | POA: Diagnosis present

## 2021-04-13 DIAGNOSIS — M6281 Muscle weakness (generalized): Secondary | ICD-10-CM | POA: Insufficient documentation

## 2021-04-13 DIAGNOSIS — R2681 Unsteadiness on feet: Secondary | ICD-10-CM | POA: Diagnosis present

## 2021-04-13 DIAGNOSIS — R269 Unspecified abnormalities of gait and mobility: Secondary | ICD-10-CM | POA: Diagnosis present

## 2021-04-13 DIAGNOSIS — R2689 Other abnormalities of gait and mobility: Secondary | ICD-10-CM | POA: Diagnosis not present

## 2021-04-13 NOTE — Therapy (Signed)
[place] MAIN [facility] SERVICES [address] [place], [place], [address] Phone: [phone]   Fax:  [phone]  Physical Therapy Treatment  Patient Details  Name: Dustin Ferrell MRN: [id number] Date of Birth: August 05, 1945 Referring Provider (PT): Dr. Joselyn Arrow   Encounter Date: 04/13/2021   PT End of Session - 04/13/21 1355     Visit Number 5    Number of Visits 25    Date for PT Re-Evaluation 05/13/21    Authorization Time Period Initial Certification = 8/58/8502= 05/13/2021    PT Start Time 1348    PT Stop Time 1430    PT Time Calculation (min) 42 min    Equipment Utilized During Treatment Gait belt    Activity Tolerance Patient tolerated treatment well    Behavior During Therapy WFL for tasks assessed/performed             Past Medical History:  Diagnosis Date   Anxiety    associated with frontal temporal behavioral dementia   Dementia with behavioral disturbance    Diabetes mellitus without complication ([place])    History of traumatic head injury    Hypertension     Past Surgical History:  Procedure Laterality Date   Eye Socket fracture repair Right    HERNIA REPAIR     ROTATOR CUFF REPAIR Left    TONSILLECTOMY     WRIST FRACTURE SURGERY Right     There were no vitals filed for this visit.   Subjective Assessment - 04/13/21 1352     Subjective Patient has missed several weeks of PT, he went to [place] on a cruise. Wife reports 1 fall and multiple near misses. She reports he did need a wheelchair and is reporting increased hip pain. He does have arthritis. Wife reports he hasn't been doing much of the exercise;    Pertinent History Per note submitted by Dr. Joselyn Arrow on 01/04/2021:1. 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  - Agree with wife, patient should not be unaccompanied to due concern for fall's risk    - Home health as below     - On valproic acid/depakote for behavior as well    2. 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 Alcohol use disorder- still drinks 2-3 beers a day, sometimes 7-8 beers a day  - Discontinued alcohol use (July 2022). Encouraged efforts to continue to avoid alcohol     4. Imbalance - multifactorial (alcohol, TBI, diabetic neuropathy), now with multiple falls   - Home health referral for Physical therapy, occupational therapy, home health aide, respite care (as wife is primary caretaker, and patient should not be left unaccompanied)     - Agree with wife, patient should have electric wheelchair/assistive device to assist with mobility, especially while traveling   - Discussed fall's risk   There are a few interventions the patient should consider to prevent falls: Be careful, Avoid high heel footwear, Use appropriate assistive device, Avoid hazards in your path, (toys, pets, wires, etc.), Have good light when you walk, Use a nonskid shower mat/shower chair, Use  handrails and grab bars. Check out www.stopfalls.org for more information.    5. Obstructive sleep apnea   - Home sleep study - not completed, patient deferred due to stated noncompliance/per preference   -.    6. Type 2 diabetes mellitus- on insulin, per primary care provider     7. Tobacco use-1 ppd in the past, attempting   - Current every day smoker   - Encouraged smoking cessation     8. High ASCVD score - represents an increased risk of heart attack or stroke. Please continue to work on aggressive vascular risk factors reduction e.g. blood pressure control, no smoking, blood sugar, and cholesterol management. Consider talking to your primary care physician if you are not on antiplatelet and  statin medication.    Limitations Standing;Walking;House hold activities    How long can you sit comfortably? no restriction    How long can you stand comfortably? 15 min    How long can you walk comfortably? 10 min    Patient Stated Goals I want to improve my balance and not fall    Currently in Pain? Yes    Pain Score 5     Pain Location Hip    Pain Orientation Right;Left    Pain Descriptors / Indicators Aching    Pain Type Chronic pain    Pain Frequency Constant    Aggravating Factors  worse with initial get up after prolonged sitting;    Pain Relieving Factors sitting/rest, hasn't tried heat/ice;    Effect of Pain on Daily Activities limited to short distance walking;    Multiple Pain Sites No                [facility] PT Assessment - 04/13/21 0001       Observation/Other Assessments   Focus on Therapeutic Outcomes (FOTO)  47      ROM / Strength   AROM / PROM / Strength Strength      Standardized Balance Assessment   Five times sit to stand comments  17.3 sec without UE (>15 sec indicates high risk for falls)    10 Meter Walk 10.29 sec, 0.97 m/s without AD, limited community ambulator      Functional Gait  Assessment   Gait Level Surface Walks 20 ft in less than 7 sec but greater than 5.5 sec, uses assistive device, slower speed, mild gait deviations, or deviates 6-10 in outside of the 12 in walkway width.    Change in Gait Speed Makes only minor adjustments to walking speed, or accomplishes a change in speed with significant gait deviations, deviates 10-15 in outside the 12 in walkway width, or changes speed but loses balance but is able to recover and continue walking.    Gait with Horizontal Head Turns Performs head turns with moderate changes in gait velocity, slows down, deviates 10-15 in outside 12 in walkway width but recovers, can continue to walk.    Gait with Vertical Head Turns Performs task with moderate change in gait velocity, slows down, deviates 10-15 in  outside 12 in walkway width but recovers, can continue to walk.    Gait and Pivot Turn Pivot turns safely within 3 sec and stops quickly with no loss of balance.    Step Over Obstacle Is able to step over one shoe box (4.5 in total height) without changing gait speed. No evidence of imbalance.    Gait with Narrow Base of Support Ambulates less than 4 steps heel to toe or cannot  perform without assistance.    Gait with Eyes Closed Walks 20 ft, slow speed, abnormal gait pattern, evidence for imbalance, deviates 10-15 in outside 12 in walkway width. Requires more than 9 sec to ambulate 20 ft.    Ambulating Backwards Walks 20 ft, uses assistive device, slower speed, mild gait deviations, deviates 6-10 in outside 12 in walkway width.    Steps Two feet to a stair, must use rail.    Total Score 14             TREATMENT: Instructed patient in outcome measures to address progress towards goals, see above  Patient reports new onset of hip pain (L>R) isolated to posterior hip  PT assessed lumbar spine:  (+) SLR on left Reports reduced pain with passive hip flexion (+) FABER test on left Patient transitioned to prone PT assess spinal mobility with hypomobility noted at L3, L4, L5 with moderate pain reported at L4/L5 Patient does report reduced pain with gentle lumbopelvic distraction  Educated patient in proper positioning with cues to use lumbar roll when sitting for less sacral sitting  Recommend patient limit repetitive bending/stooping for better comfort/tolerance;  Educated patient in seated piriformis stretch 20 sec hold x1 rep each LE, with cues for proper positioning; Recommend patient stretch 3x a day  Patient tolerated well. He reports no increase in pain with exercise;                          PT Short Term Goals - 04/13/21 1356       PT SHORT TERM GOAL #1   Title Pt will be independent with HEP in order to improve strength and balance in order to decrease  fall risk and improve function at home and work.    Baseline 11/15: not doing HEP right now;    Time 6    Period Weeks    Status On-going    Target Date 04/01/21               PT Long Term Goals - 04/13/21 1356       PT LONG TERM GOAL #1   Title Pt will improve FOTO to target score of 60 display perceived improvements in ability to complete ADL's.    Baseline 02/18/2021= 55%    Time 12    Period Weeks    Status New    Target Date 05/13/21      PT LONG TERM GOAL #2   Title Pt will decrease 5TSTS by at least 3 seconds in order to demonstrate clinically significant improvement in LE strength.    Baseline 02/18/2021= 15 sec, 11/15: 17.3 sec    Time 12    Period Weeks    Status Not Met    Target Date 05/13/21      PT LONG TERM GOAL #3   Title Patient will increase Functional Gait Assessment score to >22/30 as to reduce fall risk and improve dynamic gait safety with community ambulation.    Baseline 02/18/2021= 17/30, 11/15: 14/30    Time 12    Period Weeks    Status Not Met    Target Date 05/13/21      PT LONG TERM GOAL #4   Title Pt will increase 10MWT by at least 0.13 m/s in order to demonstrate clinically significant improvement in community ambulation.    Baseline 02/18/2021= 0.89 m/s without AD, 11/15: 0.97 m/s    Time 12    Period Weeks  Status Partially Met    Target Date 05/13/21                   Plan - 04/13/21 1432     Clinical Impression Statement Patient motivated and participated well within session. He has missed several weeks of PT after travelling outside the country. His wife reports he did have 1 fall and multiple near misses with difficulty getting through the airport and on the cruise. Patient reports a recent increase in BLE hip pain (L>R). PT assessed back/hip with positive lumbar nerve impingement on left side. He reports pain is along left piriformis. Instructed patient in piriformis stretch and lumbar flexion stretch. He reports  repetitive bending/stooping trying to get up leaves. Educated patient to limit bending/stooping right now and work on sitting with lumbar support to help alleviate stress to lumbar spine. Recommend patient heat/ice and stretch for a few weeks to see if pain resolves. If pain persists, recommend patient follow up with MD for formal back assessment. He does exhibit a decline in balance and mobility likely from missing therapy and increased pain. Will resume PT for balance/gait safety and LE strengthening; Continue with current plan of care;    Personal Factors and Comorbidities Age;Comorbidity 3+    Comorbidities TBI, Dementia, DM    Examination-Activity Limitations Carry;Other   walking   Examination-Participation Restrictions Community Activity;Yard Work    Stability/Clinical Decision Making Stable/Uncomplicated    Rehab Potential Good    PT Frequency 2x / week    PT Duration 12 weeks    PT Treatment/Interventions ADLs/Self Care Home Management;Cryotherapy;Moist Heat;DME Instruction;Gait training;Stair training;Functional mobility training;Therapeutic activities;Therapeutic exercise;Balance training;Neuromuscular re-education;Patient/family education;Manual techniques;Passive range of motion    PT Next Visit Plan Continue with progressive  balance activities for improved static and dynamic balance    PT Home Exercise Plan Access Code: [email or web address]    Consulted and Agree with Plan of Care Patient   spouse- Mickel Baas            Patient will benefit from skilled therapeutic intervention in order to improve the following deficits and impairments:  Abnormal gait, Decreased activity tolerance, Decreased balance, Decreased coordination, Decreased cognition, Decreased mobility, Decreased safety awareness, Decreased strength, Impaired perceived functional ability  Visit Diagnosis: Other abnormalities of gait and mobility  Abnormality of gait and mobility  Difficulty in walking, not elsewhere  classified  Muscle weakness (generalized)  Unsteadiness on feet     Problem List Patient Active Problem List   Diagnosis Date Noted   Iron deficiency anemia 07/16/2019   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 07/16/2019   Serum potassium elevated 07/16/2019   High risk medications (not anticoagulants) long-term use 07/16/2019   Elevated LDL cholesterol level 06/25/2018   Healthcare maintenance 12/13/2017   Encounter for screening for malignant neoplasm of respiratory organs 07/12/2017   Overgrown toenails 07/12/2017   Vitamin D insufficiency 04/28/2017   Closed fracture of one rib of left side 04/28/2017   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 ([place]) 04/28/2017   Diabetes mellitus due to underlying condition without complication, with long-term current use of insulin ([place]) 04/06/2017   Elbow swelling, left 04/06/2017   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[place]) 04/06/2017   Fatigue 04/06/2017   Hypertension associated with diabetes ([place]) 04/06/2017   Screening for AAA (abdominal aortic aneurysm) 04/06/2017   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

## 2021-04-15 ENCOUNTER — Ambulatory Visit: Payer: Medicare HMO

## 2021-04-15 ENCOUNTER — Other Ambulatory Visit: Payer: Self-pay

## 2021-04-15 DIAGNOSIS — R2689 Other abnormalities of gait and mobility: Secondary | ICD-10-CM | POA: Diagnosis not present

## 2021-04-15 DIAGNOSIS — M6281 Muscle weakness (generalized): Secondary | ICD-10-CM

## 2021-04-15 DIAGNOSIS — R269 Unspecified abnormalities of gait and mobility: Secondary | ICD-10-CM

## 2021-04-15 DIAGNOSIS — R262 Difficulty in walking, not elsewhere classified: Secondary | ICD-10-CM

## 2021-04-15 DIAGNOSIS — R278 Other lack of coordination: Secondary | ICD-10-CM

## 2021-04-15 NOTE — Therapy (Signed)
[place] MAIN [facility] SERVICES [address] [place], [place], [address] Phone: [phone]   Fax:  [phone]  Physical Therapy Treatment  Patient Details  Name: Dustin Ferrell MRN: [id number] Date of Birth: 13-Oct-1945 Referring Provider (PT): Dr. Joselyn Arrow   Encounter Date: 04/15/2021   PT End of Session - 04/15/21 1616     Visit Number 6    Number of Visits 25    Date for PT Re-Evaluation 05/13/21    Authorization Time Period Initial Certification = 7/58/8325= 05/13/2021    PT Start Time 1430    PT Stop Time 1514    PT Time Calculation (min) 44 min    Equipment Utilized During Treatment Gait belt    Activity Tolerance Patient tolerated treatment well    Behavior During Therapy WFL for tasks assessed/performed             Past Medical History:  Diagnosis Date   Anxiety    associated with frontal temporal behavioral dementia   Dementia with behavioral disturbance    Diabetes mellitus without complication ([place])    History of traumatic head injury    Hypertension     Past Surgical History:  Procedure Laterality Date   Eye Socket fracture repair Right    HERNIA REPAIR     ROTATOR CUFF REPAIR Left    TONSILLECTOMY     WRIST FRACTURE SURGERY Right     There were no vitals filed for this visit.   Subjective Assessment - 04/15/21 1601     Subjective Patient reports having some continued low back pain and reports worse in morning    Pertinent History Per note submitted by Dr. Joselyn Arrow on 01/04/2021:1. 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  - Agree with wife, patient should not be unaccompanied to due concern for fall's risk   - Home health as below     - On valproic acid/depakote for behavior as well    2. History of simple partial seizure - cluster of seizures in 2017, no seizures since 2017    - Continue  Depakote 1000 mg tablet by mouth two times a day (twice daily)   - Trazodone discontinued   - Reviewed Depakote (7.4), CMP   - Will stay on Depakote for behavioral symptoms     3. Alcohol use disorder- still drinks 2-3 beers a day, sometimes 7-8 beers a day  - Discontinued alcohol use (July 2022). Encouraged efforts to continue to avoid alcohol     4. Imbalance - multifactorial (alcohol, TBI, diabetic neuropathy), now with multiple falls   - Home health referral for Physical therapy, occupational therapy, home health aide, respite care (as wife is primary caretaker, and patient should not be left unaccompanied)     - Agree with wife, patient should have electric wheelchair/assistive device to assist with mobility, especially while traveling   - Discussed fall's risk   There are a few interventions the patient should consider to prevent falls: Be careful, Avoid high heel footwear, Use appropriate assistive device, Avoid hazards in your path, (toys, pets, wires, etc.), Have good light when you walk, Use a nonskid shower mat/shower chair, Use handrails and grab bars. Check out www.stopfalls.org for more information.    5. Obstructive sleep apnea   - Home sleep study - not completed, patient deferred due to stated noncompliance/per preference   -.  6. Type 2 diabetes mellitus- on insulin, per primary care provider     7. Tobacco use-1 ppd in the past, attempting   - Current every day smoker   - Encouraged smoking cessation     8. High ASCVD score - represents an increased risk of heart attack or stroke. Please continue to work on aggressive vascular risk factors reduction e.g. blood pressure control, no smoking, blood sugar, and cholesterol management. Consider talking to your primary care physician if you are not on antiplatelet and statin medication.    Limitations Standing;Walking;House hold activities    How long can you sit comfortably? no restriction    How long can you stand comfortably? 15 min    How long  can you walk comfortably? 10 min    Patient Stated Goals I want to improve my balance and not fall    Currently in Pain? Yes    Pain Score 5     Pain Location Buttocks    Pain Orientation Left;Posterior;Upper    Pain Descriptors / Indicators Aching    Pain Type Chronic pain    Pain Onset More than a month ago    Pain Frequency Constant    Aggravating Factors  worse in morning - very stiff    Pain Relieving Factors Changing positions    Effect of Pain on Daily Activities Difficulty with gathering leaves and walking    Multiple Pain Sites No           Interventions:   Patient arrived continuing to complain of lower left sided back to left upper buttock region.   Manual therapy:   Patient presented with Tight B hamstrings and (+) fabers on left and PT performed the following Stretches and PIVM  Prone- PIVM - grade II-III PA lumbar mobs- 30 bouts x 2 (L2 -5 region)  Supine- Gentle long axis distraction- left LE - hold 30 sec x 3 sets (patient reports some pain relief)  Sidelye - Hip flex stretch-Hold 30 sec x 3 each Passive stretching   Supine - Hamstring, Knee to chest, Piriformis-Hold 30 sec x 3 each Passive stretching   Instructed in self stretching:   Seated hamstring x 30 sec x 3 BLE  Seated piriformis x 20 sec x 3 BLE Seated lumbar flex - pushing stool forward x 20 sec x3 then to right forward for left sided stretch x 20 sec x 3.   Patient able to stand at end of session with less unsteadiness and reported feeling "looser"   Education provided throughout session via VC/TC and demonstration to facilitate movement at target joints and correct muscle activation for all testing and exercises performed.   -    Access Code: [email or web address] URL: [email or web address] Date: 04/15/2021 Prepared by: Sande Brothers, PT  Exercises Supine Single Knee to Chest Stretch - 2 x daily - 7 x weekly - 3 sets - 20-30 sec hold Supine Lower Trunk Rotation - 2 x daily - 7 x  weekly - 3 sets - 20-30 hold Seated Thoracic Flexion and Rotation with Swiss Ball - 2 x daily - 7 x weekly - 3 sets - 20-30 hold Seated Flexion Stretch with Swiss Ball - 2 x daily - 7 x weekly - 3 sets - 20-30 hold Seated Figure 4 Piriformis Stretch - 2 x daily - 7 x weekly - 3 sets - 20-30sec hold Seated Hamstring Stretch - 2 x daily - 7 x weekly - 3 sets - 10 reps - 20-30 sec hold  Clinical  Impression: Patient presented with increased left sided low back at onset of session and reported feeling better after manual therapy. He presented with increased tightness throughout his spine with hypomobility and did improve with some manual stretching today. He and his wife verbalized good understanding of issued HEP today and to practice and return to clinic next week to report his compliance and condition. He responded well to all education and reported feeling better after session. Patient will benefit from continued skilled PT services to assist with LE strengthening, dynamic balance and improved mobility to decrease risk of falling and improve quality of life.                         PT Education - 04/15/21 1612     Education Details Importance of flexibility for optimal balance    Person(s) Educated Patient;Spouse    Methods Explanation;Demonstration;Tactile cues;Verbal cues;Handout    Comprehension Verbalized understanding;Returned demonstration;Tactile cues required;Need further instruction;Verbal cues required              PT Short Term Goals - 04/13/21 1356       PT SHORT TERM GOAL #1   Title Pt will be independent with HEP in order to improve strength and balance in order to decrease fall risk and improve function at home and work.    Baseline 11/15: not doing HEP right now;    Time 6    Period Weeks    Status On-going    Target Date 04/01/21               PT Long Term Goals - 04/13/21 1356       PT LONG TERM GOAL #1   Title Pt will improve FOTO to  target score of 60 display perceived improvements in ability to complete ADL's.    Baseline 02/18/2021= 55%    Time 12    Period Weeks    Status New    Target Date 05/13/21      PT LONG TERM GOAL #2   Title Pt will decrease 5TSTS by at least 3 seconds in order to demonstrate clinically significant improvement in LE strength.    Baseline 02/18/2021= 15 sec, 11/15: 17.3 sec    Time 12    Period Weeks    Status Not Met    Target Date 05/13/21      PT LONG TERM GOAL #3   Title Patient will increase Functional Gait Assessment score to >22/30 as to reduce fall risk and improve dynamic gait safety with community ambulation.    Baseline 02/18/2021= 17/30, 11/15: 14/30    Time 12    Period Weeks    Status Not Met    Target Date 05/13/21      PT LONG TERM GOAL #4   Title Pt will increase 10MWT by at least 0.13 m/s in order to demonstrate clinically significant improvement in community ambulation.    Baseline 02/18/2021= 0.89 m/s without AD, 11/15: 0.97 m/s    Time 12    Period Weeks    Status Partially Met    Target Date 05/13/21                   Plan - 04/15/21 1543     Clinical Impression Statement Patient presented with increased left sided low back at onset of session and reported feeling better after manual therapy. He presented with increased tightness throughout his spine with hypomobility and did improve with some  manual stretching today. He and his wife verbalized good understanding of issued HEP today and to practice and return to clinic next week to report his compliance and condition. He responded well to all education and reported feeling better after session. Patient will benefit from continued skilled PT services to assist with LE strengthening, dynamic balance and improved mobility to decrease risk of falling and improve quality of life    Personal Factors and Comorbidities Age;Comorbidity 3+    Comorbidities TBI, Dementia, DM    Examination-Activity Limitations  Carry;Other   walking   Examination-Participation Restrictions Community Activity;Yard Work    Stability/Clinical Decision Making Stable/Uncomplicated    Rehab Potential Good    PT Frequency 2x / week    PT Duration 12 weeks    PT Treatment/Interventions ADLs/Self Care Home Management;Cryotherapy;Moist Heat;DME Instruction;Gait training;Stair training;Functional mobility training;Therapeutic activities;Therapeutic exercise;Balance training;Neuromuscular re-education;Patient/family education;Manual techniques;Passive range of motion    PT Next Visit Plan Continue with progressive  balance activities for improved static and dynamic balance    PT Home Exercise Plan Access Code: [email or web address]; 04/15/2021=?Access Code: [email or web address]    Consulted and Agree with Plan of Care Patient   spouse- Mickel Baas            Patient will benefit from skilled therapeutic intervention in order to improve the following deficits and impairments:  Abnormal gait, Decreased activity tolerance, Decreased balance, Decreased coordination, Decreased cognition, Decreased mobility, Decreased safety awareness, Decreased strength, Impaired perceived functional ability  Visit Diagnosis: Abnormality of gait and mobility  Difficulty in walking, not elsewhere classified  Muscle weakness (generalized)  Other lack of coordination     Problem List Patient Active Problem List   Diagnosis Date Noted   Iron deficiency anemia 07/16/2019   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 07/16/2019   Serum potassium elevated 07/16/2019   High risk medications (not anticoagulants) long-term use 07/16/2019   Elevated LDL cholesterol level 06/25/2018   Healthcare maintenance 12/13/2017   Encounter for screening for malignant neoplasm of respiratory organs 07/12/2017   Overgrown toenails 07/12/2017   Vitamin D insufficiency 04/28/2017   Closed fracture of one rib of left side 04/28/2017   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 ([place])  04/28/2017   Diabetes mellitus due to underlying condition without complication, with long-term current use of insulin ([place]) 04/06/2017   Elbow swelling, left 04/06/2017   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[place]) 04/06/2017   Fatigue 04/06/2017   Hypertension associated with diabetes ([place]) 04/06/2017   Screening for AAA (abdominal aortic aneurysm) 04/06/2017   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 [place], [place], [address] Phone: [phone]   Fax:  [phone]  Name: Dustin Ferrell MRN: [id number] Date of Birth: 01-16-1946

## 2021-04-20 ENCOUNTER — Other Ambulatory Visit: Payer: Self-pay

## 2021-04-20 ENCOUNTER — Encounter: Payer: Self-pay | Admitting: Physical Therapy

## 2021-04-20 ENCOUNTER — Ambulatory Visit: Payer: Medicare HMO | Admitting: Physical Therapy

## 2021-04-20 DIAGNOSIS — R2689 Other abnormalities of gait and mobility: Secondary | ICD-10-CM

## 2021-04-20 DIAGNOSIS — R278 Other lack of coordination: Secondary | ICD-10-CM

## 2021-04-20 DIAGNOSIS — R262 Difficulty in walking, not elsewhere classified: Secondary | ICD-10-CM

## 2021-04-20 DIAGNOSIS — R2681 Unsteadiness on feet: Secondary | ICD-10-CM

## 2021-04-20 DIAGNOSIS — R269 Unspecified abnormalities of gait and mobility: Secondary | ICD-10-CM

## 2021-04-20 DIAGNOSIS — M6281 Muscle weakness (generalized): Secondary | ICD-10-CM

## 2021-04-20 NOTE — Therapy (Signed)
[place] MAIN [facility] SERVICES [address] [place], [place], [address] Phone: [phone]   Fax:  [phone]  Physical Therapy Treatment  Patient Details  Name: Dustin Ferrell MRN: [id number] Date of Birth: 02-Dec-1945 Referring Provider (PT): Dr. Joselyn Arrow   Encounter Date: 04/20/2021   PT End of Session - 04/20/21 1355     Visit Number 7    Number of Visits 25    Date for PT Re-Evaluation 05/13/21    Authorization Time Period Initial Certification = 3/41/9379= 05/13/2021    PT Start Time 1348    PT Stop Time 1430    PT Time Calculation (min) 42 min    Equipment Utilized During Treatment Gait belt    Activity Tolerance Patient tolerated treatment well    Behavior During Therapy WFL for tasks assessed/performed             Past Medical History:  Diagnosis Date   Anxiety    associated with frontal temporal behavioral dementia   Dementia with behavioral disturbance    Diabetes mellitus without complication ([place])    History of traumatic head injury    Hypertension     Past Surgical History:  Procedure Laterality Date   Eye Socket fracture repair Right    HERNIA REPAIR     ROTATOR CUFF REPAIR Left    TONSILLECTOMY     WRIST FRACTURE SURGERY Right     There were no vitals filed for this visit.   Subjective Assessment - 04/20/21 1352     Subjective Patient reports doing much better. he has been doing his stretches and reports his pain is improving.    Pertinent History Per note submitted by Dr. Joselyn Arrow on 01/04/2021:1. 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  - Agree with wife, patient should not be unaccompanied to due concern for fall's risk   - Home health as below     - On valproic acid/depakote for behavior as well    2. 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 Alcohol use disorder- still drinks 2-3 beers a day, sometimes 7-8 beers a day  - Discontinued alcohol use (July 2022). Encouraged efforts to continue to avoid alcohol     4. Imbalance - multifactorial (alcohol, TBI, diabetic neuropathy), now with multiple falls   - Home health referral for Physical therapy, occupational therapy, home health aide, respite care (as wife is primary caretaker, and patient should not be left unaccompanied)     - Agree with wife, patient should have electric wheelchair/assistive device to assist with mobility, especially while traveling   - Discussed fall's risk   There are a few interventions the patient should consider to prevent falls: Be careful, Avoid high heel footwear, Use appropriate assistive device, Avoid hazards in your path, (toys, pets, wires, etc.), Have good light when you walk, Use a nonskid shower mat/shower chair, Use handrails and grab bars. Check out www.stopfalls.org for more information.    5. Obstructive sleep apnea   - Home sleep study - not completed, patient deferred due to stated noncompliance/per  preference   -.    6. Type 2 diabetes mellitus- on insulin, per primary care provider     7. Tobacco use-1 ppd in the past, attempting   - Current every day smoker   - Encouraged smoking cessation     8. High ASCVD score - represents an increased risk of heart attack or stroke. Please continue to work on aggressive vascular risk factors reduction e.g. blood pressure control, no smoking, blood sugar, and cholesterol management. Consider talking to your primary care physician if you are not on antiplatelet and statin medication.    Limitations Standing;Walking;House hold activities    How long can you sit comfortably? no restriction    How long can you stand comfortably? 15 min     How long can you walk comfortably? 10 min    Patient Stated Goals I want to improve my balance and not fall    Currently in Pain? Yes    Pain Score 2     Pain Location Hip    Pain Orientation Left    Pain Descriptors / Indicators Aching;Sore    Pain Type Chronic pain    Pain Onset More than a month ago    Pain Frequency Intermittent    Aggravating Factors  worse in the morning, eases off during day    Pain Relieving Factors changing positions    Effect of Pain on Daily Activities decreased activity tolerance;    Multiple Pain Sites No               TREATMENT:  Ex: Hooklying with moist heat to low back Lumbar trunk rotation x10 reps each direction with cues to avoid painful ROM: Single knee to chest stretch 20 sec hold x2 reps each LE Hamstring stretch SLR passive 30 sec hold   Progressed to ankle pump x10 reps each LE with moderate stretch reported PT performed passive LLE piriformis stretch regular and modified x30 sec hold x1 rep each;  Manual therapy: Patient rolled right sidelying PT performed soft tissue massage to LLE piriformis/posterior hip using rolling stick x5 min with good tolerance reported  Patient reports no pain upon sitting up; NMR: Standing on airex pad: -feet together, eyes open 30 sec hold, eyes closed 10 sec hold x2 reps each -feet apart, arms across chest trunk rotation x5 reps each with min A for safety -alternate toe taps to 6 inch step x10 reps unsupported -standing one foot on airex, one foot on 8 inch step  Unsupported standing 10 sec hold x1 rep each -modified tandem stance, unsupported standing 10 sec hold x1 rep each foot in front Patient had significant difficulty with narrow base of support, modified SLS with increased instability and poor motor control; Patient does require min A for safety and cues for neutral weight shift He does report increased posterior hip pain with standing one foot on airex, one foot on 8 inch step with  increased gluteal strain  Ex: Finished with seated piriformis stretch 30 sec hold x2 reps each  Patient tolerated session well. He reports understanding of LE stretch as part of HEP                       PT Education - 04/20/21 1355     Education Details LE stretches/positioning;    Person(s) Educated Patient;Spouse    Methods Explanation;Verbal cues    Comprehension Verbalized understanding;Returned demonstration;Verbal cues required;Need further instruction  PT Short Term Goals - 04/13/21 1356       PT SHORT TERM GOAL #1   Title Pt will be independent with HEP in order to improve strength and balance in order to decrease fall risk and improve function at home and work.    Baseline 11/15: not doing HEP right now;    Time 6    Period Weeks    Status On-going    Target Date 04/01/21               PT Long Term Goals - 04/13/21 1356       PT LONG TERM GOAL #1   Title Pt will improve FOTO to target score of 60 display perceived improvements in ability to complete ADL's.    Baseline 02/18/2021= 55%    Time 12    Period Weeks    Status New    Target Date 05/13/21      PT LONG TERM GOAL #2   Title Pt will decrease 5TSTS by at least 3 seconds in order to demonstrate clinically significant improvement in LE strength.    Baseline 02/18/2021= 15 sec, 11/15: 17.3 sec    Time 12    Period Weeks    Status Not Met    Target Date 05/13/21      PT LONG TERM GOAL #3   Title Patient will increase Functional Gait Assessment score to >22/30 as to reduce fall risk and improve dynamic gait safety with community ambulation.    Baseline 02/18/2021= 17/30, 11/15: 14/30    Time 12    Period Weeks    Status Not Met    Target Date 05/13/21      PT LONG TERM GOAL #4   Title Pt will increase 10MWT by at least 0.13 m/s in order to demonstrate clinically significant improvement in community ambulation.    Baseline 02/18/2021= 0.89 m/s without AD, 11/15:  0.97 m/s    Time 12    Period Weeks    Status Partially Met    Target Date 05/13/21                   Plan - 04/20/21 1426     Clinical Impression Statement patient motivated and participated well within session. He tolerated stretches and manual therapy well reporting resolution of LE hip pain. Patient instructed in advanced balance tasks. He does have difficulty standing with narrow base of support on airex pad with increased ankle instability. He required min A for stance control. He also exhibits increased instability when standing in modified SLS with one foot on airex and one foot on 8 inch step with increased instability in stance leg. Patient did report increased posterior hip pain with prolonged stance. Finished with seated piriformis stretch. Patient would benefit from additional skilled PT Intervention to improve strength, balance and mobility while reducing hip pain;    Personal Factors and Comorbidities Age;Comorbidity 3+    Comorbidities TBI, Dementia, DM    Examination-Activity Limitations Carry;Other   walking   Examination-Participation Restrictions Community Activity;Yard Work    Stability/Clinical Decision Making Stable/Uncomplicated    Rehab Potential Good    PT Frequency 2x / week    PT Duration 12 weeks    PT Treatment/Interventions ADLs/Self Care Home Management;Cryotherapy;Moist Heat;DME Instruction;Gait training;Stair training;Functional mobility training;Therapeutic activities;Therapeutic exercise;Balance training;Neuromuscular re-education;Patient/family education;Manual techniques;Passive range of motion    PT Next Visit Plan Continue with progressive  balance activities for improved static and dynamic balance    PT  Home Exercise Plan Access Code: [email or web address]; 04/15/2021=?Access Code: [email or web address]    Consulted and Agree with Plan of Care Patient   spouse- Mickel Baas            Patient will benefit from skilled therapeutic intervention in order to improve the  following deficits and impairments:  Abnormal gait, Decreased activity tolerance, Decreased balance, Decreased coordination, Decreased cognition, Decreased mobility, Decreased safety awareness, Decreased strength, Impaired perceived functional ability  Visit Diagnosis: Other abnormalities of gait and mobility  Abnormality of gait and mobility  Difficulty in walking, not elsewhere classified  Muscle weakness (generalized)  Other lack of coordination  Unsteadiness on feet     Problem List Patient Active Problem List   Diagnosis Date Noted   Iron deficiency anemia 07/16/2019   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 07/16/2019   Serum potassium elevated 07/16/2019   High risk medications (not anticoagulants) long-term use 07/16/2019   Elevated LDL cholesterol level 06/25/2018   Healthcare maintenance 12/13/2017   Encounter for screening for malignant neoplasm of respiratory organs 07/12/2017   Overgrown toenails 07/12/2017   Vitamin D insufficiency 04/28/2017   Closed fracture of one rib of left side 04/28/2017   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 ([place]) 04/28/2017   Diabetes mellitus due to underlying condition without complication, with long-term current use of insulin ([place]) 04/06/2017   Elbow swelling, left 04/06/2017   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[place]) 04/06/2017   Fatigue 04/06/2017   Hypertension associated with diabetes ([place]) 04/06/2017   Screening for AAA (abdominal aortic aneurysm) 04/06/2017   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 [place], [place], [address] Phone: [phone]   Fax:  [phone]  Name: Dustin Ferrell MRN: [id number] Date of Birth: 05/19/1946

## 2021-04-26 ENCOUNTER — Ambulatory Visit: Payer: Medicare HMO

## 2021-04-26 ENCOUNTER — Other Ambulatory Visit: Payer: Self-pay

## 2021-04-26 DIAGNOSIS — M6281 Muscle weakness (generalized): Secondary | ICD-10-CM

## 2021-04-26 DIAGNOSIS — R2689 Other abnormalities of gait and mobility: Secondary | ICD-10-CM | POA: Diagnosis not present

## 2021-04-26 DIAGNOSIS — R262 Difficulty in walking, not elsewhere classified: Secondary | ICD-10-CM

## 2021-04-26 DIAGNOSIS — R2681 Unsteadiness on feet: Secondary | ICD-10-CM

## 2021-04-26 DIAGNOSIS — R269 Unspecified abnormalities of gait and mobility: Secondary | ICD-10-CM

## 2021-04-26 NOTE — Therapy (Signed)
[place] MAIN [facility] SERVICES [address] [place], [place], [address] Phone: [phone]   Fax:  [phone]  Physical Therapy Treatment  Patient Details  Name: Dustin Ferrell MRN: [id number] Date of Birth: Dec 14, 1945 Referring Provider (PT): Dr. Joselyn Arrow   Encounter Date: 04/26/2021   PT End of Session - 04/26/21 1559     Visit Number 8    Number of Visits 25    Date for PT Re-Evaluation 05/13/21    Authorization Time Period Initial Certification = 2/99/2426= 05/13/2021    PT Start Time 1600    PT Stop Time 1645    PT Time Calculation (min) 45 min    Equipment Utilized During Treatment Gait belt    Activity Tolerance Patient tolerated treatment well    Behavior During Therapy WFL for tasks assessed/performed             Past Medical History:  Diagnosis Date   Anxiety    associated with frontal temporal behavioral dementia   Dementia with behavioral disturbance    Diabetes mellitus without complication ([place])    History of traumatic head injury    Hypertension     Past Surgical History:  Procedure Laterality Date   Eye Socket fracture repair Right    HERNIA REPAIR     ROTATOR CUFF REPAIR Left    TONSILLECTOMY     WRIST FRACTURE SURGERY Right     There were no vitals filed for this visit.   Subjective Assessment - 04/26/21 1559     Subjective Patient reports having "slight" left posterior hip/back pain. Wife reports she is assisting patient with his stretches to "make sure he does them correctly."    Pertinent History Per note submitted by Dr. Joselyn Arrow on 01/04/2021:1. 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  - Agree with wife, patient should not be unaccompanied to due concern for fall's risk   - Home health as below     - On valproic acid/depakote for behavior as well    2. 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 Alcohol use disorder- still drinks 2-3 beers a day, sometimes 7-8 beers a day  - Discontinued alcohol use (July 2022). Encouraged efforts to continue to avoid alcohol     4. Imbalance - multifactorial (alcohol, TBI, diabetic neuropathy), now with multiple falls   - Home health referral for Physical therapy, occupational therapy, home health aide, respite care (as wife is primary caretaker, and patient should not be left unaccompanied)     - Agree with wife, patient should have electric wheelchair/assistive device to assist with mobility, especially while traveling   - Discussed fall's risk   There are a few interventions the patient should consider to prevent falls: Be careful, Avoid high heel footwear, Use appropriate assistive device, Avoid hazards in your path, (toys, pets, wires, etc.), Have good light when you walk, Use a nonskid shower mat/shower chair, Use handrails and grab bars. Check out www.stopfalls.org for more information.    5. Obstructive sleep apnea   - Home sleep study - not  completed, patient deferred due to stated noncompliance/per preference   -.    6. Type 2 diabetes mellitus- on insulin, per primary care provider     7. Tobacco use-1 ppd in the past, attempting   - Current every day smoker   - Encouraged smoking cessation     8. High ASCVD score - represents an increased risk of heart attack or stroke. Please continue to work on aggressive vascular risk factors reduction e.g. blood pressure control, no smoking, blood sugar, and cholesterol management. Consider talking to your primary care physician if you are not on antiplatelet and statin medication.    Limitations Standing;Walking;House hold activities    How long can you sit comfortably?  no restriction    How long can you stand comfortably? 15 min    How long can you walk comfortably? 10 min    Patient Stated Goals I want to improve my balance and not fall    Currently in Pain? Yes    Pain Score 1     Pain Location Back    Pain Orientation Left;Posterior    Pain Descriptors / Indicators Aching;Sharp    Pain Type Chronic pain    Pain Onset More than a month ago    Pain Frequency Intermittent    Aggravating Factors  Worse with transfers/Morning- getting up- Eases off as day goes by    Pain Relieving Factors Increased movement and stretching.            INTERVENTIONS:  Neuromuscular Re-education:  Performed at Support bar with CGA unless otherwise noted.    -Dynamic marching on airex pad- without UE support- VC to slow down for more emphasis on balance as patient was performing too fast  -Dynamic march with BLE on foor- improved cadence- able to hold march for 1-2 sec without UE support- yet still very unstable  -Dynamic standing in front of chair - performing squat (holding onto rainbow ball - letting ball touch floor then back up and reach up with ball toward ceiling) x 12 reps- VC for correct technique- Patient reported as "Medium"  -Rocker board-A/P weight shift- VC, TC and visual demo from PT.- Patient with difficulty shifting weight anteriorly today and several LOB posteriorly rocking back onto heels.   -Dynamic ant/post weight shift (1 LE on yellow dynadisc and 1 LE on blue airex pad) -Patient with difficulty maintaining balance and unstable with just static standing and loosing balance with wt. Shifting- mostly posteriorly.    Standing on airex pad: -feet together, eyes open 30 sec hold, eyes closed 10 sec hold x2 reps each (Increased sway and intermittent LOB more with eyes closed)  -feet apart, arms across chest trunk rotation x5 reps each with min A for safety -alternate toe taps to 6 inch step x10 reps unsupported -modified tandem stance, unsupported  standing 10 sec hold x 2 reps each foot in front Increased difficulty with NBOS/staggered standing and all activities with eyes closed.   Reviewed and patient performed seated figure 4 stretch - hold 20 sec x 3 each leg and seated hamstring stretch x 20 sec x 3 sets.  Education provided throughout session via VC/TC and demonstration to facilitate movement at target joints and correct muscle activation for all testing and exercises performed.   Clinical Impression: Patient presents with good motivation and participation with all activities today. Treatment focused more on balance activities today due to good pain control. He was challenged with any activity involving narrowed stance or eyes closed and  will benefit from continued training in this area. He was responsive to all VC's today and did improve with practice. He presented more with retropulsion when losing balance overall today. Patient will benefit from continued skilled PT services to assist with LE strengthening, dynamic balance and improved mobility to decrease risk of falling and improve quality of life                   PT Education - 04/27/21 0830     Education Details Balance exercise technique; Review of Hip/back stretching for HEP    Person(s) Educated Patient;Spouse    Methods Explanation;Demonstration;Tactile cues;Verbal cues    Comprehension Verbalized understanding;Returned demonstration;Verbal cues required;Need further instruction;Tactile cues required              PT Short Term Goals - 04/13/21 1356       PT SHORT TERM GOAL #1   Title Pt will be independent with HEP in order to improve strength and balance in order to decrease fall risk and improve function at home and work.    Baseline 11/15: not doing HEP right now;    Time 6    Period Weeks    Status On-going    Target Date 04/01/21               PT Long Term Goals - 04/13/21 1356       PT LONG TERM GOAL #1   Title Pt will  improve FOTO to target score of 60 display perceived improvements in ability to complete ADL's.    Baseline 02/18/2021= 55%    Time 12    Period Weeks    Status New    Target Date 05/13/21      PT LONG TERM GOAL #2   Title Pt will decrease 5TSTS by at least 3 seconds in order to demonstrate clinically significant improvement in LE strength.    Baseline 02/18/2021= 15 sec, 11/15: 17.3 sec    Time 12    Period Weeks    Status Not Met    Target Date 05/13/21      PT LONG TERM GOAL #3   Title Patient will increase Functional Gait Assessment score to >22/30 as to reduce fall risk and improve dynamic gait safety with community ambulation.    Baseline 02/18/2021= 17/30, 11/15: 14/30    Time 12    Period Weeks    Status Not Met    Target Date 05/13/21      PT LONG TERM GOAL #4   Title Pt will increase 10MWT by at least 0.13 m/s in order to demonstrate clinically significant improvement in community ambulation.    Baseline 02/18/2021= 0.89 m/s without AD, 11/15: 0.97 m/s    Time 12    Period Weeks    Status Partially Met    Target Date 05/13/21                   Plan - 04/26/21 0831     Clinical Impression Statement Patient presents with good motivation and participation with all activities today. Treatment focused more on balance activities today due to good pain control. He was challenged with any activity involving narrowed stance or eyes closed and will benefit from continued training in this area. He was responsive to all VC's today and did improve with practice. He presented more with retropulsion when losing balance overall today. Patient will benefit from continued skilled PT services to assist with LE strengthening, dynamic balance and improved  mobility to decrease risk of falling and improve quality of life    Personal Factors and Comorbidities Age;Comorbidity 3+    Comorbidities TBI, Dementia, DM    Examination-Activity Limitations Carry;Other   walking    Examination-Participation Restrictions Community Activity;Yard Work    Stability/Clinical Decision Making Stable/Uncomplicated    Rehab Potential Good    PT Frequency 2x / week    PT Duration 12 weeks    PT Treatment/Interventions ADLs/Self Care Home Management;Cryotherapy;Moist Heat;DME Instruction;Gait training;Stair training;Functional mobility training;Therapeutic activities;Therapeutic exercise;Balance training;Neuromuscular re-education;Patient/family education;Manual techniques;Passive range of motion    PT Next Visit Plan Continue with progressive  balance activities for improved static and dynamic balance    PT Home Exercise Plan Access Code: [email or web address]; 04/15/2021=?Access Code: [email or web address]    Consulted and Agree with Plan of Care Patient   spouse- Mickel Baas            Patient will benefit from skilled therapeutic intervention in order to improve the following deficits and impairments:  Abnormal gait, Decreased activity tolerance, Decreased balance, Decreased coordination, Decreased cognition, Decreased mobility, Decreased safety awareness, Decreased strength, Impaired perceived functional ability  Visit Diagnosis: Abnormality of gait and mobility  Difficulty in walking, not elsewhere classified  Muscle weakness (generalized)  Unsteadiness on feet     Problem List Patient Active Problem List   Diagnosis Date Noted   Iron deficiency anemia 07/16/2019   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 07/16/2019   Serum potassium elevated 07/16/2019   High risk medications (not anticoagulants) long-term use 07/16/2019   Elevated LDL cholesterol level 06/25/2018   Healthcare maintenance 12/13/2017   Encounter for screening for malignant neoplasm of respiratory organs 07/12/2017   Overgrown toenails 07/12/2017   Vitamin D insufficiency 04/28/2017   Closed fracture of one rib of left side 04/28/2017   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 ([place]) 04/28/2017   Diabetes  mellitus due to underlying condition without complication, with long-term current use of insulin ([place]) 04/06/2017   Elbow swelling, left 04/06/2017   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[place]) 04/06/2017   Fatigue 04/06/2017   Hypertension associated with diabetes ([place]) 04/06/2017   Screening for AAA (abdominal aortic aneurysm) 04/06/2017   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

## 2021-04-28 ENCOUNTER — Ambulatory Visit: Payer: Medicare HMO

## 2021-04-28 ENCOUNTER — Other Ambulatory Visit: Payer: Self-pay

## 2021-04-28 DIAGNOSIS — R2689 Other abnormalities of gait and mobility: Secondary | ICD-10-CM | POA: Diagnosis not present

## 2021-04-28 DIAGNOSIS — R269 Unspecified abnormalities of gait and mobility: Secondary | ICD-10-CM

## 2021-04-28 DIAGNOSIS — R278 Other lack of coordination: Secondary | ICD-10-CM

## 2021-04-28 DIAGNOSIS — R2681 Unsteadiness on feet: Secondary | ICD-10-CM

## 2021-04-28 NOTE — Therapy (Signed)
[place] MAIN [facility] SERVICES [address] [place], [place], [address] Phone: [phone]   Fax:  [phone]  Physical Therapy Treatment  Patient Details  Name: Dustin Ferrell MRN: [id number] Date of Birth: 24-May-1946 Referring Provider (PT): Dr. Joselyn Arrow   Encounter Date: 04/28/2021   PT End of Session - 04/28/21 1711     Visit Number 9    Number of Visits 25    Date for PT Re-Evaluation 05/13/21    Authorization Time Period Initial Certification = 6/96/2952= 05/13/2021    PT Start Time 1517    PT Stop Time 1600    PT Time Calculation (min) 43 min    Equipment Utilized During Treatment Gait belt    Activity Tolerance Patient tolerated treatment well    Behavior During Therapy WFL for tasks assessed/performed             Past Medical History:  Diagnosis Date   Anxiety    associated with frontal temporal behavioral dementia   Dementia with behavioral disturbance    Diabetes mellitus without complication ([place])    History of traumatic head injury    Hypertension     Past Surgical History:  Procedure Laterality Date   Eye Socket fracture repair Right    HERNIA REPAIR     ROTATOR CUFF REPAIR Left    TONSILLECTOMY     WRIST FRACTURE SURGERY Right     There were no vitals filed for this visit.    Subjective Assessment - 04/28/21 1520     Subjective Patient reports 3/10 left hip pain today. Denies falls since last session. Reports stretching 2x daily at home.    Pertinent History Per note submitted by Dr. Joselyn Arrow on 01/04/2021:1. 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  - Agree with wife, patient should not be unaccompanied to due concern for fall's risk   - Home health as below     - On valproic acid/depakote for behavior as well    2. History of simple partial seizure - cluster of seizures in 2017, no  seizures since 2017    - Continue Depakote 1000 mg tablet by mouth two times a day (twice daily)   - Trazodone discontinued   - Reviewed Depakote (7.4), CMP   - Will stay on Depakote for behavioral symptoms     3. Alcohol use disorder- still drinks 2-3 beers a day, sometimes 7-8 beers a day  - Discontinued alcohol use (July 2022). Encouraged efforts to continue to avoid alcohol     4. Imbalance - multifactorial (alcohol, TBI, diabetic neuropathy), now with multiple falls   - Home health referral for Physical therapy, occupational therapy, home health aide, respite care (as wife is primary caretaker, and patient should not be left unaccompanied)     - Agree with wife, patient should have electric wheelchair/assistive device to assist with mobility, especially while traveling   - Discussed fall's risk   There are a few interventions the patient should consider to prevent falls: Be careful, Avoid high heel footwear, Use appropriate assistive device, Avoid hazards in your path, (toys, pets, wires, etc.), Have good light when you walk, Use a nonskid shower mat/shower chair, Use handrails and grab bars. Check out www.stopfalls.org for more information.    5. Obstructive sleep apnea   - Home sleep study - not completed, patient deferred due to  stated noncompliance/per preference   -.    6. Type 2 diabetes mellitus- on insulin, per primary care provider     7. Tobacco use-1 ppd in the past, attempting   - Current every day smoker   - Encouraged smoking cessation     8. High ASCVD score - represents an increased risk of heart attack or stroke. Please continue to work on aggressive vascular risk factors reduction e.g. blood pressure control, no smoking, blood sugar, and cholesterol management. Consider talking to your primary care physician if you are not on antiplatelet and statin medication.    Limitations Standing;Walking;House hold activities    How long can you sit comfortably? no restriction    How long can you stand  comfortably? 15 min    How long can you walk comfortably? 10 min    Patient Stated Goals I want to improve my balance and not fall    Currently in Pain? Yes    Pain Score 2     Pain Location Back    Pain Orientation Left;Posterior    Pain Onset More than a month ago            Neuro Re-Ed:  -Dynamic marching on airex pad- without UE support x 36mn, pt rated as medium.   -Modified tandem stance with forward press 1x15 each LE forward -Modified tandem stance with russian twist 1x10 each LE forward  - lateral stepping over hedgehogs 1x20, cuing for no UE support and maintaining wide BOS for foot clearance. -forward backward stepping over hedgehogs 1x15 leading with RLE, 1x8 leading with LLE. Leading with LLE was significantly challenging with posterior lean and multiple near LOB with mod A from SPT.  Standing on airex pad: -feet together, eyes open 30 sec hold, eyes closed 30 sec hold x2 reps each (Increased sway with eyes closed)  -feet apart, eyes open 30 sec hold, eyes closed 30 sec hold x2 reps each. -feet apart, cross body reaches 1x236m wide BOS, 1x2m60mnarrow BOS, rated as medium. -alternate toe taps to 6 inch step x10 reps  - Airex lateral podium 1x12 with airex to 6" step. Rated as medium. Intermittent UE use needed at fatigue increased. - Lateral step ups from hard surface to 6" step 1x12. Rated as medium-hard. Intermittent UE use needed at fatigue increased.   - Seated figure 4 stretch - hold 20 sec x 3 each leg and seated hamstring stretch x 20 sec x 3 sets.    Pt educated throughout session about proper posture and technique with exercises. Improved exercise technique, movement at target joints, use of target muscles after min to mod verbal, visual, tactile cues.   Patient demonstrated improved balance with eyes closed today although still challenging. Pt was most challenged by AP weight shifting and forward backward stepping when leading with LLE and would benefit  from addressing this in future sessions. Pt will continue to benefit from skilled PT to address weakness, gait deviations, and balance to improve safety with ambulation and ADLs and decrease falls risk.                      PT Education - 04/28/21 1711     Education Details Exercise technique    Person(s) Educated Patient    Methods Explanation;Demonstration;Tactile cues;Verbal cues    Comprehension Verbal cues required;Returned demonstration;Verbalized understanding;Tactile cues required              PT Short Term Goals - 04/13/21 1356  PT SHORT TERM GOAL #1   Title Pt will be independent with HEP in order to improve strength and balance in order to decrease fall risk and improve function at home and work.    Baseline 11/15: not doing HEP right now;    Time 6    Period Weeks    Status On-going    Target Date 04/01/21               PT Long Term Goals - 04/13/21 1356       PT LONG TERM GOAL #1   Title Pt will improve FOTO to target score of 60 display perceived improvements in ability to complete ADL's.    Baseline 02/18/2021= 55%    Time 12    Period Weeks    Status New    Target Date 05/13/21      PT LONG TERM GOAL #2   Title Pt will decrease 5TSTS by at least 3 seconds in order to demonstrate clinically significant improvement in LE strength.    Baseline 02/18/2021= 15 sec, 11/15: 17.3 sec    Time 12    Period Weeks    Status Not Met    Target Date 05/13/21      PT LONG TERM GOAL #3   Title Patient will increase Functional Gait Assessment score to >22/30 as to reduce fall risk and improve dynamic gait safety with community ambulation.    Baseline 02/18/2021= 17/30, 11/15: 14/30    Time 12    Period Weeks    Status Not Met    Target Date 05/13/21      PT LONG TERM GOAL #4   Title Pt will increase 10MWT by at least 0.13 m/s in order to demonstrate clinically significant improvement in community ambulation.    Baseline 02/18/2021= 0.89  m/s without AD, 11/15: 0.97 m/s    Time 12    Period Weeks    Status Partially Met    Target Date 05/13/21                   Plan - 04/28/21 1712     Clinical Impression Statement ?Patient demonstrated improved balance with eyes closed today although still challenging. Pt was most challenged by AP weight shifting and forward backward stepping when leading with LLE and would benefit from addressing this in future sessions. Pt will continue to benefit from skilled PT to address weakness, gait deviations, and balance to improve safety with ambulation and ADLs and decrease falls risk.  ?    Personal Factors and Comorbidities Age;Comorbidity 3+    Comorbidities TBI, Dementia, DM    Examination-Activity Limitations Carry;Other   walking   Examination-Participation Restrictions Community Activity;Yard Work    Stability/Clinical Decision Making Stable/Uncomplicated    Rehab Potential Good    PT Frequency 2x / week    PT Duration 12 weeks    PT Treatment/Interventions ADLs/Self Care Home Management;Cryotherapy;Moist Heat;DME Instruction;Gait training;Stair training;Functional mobility training;Therapeutic activities;Therapeutic exercise;Balance training;Neuromuscular re-education;Patient/family education;Manual techniques;Passive range of motion    PT Next Visit Plan Continue with progressive  balance activities for improved static and dynamic balance    PT Home Exercise Plan Access Code: [email or web address]; 04/15/2021=?Access Code: [email or web address]    FUXNATFTD and Agree with Plan of Care Patient   spouse- Mickel Baas            Patient will benefit from skilled therapeutic intervention in order to improve the following deficits and impairments:  Abnormal gait, Decreased activity tolerance, Decreased balance, Decreased  coordination, Decreased cognition, Decreased mobility, Decreased safety awareness, Decreased strength, Impaired perceived functional ability  Visit Diagnosis: Abnormality of gait and  mobility  Unsteadiness on feet  Other lack of coordination     Problem List Patient Active Problem List   Diagnosis Date Noted   Iron deficiency anemia 07/16/2019   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 07/16/2019   Serum potassium elevated 07/16/2019   High risk medications (not anticoagulants) long-term use 07/16/2019   Elevated LDL cholesterol level 06/25/2018   Healthcare maintenance 12/13/2017   Encounter for screening for malignant neoplasm of respiratory organs 07/12/2017   Overgrown toenails 07/12/2017   Vitamin D insufficiency 04/28/2017   Closed fracture of one rib of left side 04/28/2017   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 ([place]) 04/28/2017   Diabetes mellitus due to underlying condition without complication, with long-term current use of insulin ([place]) 04/06/2017   Elbow swelling, left 04/06/2017   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 I have personally read, edited and approve of the note as written.  Janna Arch, PT, DPT  04/28/2021, 5:13 PM  [place] MAIN [facility] SERVICES [address] [place], [place], [address] Phone: [phone]   Fax:  [phone]  Name: Dustin Ferrell MRN: [id number] Date of Birth: Dec 14, 1945

## 2021-05-03 ENCOUNTER — Ambulatory Visit: Payer: Medicare HMO | Attending: Internal Medicine

## 2021-05-03 ENCOUNTER — Other Ambulatory Visit: Payer: Self-pay

## 2021-05-03 DIAGNOSIS — R262 Difficulty in walking, not elsewhere classified: Secondary | ICD-10-CM | POA: Insufficient documentation

## 2021-05-03 DIAGNOSIS — R2689 Other abnormalities of gait and mobility: Secondary | ICD-10-CM | POA: Diagnosis present

## 2021-05-03 DIAGNOSIS — R2681 Unsteadiness on feet: Secondary | ICD-10-CM | POA: Insufficient documentation

## 2021-05-03 DIAGNOSIS — R278 Other lack of coordination: Secondary | ICD-10-CM | POA: Insufficient documentation

## 2021-05-03 DIAGNOSIS — R269 Unspecified abnormalities of gait and mobility: Secondary | ICD-10-CM | POA: Insufficient documentation

## 2021-05-03 DIAGNOSIS — M6281 Muscle weakness (generalized): Secondary | ICD-10-CM | POA: Diagnosis present

## 2021-05-03 NOTE — Therapy (Signed)
[place] MAIN [facility] -  SERVICES [address] [place], [place], [address] Phone: [phone]   Fax:  [phone]  Physical Therapy Treatment/ Progress Note   Dates of reporting period  04/13/21   to   05/03/21   Patient Details  Name: Dustin Ferrell MRN: [id number] Date of Birth: [birth year] Referring Provider (PT): Dr. Joselyn Arrow   Encounter Date: 05/03/2021   PT End of Session - 05/03/21 1700     Visit Number 10    Number of Visits 25    Date for PT Re-Evaluation 05/13/21    Authorization Time Period Initial Certification = 0/93/2355= 05/13/2021    PT Start Time 1604    PT Stop Time 1652    PT Time Calculation (min) 48 min    Equipment Utilized During Treatment Gait belt    Activity Tolerance Patient tolerated treatment well;Patient limited by pain    Behavior During Therapy WFL for tasks assessed/performed             Past Medical History:  Diagnosis Date   Anxiety    associated with frontal temporal behavioral dementia   Dementia with behavioral disturbance    Diabetes mellitus without complication ([place])    History of traumatic head injury    Hypertension     Past Surgical History:  Procedure Laterality Date   Eye Socket fracture repair Right    HERNIA REPAIR     ROTATOR CUFF REPAIR Left    TONSILLECTOMY     WRIST FRACTURE SURGERY Right     There were no vitals filed for this visit.   Subjective Assessment - 05/03/21 1602     Subjective Patient reports "i'm having a rough day today" in reference to LBP which has been increased for 4 days. 2/10 when sitting, 7/10 when walking. Denies radiating symptoms. Pt is accompanied by Wife.    Patient is accompained by: Family member    Pertinent History Per note submitted by Dr. Joselyn Arrow on 01/04/2021:1. 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  - Agree with wife,  patient should not be unaccompanied to due concern for fall's risk   - Home health as below     - On valproic acid/depakote for behavior as well    2. 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 Alcohol use disorder- still drinks 2-3 beers a day, sometimes 7-8 beers a day  - Discontinued alcohol use (July 2022). Encouraged efforts to continue to avoid alcohol     4. Imbalance - multifactorial (alcohol, TBI, diabetic neuropathy), now with multiple falls   - Home health referral for Physical therapy, occupational therapy, home health aide, respite care (as wife is primary caretaker, and patient should not be left unaccompanied)     - Agree with wife, patient should have electric wheelchair/assistive device to assist with mobility, especially while traveling   - Discussed fall's risk   There are a few interventions the patient should consider to prevent falls: Be careful, Avoid high heel footwear, Use appropriate assistive device, Avoid hazards in your path, (toys, pets, wires, etc.),  Have good light when you walk, Use a nonskid shower mat/shower chair, Use handrails and grab bars. Check out www.stopfalls.org for more information.    5. Obstructive sleep apnea   - Home sleep study - not completed, patient deferred due to stated noncompliance/per preference   -.    6. Type 2 diabetes mellitus- on insulin, per primary care provider     7. Tobacco use-1 ppd in the past, attempting   - Current every day smoker   - Encouraged smoking cessation     8. High ASCVD score - represents an increased risk of heart attack or stroke. Please continue to work on aggressive vascular risk factors reduction e.g. blood pressure control, no smoking, blood sugar, and cholesterol management. Consider talking  to your primary care physician if you are not on antiplatelet and statin medication.    Limitations Standing;Walking;House hold activities    How long can you sit comfortably? no restriction    How long can you stand comfortably? 15 min    How long can you walk comfortably? 10 min    Patient Stated Goals I want to improve my balance and not fall    Currently in Pain? Yes    Pain Score 4     Pain Location Back    Pain Orientation Left;Posterior;Medial    Pain Descriptors / Indicators Aching;Sharp    Pain Type Chronic pain;Acute pain    Pain Onset More than a month ago            aSLR test performed: inconclusive 2/2 patient cognition and confusion with commands.  5XSTS:  11.19 BUE, 14.55s no UE support. Pt reports increase in back pain from 2 to 7/10.   10MWT: Self selected= 0.94 m/s, Fast= 1.08 m/s   FGA: 13/30; most challenged by narrow BOS, navigating obstacles, and vertical head turns.   FOTO: 46%  Pt educated throughout session about proper posture and technique with exercises. Improved exercise technique, movement at target joints, use of target muscles after min to mod verbal, visual, tactile cues.  Patient's condition has the potential to improve in response to therapy. Maximum improvement is yet to be obtained. The anticipated improvement is attainable and reasonable in a generally predictable time. Pt demonstrated improvement towards goals in gait speed and 5XSTS, however he reports increased episodes of low back pain that are aggravated by transitional movements, rolling in bed, and turning which significantly impacts ability to perform ambulating safely. Patient will continue to benefit from skilled PT to improve balance and strength to decrease falls risk.                       PT Education - 05/03/21 1711     Education Details Goals, POC    Person(s) Educated Spouse;Patient    Methods Explanation;Demonstration;Verbal cues;Tactile cues     Comprehension Verbalized understanding;Tactile cues required;Returned demonstration;Verbal cues required              PT Short Term Goals - 05/03/21 1610       PT SHORT TERM GOAL #1   Title Pt will be independent with HEP in order to improve strength and balance in order to decrease fall risk and improve function at home and work.    Baseline 11/15: not doing HEP right now; 12/5:  doing HEP with help of spouse every day, but without spouse has difficulty with compliance.    Time 6    Period Weeks    Status On-going  Target Date 04/01/21               PT Long Term Goals - 05/03/21 1611       PT LONG TERM GOAL #1   Title Pt will improve FOTO to target score of 60 display perceived improvements in ability to complete ADL's.    Baseline 02/18/2021= 55%, 12/5: 46%    Time 12    Period Weeks    Status On-going    Target Date 05/13/21      PT LONG TERM GOAL #2   Title Pt will decrease 5TSTS by at least 3 seconds in order to demonstrate clinically significant improvement in LE strength.    Baseline 02/18/2021= 15 sec, 11/15: 17.3 sec, 12/5: 11.19s with BUE support, 14.55s no UE support    Time 12    Period Weeks    Status Partially Met    Target Date 05/13/21      PT LONG TERM GOAL #3   Title Patient will increase Functional Gait Assessment score to >22/30 as to reduce fall risk and improve dynamic gait safety with community ambulation.    Baseline 02/18/2021= 17/30, 11/15: 14/30, 12/5: 13/30    Time 12    Period Weeks    Status Not Met    Target Date 05/13/21      PT LONG TERM GOAL #4   Title Pt will increase 10MWT by at least 0.13 m/s in order to demonstrate clinically significant improvement in community ambulation.    Baseline 02/18/2021= 0.89 m/s without AD, 11/15: 0.97 m/s, 12/5: 1.34ms, no AD    Time 12    Period Weeks    Status Partially Met                   Plan - 05/03/21 1701     Clinical Impression Statement Patient's condition has the  potential to improve in response to therapy. Maximum improvement is yet to be obtained. The anticipated improvement is attainable and reasonable in a generally predictable time. Pt demonstrated improvement towards goals in gait speed and 5XSTS, however he reports increased episodes of low back pain that are aggravated by transitional movements, rolling in bed, and turning which significantly impacts ability to perform ambulating safely. Patient will continue to benefit from skilled PT to improve balance and strength to decrease falls risk.    Personal Factors and Comorbidities Age;Comorbidity 3+    Comorbidities TBI, Dementia, DM    Examination-Activity Limitations Carry;Other   walking   Examination-Participation Restrictions Community Activity;Yard Work    Stability/Clinical Decision Making Stable/Uncomplicated    Rehab Potential Good    PT Frequency 2x / week    PT Duration 12 weeks    PT Treatment/Interventions ADLs/Self Care Home Management;Cryotherapy;Moist Heat;DME Instruction;Gait training;Stair training;Functional mobility training;Therapeutic activities;Therapeutic exercise;Balance training;Neuromuscular re-education;Patient/family education;Manual techniques;Passive range of motion    PT Next Visit Plan Continue with progressive  balance activities for improved static and dynamic balance    PT Home Exercise Plan Access Code: [email or web address]; 04/15/2021=?Access Code: [email or web address]    [id number] Agree with Plan of Care Patient   spouse- LMickel Baas           Patient will benefit from skilled therapeutic intervention in order to improve the following deficits and impairments:  Abnormal gait, Decreased activity tolerance, Decreased balance, Decreased coordination, Decreased cognition, Decreased mobility, Decreased safety awareness, Decreased strength, Impaired perceived functional ability  Visit Diagnosis: Unsteadiness on feet  Other lack of coordination  Difficulty in walking, not  elsewhere  classified     Problem List Patient Active Problem List   Diagnosis Date Noted   Iron deficiency anemia 07/16/2019   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 07/16/2019   Serum potassium elevated 07/16/2019   High risk medications (not anticoagulants) long-term use 07/16/2019   Elevated LDL cholesterol level 06/25/2018   Healthcare maintenance 12/13/2017   Encounter for screening for malignant neoplasm of respiratory organs 07/12/2017   Overgrown toenails 07/12/2017   Vitamin D insufficiency 04/28/2017   Closed fracture of one rib of left side 04/28/2017   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 ([place]) 04/28/2017   Diabetes mellitus due to underlying condition without complication, with long-term current use of insulin ([place]) 04/06/2017   Elbow swelling, left 04/06/2017   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[place]) 04/06/2017   Fatigue 04/06/2017   Hypertension associated with diabetes ([place]) 04/06/2017   Screening for AAA (abdominal aortic aneurysm) 04/06/2017   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 I have personally read, edited and approve of the note as written.  Janna Arch, PT, DPT  05/03/2021, 5:22 PM  [place] MAIN [facility] SERVICES [address] [place], [place], [address] Phone: [phone]   Fax:  [phone]  Name: Dustin Ferrell MRN: [id number] Date of Birth: November 29, 1945

## 2021-05-05 ENCOUNTER — Ambulatory Visit: Payer: Medicare HMO

## 2021-05-05 ENCOUNTER — Other Ambulatory Visit: Payer: Self-pay

## 2021-05-05 DIAGNOSIS — R2681 Unsteadiness on feet: Secondary | ICD-10-CM

## 2021-05-05 DIAGNOSIS — M6281 Muscle weakness (generalized): Secondary | ICD-10-CM

## 2021-05-05 DIAGNOSIS — R2689 Other abnormalities of gait and mobility: Secondary | ICD-10-CM

## 2021-05-05 DIAGNOSIS — R278 Other lack of coordination: Secondary | ICD-10-CM

## 2021-05-05 NOTE — Therapy (Signed)
[place] MAIN [facility] SERVICES [address] [place], [place], [address] Phone: [phone]   Fax:  [phone]  Physical Therapy Treatment  Patient Details  Name: Dustin Ferrell MRN: [id number] Date of Birth: 06-18-1945 Referring Provider (PT): Dr. Joselyn Arrow   Encounter Date: 05/05/2021   PT End of Session - 05/05/21 1705     Visit Number 11    Number of Visits 25    Date for PT Re-Evaluation 05/13/21    Authorization Time Period Initial Certification = 8/85/0277= 05/13/2021    PT Start Time 1616    PT Stop Time 1701    PT Time Calculation (min) 45 min    Equipment Utilized During Treatment Gait belt    Activity Tolerance Patient tolerated treatment well;Patient limited by pain    Behavior During Therapy WFL for tasks assessed/performed             Past Medical History:  Diagnosis Date   Anxiety    associated with frontal temporal behavioral dementia   Dementia with behavioral disturbance    Diabetes mellitus without complication ([place])    History of traumatic head injury    Hypertension     Past Surgical History:  Procedure Laterality Date   Eye Socket fracture repair Right    HERNIA REPAIR     ROTATOR CUFF REPAIR Left    TONSILLECTOMY     WRIST FRACTURE SURGERY Right     There were no vitals filed for this visit.   Subjective Assessment - 05/05/21 1608     Subjective Pt rates LBP as a 2/10 currently. Pt reports heat helps the pain. Pt reports no stumbles/LOB. Pt spouse present at session. Pt spouse reports they are thinking of following up with ortho doc to address LBP (says has been going on >1 mo).    Patient is accompained by: Family member    Pertinent History Per note submitted by Dr. Joselyn Arrow on 01/04/2021:1. 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  - Agree with wife, patient should not be  unaccompanied to due concern for fall's risk   - Home health as below     - On valproic acid/depakote for behavior as well    2. 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 Alcohol use disorder- still drinks 2-3 beers a day, sometimes 7-8 beers a day  - Discontinued alcohol use (July 2022). Encouraged efforts to continue to avoid alcohol     4. Imbalance - multifactorial (alcohol, TBI, diabetic neuropathy), now with multiple falls   - Home health referral for Physical therapy, occupational therapy, home health aide, respite care (as wife is primary caretaker, and patient should not be left unaccompanied)     - Agree with wife, patient should have electric wheelchair/assistive device to assist with mobility, especially while traveling   - Discussed fall's risk   There are a few interventions the patient should consider to prevent falls: Be careful, Avoid high heel footwear, Use appropriate assistive device, Avoid hazards in your path, (toys, pets, wires, etc.), Have good light when you walk,  Use a nonskid shower mat/shower chair, Use handrails and grab bars. Check out www.stopfalls.org for more information.    5. Obstructive sleep apnea   - Home sleep study - not completed, patient deferred due to stated noncompliance/per preference   -.    6. Type 2 diabetes mellitus- on insulin, per primary care provider     7. Tobacco use-1 ppd in the past, attempting   - Current every day smoker   - Encouraged smoking cessation     8. High ASCVD score - represents an increased risk of heart attack or stroke. Please continue to work on aggressive vascular risk factors reduction e.g. blood pressure control, no smoking, blood sugar, and cholesterol management. Consider talking to your primary care  physician if you are not on antiplatelet and statin medication.    Limitations Standing;Walking;House hold activities    How long can you sit comfortably? no restriction    How long can you stand comfortably? 15 min    How long can you walk comfortably? 10 min    Patient Stated Goals I want to improve my balance and not fall    Currently in Pain? Yes    Pain Score 2     Pain Location Back    Pain Orientation Lower    Pain Onset More than a month ago            Neuro Re-Ed: at support surface, gait belt donned, and with CGA-min a throughout unless otherwise indicated   Dynamic marching on airex pad with decreasing levels of UE - pt starts with BUE support and transitions to no UE support -  x multiple reps  On airex, semi-tandem stance 2x30 sec each LE  Standing, NBOS with vertical and horizontal head turns 10x for each in each direction   Directional preference assessment - 10x thoracolumbar flexion and 10x for extension. Pt reports greater increase in sx with flexion, but does report minor increase in LBP sx with ext.   - Seated figure 4 stretch - 2x30 sec BLEs  - Seated hamstring stretch 2x30 sec BLEs; pt reports L side feels tighter than R  - lateral stepping over balance pods 1x20, then forward/backward  stepping balance pods 1x20 - performed mostly with UUE support, at times able to perform with intermittent UE support  Standing NBOS EC 3x30 sec - improves with reps and able to perform hands-free last two sets  Standing NBOS cross body reach (passing cone to PT) - cuing for horizontal head turns with cross reach for improved visual tracking -  x multiple reps   Pt educated throughout session about proper posture and technique with exercises. Improved exercise technique, movement at target joints, use of target muscles after min to mod verbal, visual, tactile cues.      PT Education - 05/05/21 1705     Education Details exercise technique, body mechanics    Person(s)  Educated Patient    Methods Explanation;Demonstration;Verbal cues    Comprehension Verbalized understanding;Returned demonstration;Verbal cues required;Need further instruction              PT Short Term Goals - 05/03/21 1610       PT SHORT TERM GOAL #1   Title Pt will be independent with HEP in order to improve strength and balance in order to decrease fall risk and improve function at home and work.    Baseline 11/15: not doing HEP right now; 12/5:  doing HEP with help of spouse every day, but  without spouse has difficulty with compliance.    Time 6    Period Weeks    Status On-going    Target Date 04/01/21               PT Long Term Goals - 05/03/21 1611       PT LONG TERM GOAL #1   Title Pt will improve FOTO to target score of 60 display perceived improvements in ability to complete ADL's.    Baseline 02/18/2021= 55%, 12/5: 46%    Time 12    Period Weeks    Status On-going    Target Date 05/13/21      PT LONG TERM GOAL #2   Title Pt will decrease 5TSTS by at least 3 seconds in order to demonstrate clinically significant improvement in LE strength.    Baseline 02/18/2021= 15 sec, 11/15: 17.3 sec, 12/5: 11.19s with BUE support, 14.55s no UE support    Time 12    Period Weeks    Status Partially Met    Target Date 05/13/21      PT LONG TERM GOAL #3   Title Patient will increase Functional Gait Assessment score to >22/30 as to reduce fall risk and improve dynamic gait safety with community ambulation.    Baseline 02/18/2021= 17/30, 11/15: 14/30, 12/5: 13/30    Time 12    Period Weeks    Status Not Met    Target Date 05/13/21      PT LONG TERM GOAL #4   Title Pt will increase 10MWT by at least 0.13 m/s in order to demonstrate clinically significant improvement in community ambulation.    Baseline 02/18/2021= 0.89 m/s without AD, 11/15: 0.97 m/s, 12/5: 1.25ms, no AD    Time 12    Period Weeks    Status Partially Met                   Plan - 05/05/21  1705     Clinical Impression Statement Session somewhat limited due to pt requiring restroom break and needing more rest breaks due to LBP. Pt did exhibit some radicular sx into LLE following LE clearance intervention (stepping over balance pods), so intervetion discontinued. Pt did report minor increase overall in LBP during session. Pt reports sitting helps, but showed some increase in sx with both spinal flexion and extension. Pt spouse reports they are planning to follow-up with ortho regarding pt's LBP. Majority of session focused on balance. Pt exhibited tendency to only turn trunk with cross-body reach. When cued to include cervical rotation to visually track cone pt exhibited initial increase in unsteadiness. Overall, pt steadiness does improve with repitition within session. The pt will continue to benefit from further skilled PT to improve balance and strength in order to decrease fall risk.    Personal Factors and Comorbidities Age;Comorbidity 3+    Comorbidities TBI, Dementia, DM    Examination-Activity Limitations Carry;Other   walking   Examination-Participation Restrictions Community Activity;Yard Work    Stability/Clinical Decision Making Stable/Uncomplicated    Rehab Potential Good    PT Frequency 2x / week    PT Duration 12 weeks    PT Treatment/Interventions ADLs/Self Care Home Management;Cryotherapy;Moist Heat;DME Instruction;Gait training;Stair training;Functional mobility training;Therapeutic activities;Therapeutic exercise;Balance training;Neuromuscular re-education;Patient/family education;Manual techniques;Passive range of motion    PT Next Visit Plan Continue with progressive  balance activities for improved static and dynamic balance; continue POC as previously indicated    PT Home Exercise Plan Access Code: [email or web address]; 04/15/2021=?Access Code: [email or web address]  no udpates    Consulted and Agree with Plan of Care Patient;Family member/caregiver   spouse- Mickel Baas   Family Member  Consulted spouse             Patient will benefit from skilled therapeutic intervention in order to improve the following deficits and impairments:  Abnormal gait, Decreased activity tolerance, Decreased balance, Decreased coordination, Decreased cognition, Decreased mobility, Decreased safety awareness, Decreased strength, Impaired perceived functional ability  Visit Diagnosis: Unsteadiness on feet  Muscle weakness (generalized)  Other abnormalities of gait and mobility  Other lack of coordination     Problem List Patient Active Problem List   Diagnosis Date Noted   Iron deficiency anemia 07/16/2019   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 07/16/2019   Serum potassium elevated 07/16/2019   High risk medications (not anticoagulants) long-term use 07/16/2019   Elevated LDL cholesterol level 06/25/2018   Healthcare maintenance 12/13/2017   Encounter for screening for malignant neoplasm of respiratory organs 07/12/2017   Overgrown toenails 07/12/2017   Vitamin D insufficiency 04/28/2017   Closed fracture of one rib of left side 04/28/2017   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 ([place]) 04/28/2017   Diabetes mellitus due to underlying condition without complication, with long-term current use of insulin ([place]) 04/06/2017   Elbow swelling, left 04/06/2017   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[place]) 04/06/2017   Fatigue 04/06/2017   Hypertension associated with diabetes ([place]) 04/06/2017   Screening for AAA (abdominal aortic aneurysm) 04/06/2017   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

## 2021-05-10 ENCOUNTER — Ambulatory Visit: Payer: Medicare HMO

## 2021-05-12 ENCOUNTER — Other Ambulatory Visit: Payer: Self-pay

## 2021-05-12 ENCOUNTER — Ambulatory Visit: Payer: Medicare HMO

## 2021-05-12 DIAGNOSIS — R2681 Unsteadiness on feet: Secondary | ICD-10-CM | POA: Diagnosis not present

## 2021-05-12 DIAGNOSIS — R269 Unspecified abnormalities of gait and mobility: Secondary | ICD-10-CM

## 2021-05-12 DIAGNOSIS — R262 Difficulty in walking, not elsewhere classified: Secondary | ICD-10-CM

## 2021-05-12 DIAGNOSIS — M6281 Muscle weakness (generalized): Secondary | ICD-10-CM

## 2021-05-13 NOTE — Therapy (Signed)
[place] MAIN [facility] SERVICES [address] [place], [place], [address] Phone: [phone]   Fax:  [phone]  Physical Therapy Treatment/Recertification dates for 05/12/2021-08/04/2021  Patient Details  Name: Dustin Ferrell MRN: [id number] Date of Birth: 03-17-1946 Referring Provider (PT): Dr. Joselyn Arrow   Encounter Date: 05/12/2021   PT End of Session - 05/12/21 1350     Visit Number 12    Number of Visits 25    Date for PT Re-Evaluation 08/04/21    Authorization Time Period Initial Certification = 0/01/3817= 29/93/7169; recert 67/89- 08/05/1015    PT Start Time 1345    PT Stop Time 1429    PT Time Calculation (min) 44 min    Equipment Utilized During Treatment Gait belt    Activity Tolerance Patient tolerated treatment well;Patient limited by pain    Behavior During Therapy WFL for tasks assessed/performed             Past Medical History:  Diagnosis Date   Anxiety    associated with frontal temporal behavioral dementia   Dementia with behavioral disturbance    Diabetes mellitus without complication ([place])    History of traumatic head injury    Hypertension     Past Surgical History:  Procedure Laterality Date   Eye Socket fracture repair Right    HERNIA REPAIR     ROTATOR CUFF REPAIR Left    TONSILLECTOMY     WRIST FRACTURE SURGERY Right     There were no vitals filed for this visit.   Subjective Assessment - 05/12/21 1033     Subjective Patient reports ongoing left buttock region pain that is aggravating and intermittent but not really improved much. Reports that his pain limits his walking. He states he is stretching as instructed and that it helps some.    Patient is accompained by: Family member    Pertinent History Per note submitted by Dr. Joselyn Arrow on 01/04/2021:1. 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  - Agree with wife, patient should not be unaccompanied to due concern for fall's risk   - Home health as below     - On valproic acid/depakote for behavior as well    2. 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 Alcohol use disorder- still drinks 2-3 beers a day, sometimes 7-8 beers a day  - Discontinued alcohol use (July 2022). Encouraged efforts to continue to avoid alcohol     4. Imbalance - multifactorial (alcohol, TBI, diabetic neuropathy), now with multiple falls   - Home health referral for Physical therapy, occupational therapy, home health aide, respite care (as wife is primary caretaker, and patient should not be left unaccompanied)     - Agree with wife, patient should have electric wheelchair/assistive device to assist with mobility, especially while traveling   - Discussed fall's risk   There are a few interventions the patient should consider to prevent falls: Be careful, Avoid high heel footwear, Use appropriate assistive device, Avoid hazards in your path, (toys, pets, wires, etc.), Have good light when you walk, Use a  nonskid shower mat/shower chair, Use handrails and grab bars. Check out www.stopfalls.org for more information.    5. Obstructive sleep apnea   - Home sleep study - not completed, patient deferred due to stated noncompliance/per preference   -.    6. Type 2 diabetes mellitus- on insulin, per primary care provider     7. Tobacco use-1 ppd in the past, attempting   - Current every day smoker   - Encouraged smoking cessation     8. High ASCVD score - represents an increased risk of heart attack or stroke. Please continue to work on aggressive vascular risk factors reduction e.g. blood pressure control, no smoking, blood sugar, and cholesterol  management. Consider talking to your primary care physician if you are not on antiplatelet and statin medication.    Limitations Standing;Walking;House hold activities    How long can you sit comfortably? no restriction    How long can you stand comfortably? 15 min    How long can you walk comfortably? 10 min    Patient Stated Goals I want to improve my balance and not fall    Currently in Pain? Yes    Pain Score 6     Pain Location Buttocks    Pain Orientation Left;Posterior    Pain Descriptors / Indicators Aching;Tightness;Sore    Pain Type Chronic pain    Pain Onset More than a month ago    Pain Frequency Intermittent    Aggravating Factors  Wores with transfers/walking and in the morning- but does usually get better as day goes by.    Pain Relieving Factors Stretching    Effect of Pain on Daily Activities Difficulty walking.                [facility] PT Assessment - 05/13/21 0001       Functional Gait  Assessment   Gait Level Surface Walks 20 ft in less than 5.5 sec, no assistive devices, good speed, no evidence for imbalance, normal gait pattern, deviates no more than 6 in outside of the 12 in walkway width.    Change in Gait Speed Able to change speed, demonstrates mild gait deviations, deviates 6-10 in outside of the 12 in walkway width, or no gait deviations, unable to achieve a major change in velocity, or uses a change in velocity, or uses an assistive device.    Gait with Horizontal Head Turns Performs head turns with moderate changes in gait velocity, slows down, deviates 10-15 in outside 12 in walkway width but recovers, can continue to walk.    Gait with Vertical Head Turns Performs task with slight change in gait velocity (eg, minor disruption to smooth gait path), deviates 6 - 10 in outside 12 in walkway width or uses assistive device    Gait and Pivot Turn Pivot turns safely within 3 sec and stops quickly with no loss of balance.    Step Over Obstacle Is able to step  over 2 stacked shoe boxes taped together (9 in total height) without changing gait speed. No evidence of imbalance.    Gait with Narrow Base of Support Ambulates less than 4 steps heel to toe or cannot perform without assistance.    Gait with Eyes Closed Walks 20 ft, slow speed, abnormal gait pattern, evidence for imbalance, deviates 10-15 in outside 12 in walkway width. Requires more than 9 sec to ambulate 20 ft.    Ambulating Backwards Walks 20 ft, uses assistive device, slower speed, mild gait deviations, deviates  6-10 in outside 12 in walkway width.    Steps Two feet to a stair, must use rail.    Total Score 18           INTERVENTIONS:   *Started initially with manual therapy as patient was reporting some Left gluteal pain - worse today with difficulty with transfers/walk.   Manual Therapy:  Supine- Performed single knee to chest, piriformis, lower trunk rotation, and figure 4 (faber)  Sidelye-STM to left piriformis area with cross friction massage then use of massage stick for 5 min.   Reassessed goals today:  -Due to some mild cognitive issues- this goal is not attainable as he requires some supervision with home program  -FOTO (was just assessed on 05/03/2021 and scored 46 so not assessed again today -will continue to work on balance to improve self perceived ability)   -5xSTS= 11.35 without UE support (GOAL MET)  -10 mWT (just assessed on 05/03/2021 so did not reassess again today due to patient complaining of increased pain) Ongoing  - FGA = 18/30 (ongoing)   Education provided throughout session via VC/TC and demonstration to facilitate movement at target joints and correct muscle activation for all testing and exercises performed.    Patient was limited initially by pain but did report feeling better after manual therapy techniques then progressed to reassessment of goals for recert visit. He did demo improvement with LE strength as seen by improved 5 x STS test as well as  improved overall balance progressing from 14/30 to 18/30 on FGA. He continues to be limited somewhat by Left gluteal pain. Added goal to focus on pain relief. Patient's condition has the potential to improve in response to therapy. Maximum improvement is yet to be obtained. The anticipated improvement is attainable and reasonable in a generally predictable time. The pt will continue to benefit from further skilled PT to improve balance and strength in order to decrease fall risk.              PT Education - 05/13/21 1037     Education Details HEP - stretching, anatomy of glueteal region; exercise technique    Person(s) Educated Patient;Spouse    Methods Explanation;Demonstration;Tactile cues;Verbal cues    Comprehension Verbalized understanding;Returned demonstration;Verbal cues required;Need further instruction;Tactile cues required              PT Short Term Goals - 05/12/21 1040       PT SHORT TERM GOAL #1   Title Pt will be independent with HEP in order to improve strength and balance in order to decrease fall risk and improve function at home and work.    Baseline 11/15: not doing HEP right now; 12/5:  doing HEP with help of spouse every day, but without spouse has difficulty with compliance. 05/13/2021- Due to some mild cognitive issues- this goal is not attainable as he requires some supervision with home program.    Time 6    Period Weeks    Status Unable to assess    Target Date 04/01/21               PT Long Term Goals - 05/12/21 1414       PT LONG TERM GOAL #1   Title Pt will improve FOTO to target score of 60 display perceived improvements in ability to complete ADL's.    Baseline 02/18/2021= 55%, 12/5: 46%    Time 12    Period Weeks    Status On-going    Target  Date 08/04/21      PT LONG TERM GOAL #2   Title Pt will decrease 5TSTS by at least 3 seconds in order to demonstrate clinically significant improvement in LE strength.    Baseline 02/18/2021=  15 sec, 11/15: 17.3 sec, 12/5: 11.19s with BUE support, 14.55s no UE support; 05/12/2021= 11:35 sec    Time 12    Period Weeks    Status Achieved    Target Date 05/13/21      PT LONG TERM GOAL #3   Title Patient will increase Functional Gait Assessment score to >22/30 as to reduce fall risk and improve dynamic gait safety with community ambulation.    Baseline 02/18/2021= 17/30, 11/15: 14/30, 12/5: 13/30; 12/14= 18/30    Time 12    Period Weeks    Status On-going    Target Date 08/04/21      PT LONG TERM GOAL #4   Title Pt will increase 10MWT by at least 0.13 m/s in order to demonstrate clinically significant improvement in community ambulation.    Baseline 02/18/2021= 0.89 m/s without AD, 11/15: 0.97 m/s, 12/5: 1.66ms, no AD    Time 12    Period Weeks    Status Partially Met    Target Date 08/04/21      PT LONG TERM GOAL #5   Title Patient will report decreased left side buttock pain to less than 4/10 with transfers/mobility for improved functional mobility in home and community.    Baseline 05/12/2021= 6/10 left gluteal pain.                   Plan - 05/12/21 1350     Clinical Impression Statement Patient was limited initially by pain but did report feeling better after manual therapy techniques then progressed to reassessment of goals for recert visit. He did demo improvement with LE strength as seen by improved 5 x STS test as well as improved overall balance progressing from 14/30 to 18/30 on FGA. He continues to be limited somewhat by Left gluteal pain. Added goal to focus on pain relief. Patient's condition has the potential to improve in response to therapy. Maximum improvement is yet to be obtained. The anticipated improvement is attainable and reasonable in a generally predictable time. The pt will continue to benefit from further skilled PT to improve balance and strength in order to decrease fall risk.    Personal Factors and Comorbidities Age;Comorbidity 3+     Comorbidities TBI, Dementia, DM    Examination-Activity Limitations Carry;Other   walking   Examination-Participation Restrictions Community Activity;Yard Work    Stability/Clinical Decision Making Stable/Uncomplicated    Rehab Potential Good    PT Frequency 2x / week    PT Duration 12 weeks    PT Treatment/Interventions ADLs/Self Care Home Management;Cryotherapy;Moist Heat;DME Instruction;Gait training;Stair training;Functional mobility training;Therapeutic activities;Therapeutic exercise;Balance training;Neuromuscular re-education;Patient/family education;Manual techniques;Passive range of motion;Dry needling;Vestibular;Canalith Repostioning;Spinal Manipulations;Ultrasound    PT Next Visit Plan Continue with progressive  balance activities for improved static and dynamic balance; continue POC as previously indicated; Continue with manual therapy for pain relief and tightness in low back/gluteal region    PT Home Exercise Plan Access Code: [email or web address]; 04/15/2021=Access Code: [email or web address]; no udpates    Consulted and Agree with Plan of Care Patient;Family member/caregiver   spouse- LMickel Baas  Family Member Consulted spouse             Patient will benefit from skilled therapeutic intervention in order to improve the following deficits and impairments:  Abnormal gait, Decreased activity tolerance, Decreased balance, Decreased coordination, Decreased cognition, Decreased mobility, Decreased safety awareness, Decreased strength, Impaired perceived functional ability  Visit Diagnosis: Abnormality of gait and mobility  Difficulty in walking, not elsewhere classified  Muscle weakness (generalized)  Unsteadiness on feet     Problem List Patient Active Problem List   Diagnosis Date Noted   Iron deficiency anemia 07/16/2019   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 07/16/2019   Serum potassium elevated 07/16/2019   High risk medications (not anticoagulants) long-term use 07/16/2019   Elevated LDL  cholesterol level 06/25/2018   Healthcare maintenance 12/13/2017   Encounter for screening for malignant neoplasm of respiratory organs 07/12/2017   Overgrown toenails 07/12/2017   Vitamin D insufficiency 04/28/2017   Closed fracture of one rib of left side 04/28/2017   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 ([place]) 04/28/2017   Diabetes mellitus due to underlying condition without complication, with long-term current use of insulin ([place]) 04/06/2017   Elbow swelling, left 04/06/2017   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[place]) 04/06/2017   Fatigue 04/06/2017   Hypertension associated with diabetes ([place]) 04/06/2017   Screening for AAA (abdominal aortic aneurysm) 04/06/2017   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

## 2021-05-17 ENCOUNTER — Ambulatory Visit: Payer: Medicare HMO

## 2021-05-17 ENCOUNTER — Other Ambulatory Visit: Payer: Self-pay

## 2021-05-17 DIAGNOSIS — R269 Unspecified abnormalities of gait and mobility: Secondary | ICD-10-CM

## 2021-05-17 DIAGNOSIS — R2681 Unsteadiness on feet: Secondary | ICD-10-CM | POA: Diagnosis not present

## 2021-05-17 DIAGNOSIS — M6281 Muscle weakness (generalized): Secondary | ICD-10-CM

## 2021-05-17 NOTE — Therapy (Signed)
[place] MAIN [facility] SERVICES [address] [place], [place], [address] Phone: [phone]   Fax:  [phone]  Physical Therapy Treatment  Patient Details  Name: Dustin Ferrell MRN: [id number] Date of Birth: 06/08/1945 Referring Provider (PT): Dr. Joselyn Arrow   Encounter Date: 05/17/2021    Past Medical History:  Diagnosis Date   Anxiety    associated with frontal temporal behavioral dementia   Dementia with behavioral disturbance    Diabetes mellitus without complication ([place])    History of traumatic head injury    Hypertension     Past Surgical History:  Procedure Laterality Date   Eye Socket fracture repair Right    HERNIA REPAIR     ROTATOR CUFF REPAIR Left    TONSILLECTOMY     WRIST FRACTURE SURGERY Right     There were no vitals filed for this visit.   Subjective Assessment - 05/17/21 1602     Subjective Patient reports ongoing left buttock region pain that is aggravating and intermittent but not really improved much. Reports that his pain limits his walking. He states he is stretching as instructed and that it helps some.    Patient is accompained by: Family member    Pertinent History Per note submitted by Dr. Joselyn Arrow on 01/04/2021:1. 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  - Agree with wife, patient should not be unaccompanied to due concern for fall's risk   - Home health as below     - On valproic acid/depakote for behavior as well    2. 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 Alcohol use disorder- still drinks 2-3 beers a day, sometimes 7-8 beers a day  - Discontinued  alcohol use (July 2022). Encouraged efforts to continue to avoid alcohol     4. Imbalance - multifactorial (alcohol, TBI, diabetic neuropathy), now with multiple falls   - Home health referral for Physical therapy, occupational therapy, home health aide, respite care (as wife is primary caretaker, and patient should not be left unaccompanied)     - Agree with wife, patient should have electric wheelchair/assistive device to assist with mobility, especially while traveling   - Discussed fall's risk   There are a few interventions the patient should consider to prevent falls: Be careful, Avoid high heel footwear, Use appropriate assistive device, Avoid hazards in your path, (toys, pets, wires, etc.), Have good light when you walk, Use a nonskid shower mat/shower chair, Use handrails and grab bars. Check out www.stopfalls.org for more information.    5. Obstructive sleep apnea   - Home sleep study - not completed, patient deferred due to stated noncompliance/per preference   -.    6. Type 2 diabetes mellitus- on insulin, per primary care provider     7. Tobacco use-1 ppd in the past, attempting   - Current every day smoker   - Encouraged smoking cessation     8. High ASCVD score - represents an increased risk of heart attack or stroke. Please continue to work on aggressive vascular risk factors reduction e.g. blood pressure  control, no smoking, blood sugar, and cholesterol management. Consider talking to your primary care physician if you are not on antiplatelet and statin medication.    Limitations Standing;Walking;House hold activities    How long can you sit comfortably? no restriction    How long can you stand comfortably? 15 min    How long can you walk comfortably? 10 min    Patient Stated Goals I want to improve my balance and not fall    Pain Onset More than a month ago              Manual Therapy:  Supine- Performed single knee to chest, piriformis, and figure 4 (faber) stretching x30sec each  LE Passive hamstring lengthening on SPT shoulder x60sec; difficult for patient to relax; causing increase in tremors and hamstring tone. S/L-STM to left piriformis area with cross friction massage then use of massage stick for 5 min.  Neuro Re-Ed: Gait with changing speed 6x86f Gait with cuing for widened BOS 1x866fBidirectional hedgehog taps with SUE support; forwards and posterior-lateral x12 each direction on each LE for balance, motor control, and coordination. Step back/forth over hedgehogs with SUE at support surface focusing on wide BOS and coordination. X15 with each LE leading Airex static stance, no UE support:  - Horizontal head turns 2x45sec  - Vertical head nods 2x45sec Educated on/practiced log roll technique for bed mobility to prevent low back/hip pain.  Pt educated throughout session about proper posture and technique with exercises. Improved exercise technique, movement at target joints, use of target muscles after min to mod verbal, visual, tactile cues.  Patient with good effort throughout PT today; demonstrated improved gait mechanics and balance with changing speeds as well as static balance with head turns. Patient continues to present with increased tremor as fatigue or pain increases and mild impulsivity. Will continue to benefit from skilled PT to address gait deviations, strength, and balance to decrease falls risk and improve safety with functional mobility.                           PT Short Term Goals - 05/12/21 1040       PT SHORT TERM GOAL #1   Title Pt will be independent with HEP in order to improve strength and balance in order to decrease fall risk and improve function at home and work.    Baseline 11/15: not doing HEP right now; 12/5:  doing HEP with help of spouse every day, but without spouse has difficulty with compliance. 05/13/2021- Due to some mild cognitive issues- this goal is not attainable as he requires some supervision  with home program.    Time 6    Period Weeks    Status Unable to assess    Target Date 04/01/21               PT Long Term Goals - 05/12/21 1414       PT LONG TERM GOAL #1   Title Pt will improve FOTO to target score of 60 display perceived improvements in ability to complete ADL's.    Baseline 02/18/2021= 55%, 12/5: 46%    Time 12    Period Weeks    Status On-going    Target Date 08/04/21      PT LONG TERM GOAL #2   Title Pt will decrease 5TSTS by at least 3 seconds in order to demonstrate clinically significant improvement in LE strength.    Baseline  02/18/2021= 15 sec, 11/15: 17.3 sec, 12/5: 11.19s with BUE support, 14.55s no UE support; 05/12/2021= 11:35 sec    Time 12    Period Weeks    Status Achieved    Target Date 05/13/21      PT LONG TERM GOAL #3   Title Patient will increase Functional Gait Assessment score to >22/30 as to reduce fall risk and improve dynamic gait safety with community ambulation.    Baseline 02/18/2021= 17/30, 11/15: 14/30, 12/5: 13/30; 12/14= 18/30    Time 12    Period Weeks    Status On-going    Target Date 08/04/21      PT LONG TERM GOAL #4   Title Pt will increase 10MWT by at least 0.13 m/s in order to demonstrate clinically significant improvement in community ambulation.    Baseline 02/18/2021= 0.89 m/s without AD, 11/15: 0.97 m/s, 12/5: 1.58ms, no AD    Time 12    Period Weeks    Status Partially Met    Target Date 08/04/21      PT LONG TERM GOAL #5   Title Patient will report decreased left side buttock pain to less than 4/10 with transfers/mobility for improved functional mobility in home and community.    Baseline 05/12/2021= 6/10 left gluteal pain.                    Patient will benefit from skilled therapeutic intervention in order to improve the following deficits and impairments:     Visit Diagnosis: No diagnosis found.     Problem List Patient Active Problem List   Diagnosis Date Noted   Iron  deficiency anemia 07/16/2019   Adrenal nodule ([place]- left 07/16/2019   Serum potassium elevated 07/16/2019   High risk medications (not anticoagulants) long-term use 07/16/2019   Elevated LDL cholesterol level 06/25/2018   Healthcare maintenance 12/13/2017   Encounter for screening for malignant neoplasm of respiratory organs 07/12/2017   Overgrown toenails 07/12/2017   Vitamin D insufficiency 04/28/2017   Closed fracture of one rib of left side 04/28/2017   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 ([place] 04/28/2017   Diabetes mellitus due to underlying condition without complication, with long-term [place] use of insulin ([place] 04/06/2017   Elbow swelling, left 04/06/2017   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[place] 04/06/2017   Fatigue 04/06/2017   Hypertension associated with diabetes ([place] 04/06/2017   Screening for AAA (abdominal aortic aneurysm) 04/06/2017   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 I have personally read, edited and approve of the note as written.  MJanna Arch PT, DPT  05/17/2021, 4:02 PM  [place] [facility] [address] [address]Phone: [phone]  Fax:  [phone] Name: Dustin NAWABIMRN: [id number] of Birth: 9Sep 02, 1947

## 2021-05-19 ENCOUNTER — Ambulatory Visit: Payer: Medicare HMO

## 2021-05-19 ENCOUNTER — Other Ambulatory Visit: Payer: Self-pay

## 2021-05-19 DIAGNOSIS — R2681 Unsteadiness on feet: Secondary | ICD-10-CM | POA: Diagnosis not present

## 2021-05-19 DIAGNOSIS — R269 Unspecified abnormalities of gait and mobility: Secondary | ICD-10-CM

## 2021-05-19 DIAGNOSIS — R278 Other lack of coordination: Secondary | ICD-10-CM

## 2021-05-19 NOTE — Therapy (Signed)
[place] MAIN [facility] SERVICES [address] [place], [place], [address] Phone: [phone]   Fax:  [phone]  Physical Therapy Treatment  Patient Details  Name: Dustin Ferrell MRN: [id number] Date of Birth: Jan 19, 1946 Referring Provider (PT): Dr. Joselyn Arrow   Encounter Date: 05/19/2021   PT End of Session - 05/19/21 1616     Visit Number 14    Number of Visits 25    Date for PT Re-Evaluation 08/04/21    Authorization Time Period Initial Certification = 01/26/9406= 68/12/8108; recert 31/59- 09/02/8590    PT Start Time 1604    PT Stop Time 1645    PT Time Calculation (min) 41 min    Equipment Utilized During Treatment Gait belt    Activity Tolerance Patient tolerated treatment well    Behavior During Therapy WFL for tasks assessed/performed             Past Medical History:  Diagnosis Date   Anxiety    associated with frontal temporal behavioral dementia   Dementia with behavioral disturbance    Diabetes mellitus without complication ([place])    History of traumatic head injury    Hypertension     Past Surgical History:  Procedure Laterality Date   Eye Socket fracture repair Right    HERNIA REPAIR     ROTATOR CUFF REPAIR Left    TONSILLECTOMY     WRIST FRACTURE SURGERY Right     There were no vitals filed for this visit.   Subjective Assessment - 05/19/21 1518     Subjective Patient says "today i feel pretty good, not much back pain today". Denies falls since last session.    Patient is accompained by: Family member    Pertinent History Per note submitted by Dr. Joselyn Arrow on 01/04/2021:1. 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  - Agree with wife, patient should not be unaccompanied to due concern for fall's risk   - Home health as below     - On valproic acid/depakote for behavior as well    2. 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 Alcohol use disorder- still drinks 2-3 beers a day, sometimes 7-8 beers a day  - Discontinued alcohol use (July 2022). Encouraged efforts to continue to avoid alcohol     4. Imbalance - multifactorial (alcohol, TBI, diabetic neuropathy), now with multiple falls   - Home health referral for Physical therapy, occupational therapy, home health aide, respite care (as wife is primary caretaker, and patient should not be left unaccompanied)     - Agree with wife, patient should have electric wheelchair/assistive device to assist with mobility, especially while traveling   - Discussed fall's risk   There are a few interventions the patient should consider to prevent falls: Be careful, Avoid high heel footwear, Use appropriate assistive device, Avoid hazards in your path, (toys, pets, wires, etc.), Have good light when you walk, Use a nonskid shower mat/shower chair, Use handrails and grab bars. Check out www.stopfalls.org for more information.    5. Obstructive sleep apnea   -  Home sleep study - not completed, patient deferred due to stated noncompliance/per preference   -.    6. Type 2 diabetes mellitus- on insulin, per primary care provider     7. Tobacco use-1 ppd in the past, attempting   - Current every day smoker   - Encouraged smoking cessation     8. High ASCVD score - represents an increased risk of heart attack or stroke. Please continue to work on aggressive vascular risk factors reduction e.g. blood pressure control, no smoking, blood sugar, and cholesterol management. Consider talking to your primary care physician if you are not on antiplatelet and statin medication.    Limitations Standing;Walking;House hold activities    How long can you sit  comfortably? no restriction    How long can you stand comfortably? 15 min    How long can you walk comfortably? 10 min    Patient Stated Goals I want to improve my balance and not fall    Currently in Pain? Yes    Pain Score 2     Pain Location Back    Pain Orientation Left    Pain Descriptors / Indicators Aching    Pain Type Chronic pain    Pain Onset More than a month ago             Treatment:  Obstacle course in hallway x65f/ lap, close CGA and gait belt:  Weaving around hedgehogs, quick stop, step on airex, step on 6" step, step down x 8 laps  Patient demo'd significant improvement; minimal LOB noted when leading step with left leg, but pt was able to self correct.   On Airex at support surface:   Head turns horizontal and vertical 3x45sec each direction   Modified tandem stance 2x60sec each LE leading.   SL stance x8sec holds x12 each LE, SUE support   Marching with SUE support, cuing for wide BOS x15 each LE  Modified split stance; Darts x1 each LE leading Static stance on airex, Darts x1 Multidirectional Hedgehog taps with SUE support, forward/ posterior-lateral x10 each direction each LE Seated thoracic extensions x15, tactile cuing required for form Seated thoracic rotations x8 each direction  Seated hamstring stretch 4x30sec each LE throughout session.  Pt educated throughout session about proper posture and technique with exercises. Improved exercise technique, movement at target joints, use of target muscles after min to mod verbal, visual, tactile cues.   Patient presents to PT with reduced complaints of low back/hip pain and demonstrated significant functional improvements with Neuro Re-Ed throughout session. Balance, coordination, and motor control were challenged through progressed obstacle course and airex balance training and were tolerated well by patient. Areas of deficits continue to include leading with left leg, uneven surfaces, gait deviations,  posture, and narrow BOS balance. Patient will continue to benefit from skilled PT to address these deficits and improve safety and independence with ambulation and functional mobility.                           PT Education - 05/19/21 1615     Education Details exercise technique, gait mechanics    Person(s) Educated Patient;Spouse    Methods Explanation;Demonstration;Tactile cues;Verbal cues    Comprehension Verbalized understanding;Returned demonstration;Verbal cues required;Tactile cues required              PT Short Term Goals - 05/12/21 1040       PT SHORT TERM GOAL #1   Title Pt  will be independent with HEP in order to improve strength and balance in order to decrease fall risk and improve function at home and work.    Baseline 11/15: not doing HEP right now; 12/5:  doing HEP with help of spouse every day, but without spouse has difficulty with compliance. 05/13/2021- Due to some mild cognitive issues- this goal is not attainable as he requires some supervision with home program.    Time 6    Period Weeks    Status Unable to assess    Target Date 04/01/21               PT Long Term Goals - 05/12/21 1414       PT LONG TERM GOAL #1   Title Pt will improve FOTO to target score of 60 display perceived improvements in ability to complete ADL's.    Baseline 02/18/2021= 55%, 12/5: 46%    Time 12    Period Weeks    Status On-going    Target Date 08/04/21      PT LONG TERM GOAL #2   Title Pt will decrease 5TSTS by at least 3 seconds in order to demonstrate clinically significant improvement in LE strength.    Baseline 02/18/2021= 15 sec, 11/15: 17.3 sec, 12/5: 11.19s with BUE support, 14.55s no UE support; 05/12/2021= 11:35 sec    Time 12    Period Weeks    Status Achieved    Target Date 05/13/21      PT LONG TERM GOAL #3   Title Patient will increase Functional Gait Assessment score to >22/30 as to reduce fall risk and improve dynamic gait  safety with community ambulation.    Baseline 02/18/2021= 17/30, 11/15: 14/30, 12/5: 13/30; 12/14= 18/30    Time 12    Period Weeks    Status On-going    Target Date 08/04/21      PT LONG TERM GOAL #4   Title Pt will increase 10MWT by at least 0.13 m/s in order to demonstrate clinically significant improvement in community ambulation.    Baseline 02/18/2021= 0.89 m/s without AD, 11/15: 0.97 m/s, 12/5: 1.50ms, no AD    Time 12    Period Weeks    Status Partially Met    Target Date 08/04/21      PT LONG TERM GOAL #5   Title Patient will report decreased left side buttock pain to less than 4/10 with transfers/mobility for improved functional mobility in home and community.    Baseline 05/12/2021= 6/10 left gluteal pain.                   Plan - 05/19/21 1616     Clinical Impression Statement Patient presents to PT with reduced complaints of low back/hip pain and demonstrated significant functional improvements with Neuro Re-Ed throughout session. Balance, coordination, and motor control were challenged through progressed obstacle course and airex balance training and were tolerated well by patient. Areas of deficits continue to include leading with left leg, uneven surfaces, gait deviations, posture, and narrow BOS balance. Patient will continue to benefit from skilled PT to address these deficits and improve safety and independence with ambulation and functional mobility.    Personal Factors and Comorbidities Age;Comorbidity 3+    Comorbidities TBI, Dementia, DM    Examination-Activity Limitations Carry;Other   walking   Examination-Participation Restrictions Community Activity;Yard Work    Stability/Clinical Decision Making Stable/Uncomplicated    Rehab Potential Good    PT Frequency 2x / week  PT Duration 12 weeks    PT Treatment/Interventions ADLs/Self Care Home Management;Cryotherapy;Moist Heat;DME Instruction;Gait training;Stair training;Functional mobility  training;Therapeutic activities;Therapeutic exercise;Balance training;Neuromuscular re-education;Patient/family education;Manual techniques;Passive range of motion;Dry needling;Vestibular;Canalith Repostioning;Spinal Manipulations;Ultrasound    PT Next Visit Plan Continue with progressive  balance activities for improved static and dynamic balance; continue POC as previously indicated; Continue with manual therapy for pain relief and tightness in low back/gluteal region    PT Home Exercise Plan Access Code: [email or web address]; 04/15/2021=Access Code: [email or web address]; no udpates    Consulted and Agree with Plan of Care Patient;Family member/caregiver   spouse- Mickel Baas   Family Member Consulted spouse             Patient will benefit from skilled therapeutic intervention in order to improve the following deficits and impairments:  Abnormal gait, Decreased activity tolerance, Decreased balance, Decreased coordination, Decreased cognition, Decreased mobility, Decreased safety awareness, Decreased strength, Impaired perceived functional ability  Visit Diagnosis: Unsteadiness on feet  Abnormality of gait and mobility  Other lack of coordination     Problem List Patient Active Problem List   Diagnosis Date Noted   Iron deficiency anemia 07/16/2019   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 07/16/2019   Serum potassium elevated 07/16/2019   High risk medications (not anticoagulants) long-term use 07/16/2019   Elevated LDL cholesterol level 06/25/2018   Healthcare maintenance 12/13/2017   Encounter for screening for malignant neoplasm of respiratory organs 07/12/2017   Overgrown toenails 07/12/2017   Vitamin D insufficiency 04/28/2017   Closed fracture of one rib of left side 04/28/2017   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 ([place]) 04/28/2017   Diabetes mellitus due to underlying condition without complication, with long-term current use of insulin ([place]) 04/06/2017   Elbow swelling, left  04/06/2017   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[place]) 04/06/2017   Fatigue 04/06/2017   Hypertension associated with diabetes ([place]) 04/06/2017   Screening for AAA (abdominal aortic aneurysm) 04/06/2017   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 I have personally read, edited and approve of the note as written.  Janna Arch, PT, DPT  05/19/2021, 4:21 PM  [place] MAIN [facility] SERVICES [address] [place], [place], [address] Phone: [phone]   Fax:  [phone]  Name: FAROUK VIVERO MRN: [id number] Date of Birth: [birth year]

## 2021-05-26 ENCOUNTER — Ambulatory Visit: Payer: Medicare HMO | Admitting: Physical Therapy

## 2021-06-02 ENCOUNTER — Other Ambulatory Visit: Payer: Self-pay

## 2021-06-02 ENCOUNTER — Ambulatory Visit: Payer: Medicare HMO | Attending: Neurology

## 2021-06-02 DIAGNOSIS — R278 Other lack of coordination: Secondary | ICD-10-CM | POA: Diagnosis present

## 2021-06-02 DIAGNOSIS — R2681 Unsteadiness on feet: Secondary | ICD-10-CM | POA: Insufficient documentation

## 2021-06-02 DIAGNOSIS — R269 Unspecified abnormalities of gait and mobility: Secondary | ICD-10-CM | POA: Diagnosis present

## 2021-06-02 DIAGNOSIS — R2689 Other abnormalities of gait and mobility: Secondary | ICD-10-CM | POA: Diagnosis present

## 2021-06-02 DIAGNOSIS — R262 Difficulty in walking, not elsewhere classified: Secondary | ICD-10-CM | POA: Insufficient documentation

## 2021-06-02 DIAGNOSIS — M6281 Muscle weakness (generalized): Secondary | ICD-10-CM | POA: Diagnosis present

## 2021-06-02 NOTE — Therapy (Signed)
[facility] SERVICES [place], [place], [address] Phone: [phone]   Fax:  [phone]  Physical Therapy Treatment  Patient Details  Name: Dustin Ferrell MRN: [id number] Date of Birth: 02-11-1946 Referring Provider (PT): Dr. Joselyn Arrow   Encounter Date: 06/02/2021   PT End of Session - 06/02/21 0805     Visit Number 15    Number of Visits 25    Date for PT Re-Evaluation 08/04/21    Authorization Time Period Initial Certification = 7/00/1749= 44/96/7591; recert 63/84- 11/03/5991    PT Start Time 0800    PT Stop Time 0844    PT Time Calculation (min) 44 min    Equipment Utilized During Treatment Gait belt    Activity Tolerance Patient tolerated treatment well    Behavior During Therapy WFL for tasks assessed/performed             Past Medical History:  Diagnosis Date   Anxiety    associated with frontal temporal behavioral dementia   Dementia with behavioral disturbance    Diabetes mellitus without complication ([place])    History of traumatic head injury    Hypertension     Past Surgical History:  Procedure Laterality Date   Eye Socket fracture repair Right    HERNIA REPAIR     ROTATOR CUFF REPAIR Left    TONSILLECTOMY     WRIST FRACTURE SURGERY Right     There were no vitals filed for this visit.   Subjective Assessment - 06/02/21 0804     Subjective Patient presents without his wife. Patient tripped on a rug since his last visit, a week ago.    Patient is accompained by: Family member    Pertinent History Per note submitted by Dr. Joselyn Arrow on 01/04/2021:1. 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  - Agree with wife, patient should not be unaccompanied to due concern for fall's risk   - Home health as below     - On valproic acid/depakote for behavior as well    2. 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 Alcohol use disorder- still drinks 2-3 beers a day, sometimes 7-8 beers a day  - Discontinued alcohol use (July 2022). Encouraged efforts to continue to avoid alcohol     4. Imbalance - multifactorial (alcohol, TBI, diabetic neuropathy), now with multiple falls   - Home health referral for Physical therapy, occupational therapy, home health aide, respite care (as wife is primary caretaker, and patient should not be left unaccompanied)     - Agree with wife, patient should have electric wheelchair/assistive device to assist with mobility, especially while traveling   - Discussed fall's risk   There are a few interventions the patient should consider to prevent falls: Be careful, Avoid high heel footwear, Use appropriate assistive device, Avoid hazards in your path, (toys, pets, wires, etc.), Have good light when you walk, Use a nonskid shower mat/shower chair, Use handrails and grab bars. Check out www.stopfalls.org for more information.    5. Obstructive sleep apnea   -  Home sleep study - not completed, patient deferred due to stated noncompliance/per preference   -.    6. Type 2 diabetes mellitus- on insulin, per primary care provider     7. Tobacco use-1 ppd in the past, attempting   - Current every day smoker   - Encouraged smoking cessation     8. High ASCVD score - represents an increased risk of heart attack or stroke. Please continue to work on aggressive vascular risk factors reduction e.g. blood pressure control, no smoking, blood sugar, and cholesterol management. Consider talking to your primary care physician if you are not on antiplatelet and statin medication.   ° Limitations Standing;Walking;House hold activities   ° How long can you sit comfortably?  no restriction   ° How long can you stand comfortably? 15 min   ° How long can you walk comfortably? 10 min   ° Patient Stated Goals I want to improve my balance and not fall   ° Currently in Pain? Yes   ° Pain Score 3    ° Pain Location Back   ° Pain Orientation Left   ° Pain Descriptors / Indicators Aching   ° Pain Type Chronic pain   ° Pain Onset More than a month ago   ° Pain Frequency Intermittent   ° °  °  ° °  ° ° ° ° ° ° °Patient presents without his wife. Patient tripped on a rug since his last visit, a week ago.  ° ° ° °Treatment: ° airx pad: reaching across midline to outside BOS to reach letters to play word game on magnet board x 5 minutes ° °Airex pad modified tandem stance on 6" step 30 seconds each LE placement ° °Lateral step over orange hurdle 10x each direction no UE support, patient reports as medium °Single limb stance with BUE support 30 seconds each LE °Standing 6" step toe taps 10x each LE ° °ambulate in hallway: occasional scissoring with head turns  °-horizontal head turns with cues for reading alphabet from cards 86 ftx 2 sets °-horizontal head turns with cues for reading number and symbols from cards 86 ft x 2 sets  °-“red light/green light” for sudden initiation/termination of ambulation with close CGA for carryover to natural environment 2x 86 ft  ° °  °Sit to stand weighted ball overhead press (2000 gr) ° °Seated: overhead hold weighted ball (2000 gr)with march 10x  °Seated thoracic extensions x15,  °Large swiss ball forward trunk rollouts 10x 10 second holds  °tactile cuing required for hamstring stretch 4x30sec each LE throughout session. °  °Pt educated throughout session about proper posture and technique with exercises. Improved exercise technique, movement at target joints, use of target muscles after min to mod verbal, visual, tactile cues ° ° ° ° ° °Patient is highly motivated throughout physical therapy session. He is challenged with dual task but improves with repetition.  Tandem stance is very challenging and will continue to be an area of improvement. Head turns with ambulation result in frequent scissoring. Trunk extensions reduce pain. Patient will continue to benefit from skilled PT to address these deficits and improve safety and independence with ambulation and functional mobility ° ° ° ° ° ° ° ° ° ° ° ° PT Education - 06/02/21 0804   ° ° Education Details exercise technique, body mechanics   ° Person(s) Educated Patient   ° Methods Explanation;Demonstration;Tactile cues;Verbal cues   ° Comprehension Verbalized understanding;Returned demonstration;Verbal cues required;Tactile cues required   ° °  °  ° °  ° ° °   PT Short Term Goals - 05/12/21 1040   ° °  ° PT SHORT TERM GOAL #1  ° Title Pt will be independent with HEP in order to improve strength and balance in order to decrease fall risk and improve function at home and work.   ° Baseline 11/15: not doing HEP right now; 12/5:  doing HEP with help of spouse every day, but without spouse has difficulty with compliance. 05/13/2021- Due to some mild cognitive issues- this goal is not attainable as he requires some supervision with home program.   ° Time 6   ° Period Weeks   ° Status Unable to assess   ° Target Date 04/01/21   ° °  °  ° °  ° ° ° ° PT Long Term Goals - 05/12/21 1414   ° °  ° PT LONG TERM GOAL #1  ° Title Pt will improve FOTO to target score of 60 display perceived improvements in ability to complete ADL's.   ° Baseline 02/18/2021= 55%, 12/5: 46%   ° Time 12   ° Period Weeks   ° Status On-going   ° Target Date 08/04/21   °  ° PT LONG TERM GOAL #2  ° Title Pt will decrease 5TSTS by at least 3 seconds in order to demonstrate clinically significant improvement in LE strength.   ° Baseline 02/18/2021= 15 sec, 11/15: 17.3 sec, 12/5: 11.19s with BUE support, 14.55s no UE support; 05/12/2021= 11:35 sec   ° Time 12   ° Period Weeks   ° Status Achieved   ° Target Date 05/13/21   °  ° PT LONG TERM GOAL #3  ° Title Patient will  increase Functional Gait Assessment score to >22/30 as to reduce fall risk and improve dynamic gait safety with community ambulation.   ° Baseline 02/18/2021= 17/30, 11/15: 14/30, 12/5: 13/30; 12/14= 18/30   ° Time 12   ° Period Weeks   ° Status On-going   ° Target Date 08/04/21   °  ° PT LONG TERM GOAL #4  ° Title Pt will increase 10MWT by at least 0.13 m/s in order to demonstrate clinically significant improvement in community ambulation.   ° Baseline 02/18/2021= 0.89 m/s without AD, 11/15: 0.97 m/s, 12/5: 1.08m/s, no AD   ° Time 12   ° Period Weeks   ° Status Partially Met   ° Target Date 08/04/21   °  ° PT LONG TERM GOAL #5  ° Title Patient will report decreased left side buttock pain to less than 4/10 with transfers/mobility for improved functional mobility in home and community.   ° Baseline 05/12/2021= 6/10 left gluteal pain.   ° °  °  ° °  ° ° ° ° ° ° ° ° Plan - 06/02/21 0836   ° ° Clinical Impression Statement Patient is highly motivated throughout physical therapy session. He is challenged with dual task but improves with repetition. Tandem stance is very challenging and will continue to be an area of improvement. Head turns with ambulation result in frequent scissoring. Trunk extensions reduce pain. Patient will continue to benefit from skilled PT to address these deficits and improve safety and independence with ambulation and functional mobility   ° Personal Factors and Comorbidities Age;Comorbidity 3+   ° Comorbidities TBI, Dementia, DM   ° Examination-Activity Limitations Carry;Other   walking  ° Examination-Participation Restrictions Community Activity;Yard Work   ° Stability/Clinical Decision Making Stable/Uncomplicated   ° Rehab Potential Good   ° PT   Frequency 2x / week   ° PT Duration 12 weeks   ° PT Treatment/Interventions ADLs/Self Care Home Management;Cryotherapy;Moist Heat;DME Instruction;Gait training;Stair training;Functional mobility training;Therapeutic activities;Therapeutic  exercise;Balance training;Neuromuscular re-education;Patient/family education;Manual techniques;Passive range of motion;Dry needling;Vestibular;Canalith Repostioning;Spinal Manipulations;Ultrasound   ° PT Next Visit Plan Continue with progressive  balance activities for improved static and dynamic balance; continue POC as previously indicated; Continue with manual therapy for pain relief and tightness in low back/gluteal region   ° PT Home Exercise Plan Access Code: [email or web address]; 04/15/2021=Access Code: [email or web address]; no udpates   ° Consulted and Agree with Plan of Care Patient;Family member/caregiver   spouse- Laura  ° Family Member Consulted spouse   ° °  °  ° °  ° ° °Patient will benefit from skilled therapeutic intervention in order to improve the following deficits and impairments:  Abnormal gait, Decreased activity tolerance, Decreased balance, Decreased coordination, Decreased cognition, Decreased mobility, Decreased safety awareness, Decreased strength, Impaired perceived functional ability ° °Visit Diagnosis: °Unsteadiness on feet ° °Abnormality of gait and mobility ° °Muscle weakness (generalized) ° ° ° ° °Problem List °Patient Active Problem List  ° Diagnosis Date Noted  ° Iron deficiency anemia 07/16/2019  ° Adrenal nodule (HCC)- left 07/16/2019  ° Serum potassium elevated 07/16/2019  ° High risk medications (not anticoagulants) long-term use 07/16/2019  ° Elevated LDL cholesterol level 06/25/2018  ° Healthcare maintenance 12/13/2017  ° Encounter for screening for malignant neoplasm of respiratory organs 07/12/2017  ° Overgrown toenails 07/12/2017  ° Vitamin D insufficiency 04/28/2017  ° Closed fracture of one rib of left side 04/28/2017  ° 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) 04/28/2017  ° Diabetes mellitus due to underlying condition without complication, with long-term current use of insulin (HCC) 04/06/2017  ° Elbow swelling, left 04/06/2017  ° 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HCC)  04/06/2017  ° Fatigue 04/06/2017  ° Hypertension associated with diabetes (HCC) 04/06/2017  ° Screening for AAA (abdominal aortic aneurysm) 04/06/2017  ° 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

## 2021-06-03 ENCOUNTER — Ambulatory Visit: Payer: Medicare HMO | Admitting: Dermatology

## 2021-06-03 DIAGNOSIS — C44311 Basal cell carcinoma of skin of nose: Secondary | ICD-10-CM | POA: Diagnosis not present

## 2021-06-03 DIAGNOSIS — D485 Neoplasm of uncertain behavior of skin: Secondary | ICD-10-CM

## 2021-06-03 DIAGNOSIS — L578 Other skin changes due to chronic exposure to nonionizing radiation: Secondary | ICD-10-CM

## 2021-06-03 DIAGNOSIS — Z85828 Personal history of other malignant neoplasm of skin: Secondary | ICD-10-CM

## 2021-06-03 DIAGNOSIS — L814 Other melanin hyperpigmentation: Secondary | ICD-10-CM

## 2021-06-03 DIAGNOSIS — L821 Other seborrheic keratosis: Secondary | ICD-10-CM

## 2021-06-03 DIAGNOSIS — Z1283 Encounter for screening for malignant neoplasm of skin: Secondary | ICD-10-CM

## 2021-06-03 DIAGNOSIS — C4491 Basal cell carcinoma of skin, unspecified: Secondary | ICD-10-CM

## 2021-06-03 HISTORY — DX: Basal cell carcinoma of skin, unspecified: C44.91

## 2021-06-03 NOTE — Progress Notes (Signed)
° °  New Patient Visit  Subjective  Dustin Ferrell is a 76 y.o. male who presents for the following: Skin Problem (New patient here today for a spot at nose, comes and goes. It gets dark, sometimes bleeds. Patient does have a hx skin cancer, removed at least 30 years ago. ). The patient presents for Upper Body Skin Exam (UBSE) for skin cancer screening and mole check.  The patient has spots, moles and lesions to be evaluated, some may be new or changing and the patient has concerns that these could be cancer.  Patient accompanied by wife who contributes to history.   The following portions of the chart were reviewed this encounter and updated as appropriate:   Tobacco   Allergies   Meds   Problems   Med Hx   Surg Hx   Fam Hx      Review of Systems:  No other skin or systemic complaints except as noted in HPI or Assessment and Plan.  Objective  Well appearing patient in no apparent distress; mood and affect are within normal limits.  All skin waist up examined.  left nasal ala Crusted papule 0.6 cm      Assessment & Plan  Neoplasm of uncertain behavior of skin left nasal ala  Skin / nail biopsy Type of biopsy: tangential   Informed consent: discussed and consent obtained   Timeout: patient name, date of birth, surgical site, and procedure verified   Procedure prep:  Patient was prepped and draped in usual sterile fashion Prep type:  Isopropyl alcohol Anesthesia: the lesion was anesthetized in a standard fashion   Anesthetic:  1% lidocaine w/ epinephrine 1-100,000 buffered w/ 8.4% NaHCO3 Instrument used: flexible razor blade   Hemostasis achieved with: pressure, aluminum chloride and electrodesiccation   Outcome: patient tolerated procedure well   Post-procedure details: sterile dressing applied and wound care instructions given   Dressing type: petrolatum and bandage    Specimen 1 - Surgical pathology Differential Diagnosis: Prurigo Nodule vs SCC vs BCC  Check Margins:  No Crusted papule 0.6 cm  Seborrheic Keratoses - Stuck-on, waxy, tan-brown papules and/or plaques  - Benign-appearing - Discussed benign etiology and prognosis. - Observe - Call for any changes  Actinic Damage - chronic, secondary to cumulative UV radiation exposure/sun exposure over time - diffuse scaly erythematous macules with underlying dyspigmentation - Recommend daily broad spectrum sunscreen SPF 30+ to sun-exposed areas, reapply every 2 hours as needed.  - Recommend staying in the shade or wearing long sleeves, sun glasses (UVA+UVB protection) and wide brim hats (4-inch brim around the entire circumference of the hat). - Call for new or changing lesions.  Lentigines - Scattered tan macules - Due to sun exposure - Benign-appering, observe - Recommend daily broad spectrum sunscreen SPF 30+ to sun-exposed areas, reapply every 2 hours as needed. - Call for any changes  Return if symptoms worsen or fail to improve.  Graciella Belton, RMA, am acting as scribe for Sarina Ser, MD . Documentation: I have reviewed the above documentation for accuracy and completeness, and I agree with the above.  Sarina Ser, MD

## 2021-06-03 NOTE — Patient Instructions (Signed)

## 2021-06-04 ENCOUNTER — Encounter: Payer: Self-pay | Admitting: Dermatology

## 2021-06-08 ENCOUNTER — Telehealth: Payer: Self-pay

## 2021-06-08 NOTE — Telephone Encounter (Signed)
Left message for patient to call office for results/hd 

## 2021-06-08 NOTE — Telephone Encounter (Signed)
-----   Message from Ralene Bathe, MD sent at 06/07/2021  6:15 PM EST ----- Diagnosis Skin , left nasal ala BASAL CELL CARCINOMA, NODULAR PATTERN, BASE INVOLVED  Cancer - BCC Make appt for discussion of treatment options (radiation vs surgery)

## 2021-06-09 ENCOUNTER — Other Ambulatory Visit: Payer: Self-pay

## 2021-06-09 ENCOUNTER — Ambulatory Visit: Payer: Medicare HMO

## 2021-06-09 DIAGNOSIS — R262 Difficulty in walking, not elsewhere classified: Secondary | ICD-10-CM

## 2021-06-09 DIAGNOSIS — R2681 Unsteadiness on feet: Secondary | ICD-10-CM

## 2021-06-09 DIAGNOSIS — R278 Other lack of coordination: Secondary | ICD-10-CM

## 2021-06-09 DIAGNOSIS — R2689 Other abnormalities of gait and mobility: Secondary | ICD-10-CM

## 2021-06-09 DIAGNOSIS — R269 Unspecified abnormalities of gait and mobility: Secondary | ICD-10-CM

## 2021-06-09 DIAGNOSIS — M6281 Muscle weakness (generalized): Secondary | ICD-10-CM

## 2021-06-09 NOTE — Therapy (Signed)
[place] MAIN [facility] SERVICES [address] [place], [place], [address] Phone: [phone]   Fax:  [phone]  Physical Therapy Treatment  Patient Details  Name: Dustin Ferrell MRN: [id number] Date of Birth: 04/19/1946 Referring Provider (PT): Dr. Joselyn Arrow   Encounter Date: 06/09/2021   PT End of Session - 06/09/21 1652     Visit Number 16    Number of Visits 25    Date for PT Re-Evaluation 08/04/21    Authorization Type [employer] Medicare    Authorization Time Period 05/12/21-08/04/21    Progress Note Due on Visit 20    PT Start Time 1645    PT Stop Time 1725    PT Time Calculation (min) 40 min    Equipment Utilized During Treatment Gait belt    Activity Tolerance Patient tolerated treatment well;Patient limited by fatigue    Behavior During Therapy [facility] for tasks assessed/performed             Past Medical History:  Diagnosis Date   Anxiety    associated with frontal temporal behavioral dementia   Basal cell carcinoma 06/03/2021   Left nasal ala - needs treatment   Dementia with behavioral disturbance    Diabetes mellitus without complication ([place])    History of traumatic head injury    Hypertension     Past Surgical History:  Procedure Laterality Date   Eye Socket fracture repair Right    HERNIA REPAIR     ROTATOR CUFF REPAIR Left    TONSILLECTOMY     WRIST FRACTURE SURGERY Right     There were no vitals filed for this visit.   Subjective Assessment - 06/09/21 1650     Subjective Pt doing well today, no significant updates. HEP being done daily. Pt reports disappointed that his left hip bursitis pain has not improved much.    Pertinent History Dustin Ferrell is a 71yoM who is referred to OPPT for imbalance and falls- pt also followed by Dr. Alvan Dame orthopedics for acute on chronic Left hip pain. Pt is followed by Dr. Manuella Ghazi c neurology. 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    Currently in Pain? Yes    Pain Score 3     Pain Location --   posterolateral gluteal area, posterior to Greater trochanter and into piriformis distribution   Pain Orientation Left             Treatment: -airex pad: reaching across midline to outside BOS to reach letters to play word game on magnet board x 8 minutes:  -seated isometric clam against gait belt 10x5secH (no relation to left hip pain)  -red mat box pattern AMB x2 -red mat 180 degree turns x10  -AMB fwd floor to red mat to airex to red mat to floor, turn and repeat x 4   Recovery break  -Fwd, backward cable resistance walking concentric/eccentric resistance at pelvis: 2x each, rest, then 2x Rt, Lt        PT Short Term Goals - 05/12/21 1040       PT SHORT TERM GOAL #1   Title Pt will be independent with HEP in order to improve strength and balance in order to decrease fall risk and improve function at home and work.    Baseline 11/15: not  doing HEP right now; 12/5:  doing HEP with help of spouse every day, but without spouse has difficulty with compliance. 05/13/2021- Due to some mild cognitive issues- this goal is not attainable as he requires some supervision with home program.    Time 6    Period Weeks    Status Unable to assess    Target Date 04/01/21               PT Long Term Goals - 05/12/21 1414       PT LONG TERM GOAL #1   Title Pt will improve FOTO to target score of 60 display perceived improvements in ability to complete ADL's.    Baseline 02/18/2021= 55%, 12/5: 46%    Time 12    Period Weeks    Status On-going    Target Date 08/04/21      PT LONG TERM GOAL #2   Title Pt will decrease 5TSTS by at least 3 seconds in order to demonstrate clinically significant improvement in LE strength.    Baseline 02/18/2021= 15 sec,  11/15: 17.3 sec, 12/5: 11.19s with BUE support, 14.55s no UE support; 05/12/2021= 11:35 sec    Time 12    Period Weeks    Status Achieved    Target Date 05/13/21      PT LONG TERM GOAL #3   Title Patient will increase Functional Gait Assessment score to >22/30 as to reduce fall risk and improve dynamic gait safety with community ambulation.    Baseline 02/18/2021= 17/30, 11/15: 14/30, 12/5: 13/30; 12/14= 18/30    Time 12    Period Weeks    Status On-going    Target Date 08/04/21      PT LONG TERM GOAL #4   Title Pt will increase 10MWT by at least 0.13 m/s in order to demonstrate clinically significant improvement in community ambulation.    Baseline 02/18/2021= 0.89 m/s without AD, 11/15: 0.97 m/s, 12/5: 1.41ms, no AD    Time 12    Period Weeks    Status Partially Met    Target Date 08/04/21      PT LONG TERM GOAL #5   Title Patient will report decreased left side buttock pain to less than 4/10 with transfers/mobility for improved functional mobility in home and community.    Baseline 05/12/2021= 6/10 left gluteal pain.                   Plan - 06/09/21 1734     Clinical Impression Statement Continued with POC. Pt able to extend static dual tasking activity at white board in time and incorporate more multitasking actiivty. Pt advanced to gait based motor control training on variable grades, surfaces, heights, and directions. Pt requires frequent breaks for recovery between activities. Vitals at EOS show 97% SpO2 on room air, HR in low 70s which may better explain DOE. Wife pleased to see pt's performance this date.    Personal Factors and Comorbidities Age;Comorbidity 3+    Comorbidities remote TBI, frontal dementia, DM    Examination-Activity Limitations Carry;Other;Locomotion Level    Examination-Participation Restrictions Community Activity;Yard Work    Stability/Clinical Decision Making Stable/Uncomplicated    CDesigner, jewelleryLow    Rehab Potential Good     PT Frequency 2x / week    PT Duration 12 weeks    PT Treatment/Interventions ADLs/Self Care Home Management;Cryotherapy;Moist Heat;DME Instruction;Gait training;Stair training;Functional mobility training;Therapeutic activities;Therapeutic exercise;Balance training;Neuromuscular re-education;Patient/family education;Manual techniques;Passive range of motion;Dry needling;Vestibular;Canalith Repostioning;Spinal Manipulations;Ultrasound  PT Next Visit Plan Continue with progressive  balance activities for improved static and dynamic balance; continue POC as previously indicated; Continue with manual therapy for pain relief and tightness in low back/gluteal region    PT Home Exercise Plan Access Code: [email or web address]; 04/15/2021=Access Code: [email or web address]; no udpates    Consulted and Agree with Plan of Care Patient;Family member/caregiver    Family Member Consulted wife             Patient will benefit from skilled therapeutic intervention in order to improve the following deficits and impairments:  Abnormal gait, Decreased activity tolerance, Decreased balance, Decreased coordination, Decreased cognition, Decreased mobility, Decreased safety awareness, Decreased strength, Impaired perceived functional ability  Visit Diagnosis: Unsteadiness on feet  Abnormality of gait and mobility  Muscle weakness (generalized)  Other lack of coordination  Difficulty in walking, not elsewhere classified  Other abnormalities of gait and mobility     Problem List Patient Active Problem List   Diagnosis Date Noted   Iron deficiency anemia 07/16/2019   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 07/16/2019   Serum potassium elevated 07/16/2019   High risk medications (not anticoagulants) long-term use 07/16/2019   Elevated LDL cholesterol level 06/25/2018   Healthcare maintenance 12/13/2017   Encounter for screening for malignant neoplasm of respiratory organs 07/12/2017   Overgrown toenails 07/12/2017   Vitamin D  insufficiency 04/28/2017   Closed fracture of one rib of left side 04/28/2017   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 ([place]) 04/28/2017   Diabetes mellitus due to underlying condition without complication, with long-term current use of insulin ([place]) 04/06/2017   Elbow swelling, left 04/06/2017   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[place]) 04/06/2017   Fatigue 04/06/2017   Hypertension associated with diabetes ([place]) 04/06/2017   Screening for AAA (abdominal aortic aneurysm) 04/06/2017   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 10/15/2014   Neurological deficit, transient 10/15/2014   Obstructive sleep apnea- noncompliance w txmnt 10/15/2014   Syncope 10/03/2014   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   Colon polyps 07/07/2014   Personal history of traumatic brain injury 07/07/2014   5:47 PM, 06/09/21 Etta Grandchild, PT, DPT Physical Therapist - [facility]  Outpatient Physical Therapy- [place] [phone]     [place], PT 06/09/2021, 5:46 PM  [place] MAIN [facility] SERVICES [address] [place], [place], [address] Phone: [phone]   Fax:  [phone]  Name: Dustin Ferrell MRN: [id number] Date of Birth: 12-28-1945

## 2021-06-16 ENCOUNTER — Telehealth: Payer: Self-pay

## 2021-06-16 ENCOUNTER — Other Ambulatory Visit: Payer: Self-pay

## 2021-06-16 ENCOUNTER — Ambulatory Visit: Payer: Medicare HMO

## 2021-06-16 DIAGNOSIS — R2681 Unsteadiness on feet: Secondary | ICD-10-CM | POA: Diagnosis not present

## 2021-06-16 DIAGNOSIS — R269 Unspecified abnormalities of gait and mobility: Secondary | ICD-10-CM

## 2021-06-16 DIAGNOSIS — M6281 Muscle weakness (generalized): Secondary | ICD-10-CM

## 2021-06-16 NOTE — Therapy (Signed)
[place] MAIN [facility] SERVICES [address] [place], [place], [address] Phone: [phone]   Fax:  [phone]  Physical Therapy Treatment  Patient Details  Name: Dustin Ferrell MRN: [id number] Date of Birth: 1945/09/21 Referring Provider (PT): Dr. Joselyn Arrow   Encounter Date: 06/16/2021   PT End of Session - 06/16/21 1012     Visit Number 17    Number of Visits 25    Date for PT Re-Evaluation 08/04/21    Authorization Type [employer]na Medicare    Authorization Time Period 05/12/21-08/04/21    Progress Note Due on Visit 20    PT Start Time 1015    PT Stop Time 1101    PT Time Calculation (min) 46 min    Equipment Utilized During Treatment Gait belt    Activity Tolerance Patient tolerated treatment well;Patient limited by fatigue    Behavior During Therapy [facility] for tasks assessed/performed             Past Medical History:  Diagnosis Date   Anxiety    associated with frontal temporal behavioral dementia   Basal cell carcinoma 06/03/2021   Left nasal ala - needs treatment   Dementia with behavioral disturbance    Diabetes mellitus without complication ([place])    History of traumatic head injury    Hypertension     Past Surgical History:  Procedure Laterality Date   Eye Socket fracture repair Right    HERNIA REPAIR     ROTATOR CUFF REPAIR Left    TONSILLECTOMY     WRIST FRACTURE SURGERY Right     There were no vitals filed for this visit.   Subjective Assessment - 06/16/21 1107     Subjective Patient presents with wife to PT session. Reports he wants to start working on strengthening and endurance tasks, reports balance has improved.    Pertinent History Dustin Ferrell is a 69yoM who is referred to OPPT for imbalance and falls- pt also followed by Dr. Alvan Dame orthopedics for acute on chronic Left hip pain. Pt is followed by Dr. Manuella Ghazi c neurology. 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    Currently in Pain? Yes    Pain Score 4     Pain Location Hip    Pain Orientation Left    Pain Descriptors / Indicators Aching    Pain Type Chronic pain    Pain Frequency Intermittent                      Treatment:  TherEx:  Squat with bar 15x  Modified backwards lunge 10x each LE ; more challenging with LLE  6" step: -step up/down 15x each LE -lateral step up/down 15x each LE  Seated laq with ball adduction 12x between feet  Stick rollout to L IT band x 4 minutes (patient performed and demonstrated understanding for home )  Seated adduction ball squeeze 15x 3 second holds       Hep performance and demonstration of understanding  Access Code: [email or web address] URL: [email or web address] Date: 06/16/2021 Prepared by: Janna Arch  Exercises Seated Long Arc Quad - 1 x daily - 7 x weekly - 2 sets - 10 reps - 5 hold Seated March - 1 x daily - 7 x weekly - 2 sets - 10 reps -  5 hold Seated Hip Adduction Isometrics with Ball - 1 x daily - 7 x weekly - 2 sets - 10 reps - 5 hold Seated Hip Abduction with Resistance - 1 x daily - 7 x weekly - 2 sets - 10 reps - 5 hold Seated Heel Toe Raises - 1 x daily - 7 x weekly - 2 sets - 10 reps - 5 hold Hand Squeezes - 1 x daily - 7 x weekly - 2 sets - 10 reps - 5 hold Finger Key Grip with Putty - 1 x daily - 7 x weekly - 2 sets - 10 reps - 5 hold Tip Pinch with Putty - 1 x daily - 7 x weekly - 2 sets - 10 reps - 5 hold    Pt educated throughout session about proper posture and technique with exercises. Improved exercise technique, movement at target joints, use of target muscles after min to mod verbal, visual, tactile cues     Transition from balance to strengthening performed today. He is educated on and demonstrated understanding of seated HEP for  strengthening as well as for hand strength. Request for referral for OT for hand strengthening and dexterity sent out today. Patient will continue to benefit from skilled PT to address these deficits and improve safety and independence with ambulation and functional mobility            PT Education - 06/16/21 1012     Education Details exercise technique, body mechanics    Person(s) Educated Patient    Methods Explanation;Demonstration;Tactile cues;Verbal cues    Comprehension Verbalized understanding;Returned demonstration;Verbal cues required;Tactile cues required              PT Short Term Goals - 05/12/21 1040       PT SHORT TERM GOAL #1   Title Pt will be independent with HEP in order to improve strength and balance in order to decrease fall risk and improve function at home and work.    Baseline 11/15: not doing HEP right now; 12/5:  doing HEP with help of spouse every day, but without spouse has difficulty with compliance. 05/13/2021- Due to some mild cognitive issues- this goal is not attainable as he requires some supervision with home program.    Time 6    Period Weeks    Status Unable to assess    Target Date 04/01/21               PT Long Term Goals - 05/12/21 1414       PT LONG TERM GOAL #1   Title Pt will improve FOTO to target score of 60 display perceived improvements in ability to complete ADL's.    Baseline 02/18/2021= 55%, 12/5: 46%    Time 12    Period Weeks    Status On-going    Target Date 08/04/21      PT LONG TERM GOAL #2   Title Pt will decrease 5TSTS by at least 3 seconds in order to demonstrate clinically significant improvement in LE strength.    Baseline 02/18/2021= 15 sec, 11/15: 17.3 sec, 12/5: 11.19s with BUE support, 14.55s no UE support; 05/12/2021= 11:35 sec    Time 12    Period Weeks    Status Achieved    Target Date 05/13/21      PT LONG TERM GOAL #3   Title Patient will increase Functional Gait Assessment score to  >22/30 as to reduce fall risk and improve dynamic gait safety with community ambulation.  Baseline 02/18/2021= 17/30, 11/15: 14/30, 12/5: 13/30; 12/14= 18/30    Time 12    Period Weeks    Status On-going    Target Date 08/04/21      PT LONG TERM GOAL #4   Title Pt will increase 10MWT by at least 0.13 m/s in order to demonstrate clinically significant improvement in community ambulation.    Baseline 02/18/2021= 0.89 m/s without AD, 11/15: 0.97 m/s, 12/5: 1.71ms, no AD    Time 12    Period Weeks    Status Partially Met    Target Date 08/04/21      PT LONG TERM GOAL #5   Title Patient will report decreased left side buttock pain to less than 4/10 with transfers/mobility for improved functional mobility in home and community.    Baseline 05/12/2021= 6/10 left gluteal pain.                   Plan - 06/16/21 1107     Clinical Impression Statement Transition from balance to strengthening performed today. He is educated on and demonstrated understanding of seated HEP for strengthening as well as for hand strength. Request for referral for OT for hand strengthening and dexterity sent out today. Patient will continue to benefit from skilled PT to address these deficits and improve safety and independence with ambulation and functional mobility    Personal Factors and Comorbidities Age;Comorbidity 3+    Comorbidities remote TBI, frontal dementia, DM    Examination-Activity Limitations Carry;Other;Locomotion Level    Examination-Participation Restrictions Community Activity;Yard Work    Stability/Clinical Decision Making Stable/Uncomplicated    Rehab Potential Good    PT Frequency 2x / week    PT Duration 12 weeks    PT Treatment/Interventions ADLs/Self Care Home Management;Cryotherapy;Moist Heat;DME Instruction;Gait training;Stair training;Functional mobility training;Therapeutic activities;Therapeutic exercise;Balance training;Neuromuscular re-education;Patient/family education;Manual  techniques;Passive range of motion;Dry needling;Vestibular;Canalith Repostioning;Spinal Manipulations;Ultrasound    PT Next Visit Plan Continue with progressive  balance activities for improved static and dynamic balance; continue POC as previously indicated; Continue with manual therapy for pain relief and tightness in low back/gluteal region    PT Home Exercise Plan Access Code: [email or web address]; 04/15/2021=Access Code: [email or web address]; no udpates    Consulted and Agree with Plan of Care Patient;Family member/caregiver    Family Member Consulted wife             Patient will benefit from skilled therapeutic intervention in order to improve the following deficits and impairments:  Abnormal gait, Decreased activity tolerance, Decreased balance, Decreased coordination, Decreased cognition, Decreased mobility, Decreased safety awareness, Decreased strength, Impaired perceived functional ability  Visit Diagnosis: Unsteadiness on feet  Abnormality of gait and mobility  Muscle weakness (generalized)     Problem List Patient Active Problem List   Diagnosis Date Noted   Iron deficiency anemia 07/16/2019   [place]al [place] 07/16/2019   Serum potassium elevated 07/16/2019   High risk medications (not anticoagulants) long-term use 07/16/2019   Elevated LDL cholesterol level 06/25/2018   Healthcare maintenance 12/13/2017   Encounter for screening for malignant neoplasm of respiratory organs 07/12/2017   Overgrown toenails 07/12/2017   Vitamin D insufficiency 04/28/2017   Closed fracture of one rib of left side 04/28/2017   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 ([place] 04/28/2017   Diabetes mellitus due to underlying condition without complication, with long-term current use of insulin ([place] 04/06/2017   Elbow swelling, left 04/06/2017   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[place] 04/06/2017   Fatigue 04/06/2017   Hypertension  associated with diabetes ([place]) 04/06/2017   Screening  for AAA (abdominal aortic aneurysm) 04/06/2017   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

## 2021-06-16 NOTE — Telephone Encounter (Signed)
I contacted patient's number first and the line picked up then hung up. Since this is the second time we've been unsuccessful reaching patient I called the number listed for his daughter, but she was not home at that time so I asked the male whom I spoke with to have her return my call.

## 2021-06-16 NOTE — Telephone Encounter (Signed)
-----   Message from Ralene Bathe, MD sent at 06/07/2021  6:15 PM EST ----- Diagnosis Skin , left nasal ala BASAL CELL CARCINOMA, NODULAR PATTERN, BASE INVOLVED  Cancer - BCC Make appt for discussion of treatment options (radiation vs surgery)

## 2021-06-17 ENCOUNTER — Ambulatory Visit: Payer: Medicare HMO

## 2021-06-22 ENCOUNTER — Other Ambulatory Visit: Payer: Self-pay

## 2021-06-22 ENCOUNTER — Ambulatory Visit: Payer: Medicare HMO

## 2021-06-22 DIAGNOSIS — M6281 Muscle weakness (generalized): Secondary | ICD-10-CM

## 2021-06-22 DIAGNOSIS — R2681 Unsteadiness on feet: Secondary | ICD-10-CM

## 2021-06-22 DIAGNOSIS — R269 Unspecified abnormalities of gait and mobility: Secondary | ICD-10-CM

## 2021-06-22 DIAGNOSIS — R262 Difficulty in walking, not elsewhere classified: Secondary | ICD-10-CM

## 2021-06-22 NOTE — Therapy (Signed)
[place] MAIN [facility] SERVICES [address] [place], [place], [address] Phone: [phone]   Fax:  [phone]  Physical Therapy Treatment  Patient Details  Name: Dustin Ferrell MRN: [id number] Date of Birth: [birth year] Referring Provider (PT): Dr. Joselyn Arrow   Encounter Date: 06/22/2021   PT End of Session - 06/22/21 1611     Visit Number 18    Number of Visits 25    Date for PT Re-Evaluation 08/04/21    Authorization Type [employer] Medicare    Authorization Time Period 05/12/21-08/04/21    Progress Note Due on Visit 20    PT Start Time 1600    PT Stop Time 1645    PT Time Calculation (min) 45 min    Equipment Utilized During Treatment Gait belt    Activity Tolerance Patient tolerated treatment well;Patient limited by fatigue    Behavior During Therapy [facility] for tasks assessed/performed             Past Medical History:  Diagnosis Date   Anxiety    associated with frontal temporal behavioral dementia   Basal cell carcinoma 06/03/2021   Left nasal ala - needs treatment   Dementia with behavioral disturbance    Diabetes mellitus without complication ([place])    History of traumatic head injury    Hypertension     Past Surgical History:  Procedure Laterality Date   Eye Socket fracture repair Right    HERNIA REPAIR     ROTATOR CUFF REPAIR Left    TONSILLECTOMY     WRIST FRACTURE SURGERY Right     There were no vitals filed for this visit.   Subjective Assessment - 06/22/21 1604     Subjective Patient presents with wife today and no new issues. He does have an order from Dr. Caryn Section to evaluate for back and hip pain but denies any pain at this time and would like to continue to focus on LE strengthening.    Pertinent History Dustin Ferrell is a 78yoM who is referred to OPPT for imbalance and falls- pt also followed by Dr. Alvan Dame orthopedics for acute on chronic Left hip pain. Pt is followed by Dr. Manuella Ghazi c neurology. 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    Limitations Standing;Walking;House hold activities    Currently in Pain? No/denies    Pain Location --    Pain Descriptors / Indicators --             INTERVENTIONS:   Therapeutic exercises:   Forward/backward step over 1/2 foam roll with BUE Support at support bar x 15 reps each   Side step over 1/2 foam roll with BUE Support at support bar x 15 reps each   Sit to stand: feet staggered with front leg compromised by placing foot on blue airex pad x 4 reps each leg then 4 more reps on firm surface with feet equal.     Standing LE strengthening at support bar:   -Standing hip march (high knees)  - Standing Hip ext - Standing hip abd - Standing mini squats -Standing toe raises -Standing heel raises 12 reps each Leg using gait belt, UE support at support bar.  Education provided throughout session via VC/TC and demonstration to facilitate movement at target joints and correct muscle activation  for all testing and exercises performed.   Added paper HEP with pics of all above standing therex- Instruction to perform up to 3 sets of 10 reps 3x/week. Patient and wife verbalized understanding.                      PT Education - 06/22/21 1706     Education Details Exercise standing LE technique- Issued handout    Person(s) Educated Patient;Spouse    Methods Explanation;Demonstration;Tactile cues;Verbal cues    Comprehension Verbalized understanding;Returned demonstration;Verbal cues required;Tactile cues required;Need further instruction              PT Short Term Goals - 05/12/21 1040       PT SHORT TERM GOAL #1   Title Pt will be independent with HEP in order to improve strength and balance in order to decrease  fall risk and improve function at home and work.    Baseline 11/15: not doing HEP right now; 12/5:  doing HEP with help of spouse every day, but without spouse has difficulty with compliance. 05/13/2021- Due to some mild cognitive issues- this goal is not attainable as he requires some supervision with home program.    Time 6    Period Weeks    Status Unable to assess    Target Date 04/01/21               PT Long Term Goals - 05/12/21 1414       PT LONG TERM GOAL #1   Title Pt will improve FOTO to target score of 60 display perceived improvements in ability to complete ADL's.    Baseline 02/18/2021= 55%, 12/5: 46%    Time 12    Period Weeks    Status On-going    Target Date 08/04/21      PT LONG TERM GOAL #2   Title Pt will decrease 5TSTS by at least 3 seconds in order to demonstrate clinically significant improvement in LE strength.    Baseline 02/18/2021= 15 sec, 11/15: 17.3 sec, 12/5: 11.19s with BUE support, 14.55s no UE support; 05/12/2021= 11:35 sec    Time 12    Period Weeks    Status Achieved    Target Date 05/13/21      PT LONG TERM GOAL #3   Title Patient will increase Functional Gait Assessment score to >22/30 as to reduce fall risk and improve dynamic gait safety with community ambulation.    Baseline 02/18/2021= 17/30, 11/15: 14/30, 12/5: 13/30; 12/14= 18/30    Time 12    Period Weeks    Status On-going    Target Date 08/04/21      PT LONG TERM GOAL #4   Title Pt will increase 10MWT by at least 0.13 m/s in order to demonstrate clinically significant improvement in community ambulation.    Baseline 02/18/2021= 0.89 m/s without AD, 11/15: 0.97 m/s, 12/5: 1.4ms, no AD    Time 12    Period Weeks    Status Partially Met    Target Date 08/04/21      PT LONG TERM GOAL #5   Title Patient will report decreased left side buttock pain to less than 4/10 with transfers/mobility for improved functional mobility in home and community.    Baseline 05/12/2021= 6/10 left  gluteal pain.                   Plan - 06/22/21 1611     Clinical Impression Statement Patient continued with  strength training per his preference and denied any back/hip pain today (has new order to eval and treat) and may need an evaluation in future if pain returns. He performed well with standing LE strengthening with verbal cues. He has hearing loss and requires loud commands. He did well with visual demo and issued HEP for strengthening. Patient will continue to benefit from skilled PT to address these deficits and improve safety and independence with ambulation and functional mobility    Personal Factors and Comorbidities Age;Comorbidity 3+    Comorbidities remote TBI, frontal dementia, DM    Examination-Activity Limitations Carry;Other;Locomotion Level    Examination-Participation Restrictions Community Activity;Yard Work    Stability/Clinical Decision Making Stable/Uncomplicated    Rehab Potential Good    PT Frequency 2x / week    PT Duration 12 weeks    PT Treatment/Interventions ADLs/Self Care Home Management;Cryotherapy;Moist Heat;DME Instruction;Gait training;Stair training;Functional mobility training;Therapeutic activities;Therapeutic exercise;Balance training;Neuromuscular re-education;Patient/family education;Manual techniques;Passive range of motion;Dry needling;Vestibular;Canalith Repostioning;Spinal Manipulations;Ultrasound    PT Next Visit Plan Continue with progressive LE Strengthening and balance activities for improved mobility static and dynamic balance; continue POC as previously indicated; Continue with manual therapy for pain relief and tightness in low back/gluteal region    PT Home Exercise Plan Access Code: [email or web address]; 04/15/2021=Access Code: [email or web address]; no udpates    Consulted and Agree with Plan of Care Patient;Family member/caregiver    Family Member Consulted wife             Patient will benefit from skilled therapeutic intervention in order to  improve the following deficits and impairments:  Abnormal gait, Decreased activity tolerance, Decreased balance, Decreased coordination, Decreased cognition, Decreased mobility, Decreased safety awareness, Decreased strength, Impaired perceived functional ability  Visit Diagnosis: Abnormality of gait and mobility  Difficulty in walking, not elsewhere classified  Muscle weakness (generalized)  Unsteadiness on feet     Problem List Patient Active Problem List   Diagnosis Date Noted   Iron deficiency anemia 07/16/2019   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 07/16/2019   Serum potassium elevated 07/16/2019   High risk medications (not anticoagulants) long-term use 07/16/2019   Elevated LDL cholesterol level 06/25/2018   Healthcare maintenance 12/13/2017   Encounter for screening for malignant neoplasm of respiratory organs 07/12/2017   Overgrown toenails 07/12/2017   Vitamin D insufficiency 04/28/2017   Closed fracture of one rib of left side 04/28/2017   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 ([place]) 04/28/2017   Diabetes mellitus due to underlying condition without complication, with long-term current use of insulin ([place]) 04/06/2017   Elbow swelling, left 04/06/2017   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 [place] MA[facility] SERVICES [address] [place], [place],  [address] Phone: [phone]   Fax:  [phone]  Name: Dustin Ferrell MRN: [id number] Date of Birth: August 23, 1945

## 2021-06-24 ENCOUNTER — Other Ambulatory Visit: Payer: Self-pay

## 2021-06-24 ENCOUNTER — Ambulatory Visit: Payer: Medicare HMO

## 2021-06-24 DIAGNOSIS — R278 Other lack of coordination: Secondary | ICD-10-CM

## 2021-06-24 DIAGNOSIS — R2689 Other abnormalities of gait and mobility: Secondary | ICD-10-CM

## 2021-06-24 DIAGNOSIS — M6281 Muscle weakness (generalized): Secondary | ICD-10-CM

## 2021-06-24 DIAGNOSIS — R2681 Unsteadiness on feet: Secondary | ICD-10-CM

## 2021-06-24 NOTE — Therapy (Signed)
[place] MAIN [facility] SERVICES [address] [place], [place], [address] Phone: [phone]   Fax:  [phone]  Physical Therapy Treatment  Patient Details  Name: Dustin Ferrell MRN: [id number] Date of Birth: 01-06-1946 Referring Provider (PT): Dr. Joselyn Arrow   Encounter Date: 06/24/2021   PT End of Session - 06/24/21 1009     Visit Number 19    Number of Visits 25    Date for PT Re-Evaluation 08/04/21    Authorization Type [employer] Medicare    Authorization Time Period 05/12/21-08/04/21    Progress Note Due on Visit 20    PT Start Time 0847    PT Stop Time 0927    PT Time Calculation (min) 40 min    Equipment Utilized During Treatment Gait belt    Activity Tolerance Patient tolerated treatment well    Behavior During Therapy [facility] for tasks assessed/performed             Past Medical History:  Diagnosis Date   Anxiety    associated with frontal temporal behavioral dementia   Basal cell carcinoma 06/03/2021   Left nasal ala - needs treatment   Dementia with behavioral disturbance    Diabetes mellitus without complication ([place])    History of traumatic head injury    Hypertension     Past Surgical History:  Procedure Laterality Date   Eye Socket fracture repair Right    HERNIA REPAIR     ROTATOR CUFF REPAIR Left    TONSILLECTOMY     WRIST FRACTURE SURGERY Right     There were no vitals filed for this visit.   Subjective Assessment - 06/24/21 0849     Subjective Pt reports some soreness today but no pain. He denies falls/LOB.    Patient is accompained by: Family member    Pertinent History Darshan Solanki is a 55yoM who is referred to OPPT for imbalance and falls- pt also followed by Dr. Alvan Dame orthopedics for acute on chronic Left hip pain. Pt is followed by Dr. Manuella Ghazi c neurology. 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    Limitations Standing;Walking;House hold activities    How long can you sit comfortably? no restriction    How long can you stand comfortably? 15 min    How long can you walk comfortably? 10 min    Patient Stated Goals I want to improve my balance and not fall    Currently in Pain? No/denies    Pain Onset More than a month ago             INTERVENTIONS:    Therapeutic exercises:    Side step over 1/2 foam roll with BUE Support at support bar x multiple reps of each with UUE support and 3 finger support. Pt rates as medium challenge. Pt exhibits greater difficulty with decreased support.    Forward/backward over 1/2 foam roll with BUE Support at support bar x 15 reps each. Pt reports intervention is a medium challenging.     Side step over orange hurdle x 20 reps. Pt uses UUE support to 3 finger support. Pt improves foot clearance.   Forward/backward stepping over orange hurdle - 20x. Pt rates as medium and uses UUE support on bar.   Sit to stand: feet staggered with front  leg compromised by placing foot on blue airex pad 2x 4 reps each leg - pt rates as medium. Slight decrease in postural stability where pt utilizes step strategy to regain balance. Pt exhibits improved balance with RLE on firm surface compared to LLE.    Standing LE strengthening at support bar:    -Standing hip march (high knees)  - Standing Hip ext - Standing hip abd - Standing mini squats -Standing toe raises -Seated toe raises  -Standing heel raises - pt rates as medium -Seated heel raises  15-16 reps each Leg using gait belt, UE support at support bar.   Modified backwards lunge 1x10 and 1x12 each LE ; Pt rates intervention as "almost easy"  Education provided throughout session via VC/TC and demonstration to facilitate movement at target joints and correct muscle activation for all testing  and exercises performed.      PT Education - 06/24/21 1009     Education Details exercise technique    Person(s) Educated Patient    Methods Explanation;Demonstration;Verbal cues    Comprehension Verbalized understanding;Returned demonstration;Verbal cues required;Need further instruction              PT Short Term Goals - 05/12/21 1040       PT SHORT TERM GOAL #1   Title Pt will be independent with HEP in order to improve strength and balance in order to decrease fall risk and improve function at home and work.    Baseline 11/15: not doing HEP right now; 12/5:  doing HEP with help of spouse every day, but without spouse has difficulty with compliance. 05/13/2021- Due to some mild cognitive issues- this goal is not attainable as he requires some supervision with home program.    Time 6    Period Weeks    Status Unable to assess    Target Date 04/01/21               PT Long Term Goals - 05/12/21 1414       PT LONG TERM GOAL #1   Title Pt will improve FOTO to target score of 60 display perceived improvements in ability to complete ADL's.    Baseline 02/18/2021= 55%, 12/5: 46%    Time 12    Period Weeks    Status On-going    Target Date 08/04/21      PT LONG TERM GOAL #2   Title Pt will decrease 5TSTS by at least 3 seconds in order to demonstrate clinically significant improvement in LE strength.    Baseline 02/18/2021= 15 sec, 11/15: 17.3 sec, 12/5: 11.19s with BUE support, 14.55s no UE support; 05/12/2021= 11:35 sec    Time 12    Period Weeks    Status Achieved    Target Date 05/13/21      PT LONG TERM GOAL #3   Title Patient will increase Functional Gait Assessment score to >22/30 as to reduce fall risk and improve dynamic gait safety with community ambulation.    Baseline 02/18/2021= 17/30, 11/15: 14/30, 12/5: 13/30; 12/14= 18/30    Time 12    Period Weeks    Status On-going    Target Date 08/04/21      PT LONG TERM GOAL #4   Title Pt will increase 10MWT by  at least 0.13 m/s in order to demonstrate clinically significant improvement in community ambulation.    Baseline 02/18/2021= 0.89 m/s without AD, 11/15: 0.97 m/s, 12/5: 1.41ms, no AD    Time 12  Period Weeks    Status Partially Met    Target Date 08/04/21      PT LONG TERM GOAL #5   Title Patient will report decreased left side buttock pain to less than 4/10 with transfers/mobility for improved functional mobility in home and community.    Baseline 05/12/2021= 6/10 left gluteal pain.                   Plan - 06/24/21 1010     Clinical Impression Statement Pt highly motivated to participate in session. He responded to all interventions well without excessive fatigue or reports of pain. The pt was ablle to increase number of reps with interventions, indicating improved LE strength and endurance. The pt was most challenged with standing DF, and exhibited decreased AROM. The pt will benefit from further skilled PT to address these deficits in order to improve safety and independence with ambulation and functional mobility.    Personal Factors and Comorbidities Age;Comorbidity 3+    Comorbidities remote TBI, frontal dementia, DM    Examination-Activity Limitations Carry;Other;Locomotion Level    Examination-Participation Restrictions Community Activity;Yard Work    Stability/Clinical Decision Making Stable/Uncomplicated    Rehab Potential Good    PT Frequency 2x / week    PT Duration 12 weeks    PT Treatment/Interventions ADLs/Self Care Home Management;Cryotherapy;Moist Heat;DME Instruction;Gait training;Stair training;Functional mobility training;Therapeutic activities;Therapeutic exercise;Balance training;Neuromuscular re-education;Patient/family education;Manual techniques;Passive range of motion;Dry needling;Vestibular;Canalith Repostioning;Spinal Manipulations;Ultrasound    PT Next Visit Plan Continue with progressive LE Strengthening and balance activities for improved mobility  static and dynamic balance; continue POC as previously indicated; Continue with manual therapy for pain relief and tightness in low back/gluteal region    PT Home Exercise Plan Access Code: [email or web address]; 04/15/2021=Access Code: [email or web address]; no udpates    Consulted and Agree with Plan of Care Patient;Family member/caregiver    Family Member Consulted wife             Patient will benefit from skilled therapeutic intervention in order to improve the following deficits and impairments:  Abnormal gait, Decreased activity tolerance, Decreased balance, Decreased coordination, Decreased cognition, Decreased mobility, Decreased safety awareness, Decreased strength, Impaired perceived functional ability  Visit Diagnosis: Muscle weakness (generalized)  Other lack of coordination  Unsteadiness on feet  Other abnormalities of gait and mobility     Problem List Patient Active Problem List   Diagnosis Date Noted   Iron deficiency anemia 07/16/2019   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 07/16/2019   Serum potassium elevated 07/16/2019   High risk medications (not anticoagulants) long-term use 07/16/2019   Elevated LDL cholesterol level 06/25/2018   Healthcare maintenance 12/13/2017   Encounter for screening for malignant neoplasm of respiratory organs 07/12/2017   Overgrown toenails 07/12/2017   Vitamin D insufficiency 04/28/2017   Closed fracture of one rib of left side 04/28/2017   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 ([place]) 04/28/2017   Diabetes mellitus due to underlying condition without complication, with long-term current use of insulin ([place]) 04/06/2017   Elbow swelling, left 04/06/2017   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/2017   Fatigue 04/06/2017   Hypertension associated with diabetes ([place]) 04/06/2017   Screening for AAA (abdominal aortic aneurysm) 04/06/2017   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

## 2021-06-29 ENCOUNTER — Ambulatory Visit: Payer: Medicare HMO

## 2021-06-29 ENCOUNTER — Other Ambulatory Visit: Payer: Self-pay

## 2021-06-29 DIAGNOSIS — M6281 Muscle weakness (generalized): Secondary | ICD-10-CM

## 2021-06-29 DIAGNOSIS — R2681 Unsteadiness on feet: Secondary | ICD-10-CM

## 2021-06-29 DIAGNOSIS — R278 Other lack of coordination: Secondary | ICD-10-CM

## 2021-06-29 DIAGNOSIS — R262 Difficulty in walking, not elsewhere classified: Secondary | ICD-10-CM

## 2021-06-29 DIAGNOSIS — R269 Unspecified abnormalities of gait and mobility: Secondary | ICD-10-CM

## 2021-06-29 NOTE — Therapy (Signed)
[place] MAIN [facility] SERVICES [address] [place], [place], [address] Phone: [phone]   Fax:  [phone]  Occupational Therapy Evaluation  Patient Details  Name: Dustin Ferrell MRN: [id number] Date of Birth: 13-Mar-1946 No data recorded  Encounter Date: 06/29/2021   OT End of Session - 06/29/21 1720     Visit Number 1    Number of Visits 12    Date for OT Re-Evaluation 08/10/21    OT Start Time 1503    OT Stop Time 1600    OT Time Calculation (min) 57 min    Activity Tolerance Patient tolerated treatment well    Behavior During Therapy [facility] for tasks assessed/performed             Past Medical History:  Diagnosis Date   Anxiety    associated with frontal temporal behavioral dementia   Basal cell carcinoma 06/03/2021   Left nasal ala - needs treatment   Dementia with behavioral disturbance    Diabetes mellitus without complication ([place])    History of traumatic head injury    Hypertension     Past Surgical History:  Procedure Laterality Date   Eye Socket fracture repair Right    HERNIA REPAIR     ROTATOR CUFF REPAIR Left    TONSILLECTOMY     WRIST FRACTURE SURGERY Right     There were no vitals filed for this visit.      [facility] OT Assessment - 06/29/21 0001       Assessment   Prior Therapy no OT, yes to outpatient PT      Balance Screen   Has the patient fallen in the past 6 months Yes    How many times? "maybe twice"    Has the patient had a decrease in activity level because of a fear of falling?  Yes    Is the patient reluctant to leave their home because of a fear of falling?  No      Home  Environment   Family/patient expects to be discharged to: Private residence    Living Arrangements Spouse/significant other    Available Help at Discharge Friend(s)    Type of [place] One level    Alternate Level Stairs - Number of Steps 1    [place] - single  point;Grab bars - tub/shower      Prior Function   Level of Independence Independent    Vocation Retired    Development worker, community at an [place] Enjoys  outdoors- [place], gardening, yard work      Vision - History   [place] Wears glasses all the time   doesn't wear glasses all the time but states he "should" (mask fogs up glasses)     Coordination   Right 9 Hole Peg Test 41 sec    Left 9 Hole Peg Test 34 sec    Tremors mild bilat hand tremors, R slightly worse than L, per pt; tremors worsen with back pain      Hand Function   Right Hand Grip (lbs) 64    Right Hand Lateral Pinch 16 lbs    Right Hand 3 Point Pinch 13 lbs    Left Hand Grip (lbs) 36   hx of broken wrist 30 years ago   Left Hand Lateral Pinch 10 lbs    Left 3 point pinch 9  lbs                                           Patient will benefit from skilled therapeutic intervention in order to improve the following deficits and impairments:           Visit Diagnosis: Muscle weakness (generalized)  Other lack of coordination    Problem List Patient Active Problem List   Diagnosis Date Noted   Iron deficiency anemia 07/16/2019   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 07/16/2019   Serum potassium elevated 07/16/2019   High risk medications (not anticoagulants) long-term use 07/16/2019   Elevated LDL cholesterol level 06/25/2018   Healthcare maintenance 12/13/2017   Encounter for screening for malignant neoplasm of respiratory organs 07/12/2017   Overgrown toenails 07/12/2017   Vitamin D insufficiency 04/28/2017   Closed fracture of one rib of left side 04/28/2017   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 ([place]) 04/28/2017   Diabetes mellitus due to underlying condition without complication, with long-term current use of insulin ([place]) 04/06/2017   Elbow swelling, left 04/06/2017   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[place]) 04/06/2017    Fatigue 04/06/2017   Hypertension associated with diabetes ([place]) 04/06/2017   Screening for AAA (abdominal aortic aneurysm) 04/06/2017   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

## 2021-06-29 NOTE — Therapy (Signed)
[place] MAIN [place] SERVICES [address] [place], [place], [address] Phone: [phone]   Fax:  [phone]  Physical Therapy Treatment/Physical Therapy Progress Note   Dates of reporting period 05/03/2022   to   06/29/2021  Patient Details  Name: Dustin Ferrell MRN: [id number] Date of Birth: 1946-05-16 Referring Provider (PT): Dr. Joselyn Arrow   Encounter Date: 06/29/2021   PT End of Session - 06/29/21 1602     Visit Number 20    Number of Visits 25    Date for PT Re-Evaluation 08/04/21    Authorization Type [employer]    Authorization Time Period 05/12/21-08/04/21    Progress Note Due on Visit 20    PT Start Time 1600    PT Stop Time 1643    PT Time Calculation (min) 43 min    Equipment Utilized During Treatment Gait belt    Activity Tolerance Patient tolerated treatment well    Behavior During Therapy Dustin Ferrell for tasks assessed/performed             Past Medical History:  Diagnosis Date   Anxiety    associated with frontal temporal behavioral dementia   Basal cell carcinoma 06/03/2021   Left nasal ala - needs treatment   Dementia with behavioral disturbance    Diabetes mellitus without complication ([place])    History of traumatic head injury    Hypertension     Past Surgical History:  Procedure Laterality Date   Eye Socket fracture repair Right    HERNIA REPAIR     ROTATOR CUFF REPAIR Left    TONSILLECTOMY     WRIST FRACTURE SURGERY Right     There were no vitals filed for this visit.   Subjective Assessment - 06/29/21 1600     Subjective Pt reports some continued soreness in hips - A little bit but not really pain.    Patient is accompained by: Family member    Pertinent History Dustin Ferrell is a 15yoM who is referred to OPPT for imbalance and falls- pt also followed by Dr. Alvan Dame orthopedics for acute on chronic Left hip pain. Pt is followed by Dr. Manuella Ghazi c neurology. 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    Limitations Standing;Walking;House hold activities    How long can you sit comfortably? no restriction    How long can you stand comfortably? 15 min    How long can you walk comfortably? 10 min    Patient Stated Goals I want to improve my balance and not fall    Currently in Pain? Yes    Pain Location Hip    Pain Orientation Right;Left    Pain Descriptors / Indicators Sore    Pain Type Chronic pain    Pain Onset More than a month ago             INTERVENTIONS:   FOTO= 57 (improved from last scored= 46)   [facility] PT Assessment - 06/29/21 1619       Functional Gait  Assessment   Gait assessed  Yes    Gait Level Surface Walks 20 ft in less than 5.5 sec, no assistive devices, good speed, no evidence for imbalance, normal gait pattern, deviates no more than 6 in outside of the 12 in walkway width.  Change in Gait Speed Makes only minor adjustments to walking speed, or accomplishes a change in speed with significant gait deviations, deviates 10-15 in outside the 12 in walkway width, or changes speed but loses balance but is able to recover and continue walking.    Gait with Horizontal Head Turns Performs head turns with moderate changes in gait velocity, slows down, deviates 10-15 in outside 12 in walkway width but recovers, can continue to walk.    Gait with Vertical Head Turns Performs task with slight change in gait velocity (eg, minor disruption to smooth gait path), deviates 6 - 10 in outside 12 in walkway width or uses assistive device    Gait and Pivot Turn Pivot turns safely within 3 sec and stops quickly with no loss of balance.    Step Over Obstacle Is able to step over 2 stacked shoe boxes taped together (9 in total height) without changing gait speed. No evidence of  imbalance.    Gait with Narrow Base of Support Ambulates less than 4 steps heel to toe or cannot perform without assistance.    Gait with Eyes Closed Walks 20 ft, uses assistive device, slower speed, mild gait deviations, deviates 6-10 in outside 12 in walkway width. Ambulates 20 ft in less than 9 sec but greater than 7 sec.    Ambulating Backwards Walks 20 ft, uses assistive device, slower speed, mild gait deviations, deviates 6-10 in outside 12 in walkway width.    Steps Alternating feet, must use rail.    Total Score 19             FGA:  19/30  10MWT: 1.15 m/s   Therapeutic Exercises:   Standing LE strengthening at support bar:    -Standing hip march (high knees)  - Standing Hip ext - Standing hip abd - Standing mini squats -Standing toe raises on 1/2 foam roll  -Standing heel raises on 1/2 foam roll 15 reps each Leg using gait belt, UE support at support bar.    Modified backwards lunge x 15 alt  LE. VC to bend both knees     static tandem stand on 1/2 foam - Attempted with curved side down - patient unable to maintain balance. Switched to curve side up and patient able to stand 2-5 sec x multiple attempts.  Education provided throughout session via VC/TC and demonstration to facilitate movement at target joints and correct muscle activation for all testing and exercises performed.    .            PT Education - 06/29/21 1601     Education Details Exercise technique    Person(s) Educated Patient    Methods Explanation;Demonstration;Tactile cues;Verbal cues    Comprehension Verbalized understanding;Returned demonstration;Verbal cues required;Tactile cues required;Need further instruction              PT Short Term Goals - 05/12/21 1040       PT SHORT TERM GOAL #1   Title Pt will be independent with HEP in order to improve strength and balance in order to decrease fall risk and improve function at home and work.    Baseline 11/15: not doing HEP  right now; 12/5:  doing HEP with help of spouse every day, but without spouse has difficulty with compliance. 05/13/2021- Due to some mild cognitive issues- this goal is not attainable as he requires some supervision with home program.    Time 6    Period Weeks    Status Unable to  assess    Target Date 04/01/21               PT Long Term Goals - 06/29/21 1607       PT LONG TERM GOAL #1   Title Pt will improve FOTO to target score of 60 display perceived improvements in ability to complete ADL's.    Baseline 02/18/2021= 55%, 12/5: 46%; 06/29/2021= 57%    Time 12    Period Weeks    Status On-going    Target Date 08/04/21      PT LONG TERM GOAL #2   Title Pt will decrease 5TSTS by at least 3 seconds in order to demonstrate clinically significant improvement in LE strength.    Baseline 02/18/2021= 15 sec, 11/15: 17.3 sec, 12/5: 11.19s with BUE support, 14.55s no UE support; 05/12/2021= 11:35 sec    Time 12    Period Weeks    Status Achieved    Target Date 05/13/21      PT LONG TERM GOAL #3   Title Patient will increase Functional Gait Assessment score to >22/30 as to reduce fall risk and improve dynamic gait safety with community ambulation.    Baseline 02/18/2021= 17/30, 11/15: 14/30, 12/5: 13/30; 12/14= 18/30; 06/29/2021= 19/30    Time 12    Period Weeks    Status On-going    Target Date 08/04/21      PT LONG TERM GOAL #4   Title Pt will increase 10MWT by at least 0.13 m/s in order to demonstrate clinically significant improvement in community ambulation.    Baseline 02/18/2021= 0.89 m/s without AD, 11/15: 0.97 m/s, 12/5: 1.25ms, no AD; 06/29/2021= 1.15 m/s without an AD    Time 12    Period Weeks    Status Achieved    Target Date 08/04/21      PT LONG TERM GOAL #5   Title Patient will report decreased left side buttock pain to less than 4/10 with transfers/mobility for improved functional mobility in home and community.    Baseline 05/12/2021= 6/10 left gluteal pain.;  06/29/2021- Patient states his gluteal pain has much improved and only rates as "sore" at times but not really painful.    Time 12    Period Weeks    Status Achieved                   Plan - 06/29/21 1602     Clinical Impression Statement Patient continues to demonstrate improvement- as seen in FGA, FOTO and 10 meter walk test. Patient demonstrated improved self perceived abilities and denies any recent falls. He met his gait speed goal and ambulating well without an AD yet continues to have some impaired dynamic standing balance difficulty as seen by FGA score of 19/30. Patient's condition has the potential to improve in response to therapy. Maximum improvement is yet to be obtained. The anticipated improvement is attainable and reasonable in a generally predictable time. Patient will continue to benefit from skilled PT to improve balance and strength to decrease falls risk    Personal Factors and Comorbidities Age;Comorbidity 3+    Comorbidities remote TBI, frontal dementia, DM    Examination-Activity Limitations Carry;Other;Locomotion Level    Examination-Participation Restrictions Community Activity;Yard Work    Stability/Clinical Decision Making Stable/Uncomplicated    Rehab Potential Good    PT Frequency 2x / week    PT Duration 12 weeks    PT Treatment/Interventions ADLs/Self Care Home Management;Cryotherapy;Moist Heat;DME Instruction;Gait training;Stair training;Functional mobility training;Therapeutic activities;Therapeutic exercise;Balance training;Neuromuscular  re-education;Patient/family education;Manual techniques;Passive range of motion;Dry needling;Vestibular;Canalith Repostioning;Spinal Manipulations;Ultrasound    PT Next Visit Plan Continue with progressive LE Strengthening and balance activities for improved mobility static and dynamic balance; continue POC as previously indicated; Continue with manual therapy for pain relief and tightness in low back/gluteal region     PT Home Exercise Plan Access Code: [email or web address]; 04/15/2021=Access Code: [email or web address]; no udpates    Consulted and Agree with Plan of Care Patient;Family member/caregiver    Family Member Consulted wife             Patient will benefit from skilled therapeutic intervention in order to improve the following deficits and impairments:  Abnormal gait, Decreased activity tolerance, Decreased balance, Decreased coordination, Decreased cognition, Decreased mobility, Decreased safety awareness, Decreased strength, Impaired perceived functional ability  Visit Diagnosis: Abnormality of gait and mobility  Difficulty in walking, not elsewhere classified  Muscle weakness (generalized)  Unsteadiness on feet     Problem List Patient Active Problem List   Diagnosis Date Noted   Iron deficiency anemia 07/16/2019   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 07/16/2019   Serum potassium elevated 07/16/2019   High risk medications (not anticoagulants) long-term use 07/16/2019   Elevated LDL cholesterol level 06/25/2018   Healthcare maintenance 12/13/2017   Encounter for screening for malignant neoplasm of respiratory organs 07/12/2017   Overgrown toenails 07/12/2017   Vitamin D insufficiency 04/28/2017   Closed fracture of one rib of left side 04/28/2017   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 ([place]) 04/28/2017   Diabetes mellitus due to underlying condition without complication, with long-term current use of insulin ([place]) 04/06/2017   Elbow swelling, left 04/06/2017   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[place]) 04/06/2017   Fatigue 04/06/2017   Hypertension associated with diabetes ([place]) 04/06/2017   Screening for AAA (abdominal aortic aneurysm) 04/06/2017   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 [place], [place], [address] Phone: [phone]   Fax:  [phone]  Name: Dustin Ferrell MRN: [id number] Date of Birth: 10-04-1945

## 2021-06-30 NOTE — Addendum Note (Signed)
Addended by: Darleene Cleaver on: 06/30/2021 11:23 AM   Modules accepted: Orders

## 2021-06-30 NOTE — Therapy (Addendum)
[place] [facility] SERVICES [address] [place], [place], [address] Phone: [phone]   Fax:  [phone]  Occupational Therapy Evaluation  Patient Details  Name: Dustin Ferrell MRN: [id number] Date of Birth: Sep 16, 1945 Referring Provider (OT): Dr. Manuella Ghazi   Encounter Date: 06/29/2021   OT End of Session - 06/29/21 1720     Visit Number 1    Number of Visits 12    Date for OT Re-Evaluation 08/10/21    OT Start Time 1503    OT Stop Time 1600    OT Time Calculation (min) 57 min    Activity Tolerance Patient tolerated treatment well    Behavior During Therapy [facility] for tasks assessed/performed             Past Medical History:  Diagnosis Date   Anxiety    associated with frontal temporal behavioral dementia   Basal cell carcinoma 06/03/2021   Left nasal ala - needs treatment   Dementia with behavioral disturbance    Diabetes mellitus without complication ([place])    History of traumatic head injury    Hypertension     Past Surgical History:  Procedure Laterality Date   Eye Socket fracture repair Right    HERNIA REPAIR     ROTATOR CUFF REPAIR Left    TONSILLECTOMY     WRIST FRACTURE SURGERY Right     There were no vitals filed for this visit.   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 Yes    Pain Score 4     Pain Location Hip    Pain Orientation Left    Pain Descriptors / Indicators Sore    Pain Type Chronic pain    Pain Onset More than a month ago    Pain Frequency Intermittent    Aggravating Factors  worse with transfers/walking in the morning, improves as  the day goes on    Pain Relieving Factors stretching    Effect of Pain on Daily Activities difficulty walking    Multiple Pain Sites No           Occupational Therapy Evaluation: Pt is a 76 y/o male who presents to outpatient Occupational Therapy d/t reported decline in hand strength.  Pt has noted dupuytren's contracture in bilat hands, L worse than R, mild essential tremors which impact self feeding accuracy, decreased dexterity affecting ability to manage small buttons and pick up small ADL supplies from table top.  Pt's goal is to improve strength and coordination in bilat hands.  OT planning 2w6 to address above noted deficits.  Pt in agreement with poc.     OT Education - 06/30/21 0855     Education Details OT role, goals, poc    Person(s) Educated Patient    Methods Explanation;Verbal cues    Comprehension Verbalized understanding;Verbal cues required;Need further instruction              OT Short Term Goals - 06/30/21 0858       OT SHORT TERM GOAL #1   Title Pt will be indep with HEP for increasing strength and coordination in  bilat hands    Baseline Eval: not yet initiated    Time 3    Period Weeks    Status New    Target Date 07/19/21               OT Long Term Goals - 06/30/21 0859       OT LONG TERM GOAL #1   Title Pt will increase FOTO score by 2 or more points to indicate increased functional performance    Baseline Eval: FOTO: 58    Time 6    Period Weeks    Status New    Target Date 08/09/21      OT LONG TERM GOAL #2   Title Pt will increase bilat grip strength by 5 or more lbs to enable improved ability to hold and carry ADL supplies.    Baseline Eval: R grip 64, L 36    Time 6    Period Weeks    Status New    Target Date 08/09/21      OT LONG TERM GOAL #3   Title Pt will increase dexterity bilaterally to enable indep with clothing fasteners.    Baseline Eval: spouse assists with small buttons (pt has new button hook but has not yet  used).    Time 6    Period Weeks    Status New    Target Date 08/09/21      OT LONG TERM GOAL #4   Title Pt will verbalize/demo 2 compensatory strategies to manage tremors to ease self feeding tasks.    Baseline Eval: education not yet intiated; pt may benefit from swivel spoon d/t reported difficulty with spoon    Time 6    Period Weeks    Status New    Target Date 08/09/21      OT LONG TERM GOAL #5   Title Pt will verbalize 1-2 strategies to manage dupuytren's contracture in bilat hands.    Baseline Eval: education not yet initiated    Time 6    Period Weeks    Status New    Target Date 08/09/21               Plan - 06/30/21 0856     Clinical Impression Statement Pt is a 76 y/o male who presents to outpatient Occupational Therapy d/t reported decline in hand strength.  Pt has noted dupuytren's contracture in bilat hands, L worse than R, mild essential tremors which impact self feeding accuracy, decreased dexterity affecting ability to manage small buttons and pick up small ADL supplies from table top.  Pt's goal is to improve strength and coordination in bilat hands.  OT planning 2w6 to address above noted deficits.  Pt in agreement with poc.    OT Occupational Profile and History Problem Focused Assessment - Including review of records relating to presenting problem    Occupational performance deficits (Please refer to evaluation for details): ADL's;IADL's    Body Structure / Function / Physical Skills ADL;Coordination;UE functional use;Balance;Body mechanics;Decreased knowledge of use of DME;Flexibility;IADL;Pain;Dexterity;FMC;Strength;ROM    Rehab Potential Good    Clinical Decision Making Limited treatment options, no task modification necessary    Comorbidities Affecting Occupational Performance: May have comorbidities impacting occupational performance    Modification or Assistance to Complete Evaluation  No modification of tasks or assist necessary to complete eval     OT Frequency 2x / week    OT Duration 6 weeks    OT Treatment/Interventions Self-care/ADL training;Paraffin;Therapeutic exercise;DME and/or  AE instruction;Balance training;Neuromuscular education;Moist Heat;Passive range of motion;Therapeutic activities;Patient/family education    Plan OT to address hand strength and coordination; pt currently seeing PT for LE strength, balance, and mobility    OT Home Exercise Plan not yet initiated    Consulted and Agree with Plan of Care Patient             Patient will benefit from skilled therapeutic intervention in order to improve the following deficits and impairments:   Body Structure / Function / Physical Skills: ADL, Coordination, UE functional use, Balance, Body mechanics, Decreased knowledge of use of DME, Flexibility, IADL, Pain, Dexterity, FMC, Strength, ROM       Visit Diagnosis: Muscle weakness (generalized)  Other lack of coordination    Problem List Patient Active Problem List   Diagnosis Date Noted   Iron deficiency anemia 07/16/2019   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 07/16/2019   Serum potassium elevated 07/16/2019   High risk medications (not anticoagulants) long-term use 07/16/2019   Elevated LDL cholesterol level 06/25/2018   Healthcare maintenance 12/13/2017   Encounter for screening for malignant neoplasm of respiratory organs 07/12/2017   Overgrown toenails 07/12/2017   Vitamin D insufficiency 04/28/2017   Closed fracture of one rib of left side 04/28/2017   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 ([place]) 04/28/2017   Diabetes mellitus due to underlying condition without complication, with long-term current use of insulin ([facility]) 04/06/2017   Elbow swelling, left 04/06/2017   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[place]) 04/06/2017   Fatigue 04/06/2017   Hypertension associated with diabetes ([place]) 04/06/2017   Screening for AAA (abdominal aortic aneurysm) 04/06/2017   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

## 2021-07-01 ENCOUNTER — Ambulatory Visit: Payer: Medicare HMO

## 2021-07-01 ENCOUNTER — Other Ambulatory Visit: Payer: Self-pay

## 2021-07-01 ENCOUNTER — Ambulatory Visit: Payer: Medicare HMO | Attending: Internal Medicine

## 2021-07-01 DIAGNOSIS — R262 Difficulty in walking, not elsewhere classified: Secondary | ICD-10-CM | POA: Diagnosis present

## 2021-07-01 DIAGNOSIS — R278 Other lack of coordination: Secondary | ICD-10-CM | POA: Insufficient documentation

## 2021-07-01 DIAGNOSIS — M6281 Muscle weakness (generalized): Secondary | ICD-10-CM | POA: Insufficient documentation

## 2021-07-01 DIAGNOSIS — R269 Unspecified abnormalities of gait and mobility: Secondary | ICD-10-CM | POA: Insufficient documentation

## 2021-07-01 DIAGNOSIS — R2681 Unsteadiness on feet: Secondary | ICD-10-CM | POA: Diagnosis present

## 2021-07-01 NOTE — Therapy (Signed)
[place] MAIN [facility] SERVICES [address] [place], [place], [address] Phone: [phone]   Fax:  [phone]  Physical Therapy Treatment  Patient Details  Name: Dustin Ferrell MRN: [id number] Date of Birth: Dec 15, 1945 Referring Provider (PT): Dr. Joselyn Arrow   Encounter Date: 07/01/2021   PT End of Session - 07/01/21 1126     Visit Number 21    Number of Visits 25    Date for PT Re-Evaluation 08/04/21    Authorization Type [employer]    Authorization Time Period 05/12/21-08/04/21    Progress Note Due on Visit 20    PT Start Time 0844    PT Stop Time 0929    PT Time Calculation (min) 45 min    Equipment Utilized During Treatment Gait belt    Activity Tolerance Patient tolerated treatment well    Behavior During Therapy [facility] for tasks assessed/performed             Past Medical History:  Diagnosis Date   Anxiety    associated with frontal temporal behavioral dementia   Basal cell carcinoma 06/03/2021   Left nasal ala - needs treatment   Dementia with behavioral disturbance    Diabetes mellitus without complication ([place])    History of traumatic head injury    Hypertension     Past Surgical History:  Procedure Laterality Date   Eye Socket fracture repair Right    HERNIA REPAIR     ROTATOR CUFF REPAIR Left    TONSILLECTOMY     WRIST FRACTURE SURGERY Right     There were no vitals filed for this visit.   Subjective Assessment - 07/01/21 1122     Subjective Patient reports doing well this morning- Not in any pain and just some tightness in my hips- Denies any falls. Reports his wife is leaving next week for a vacation in [place] and that his sister is coming down to stay with him.    Patient is accompained by: Family member    Pertinent History Jamill Wetmore is a 45yoM who is referred to OPPT for imbalance and falls- pt also followed by Dr. Alvan Dame orthopedics for acute on chronic Left hip pain. Pt is followed by Dr. Manuella Ghazi c  neurology. PMH: TBI (1981) c Rt frontal lobe involvement on valproic acid, seizures (none since 2017) on Depakote, ETOH use in remission since July 2022, Smoking, DM c diabetic neuropathy, OSA with CPAP noncompliance, Orthostatic hypotension dysautonomic syndrome, syncope.    Limitations Standing;Walking;House hold activities    How long can you sit comfortably? no restriction    How long can you stand comfortably? 15 min    How long can you walk comfortably? 10 min    Patient Stated Goals I want to improve my balance and not fall    Currently in Pain? No/denies    Pain Onset More than a month ago           Therex:   Dynamic warm -up:   Lumbar flex/ext (slow and steady) x 12 reps Calf raises into toe raises x 15 reps Side marches (hillbilly marching) x 15 reps   Sit to stand from hard back chair without UE support (arms crossed) x 10 reps Sit to stand with 1 foot on airex pad to bias power to opp LE x 5 reps each leg.    Neuro Re-ed:   Step tap (12" block) with 1 UE support x 10 reps. 2nd set without UE support x  10 reps alt LE's.   Staggered stance (back leg on airex pad and front leg on 12"block)  - hold 30 sec then switch feet x 2 trials each.   Dual task activity- Side stepping in hallway (approx 25 feet) and bouncing ball to himself to right and then 25 feet back to left.   Dual Task- Hang man game x 4:   1) standing with narrow based feet on airex pad- No issues or LOB 2) standing with staggered feet on airex pad - No LOB 3) standing with staggered feet on airex pad- Mild increase difficulty with left foot in back.  4) standing with staggered feet on airex pad- No LOB  Dual task with resistive gait: 12.5 lb resistance Forward- naming farm animals -Some pausing noted when incorporating dual task Forward-name of ocean animals Backward- name of famous golfers Backward- things associated with golf   Education provided throughout session via VC/TC and demonstration to  facilitate movement at target joints and correct muscle activation for all testing and exercises performed.                        PT Education - 07/01/21 1124     Education Details Exercise technique    Person(s) Educated Patient    Methods Explanation;Demonstration;Tactile cues;Verbal cues    Comprehension Verbalized understanding;Returned demonstration;Verbal cues required;Tactile cues required;Need further instruction              PT Short Term Goals - 05/12/21 1040       PT SHORT TERM GOAL #1   Title Pt will be independent with HEP in order to improve strength and balance in order to decrease fall risk and improve function at home and work.    Baseline 11/15: not doing HEP right now; 12/5:  doing HEP with help of spouse every day, but without spouse has difficulty with compliance. 05/13/2021- Due to some mild cognitive issues- this goal is not attainable as he requires some supervision with home program.    Time 6    Period Weeks    Status Unable to assess    Target Date 04/01/21               PT Long Term Goals - 06/29/21 1607       PT LONG TERM GOAL #1   Title Pt will improve FOTO to target score of 60 display perceived improvements in ability to complete ADL's.    Baseline 02/18/2021= 55%, 12/5: 46%; 06/29/2021= 57%    Time 12    Period Weeks    Status On-going    Target Date 08/04/21      PT LONG TERM GOAL #2   Title Pt will decrease 5TSTS by at least 3 seconds in order to demonstrate clinically significant improvement in LE strength.    Baseline 02/18/2021= 15 sec, 11/15: 17.3 sec, 12/5: 11.19s with BUE support, 14.55s no UE support; 05/12/2021= 11:35 sec    Time 12    Period Weeks    Status Achieved    Target Date 05/13/21      PT LONG TERM GOAL #3   Title Patient will increase Functional Gait Assessment score to >22/30 as to reduce fall risk and improve dynamic gait safety with community ambulation.    Baseline 02/18/2021= 17/30,  11/15: 14/30, 12/5: 13/30; 12/14= 18/30; 06/29/2021= 19/30    Time 12    Period Weeks    Status On-going    Target Date 08/04/21  PT LONG TERM GOAL #4   Title Pt will increase 10MWT by at least 0.13 m/s in order to demonstrate clinically significant improvement in community ambulation.    Baseline 02/18/2021= 0.89 m/s without AD, 11/15: 0.97 m/s, 12/5: 1.15m/s, no AD; 06/29/2021= 1.15 m/s without an AD    Time 12    Period Weeks    Status Achieved    Target Date 08/04/21      PT LONG TERM GOAL #5   Title Patient will report decreased left side buttock pain to less than 4/10 with transfers/mobility for improved functional mobility in home and community.    Baseline 05/12/2021= 6/10 left gluteal pain.; 06/29/2021- Patient states his gluteal pain has much improved and only rates as "sore" at times but not really painful.    Time 12    Period Weeks    Status Achieved                   Plan - 07/01/21 1126     Clinical Impression Statement Patient presents today with good motivation for session. He was receptive to [facility] for balance and able to perform several dual tasks activities- performing better with more physical task yet difficulty with more cognitive task/Recall. He did improve with practice today and will benefit from further skilled PT to address these deficits in order to improve safety and independence with ambulation and functional mobility    Personal Factors and Comorbidities Age;Comorbidity 3+    Comorbidities remote TBI, frontal dementia, DM    Examination-Activity Limitations Carry;Other;Locomotion Level    Examination-Participation Restrictions Community Activity;Yard Work    Stability/Clinical Decision Making Stable/Uncomplicated    Rehab Potential Good    PT Frequency 2x / week    PT Duration 12 weeks    PT Treatment/Interventions ADLs/Self Care Home Management;Cryotherapy;Moist Heat;DME Instruction;Gait training;Stair training;Functional mobility  training;Therapeutic activities;Therapeutic exercise;Balance training;Neuromuscular re-education;Patient/family education;Manual techniques;Passive range of motion;Dry needling;Vestibular;Canalith Repostioning;Spinal Manipulations;Ultrasound    PT Next Visit Plan Continue with progressive LE Strengthening and balance activities for improved mobility static and dynamic balance; continue POC as previously indicated; Continue with manual therapy for pain relief and tightness in low back/gluteal region    PT Home Exercise Plan Access Code: [email or web address]; 04/15/2021=Access Code: [email or web address]; no udpates    Consulted and Agree with Plan of Care Patient;Family member/caregiver    Family Member Consulted wife             Patient will benefit from skilled therapeutic intervention in order to improve the following deficits and impairments:  Abnormal gait, Decreased activity tolerance, Decreased balance, Decreased coordination, Decreased cognition, Decreased mobility, Decreased safety awareness, Decreased strength, Impaired perceived functional ability  Visit Diagnosis: Abnormality of gait and mobility  Difficulty in walking, not elsewhere classified  Muscle weakness (generalized)  Unsteadiness on feet     Problem List Patient Active Problem List   Diagnosis Date Noted   Iron deficiency anemia 07/16/2019   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 07/16/2019   Serum potassium elevated 07/16/2019   High risk medications (not anticoagulants) long-term use 07/16/2019   Elevated LDL cholesterol level 06/25/2018   Healthcare maintenance 12/13/2017   Encounter for screening for malignant neoplasm of respiratory organs 07/12/2017   Overgrown toenails 07/12/2017   Vitamin D insufficiency 04/28/2017   Closed fracture of one rib of left side 04/28/2017   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 ([place]) 04/28/2017   Diabetes mellitus due to underlying condition without complication, with long-term  current use of insulin ([place]) 04/06/2017  Elbow swelling, left 04/06/2017   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[place]) 04/06/2017   Fatigue 04/06/2017   Hypertension associated with diabetes ([place]) 04/06/2017   Screening for AAA (abdominal aortic aneurysm) 04/06/2017   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

## 2021-07-01 NOTE — Therapy (Signed)
[place] MAIN [facility] SERVICES [address] [place], [place], [address] Phone: [phone]   Fax:  [phone]  Occupational Therapy Treatment  Patient Details  Name: Dustin Ferrell MRN: [id number] Date of Birth: 06/17/1945 Referring Provider (OT): Dr. Manuella Ghazi   Encounter Date: 07/01/2021   OT End of Session - 07/01/21 1646     Visit Number 2    Number of Visits 12    Date for OT Re-Evaluation 08/10/21    OT Start Time 0930    OT Stop Time 1013    OT Time Calculation (min) 43 min    Activity Tolerance Patient tolerated treatment well    Behavior During Therapy [facility] for tasks assessed/performed             Past Medical History:  Diagnosis Date   Anxiety    associated with frontal temporal behavioral dementia   Basal cell carcinoma 06/03/2021   Left nasal ala - needs treatment   Dementia with behavioral disturbance    Diabetes mellitus without complication ([place])    History of traumatic head injury    Hypertension     Past Surgical History:  Procedure Laterality Date   Eye Socket fracture repair Right    HERNIA REPAIR     ROTATOR CUFF REPAIR Left    TONSILLECTOMY     WRIST FRACTURE SURGERY Right     There were no vitals filed for this visit.   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 No/denies    Pain Score 0-No pain    Pain Onset More than a month ago            Occupational Therapy Treatment: Therapeutic Exercise: Facilitated hand strengthening with use of hand gripper set at 11.2# R/L for first trial to remove jumbo  pegs from pegboard.  Completed a 2nd trial with hand gripper set at 17.9 # on R hand, and kept to 11/2# for L hand with good tolerance.  Pt required cues to slow pace and correct positioning of gripper in hand to reduce dropping pegs.  Issued pink theraputty and instructed pt in strengthening and coordination exercises for R/L hand, including gross grasping, lateral/2 point/3 point pinching, digit abd/add, and digging coins out of putty.  Able to return demo with mod vc for technique to improve quality of movement and follow handout.  Encouraged completion 5-10 min, 1-2x per day.  Reinforced keeping sessions with putty to 10 min or less to avoid overactivity which may cause pain in hands from arthritis joints.  Instructed pt in gentle stretching for MP and PIP joints with passive finger lifts from table top (each hand), active digit abd/add, hand taps, and flat hand stretch on table top to tolerance to stretch hands to address Dupuytren's contractures.  Reinforced to avoid stretching to the point of pain, only to a gentle and comfortable stretch.  Pt verbalized understanding.  Response to Treatment: Good tolerance to therapeutic exercises this day.  Pt requires reinforcement to rest, stretch, and pace self with exercises as to avoid over activity in regards to arthritis  and dupuytren's contractures in bilat hands.  Pt requires mod vc to follow written handout from [place] for putty exercises.  Pt performs all exercises impulsively and requires cues to slow pace for better accuracy as well as to terminate exercises at the end of a certain amount of reps; pt otherwise performs ongoing exercises and requires cues to rest.  OT issued swivel spoon and offered demo; encouraged pt to try spoon at home for management of tremors during self feeding tasks.  Pt will continue to benefit from skilled OT for HEP progression and strategies to maximize indep with ADLs.      OT Education - 07/01/21 1646     Education  Details theraputty exercises    Person(s) Educated Patient    Methods Explanation;Verbal cues;Handout;Tactile cues;Demonstration    Comprehension Verbalized understanding;Verbal cues required;Need further instruction;Returned demonstration              OT Short Term Goals - 06/30/21 0858       OT SHORT TERM GOAL #1   Title Pt will be indep with HEP for increasing strength and coordination in bilat hands    Baseline Eval: not yet initiated    Time 3    Period Weeks    Status New    Target Date 07/19/21               OT Long Term Goals - 06/30/21 0859       OT LONG TERM GOAL #1   Title Pt will increase FOTO score by 2 or more points to indicate increased functional performance    Baseline Eval: FOTO: 58    Time 6    Period Weeks    Status New    Target Date 08/09/21      OT LONG TERM GOAL #2   Title Pt will increase bilat grip strength by 5 or more lbs to enable improved ability to hold and carry ADL supplies.    Baseline Eval: R grip 64, L 36    Time 6    Period Weeks    Status New    Target Date 08/09/21      OT LONG TERM GOAL #3   Title Pt will increase dexterity bilaterally to enable indep with clothing fasteners.    Baseline Eval: spouse assists with small buttons (pt has new button hook but has not yet used).    Time 6    Period Weeks    Status New    Target Date 08/09/21      OT LONG TERM GOAL #4   Title Pt will verbalize/demo 2 compensatory strategies to manage tremors to ease self feeding tasks.    Baseline Eval: education not yet intiated; pt may benefit from swivel spoon d/t reported difficulty with spoon    Time 6    Period Weeks    Status New    Target Date 08/09/21      OT LONG TERM GOAL #5   Title Pt will verbalize 1-2 strategies to manage dupuytren's contracture in bilat hands.    Baseline Eval: education not yet initiated    Time 6    Period Weeks    Status New    Target Date 08/09/21                   Plan - 07/01/21  1700     Clinical Impression Statement Good tolerance to therapeutic exercises this day.  Pt requires reinforcement to rest, stretch, and pace self  with exercises as to avoid over activity in regards to arthritis and dupuytren's contractures in bilat hands.  Pt requires mod vc to follow written handout from [place] for putty exercises.  Pt performs all exercises impulsively and requires cues to slow pace for better accuracy as well as to terminate exercises at the end of a certain amount of reps; pt otherwise performs ongoing exercises and requires cues to rest.  OT issued swivel spoon and offered demo; encouraged pt to try spoon at home for management of tremors during self feeding tasks.  Pt will continue to benefit from skilled OT for HEP progression and strategies to maximize indep with ADLs.    OT Occupational Profile and History Problem Focused Assessment - Including review of records relating to presenting problem    Occupational performance deficits (Please refer to evaluation for details): ADL's;IADL's    Body Structure / Function / Physical Skills ADL;Coordination;UE functional use;Balance;Body mechanics;Decreased knowledge of use of DME;Flexibility;IADL;Pain;Dexterity;FMC;Strength;ROM    Rehab Potential Good    Clinical Decision Making Limited treatment options, no task modification necessary    Comorbidities Affecting Occupational Performance: May have comorbidities impacting occupational performance    Modification or Assistance to Complete Evaluation  No modification of tasks or assist necessary to complete eval    OT Frequency 2x / week    OT Duration 6 weeks    OT Treatment/Interventions Self-care/ADL training;Paraffin;Therapeutic exercise;DME and/or AE instruction;Balance training;Neuromuscular education;Moist Heat;Passive range of motion;Therapeutic activities;Patient/family education    Plan OT to address hand strength and coordination; pt currently seeing PT for LE strength,  balance, and mobility    OT Home Exercise Plan not yet initiated    Consulted and Agree with Plan of Care Patient             Patient will benefit from skilled therapeutic intervention in order to improve the following deficits and impairments:   Body Structure / Function / Physical Skills: ADL, Coordination, UE functional use, Balance, Body mechanics, Decreased knowledge of use of DME, Flexibility, IADL, Pain, Dexterity, FMC, Strength, ROM       Visit Diagnosis: Muscle weakness (generalized)  Other lack of coordination    Problem List Patient Active Problem List   Diagnosis Date Noted   Iron deficiency anemia 07/16/2019   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 07/16/2019   Serum potassium elevated 07/16/2019   High risk medications (not anticoagulants) long-term use 07/16/2019   Elevated LDL cholesterol level 06/25/2018   Healthcare maintenance 12/13/2017   Encounter for screening for malignant neoplasm of respiratory organs 07/12/2017   Overgrown toenails 07/12/2017   Vitamin D insufficiency 04/28/2017   Closed fracture of one rib of left side 04/28/2017   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 ([place]) 04/28/2017   Diabetes mellitus due to underlying condition without complication, with long-term current use of insulin (HCC) 04/06/2017   Elbow swelling, left 04/06/2017   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[place]) 04/06/2017   Fatigue 04/06/2017   Hypertension associated with diabetes ([place]) 04/06/2017   Screening for AAA (abdominal aortic aneurysm) 04/06/2017   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

## 2021-07-06 ENCOUNTER — Ambulatory Visit: Payer: Medicare HMO

## 2021-07-06 ENCOUNTER — Other Ambulatory Visit: Payer: Self-pay

## 2021-07-06 DIAGNOSIS — R278 Other lack of coordination: Secondary | ICD-10-CM

## 2021-07-06 DIAGNOSIS — M6281 Muscle weakness (generalized): Secondary | ICD-10-CM

## 2021-07-06 DIAGNOSIS — R2681 Unsteadiness on feet: Secondary | ICD-10-CM

## 2021-07-06 DIAGNOSIS — R269 Unspecified abnormalities of gait and mobility: Secondary | ICD-10-CM

## 2021-07-06 DIAGNOSIS — R262 Difficulty in walking, not elsewhere classified: Secondary | ICD-10-CM

## 2021-07-06 NOTE — Therapy (Signed)
[place] MAIN [facility] SERVICES [place], [place], [address] Phone: [phone]   Fax:  [phone]  Occupational Therapy Treatment  Patient Details  Name: Dustin Ferrell MRN: [id number] Date of Birth: 1946/01/12 Referring Provider (OT): Dr. Manuella Ghazi   Encounter Date: 07/06/2021   OT End of Session - 07/06/21 1610     Visit Number 3    Number of Visits 12    Date for OT Re-Evaluation 08/10/21    OT Start Time 1515    OT Stop Time 1559    OT Time Calculation (min) 44 min    Activity Tolerance Patient tolerated treatment well    Behavior During Therapy [facility] for tasks assessed/performed             Past Medical History:  Diagnosis Date   Anxiety    associated with frontal temporal behavioral dementia   Basal cell carcinoma 06/03/2021   Left nasal ala - needs treatment   Dementia with behavioral disturbance    Diabetes mellitus without complication ([place])    History of traumatic head injury    Hypertension     Past Surgical History:  Procedure Laterality Date   Eye Socket fracture repair Right    HERNIA REPAIR     ROTATOR CUFF REPAIR Left    TONSILLECTOMY     WRIST FRACTURE SURGERY Right     There were no vitals filed for this visit.   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 Yes    Pain Score 3     Pain Location Hip    Pain Orientation Left    Pain Descriptors / Indicators Sore    Pain Type Chronic pain    Pain Onset More than a month ago    Pain Frequency Intermittent    Aggravating Factors  worse with transfers/walking    Pain Relieving Factors  stretching    Effect of Pain on Daily Activities difficulty walking    Multiple Pain Sites Yes    Pain Score 2    Pain Location Hand    Pain Orientation Right;Left    Pain Descriptors / Indicators Aching;Sore    Pain Type Chronic pain    Pain Onset More than a month ago    Pain Frequency Intermittent    Aggravating Factors  FMC, gripping    Pain Relieving Factors rest, heat, meds    Effect of Pain on Daily Activities discomfort with FMC/gripping activities            Occupational Therapy Treatment: [place]: Bilat hands x5 min for pain reduction/muscle relaxation in prep for therapeutic exercise.  Therapeutic Exercise: Participated in active digit abduction x2 sets 10 reps each and gentle digit extension stretch on table top.  Worked with Event organiser using R/L hands, removing pieces with tweezers.  Moved pieces to washcloth d/t difficulty picking pieces up from dish.   Self Care: Worked with unbuttoning/buttoning small buttons at top of polo shirt.  Extra time to unbutton, significant difficulty buttoning.  Pt states he's getting better with using his button hook.  Had pt practice with  button hook during session and pt struggled using it on his shift buttons, so transitioned to practice shirt on table top with OT providing demo for button hook technique.  Pt buttoned all buttons on shirt on table top x2 trials, then transitioned to use of button hook to button 2 buttons on his own shirt with better performance, less time after practice trials.    Response to Treatment: See Plan/clinical impression below.    OT Education - 07/06/21 1537     Education Details HEP frequency (no more than 1-2x per day)    Person(s) Educated Patient    Methods Explanation;Verbal cues;Handout;Tactile cues;Demonstration    Comprehension Verbalized understanding;Verbal cues required;Need further instruction;Returned demonstration              OT Short Term Goals - 06/30/21 0858        OT SHORT TERM GOAL #1   Title Pt will be indep with HEP for increasing strength and coordination in bilat hands    Baseline Eval: not yet initiated    Time 3    Period Weeks    Status New    Target Date 07/19/21               OT Long Term Goals - 06/30/21 0859       OT LONG TERM GOAL #1   Title Pt will increase FOTO score by 2 or more points to indicate increased functional performance    Baseline Eval: FOTO: 58    Time 6    Period Weeks    Status New    Target Date 08/09/21      OT LONG TERM GOAL #2   Title Pt will increase bilat grip strength by 5 or more lbs to enable improved ability to hold and carry ADL supplies.    Baseline Eval: R grip 64, L 36    Time 6    Period Weeks    Status New    Target Date 08/09/21      OT LONG TERM GOAL #3   Title Pt will increase dexterity bilaterally to enable indep with clothing fasteners.    Baseline Eval: spouse assists with small buttons (pt has new button hook but has not yet used).    Time 6    Period Weeks    Status New    Target Date 08/09/21      OT LONG TERM GOAL #4   Title Pt will verbalize/demo 2 compensatory strategies to manage tremors to ease self feeding tasks.    Baseline Eval: education not yet intiated; pt may benefit from swivel spoon d/t reported difficulty with spoon    Time 6    Period Weeks    Status New    Target Date 08/09/21      OT LONG TERM GOAL #5   Title Pt will verbalize 1-2 strategies to manage dupuytren's contracture in bilat hands.    Baseline Eval: education not yet initiated    Time 6    Period Weeks    Status New    Target Date 08/09/21             Plan - 07/06/21 1634     Clinical Impression Statement Pt reported soreness in hands after gripping exercises last session, mild pain today.  Tolerated paraffin wax to bilat hands well in prep for [facility] exercises and functional activity.  OT reinforced recommendation to perform putty exercises no more than 1-2 x per day.  Pt  admitted  he had sometimes used it 3 or more.  OT encouraged fewer reps/lower frequency with putty use to reduce repetitive strain on joints.  Pt verbalized understanding.  Pt required non-skid surface to pick up [employer] pegboard pieces after struggling to pick up pieces from dish.  Difficulty with shirt buttons this day, but improved after demo and trials with button hook.  Pt will continue to benefit from skilled OT for HEP instruction, [place] and strengthening, and ADL training in order to maximize indep with daily tasks.    OT Occupational Profile and History Problem Focused Assessment - Including review of records relating to presenting problem    Occupational performance deficits (Please refer to evaluation for details): ADL's;IADL's    Body Structure / Function / Physical Skills ADL;Coordination;UE functional use;Balance;Body mechanics;Decreased knowledge of use of DME;Flexibility;IADL;Pain;Dexterity;FMC;Strength;ROM    Rehab Potential Good    Clinical Decision Making Limited treatment options, no task modification necessary    Comorbidities Affecting Occupational Performance: May have comorbidities impacting occupational performance    Modification or Assistance to Complete Evaluation  No modification of tasks or assist necessary to complete eval    OT Frequency 2x / week    OT Duration 6 weeks    OT Treatment/Interventions Self-care/ADL training;Paraffin;Therapeutic exercise;DME and/or AE instruction;Balance training;Neuromuscular education;Moist Heat;Passive range of motion;Therapeutic activities;Patient/family education    Plan OT to address hand strength and coordination; pt currently seeing PT for LE strength, balance, and mobility    OT Home Exercise Plan not yet initiated    Consulted and Agree with Plan of Care Patient             Patient will benefit from skilled therapeutic intervention in order to improve the following deficits and impairments:   Body Structure / Function /  Physical Skills: ADL, Coordination, UE functional use, Balance, Body mechanics, Decreased knowledge of use of DME, Flexibility, IADL, Pain, Dexterity, FMC, Strength, ROM       Visit Diagnosis: Muscle weakness (generalized)  Other lack of coordination    Problem List Patient Active Problem List   Diagnosis Date Noted   Iron deficiency anemia 07/16/2019   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 07/16/2019   Serum potassium elevated 07/16/2019   High risk medications (not anticoagulants) long-term use 07/16/2019   Elevated LDL cholesterol level 06/25/2018   Healthcare maintenance 12/13/2017   Encounter for screening for malignant neoplasm of respiratory organs 07/12/2017   Overgrown toenails 07/12/2017   Vitamin D insufficiency 04/28/2017   Closed fracture of one rib of left side 04/28/2017   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 ([place]) 04/28/2017   Diabetes mellitus due to underlying condition without complication, with long-term current use of insulin (HCC) 04/06/2017   Elbow swelling, left 04/06/2017   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[place]) 04/06/2017   Fatigue 04/06/2017   Hypertension associated with diabetes ([place]) 04/06/2017   Screening for AAA (abdominal aortic aneurysm) 04/06/2017   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 [place], [place], [address] Phone: [phone]   Fax:  [phone]  Name: Dustin Ferrell MRN: [id number] Date of Birth: [birth year]

## 2021-07-06 NOTE — Therapy (Signed)
[place] MA[facility] SERVICES [place], [place], [address] Phone: [phone]   Fax:  [phone]  Physical Therapy Treatment  Patient Details  Name: Dustin Ferrell MRN: [id number] Date of Birth: Sep 18, 1945 Referring Provider (PT): Dr. Joselyn Arrow   Encounter Date: 07/06/2021   PT End of Session - 07/06/21 1601     Visit Number 22    Number of Visits 25    Date for PT Re-Evaluation 08/04/21    Authorization Type [employer]    Authorization Time Period 05/12/21-08/04/21    Progress Note Due on Visit 20    PT Start Time 1600    PT Stop Time 1640    PT Time Calculation (min) 40 min    Equipment Utilized During Treatment Gait belt    Activity Tolerance Patient tolerated treatment well    Behavior During Therapy [facility] for tasks assessed/performed             Past Medical History:  Diagnosis Date   Anxiety    associated with frontal temporal behavioral dementia   Basal cell carcinoma 06/03/2021   Left nasal ala - needs treatment   Dementia with behavioral disturbance    Diabetes mellitus without complication ([place])    History of traumatic head injury    Hypertension     Past Surgical History:  Procedure Laterality Date   Eye Socket fracture repair Right    HERNIA REPAIR     ROTATOR CUFF REPAIR Left    TONSILLECTOMY     WRIST FRACTURE SURGERY Right     There were no vitals filed for this visit.   Subjective Assessment - 07/06/21 1600     Subjective Patient reports doing well- denies any pain today and no falls. States he is doing more around the home.    Patient is accompained by: Family member    Pertinent History Dustin Ferrell is a 48yoM who is referred to OPPT for imbalance and falls- pt also followed by Dr. Alvan Dame orthopedics for acute on chronic Left hip pain. Pt is followed by Dr. Manuella Ghazi c neurology. 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    Limitations Standing;Walking;House hold activities    How long can you sit comfortably? no restriction    How long can you stand comfortably? 15 min    How long can you walk comfortably? 10 min    Patient Stated Goals I want to improve my balance and not fall    Currently in Pain? No/denies              INTERVENTIONS:   Neuromuscular re-ed:   Dual Task- Hang man game x 4:    1) standing with narrow based feet on airex pad- No issues or LOB 2) standing with feet on 1/2 foam (curve side up)  - increased unsteadiness.  3) standing with tandem stance on narrow 2" step block- Mild increase difficulty with left foot in back.  4) standing with staggered feet on airex pad- No LOB  Dual task activity- Side stepping in hallway (approx 25 feet) and bouncing ball to himself to right and then 25 feet back to left. No LOB today - Decreased cadence initial yet improved with practice today.   Side step over 2 " step stool x 15 reps  without UE support Forward/backward stepping up/over 2" step stool x 15 reps   Cone work 1) weave - figure 8 around cones 2) combo of forward/side/backward step in/around cones  (6) - Patient exhibited increased difficulty coordinating movement around cones - did improve with VC and practice.   Education provided throughout session via VC/TC and demonstration to facilitate movement at target joints and correct muscle activation for all testing and exercises performed.                     PT Education - 07/06/21 1601     Education Details Exercise technique    Person(s) Educated Patient    Methods Explanation;Demonstration;Tactile cues;Verbal cues    Comprehension Verbalized understanding;Returned demonstration;Verbal cues required;Tactile cues required;Need further instruction               PT Short Term Goals - 05/12/21 1040       PT SHORT TERM GOAL #1   Title Pt will be independent with HEP in order to improve strength and balance in order to decrease fall risk and improve function at home and work.    Baseline 11/15: not doing HEP right now; 12/5:  doing HEP with help of spouse every day, but without spouse has difficulty with compliance. 05/13/2021- Due to some mild cognitive issues- this goal is not attainable as he requires some supervision with home program.    Time 6    Period Weeks    Status Unable to assess    Target Date 04/01/21               PT Long Term Goals - 06/29/21 1607       PT LONG TERM GOAL #1   Title Pt will improve FOTO to target score of 60 display perceived improvements in ability to complete ADL's.    Baseline 02/18/2021= 55%, 12/5: 46%; 06/29/2021= 57%    Time 12    Period Weeks    Status On-going    Target Date 08/04/21      PT LONG TERM GOAL #2   Title Pt will decrease 5TSTS by at least 3 seconds in order to demonstrate clinically significant improvement in LE strength.    Baseline 02/18/2021= 15 sec, 11/15: 17.3 sec, 12/5: 11.19s with BUE support, 14.55s no UE support; 05/12/2021= 11:35 sec    Time 12    Period Weeks    Status Achieved    Target Date 05/13/21      PT LONG TERM GOAL #3   Title Patient will increase Functional Gait Assessment score to >22/30 as to reduce fall risk and improve dynamic gait safety with community ambulation.    Baseline 02/18/2021= 17/30, 11/15: 14/30, 12/5: 13/30; 12/14= 18/30; 06/29/2021= 19/30    Time 12    Period Weeks    Status On-going    Target Date 08/04/21      PT LONG TERM GOAL #4   Title Pt will increase 10MWT by at least 0.13 m/s in order to demonstrate clinically significant improvement in community ambulation.    Baseline 02/18/2021= 0.89 m/s without AD, 11/15: 0.97 m/s, 12/5: 1.78m/s, no AD; 06/29/2021= 1.15 m/s without an AD    Time 12    Period Weeks    Status Achieved     Target Date 08/04/21      PT LONG TERM GOAL #5   Title Patient will report decreased left side buttock pain to less than 4/10 with transfers/mobility for improved functional mobility in  home and community.    Baseline 05/12/2021= 6/10 left gluteal pain.; 06/29/2021- Patient states his gluteal pain has much improved and only rates as "sore" at times but not really painful.    Time 12    Period Weeks    Status Achieved                   Plan - 07/06/21 1602     Clinical Impression Statement Patient performed all activities well- Improving balance while performing dual task making the balance more automatic- He was challenged with more advanced foot position/surfaces. He did improve with dual task activities compared to last visit yet still challenged with cone activities related to coordination. He did improve with practice today and will benefit from further skilled PT to address these deficits in order to improve safety and independence with ambulation and functional mobility.    Personal Factors and Comorbidities Age;Comorbidity 3+    Comorbidities remote TBI, frontal dementia, DM    Examination-Activity Limitations Carry;Other;Locomotion Level    Examination-Participation Restrictions Community Activity;Yard Work    Stability/Clinical Decision Making Stable/Uncomplicated    Rehab Potential Good    PT Frequency 2x / week    PT Duration 12 weeks    PT Treatment/Interventions ADLs/Self Care Home Management;Cryotherapy;Moist Heat;DME Instruction;Gait training;Stair training;Functional mobility training;Therapeutic activities;Therapeutic exercise;Balance training;Neuromuscular re-education;Patient/family education;Manual techniques;Passive range of motion;Dry needling;Vestibular;Canalith Repostioning;Spinal Manipulations;Ultrasound    PT Next Visit Plan Continue with progressive LE Strengthening and balance activities for improved mobility static and dynamic balance; continue POC as  previously indicated; Continue with manual therapy for pain relief and tightness in low back/gluteal region    PT Home Exercise Plan Access Code: [email or web address]; 04/15/2021=Access Code: [email or web address]; no udpates    Consulted and Agree with Plan of Care Patient;Family member/caregiver    Family Member Consulted wife             Patient will benefit from skilled therapeutic intervention in order to improve the following deficits and impairments:  Abnormal gait, Decreased activity tolerance, Decreased balance, Decreased coordination, Decreased cognition, Decreased mobility, Decreased safety awareness, Decreased strength, Impaired perceived functional ability  Visit Diagnosis: Abnormality of gait and mobility  Difficulty in walking, not elsewhere classified  Muscle weakness (generalized)  Unsteadiness on feet     Problem List Patient Active Problem List   Diagnosis Date Noted   Iron deficiency anemia 07/16/2019   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 07/16/2019   Serum potassium elevated 07/16/2019   High risk medications (not anticoagulants) long-term use 07/16/2019   Elevated LDL cholesterol level 06/25/2018   Healthcare maintenance 12/13/2017   Encounter for screening for malignant neoplasm of respiratory organs 07/12/2017   Overgrown toenails 07/12/2017   Vitamin D insufficiency 04/28/2017   Closed fracture of one rib of left side 04/28/2017   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 ([place]) 04/28/2017   Diabetes mellitus due to underlying condition without complication, with long-term current use of insulin ([place]) 04/06/2017   Elbow swelling, left 04/06/2017   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[place]) 04/06/2017   Fatigue 04/06/2017   Hypertension associated with diabetes ([place]) 04/06/2017   Screening for AAA (abdominal aortic aneurysm) 04/06/2017   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

## 2021-07-08 ENCOUNTER — Ambulatory Visit: Payer: Medicare HMO

## 2021-07-13 ENCOUNTER — Ambulatory Visit: Payer: Medicare HMO

## 2021-07-13 ENCOUNTER — Other Ambulatory Visit: Payer: Self-pay

## 2021-07-13 DIAGNOSIS — R278 Other lack of coordination: Secondary | ICD-10-CM

## 2021-07-13 DIAGNOSIS — M6281 Muscle weakness (generalized): Secondary | ICD-10-CM

## 2021-07-13 DIAGNOSIS — R2681 Unsteadiness on feet: Secondary | ICD-10-CM

## 2021-07-13 DIAGNOSIS — R269 Unspecified abnormalities of gait and mobility: Secondary | ICD-10-CM

## 2021-07-13 DIAGNOSIS — R262 Difficulty in walking, not elsewhere classified: Secondary | ICD-10-CM

## 2021-07-13 NOTE — Therapy (Signed)
[place] MAIN [facility] SERVICES [address] [place], [place], [address] Phone: [phone]   Fax:  [phone]  Occupational Therapy Treatment  Patient Details  Name: Dustin Ferrell MRN: [id number] Date of Birth: December 16, 1945 Referring Provider (OT): Dr. Manuella Ghazi   Encounter Date: 07/13/2021   OT End of Session - 07/13/21 1537     Visit Number 4    Number of Visits 12    Date for OT Re-Evaluation 08/10/21    OT Start Time 1430    OT Stop Time 1511    OT Time Calculation (min) 41 min    Activity Tolerance Patient tolerated treatment well    Behavior During Therapy [facility] for tasks assessed/performed             Past Medical History:  Diagnosis Date   Anxiety    associated with frontal temporal behavioral dementia   Basal cell carcinoma 06/03/2021   Left nasal ala - needs treatment   Dementia with behavioral disturbance    Diabetes mellitus without complication ([place])    History of traumatic head injury    Hypertension     Past Surgical History:  Procedure Laterality Date   Eye Socket fracture repair Right    HERNIA REPAIR     ROTATOR CUFF REPAIR Left    TONSILLECTOMY     WRIST FRACTURE SURGERY Right     There were no vitals filed for this visit.   Subjective Assessment - 07/13/21 1531     Subjective  "My sister is staying with me for the week while my wife is on her trip."    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, seizure, alcohol use disorder, decreased balance with hx of falling, DM2, diabetic neuropathy, essential tremors    Limitations weakness, balance impairment    Patient Stated Goals "Improve my hand strength and coordination."    Currently in Pain? Yes    Pain Score 1     Pain Location Hip    Pain Orientation Left    Pain Descriptors / Indicators Aching;Sore    Pain Type Chronic pain    Pain Onset More than a month ago    Pain Frequency Intermittent    Aggravating Factors  worse  with transfers/walking    Pain Relieving Factors stretching    Effect of Pain on Daily Activities difficulty walking    Multiple Pain Sites Yes    Pain Score 1    Pain Location Hand    Pain Orientation Right;Left    Pain Descriptors / Indicators Aching;Sore    Pain Type Chronic pain    Pain Onset More than a month ago    Pain Frequency Intermittent    Aggravating Factors  over gripping    Pain Relieving Factors rest, heat, meds    Effect of Pain on Daily Activities discomfort with FMC/gripping activities            Occupational Therapy Treatment: Therapeutic Exercise: Facilitated hand strengthening with use of hand gripper set at 11.2# to remove jumbo pegs from pegboard x2 trials each hand.  Intermittent vc for positioning and slowing pace to minimize dropping.  Addressed dexterity and pinch strengthening working with Jamar pegs, alternating R/L hands to complete 4 rows total, removing pegs with resistive tweezers.  Pt with frequent dropping of pegs and works better to pick up from non-skid surface (towel placed on table top).    Parrafin: Bilat hands x10 min for pain reduction/muscle relaxation  following therapeutic exercise.  Response to Treatment: See Plan/clinical impression below.     OT Education - 07/13/21 1537     Education Details joint protection strategies for bilat hands    Person(s) Educated Patient    Methods Explanation;Verbal cues;Handout;Tactile cues;Demonstration    Comprehension Verbalized understanding;Verbal cues required;Need further instruction              OT Short Term Goals - 06/30/21 0858       OT SHORT TERM GOAL #1   Title Pt will be indep with HEP for increasing strength and coordination in bilat hands    Baseline Eval: not yet initiated    Time 3    Period Weeks    Status New    Target Date 07/19/21               OT Long Term Goals - 06/30/21 0859       OT LONG TERM GOAL #1   Title Pt will increase FOTO score by 2 or  more points to indicate increased functional performance    Baseline Eval: FOTO: 58    Time 6    Period Weeks    Status New    Target Date 08/09/21      OT LONG TERM GOAL #2   Title Pt will increase bilat grip strength by 5 or more lbs to enable improved ability to hold and carry ADL supplies.    Baseline Eval: R grip 64, L 36    Time 6    Period Weeks    Status New    Target Date 08/09/21      OT LONG TERM GOAL #3   Title Pt will increase dexterity bilaterally to enable indep with clothing fasteners.    Baseline Eval: spouse assists with small buttons (pt has new button hook but has not yet used).    Time 6    Period Weeks    Status New    Target Date 08/09/21      OT LONG TERM GOAL #4   Title Pt will verbalize/demo 2 compensatory strategies to manage tremors to ease self feeding tasks.    Baseline Eval: education not yet intiated; pt may benefit from swivel spoon d/t reported difficulty with spoon    Time 6    Period Weeks    Status New    Target Date 08/09/21      OT LONG TERM GOAL #5   Title Pt will verbalize 1-2 strategies to manage dupuytren's contracture in bilat hands.    Baseline Eval: education not yet initiated    Time 6    Period Weeks    Status New    Target Date 08/09/21              Plan - 07/13/21 1547     Clinical Impression Statement OT provided education on joint protection strategies, reinforcing need for frequent rest breaks, alternating tasks, gripping tools with the least amount of force possible.  Pt verbalized understanding.  Good tolerance to all therapeutic exercises today with frequent rest and stretching breaks.  Pt able to independently demo digit extension and abduction stretches for bilat hands.  Pt will continue to benefit from skilled OT for HEP instruction, [place] and strengthening, and ADL training in order to maximize indep with daily tasks.    OT Occupational Profile and History Problem Focused Assessment - Including review of records  relating to presenting problem    Occupational performance deficits (Please refer to  evaluation for details): ADL's;IADL's    Body Structure / Function / Physical Skills ADL;Coordination;UE functional use;Balance;Body mechanics;Decreased knowledge of use of DME;Flexibility;IADL;Pain;Dexterity;FMC;Strength;ROM    Rehab Potential Good    Clinical Decision Making Limited treatment options, no task modification necessary    Comorbidities Affecting Occupational Performance: May have comorbidities impacting occupational performance    Modification or Assistance to Complete Evaluation  No modification of tasks or assist necessary to complete eval    OT Frequency 2x / week    OT Duration 6 weeks    OT Treatment/Interventions Self-care/ADL training;[place];Therapeutic exercise;DME and/or AE instruction;Balance training;Neuromuscular education;Moist Heat;Passive range of motion;Therapeutic activities;Patient/family education    Plan OT to address hand strength and coordination; pt currently seeing PT for LE strength, balance, and mobility    OT Home Exercise Plan not yet initiated    Consulted and Agree with Plan of Care Patient             Patient will benefit from skilled therapeutic intervention in order to improve the following deficits and impairments:   Body Structure / Function / Physical Skills: ADL, Coordination, UE functional use, Balance, Body mechanics, Decreased knowledge of use of DME, Flexibility, IADL, Pain, Dexterity, FMC, Strength, ROM       Visit Diagnosis: Muscle weakness (generalized)  Other lack of coordination    Problem List Patient Active Problem List   Diagnosis Date Noted   Iron deficiency anemia 07/16/2019   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 07/16/2019   Serum potassium elevated 07/16/2019   High risk medications (not anticoagulants) long-term use 07/16/2019   Elevated LDL cholesterol level 06/25/2018   Healthcare maintenance 12/13/2017   Encounter for screening  for malignant neoplasm of respiratory organs 07/12/2017   Overgrown toenails 07/12/2017   Vitamin D insufficiency 04/28/2017   Closed fracture of one rib of left side 04/28/2017   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 ([place]) 04/28/2017   Diabetes mellitus due to underlying condition without complication, with long-term current use of insulin ([facility]) 04/06/2017   Elbow swelling, left 04/06/2017   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[place]) 04/06/2017   Fatigue 04/06/2017   Hypertension associated with diabetes ([place]) 04/06/2017   Screening for AAA (abdominal aortic aneurysm) 04/06/2017   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

## 2021-07-13 NOTE — Therapy (Signed)
[place] MAIN [facility] SERVICES [address] [place], [place], [address] Phone: [phone]   Fax:  [phone]  Physical Therapy Treatment  Patient Details  Name: Dustin Ferrell MRN: [id number] Date of Birth: [birth year] Referring Provider (Dustin Ferrell): Dr. Joselyn Arrow   Encounter Date: 07/13/2021   Dustin Ferrell End of Session - 07/13/21 1514     Visit Number 23    Number of Visits 25    Date for Dustin Ferrell Re-Evaluation 08/04/21    Authorization Type [employer]    Authorization Time Period 05/12/21-08/04/21    Progress Note Due on Visit 20    Dustin Ferrell Start Time 1513    Dustin Ferrell Stop Time 1558    Dustin Ferrell Time Calculation (min) 45 min    Equipment Utilized During Treatment Gait belt    Activity Tolerance Patient tolerated treatment well    Behavior During Therapy [facility] for tasks assessed/performed             Past Medical History:  Diagnosis Date   Anxiety    associated with frontal temporal behavioral dementia   Basal cell carcinoma 06/03/2021   Left nasal ala - needs treatment   Dementia with behavioral disturbance    Diabetes mellitus without complication ([place])    History of traumatic head injury    Hypertension     Past Surgical History:  Procedure Laterality Date   Eye Socket fracture repair Right    HERNIA REPAIR     ROTATOR CUFF REPAIR Left    TONSILLECTOMY     WRIST FRACTURE SURGERY Right     There were no vitals filed for this visit.   Subjective Assessment - 07/13/21 1513     Subjective Patient reports no falls and no new issues today.    Patient is accompained by: Family member    Pertinent History Dustin Ferrell is a 27yoM who is referred to OPPT for imbalance and falls- Dustin Ferrell also followed by Dr. Alvan Dame orthopedics for acute on chronic Left hip pain. Dustin Ferrell is followed by Dr. Manuella Ghazi c neurology. 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    Limitations Standing;Walking;House hold activities    How long can you sit comfortably? no restriction    How long can you stand comfortably? 15 min    How long can you walk comfortably? 10 min    Patient Stated Goals I want to improve my balance and not fall    Currently in Pain? No/denies             Neuromuscular re-ed:    Side step ball toss x 20 feet to left and back to right x 2 trials- Mild difficulty with coordinating movement but no LOB.   Side step ball bounce toss- More difficulty coordinating bouncing ball toss than just simple ball toss- down and back (20 feet each way) x 2.   Soccer kick- 2 person assist- (1 person rolling ball from approx 10 feet away) - kicking with either leg- Mild unsteadiness requiring CGA  Soccer step and kick- (perfson rolling ball back approx 15 feet and patient instructed to step forward with 1 foot (opp of kicking foot) x 10 kicks x 2 trials.   Ladder drills  (Forward- 1 foot into 1 square)- no difficulty x length of ladder x 4 (Backward- Increased difficulty)-  Patient unable to walk retro (1 foot per square with much decreased step length and increased unsteadiness.)   Side step- Increased difficulty with balance - difficulty coordinating side steps evenly into the square- did improve with practice.   Forward/backward over 1/2 foam x 15 reps - Mild increase difficulty with retro step   Therapeutic Exercises:   Sit to stand x 5 reps with 1 foot on airex pad then 5 more after switching pad to opp LE.  Mini lunge squat x 12 reps each LE Donkey kick  x 12 reps each leg Education provided throughout session via VC/TC and demonstration to facilitate movement at target joints and correct muscle activation for all testing and exercises performed.                        Dustin Ferrell Education - 07/13/21 1513     Education Details  Exercise technique    Person(s) Educated Patient    Methods Explanation;Demonstration;Tactile cues;Verbal cues    Comprehension Verbalized understanding;Returned demonstration;Verbal cues required;Tactile cues required;Need further instruction              Dustin Ferrell Short Term Goals - 05/12/21 1040       Dustin Ferrell SHORT TERM GOAL #1   Title Dustin Ferrell will be independent with HEP in order to improve strength and balance in order to decrease fall risk and improve function at home and work.    Baseline 11/15: not doing HEP right now; 12/5:  doing HEP with help of spouse every day, but without spouse has difficulty with compliance. 05/13/2021- Due to some mild cognitive issues- this goal is not attainable as he requires some supervision with home program.    Time 6    Period Weeks    Status Unable to assess    Target Date 04/01/21               Dustin Ferrell Long Term Goals - 06/29/21 1607       Dustin Ferrell LONG TERM GOAL #1   Title Dustin Ferrell will improve FOTO to target score of 60 display perceived improvements in ability to complete ADL's.    Baseline 02/18/2021= 55%, 12/5: 46%; 06/29/2021= 57%    Time 12    Period Weeks    Status On-going    Target Date 08/04/21      Dustin Ferrell LONG TERM GOAL #2   Title Dustin Ferrell will decrease 5TSTS by at least 3 seconds in order to demonstrate clinically significant improvement in LE strength.    Baseline 02/18/2021= 15 sec, 11/15: 17.3 sec, 12/5: 11.19s with BUE support, 14.55s no UE support; 05/12/2021= 11:35 sec    Time 12    Period Weeks    Status Achieved    Target Date 05/13/21      Dustin Ferrell LONG TERM GOAL #3   Title Patient will increase Functional Gait Assessment score to >22/30 as to reduce fall risk and improve dynamic gait safety with community ambulation.    Baseline 02/18/2021= 17/30, 11/15: 14/30, 12/5: 13/30; 12/14= 18/30; 06/29/2021= 19/30    Time 12    Period Weeks    Status On-going    Target Date 08/04/21      Dustin Ferrell LONG TERM GOAL #4   Title Dustin Ferrell will increase 10MWT by at least 0.13  m/s in order to demonstrate clinically significant improvement in community ambulation.    Baseline 02/18/2021= 0.89 m/s without AD, 11/15: 0.97 m/s, 12/5: 1.63m/s, no AD; 06/29/2021= 1.15 m/s without an AD  Time 12    Period Weeks    Status Achieved    Target Date 08/04/21      Dustin Ferrell LONG TERM GOAL #5   Title Patient will report decreased left side buttock pain to less than 4/10 with transfers/mobility for improved functional mobility in home and community.    Baseline 05/12/2021= 6/10 left gluteal pain.; 06/29/2021- Patient states his gluteal pain has much improved and only rates as "sore" at times but not really painful.    Time 12    Period Weeks    Status Achieved                   Plan - 07/13/21 1514     Clinical Impression Statement Patient arrived to session with excellent motivation. He responded well overall to all verbal cues- presenting with increased difficulty with coordination activities particularly retro gait and more dynamic balance activities. He did improve overall with practice adapting to tasks well with minimal VC. will benefit from further skilled Dustin Ferrell to address these deficits in order to improve safety and independence with ambulation and functional mobility    Personal Factors and Comorbidities Age;Comorbidity 3+    Comorbidities remote TBI, frontal dementia, DM    Examination-Activity Limitations Carry;Other;Locomotion Level    Examination-Participation Restrictions Community Activity;Yard Work    Stability/Clinical Decision Making Stable/Uncomplicated    Rehab Potential Good    Dustin Ferrell Frequency 2x / week    Dustin Ferrell Duration 12 weeks    Dustin Ferrell Treatment/Interventions ADLs/Self Care Home Management;Cryotherapy;Moist Heat;DME Instruction;Gait training;Stair training;Functional mobility training;Therapeutic activities;Therapeutic exercise;Balance training;Neuromuscular re-education;Patient/family education;Manual techniques;Passive range of motion;Dry  needling;Vestibular;Canalith Repostioning;Spinal Manipulations;Ultrasound    Dustin Ferrell Next Visit Plan Continue with progressive LE Strengthening and balance activities for improved mobility static and dynamic balance; continue POC as previously indicated; Continue with manual therapy for pain relief and tightness in low back/gluteal region    Dustin Ferrell Home Exercise Plan Access Code: [email or web address]; 04/15/2021=Access Code: [email or web address]; no udpates    Consulted and Agree with Plan of Care Patient;Family member/caregiver    Family Member Consulted wife             Patient will benefit from skilled therapeutic intervention in order to improve the following deficits and impairments:  Abnormal gait, Decreased activity tolerance, Decreased balance, Decreased coordination, Decreased cognition, Decreased mobility, Decreased safety awareness, Decreased strength, Impaired perceived functional ability  Visit Diagnosis: Abnormality of gait and mobility  Difficulty in walking, not elsewhere classified  Muscle weakness (generalized)  Unsteadiness on feet     Problem List Patient Active Problem List   Diagnosis Date Noted   Iron deficiency anemia 07/16/2019   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 07/16/2019   Serum potassium elevated 07/16/2019   High risk medications (not anticoagulants) long-term use 07/16/2019   Elevated LDL cholesterol level 06/25/2018   Healthcare maintenance 12/13/2017   Encounter for screening for malignant neoplasm of respiratory organs 07/12/2017   Overgrown toenails 07/12/2017   Vitamin D insufficiency 04/28/2017   Closed fracture of one rib of left side 04/28/2017   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 ([place]) 04/28/2017   Diabetes mellitus due to underlying condition without complication, with long-term current use of insulin ([place]) 04/06/2017   Elbow swelling, left 04/06/2017   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[place]) 04/06/2017   Fatigue 04/06/2017   Hypertension associated with  diabetes ([place]) 04/06/2017   Screening for AAA (abdominal aortic aneurysm) 04/06/2017   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

## 2021-07-15 ENCOUNTER — Other Ambulatory Visit: Payer: Self-pay

## 2021-07-15 ENCOUNTER — Ambulatory Visit: Payer: Medicare HMO

## 2021-07-15 DIAGNOSIS — R269 Unspecified abnormalities of gait and mobility: Secondary | ICD-10-CM | POA: Diagnosis not present

## 2021-07-15 DIAGNOSIS — R262 Difficulty in walking, not elsewhere classified: Secondary | ICD-10-CM

## 2021-07-15 DIAGNOSIS — M6281 Muscle weakness (generalized): Secondary | ICD-10-CM

## 2021-07-15 DIAGNOSIS — R2681 Unsteadiness on feet: Secondary | ICD-10-CM

## 2021-07-15 NOTE — Therapy (Signed)
[place] MAIN [facility] SERVICES [address] [place], [place], [address] Phone: [phone]   Fax:  [phone]  Physical Therapy Treatment  Patient Details  Name: Dustin Ferrell MRN: [id number] Date of Birth: 04-10-1946 Referring Provider (PT): Dr. Joselyn Arrow   Encounter Date: 07/15/2021   PT End of Session - 07/15/21 1617     Visit Number 24    Number of Visits 25    Date for PT Re-Evaluation 08/04/21    Authorization Type [employer] Medicare    Authorization Time Period 05/12/21-08/04/21    Progress Note Due on Visit 30    PT Start Time 1615    PT Stop Time 1659    PT Time Calculation (min) 44 min    Equipment Utilized During Treatment Gait belt    Activity Tolerance Patient tolerated treatment well    Behavior During Therapy [facility] for tasks assessed/performed             Past Medical History:  Diagnosis Date   Anxiety    associated with frontal temporal behavioral dementia   Basal cell carcinoma 06/03/2021   Left nasal ala - needs treatment   Dementia with behavioral disturbance    Diabetes mellitus without complication ([place])    History of traumatic head injury    Hypertension     Past Surgical History:  Procedure Laterality Date   Eye Socket fracture repair Right    HERNIA REPAIR     ROTATOR CUFF REPAIR Left    TONSILLECTOMY     WRIST FRACTURE SURGERY Right     There were no vitals filed for this visit.   Subjective Assessment - 07/15/21 1616     Subjective Patient reports having a good week- been cooking some and denies any falls and no pain.    Patient is accompained by: Family member    Pertinent History Dustin Ferrell is a 42yoM who is referred to OPPT for imbalance and falls- pt also followed by Dr. Alvan Dame orthopedics for acute on chronic Left hip pain. Pt is followed by Dr. Manuella Ghazi c neurology. 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    Limitations Standing;Walking;House hold activities    How long can you sit comfortably? no restriction    How long can you stand comfortably? 15 min    How long can you walk comfortably? 10 min    Patient Stated Goals I want to improve my balance and not fall               INTERVENTIONS:     Therapeutic Exercises:    Sit to stand x 10 reps without UE support- Mild shakiness. Patient reports as medium today Mini lunge squat x 12 reps each LE Donkey kick  x 12 reps each leg- VC for correct technique Seated hamstring curls- with GTB x 12 reps Seated knee ext with 4lb AW x 12 reps Education provided throughout session via VC/TC and demonstration to facilitate movement at target joints and correct muscle activation for all testing and exercises performed.    Neuromuscular re-education:   Dynamic marching (standing on blue airex pad) - attempted without UE support but required  1 UE support due to increased unsteadiness.   Static stand on blue airex pad with horizontal head turns (Eyes open)  Static  stand on blue airex pad with vertical head turns (Eyes open)    Static stand on rocker board A/P Dynamic rocker board  moving ant then post   Dynamic Forward/backward over 1/2 foam roll with BUE Support at support bar x 12 reps each. Pt reports intervention is a medium challenging.     Side step over orange hurdle x 20 reps. Pt without support to 3 finger support. Pt improves foot clearance.   Education provided throughout session via VC/TC and demonstration to facilitate movement at target joints and correct muscle activation for all testing and exercises performed.                PT Education - 07/15/21 1616     Education Details Exercise technique    Person(s) Educated Patient    Methods Explanation;Demonstration;Tactile  cues;Verbal cues    Comprehension Verbalized understanding;Returned demonstration;Verbal cues required;Tactile cues required;Need further instruction              PT Short Term Goals - 05/12/21 1040       PT SHORT TERM GOAL #1   Title Pt will be independent with HEP in order to improve strength and balance in order to decrease fall risk and improve function at home and work.    Baseline 11/15: not doing HEP right now; 12/5:  doing HEP with help of spouse every day, but without spouse has difficulty with compliance. 05/13/2021- Due to some mild cognitive issues- this goal is not attainable as he requires some supervision with home program.    Time 6    Period Weeks    Status Unable to assess    Target Date 04/01/21               PT Long Term Goals - 06/29/21 1607       PT LONG TERM GOAL #1   Title Pt will improve FOTO to target score of 60 display perceived improvements in ability to complete ADL's.    Baseline 02/18/2021= 55%, 12/5: 46%; 06/29/2021= 57%    Time 12    Period Weeks    Status On-going    Target Date 08/04/21      PT LONG TERM GOAL #2   Title Pt will decrease 5TSTS by at least 3 seconds in order to demonstrate clinically significant improvement in LE strength.    Baseline 02/18/2021= 15 sec, 11/15: 17.3 sec, 12/5: 11.19s with BUE support, 14.55s no UE support; 05/12/2021= 11:35 sec    Time 12    Period Weeks    Status Achieved    Target Date 05/13/21      PT LONG TERM GOAL #3   Title Patient will increase Functional Gait Assessment score to >22/30 as to reduce fall risk and improve dynamic gait safety with community ambulation.    Baseline 02/18/2021= 17/30, 11/15: 14/30, 12/5: 13/30; 12/14= 18/30; 06/29/2021= 19/30    Time 12    Period Weeks    Status On-going    Target Date 08/04/21      PT LONG TERM GOAL #4   Title Pt will increase 10MWT by at least 0.13 m/s in order to demonstrate clinically significant improvement in community ambulation.     Baseline 02/18/2021= 0.89 m/s without AD, 11/15: 0.97 m/s, 12/5: 1.1m/s, no AD; 06/29/2021= 1.15 m/s without an AD    Time 12    Period Weeks    Status Achieved    Target Date 08/04/21      PT LONG TERM GOAL #  5   Title Patient will report decreased left side buttock pain to less than 4/10 with transfers/mobility for improved functional mobility in home and community.    Baseline 05/12/2021= 6/10 left gluteal pain.; 06/29/2021- Patient states his gluteal pain has much improved and only rates as "sore" at times but not really painful.    Time 12    Period Weeks    Status Achieved                   Plan - 07/15/21 1617     Clinical Impression Statement Patient participated well today with combination of LE strengthening and balance activities. He demonstrated initial increased tremors but settled down during visit. He was able to perform well with all balance and strength exercises citing only fatigue as limiting factor. He improved with dynamic head turns with increased practice and no difficulty stepping over hurdle today. Patient will benefit from further skilled PT to address these deficits in order to improve safety and independence with ambulation and functional mobility    Personal Factors and Comorbidities Age;Comorbidity 3+    Comorbidities remote TBI, frontal dementia, DM    Examination-Activity Limitations Carry;Other;Locomotion Level    Examination-Participation Restrictions Community Activity;Yard Work    Stability/Clinical Decision Making Stable/Uncomplicated    Rehab Potential Good    PT Frequency 2x / week    PT Duration 12 weeks    PT Treatment/Interventions ADLs/Self Care Home Management;Cryotherapy;Moist Heat;DME Instruction;Gait training;Stair training;Functional mobility training;Therapeutic activities;Therapeutic exercise;Balance training;Neuromuscular re-education;Patient/family education;Manual techniques;Passive range of motion;Dry needling;Vestibular;Canalith  Repostioning;Spinal Manipulations;Ultrasound    PT Next Visit Plan Continue with progressive LE Strengthening and balance activities for improved mobility static and dynamic balance; continue POC as previously indicated; Continue with manual therapy for pain relief and tightness in low back/gluteal region    PT Home Exercise Plan Access Code: [email or web address]; 04/15/2021=Access Code: [email or web address]; no udpates    Consulted and Agree with Plan of Care Patient;Family member/caregiver    Family Member Consulted wife             Patient will benefit from skilled therapeutic intervention in order to improve the following deficits and impairments:  Abnormal gait, Decreased activity tolerance, Decreased balance, Decreased coordination, Decreased cognition, Decreased mobility, Decreased safety awareness, Decreased strength, Impaired perceived functional ability  Visit Diagnosis: Abnormality of gait and mobility  Difficulty in walking, not elsewhere classified  Muscle weakness (generalized)  Unsteadiness on feet     Problem List Patient Active Problem List   Diagnosis Date Noted   Iron deficiency anemia 07/16/2019   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 07/16/2019   Serum potassium elevated 07/16/2019   High risk medications (not anticoagulants) long-term use 07/16/2019   Elevated LDL cholesterol level 06/25/2018   Healthcare maintenance 12/13/2017   Encounter for screening for malignant neoplasm of respiratory organs 07/12/2017   Overgrown toenails 07/12/2017   Vitamin D insufficiency 04/28/2017   Closed fracture of one rib of left side 04/28/2017   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 ([place]) 04/28/2017   Diabetes mellitus due to underlying condition without complication, with long-term current use of insulin ([place]) 04/06/2017   Elbow swelling, left 04/06/2017   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   Fatigue 04/06/2017   Hypertension associated with diabetes ([place]) 04/06/2017    Screening for AAA (abdominal aortic aneurysm) 04/06/2017   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

## 2021-07-20 ENCOUNTER — Ambulatory Visit: Payer: Medicare HMO

## 2021-07-20 ENCOUNTER — Other Ambulatory Visit: Payer: Self-pay

## 2021-07-20 DIAGNOSIS — R269 Unspecified abnormalities of gait and mobility: Secondary | ICD-10-CM | POA: Diagnosis not present

## 2021-07-20 DIAGNOSIS — R2681 Unsteadiness on feet: Secondary | ICD-10-CM

## 2021-07-20 DIAGNOSIS — M6281 Muscle weakness (generalized): Secondary | ICD-10-CM

## 2021-07-20 DIAGNOSIS — R278 Other lack of coordination: Secondary | ICD-10-CM

## 2021-07-20 DIAGNOSIS — R262 Difficulty in walking, not elsewhere classified: Secondary | ICD-10-CM

## 2021-07-20 NOTE — Therapy (Signed)
[place] MAIN [facility] SERVICES [address] [place], [place], [address] Phone: [phone]   Fax:  [phone]  Physical Therapy Treatment  Patient Details  Name: Dustin Ferrell MRN: [id number] Date of Birth: 05-29-1946 Referring Provider (PT): Dr. Joselyn Arrow   Encounter Date: 07/20/2021   PT End of Session - 07/20/21 1621     Visit Number 25    Number of Visits 37    Date for PT Re-Evaluation 08/04/21    Authorization Type [employer]    Authorization Time Period 05/12/21-08/04/21    Progress Note Due on Visit 30    PT Start Time 1600    PT Stop Time 1644    PT Time Calculation (min) 44 min    Equipment Utilized During Treatment Gait belt    Activity Tolerance Patient tolerated treatment well    Behavior During Therapy [facility] for tasks assessed/performed             Past Medical History:  Diagnosis Date   Anxiety    associated with frontal temporal behavioral dementia   Basal cell carcinoma 06/03/2021   Left nasal ala - needs treatment   Dementia with behavioral disturbance    Diabetes mellitus without complication ([place])    History of traumatic head injury    Hypertension     Past Surgical History:  Procedure Laterality Date   Eye Socket fracture repair Right    HERNIA REPAIR     ROTATOR CUFF REPAIR Left    TONSILLECTOMY     WRIST FRACTURE SURGERY Right     There were no vitals filed for this visit.   Subjective Assessment - 07/20/21 1603     Subjective Patient denies any falls over the weekend- States doing well today. Agreeable to walk outdoors and test his balance.    Patient is accompained by: Family member    Pertinent History Dustin Ferrell is a 89yoM who is referred to OPPT for imbalance and falls- pt also followed by Dr. Alvan Dame orthopedics for acute on chronic Left hip pain. Pt is followed by Dr. Manuella Ghazi c neurology. 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    Limitations Standing;Walking;House hold activities    How long can you sit comfortably? no restriction    How long can you stand comfortably? 15 min    How long can you walk comfortably? 10 min    Patient Stated Goals I want to improve my balance and not fall    Currently in Pain? No/denies            INTERVENTIONS:   Neuromuscular re-education:   Dynamic lateral Side steps over 1/2 foam x 20 rep each direction- Initially 1 UE support- able to progress to No UE support.  Forward/backward step over 1/2 foam- x 20 reps- Mild difficulty with stepping backward.    Staggered standing with 1/2 foam between feet - hold 30 sec  Stand on airex pad (narrowed feet) with trunk rotation  Stand on airex pad (staggered feet ) with trunk twist- ball  Star- 1 foot in middle of star while the other foot step tap and end of each point x 3 min  Donkey kicks - alt. LE x 15 reps  Sit to stand with 1 Leg positioned out in front of the other  x 10 reps each LE   Therapeutic activity:  Ambulation outdoors with Supervision- on all surfaces- grass, pine needles, concrete sidewalk, ramp, steps, curb- No loss of balance however patient did require a seated rest break about 10 min in and another one upon completion.   Education provided throughout session via VC/TC and demonstration to facilitate movement at target joints and correct muscle activation for all testing and exercises performed.                     PT Education - 07/20/21 1619     Education Details safety cues with outdoor gait today.    Person(s) Educated Patient    Methods Explanation;Demonstration;Tactile cues;Verbal cues    Comprehension Verbalized understanding;Returned demonstration;Verbal cues required;Tactile cues required;Need further instruction               PT Short Term Goals - 05/12/21 1040       PT SHORT TERM GOAL #1   Title Pt will be independent with HEP in order to improve strength and balance in order to decrease fall risk and improve function at home and work.    Baseline 11/15: not doing HEP right now; 12/5:  doing HEP with help of spouse every day, but without spouse has difficulty with compliance. 05/13/2021- Due to some mild cognitive issues- this goal is not attainable as he requires some supervision with home program.    Time 6    Period Weeks    Status Unable to assess    Target Date 04/01/21               PT Long Term Goals - 06/29/21 1607       PT LONG TERM GOAL #1   Title Pt will improve FOTO to target score of 60 display perceived improvements in ability to complete ADL's.    Baseline 02/18/2021= 55%, 12/5: 46%; 06/29/2021= 57%    Time 12    Period Weeks    Status On-going    Target Date 08/04/21      PT LONG TERM GOAL #2   Title Pt will decrease 5TSTS by at least 3 seconds in order to demonstrate clinically significant improvement in LE strength.    Baseline 02/18/2021= 15 sec, 11/15: 17.3 sec, 12/5: 11.19s with BUE support, 14.55s no UE support; 05/12/2021= 11:35 sec    Time 12    Period Weeks    Status Achieved    Target Date 05/13/21      PT LONG TERM GOAL #3   Title Patient will increase Functional Gait Assessment score to >22/30 as to reduce fall risk and improve dynamic gait safety with community ambulation.    Baseline 02/18/2021= 17/30, 11/15: 14/30, 12/5: 13/30; 12/14= 18/30; 06/29/2021= 19/30    Time 12    Period Weeks    Status On-going    Target Date 08/04/21      PT LONG TERM GOAL #4   Title Pt will increase 10MWT by at least 0.13 m/s in order to demonstrate clinically significant improvement in community ambulation.    Baseline 02/18/2021= 0.89 m/s without AD, 11/15: 0.97 m/s, 12/5: 1.41m/s, no AD; 06/29/2021= 1.15 m/s without an AD    Time 12    Period Weeks    Status  Achieved    Target Date 08/04/21      PT LONG TERM GOAL #5   Title Patient will report decreased left side buttock pain to less than 4/10 with transfers/mobility for improved functional  mobility in home and community.    Baseline 05/12/2021= 6/10 left gluteal pain.; 06/29/2021- Patient states his gluteal pain has much improved and only rates as "sore" at times but not really painful.    Time 12    Period Weeks    Status Achieved                   Plan - 07/20/21 1622     Clinical Impression Statement Patient presented with good motivation today and able to progress overall with mobility- Able to walk outdoors on unlevel terrain without LOB. He did endorse fatigue as limiting factor and requires VC and occasional UE support for balance today. Patient will benefit from further skilled PT to address these deficits in order to improve safety and independence with ambulation and functional mobility    Personal Factors and Comorbidities Age;Comorbidity 3+    Comorbidities remote TBI, frontal dementia, DM    Examination-Activity Limitations Carry;Other;Locomotion Level    Examination-Participation Restrictions Community Activity;Yard Work    Stability/Clinical Decision Making Stable/Uncomplicated    Rehab Potential Good    PT Frequency 2x / week    PT Duration 12 weeks    PT Treatment/Interventions ADLs/Self Care Home Management;Cryotherapy;Moist Heat;DME Instruction;Gait training;Stair training;Functional mobility training;Therapeutic activities;Therapeutic exercise;Balance training;Neuromuscular re-education;Patient/family education;Manual techniques;Passive range of motion;Dry needling;Vestibular;Canalith Repostioning;Spinal Manipulations;Ultrasound    PT Next Visit Plan Continue with progressive LE Strengthening and balance activities for improved mobility static and dynamic balance; continue POC as previously indicated; Continue with manual therapy for pain relief and tightness in low  back/gluteal region    PT Home Exercise Plan Access Code: [email or web address]; 04/15/2021=Access Code: [email or web address]; no udpates    Consulted and Agree with Plan of Care Patient;Family member/caregiver    Family Member Consulted wife             Patient will benefit from skilled therapeutic intervention in order to improve the following deficits and impairments:  Abnormal gait, Decreased activity tolerance, Decreased balance, Decreased coordination, Decreased cognition, Decreased mobility, Decreased safety awareness, Decreased strength, Impaired perceived functional ability  Visit Diagnosis: Abnormality of gait and mobility  Difficulty in walking, not elsewhere classified  Muscle weakness (generalized)  Unsteadiness on feet     Problem List Patient Active Problem List   Diagnosis Date Noted   Iron deficiency anemia 07/16/2019   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 07/16/2019   Serum potassium elevated 07/16/2019   High risk medications (not anticoagulants) long-term use 07/16/2019   Elevated LDL cholesterol level 06/25/2018   Healthcare maintenance 12/13/2017   Encounter for screening for malignant neoplasm of respiratory organs 07/12/2017   Overgrown toenails 07/12/2017   Vitamin D insufficiency 04/28/2017   Closed fracture of one rib of left side 04/28/2017   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 ([place]) 04/28/2017   Diabetes mellitus due to underlying condition without complication, with long-term current use of insulin ([place]) 04/06/2017   Elbow swelling, left 04/06/2017   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[place]) 04/06/2017   Fatigue 04/06/2017   Hypertension associated with diabetes ([place]) 04/06/2017   Screening for AAA (abdominal aortic aneurysm) 04/06/2017   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

## 2021-07-21 NOTE — Therapy (Signed)
[place] MAIN [facility] SERVICES [place], [place], [address] Phone: [phone]   Fax:  [phone]  Occupational Therapy Treatment  Patient Details  Name: Dustin Ferrell MRN: [id number] Date of Birth: 01-Sep-1945 Referring Provider (OT): Dr. Manuella Ghazi   Encounter Date: 07/20/2021   OT End of Session - 07/21/21 0816     Visit Number 5    Number of Visits 12    Date for OT Re-Evaluation 08/10/21    OT Start Time 1518    OT Stop Time 1600    OT Time Calculation (min) 42 min    Activity Tolerance Patient tolerated treatment well    Behavior During Therapy [facility] for tasks assessed/performed             Past Medical History:  Diagnosis Date   Anxiety    associated with frontal temporal behavioral dementia   Basal cell carcinoma 06/03/2021   Left nasal ala - needs treatment   Dementia with behavioral disturbance    Diabetes mellitus without complication ([place])    History of traumatic head injury    Hypertension     Past Surgical History:  Procedure Laterality Date   Eye Socket fracture repair Right    HERNIA REPAIR     ROTATOR CUFF REPAIR Left    TONSILLECTOMY     WRIST FRACTURE SURGERY Right     There were no vitals filed for this visit.   Subjective Assessment - 07/20/21 0815     Subjective  "I can tell my hands are getting stronger.  It's helping."    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, seizure, alcohol use disorder, decreased balance with hx of falling, DM2, diabetic neuropathy, essential tremors    Limitations weakness, balance impairment    Patient Stated Goals "Improve my hand strength and coordination."    Currently in Pain? Yes    Pain Score 2     Pain Location Hand    Pain Orientation Left    Pain Descriptors / Indicators Aching    Pain Type Chronic pain    Pain Onset More than a month ago    Pain Frequency Intermittent    Aggravating Factors  general discomfort from  arthritis    Pain Relieving Factors rest, heat, stretching    Multiple Pain Sites No    Pain Onset More than a month ago            Occupational Therapy Treatment: [place]: Bilat hands x10 min for pain reduction/muscle relaxation in prep for therapeutic exercise.  Therapeutic Exercise: Facilitated hand strengthening with use of hand gripper set at 11.2# to remove jumbo pegs from pegboard x1 trial each hand, and increased to 17.9# for 2nd trial each hand.  Intermittent vc for positioning and slowing pace to minimize dropping.  Addressed dexterity and pinch strengthening working with Emergency planning/management officer.  Challenged pt with picking up pieces from dish rather than non-skid towel surface, requiring increased time.  Pt used resistant tweezers to place and remove spacers d/t frequent dropping from thumb and index finger despite cues for positioning techniques for spacer within fingertips.   Response to Treatment: See Plan/clinical impression below.    OT Education - 07/21/21 0816     Education Details HEP    Person(s) Educated Patient    Methods Explanation;Verbal cues;Tactile cues;Demonstration    Comprehension Verbalized understanding;Verbal cues required;Need further instruction;Returned demonstration  OT Short Term Goals - 06/30/21 0858       OT SHORT TERM GOAL #1   Title Pt will be indep with HEP for increasing strength and coordination in bilat hands    Baseline Eval: not yet initiated    Time 3    Period Weeks    Status New    Target Date 07/19/21               OT Long Term Goals - 06/30/21 0859       OT LONG TERM GOAL #1   Title Pt will increase FOTO score by 2 or more points to indicate increased functional performance    Baseline Eval: FOTO: 58    Time 6    Period Weeks    Status New    Target Date 08/09/21      OT LONG TERM GOAL #2   Title Pt will increase bilat grip strength by 5 or more lbs to enable improved ability to hold  and carry ADL supplies.    Baseline Eval: R grip 64, L 36    Time 6    Period Weeks    Status New    Target Date 08/09/21      OT LONG TERM GOAL #3   Title Pt will increase dexterity bilaterally to enable indep with clothing fasteners.    Baseline Eval: spouse assists with small buttons (pt has new button hook but has not yet used).    Time 6    Period Weeks    Status New    Target Date 08/09/21      OT LONG TERM GOAL #4   Title Pt will verbalize/demo 2 compensatory strategies to manage tremors to ease self feeding tasks.    Baseline Eval: education not yet intiated; pt may benefit from swivel spoon d/t reported difficulty with spoon    Time 6    Period Weeks    Status New    Target Date 08/09/21      OT LONG TERM GOAL #5   Title Pt will verbalize 1-2 strategies to manage dupuytren's contracture in bilat hands.    Baseline Eval: education not yet initiated    Time 6    Period Weeks    Status New    Target Date 08/09/21               Plan - 07/20/21 0827     Clinical Impression Statement Pt reports feeling good improvement in hand strength and improving ability to manage his buttons on his shirt.  Pt was able to increase resistance with hand gripper to remove pegs, increasing from 11.2# to 17.9# for both hands.  Pt continues to develop dexterity in both hands for better manipulation of ADL supplies.  Pt frequently drops small pill size items.  Pt will continue to benefit from skilled OT for HEP instruction, [place] and strengthening, and ADL training in order to maximize indep with daily tasks.    OT Occupational Profile and History Problem Focused Assessment - Including review of records relating to presenting problem    Occupational performance deficits (Please refer to evaluation for details): ADL's;IADL's    Body Structure / Function / Physical Skills ADL;Coordination;UE functional use;Balance;Body mechanics;Decreased knowledge of use of  DME;Flexibility;IADL;Pain;Dexterity;FMC;Strength;ROM    Rehab Potential Good    Clinical Decision Making Limited treatment options, no task modification necessary    Comorbidities Affecting Occupational Performance: May have comorbidities impacting occupational performance    Modification or Assistance  to Complete Evaluation  No modification of tasks or assist necessary to complete eval    OT Frequency 2x / week    OT Duration 6 weeks    OT Treatment/Interventions Self-care/ADL training;[place];Therapeutic exercise;DME and/or AE instruction;Balance training;Neuromuscular education;Moist Heat;Passive range of motion;Therapeutic activities;Patient/family education    Plan OT to address hand strength and coordination; pt currently seeing PT for LE strength, balance, and mobility    OT Home Exercise Plan not yet initiated    Consulted and Agree with Plan of Care Patient             Patient will benefit from skilled therapeutic intervention in order to improve the following deficits and impairments:   Body Structure / Function / Physical Skills: ADL, Coordination, UE functional use, Balance, Body mechanics, Decreased knowledge of use of DME, Flexibility, IADL, Pain, Dexterity, FMC, Strength, ROM       Visit Diagnosis: Muscle weakness (generalized)  Other lack of coordination    Problem List Patient Active Problem List   Diagnosis Date Noted   Iron deficiency anemia 07/16/2019   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 07/16/2019   Serum potassium elevated 07/16/2019   High risk medications (not anticoagulants) long-term use 07/16/2019   Elevated LDL cholesterol level 06/25/2018   Healthcare maintenance 12/13/2017   Encounter for screening for malignant neoplasm of respiratory organs 07/12/2017   Overgrown toenails 07/12/2017   Vitamin D insufficiency 04/28/2017   Closed fracture of one rib of left side 04/28/2017   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 ([place])  04/28/2017   Diabetes mellitus due to underlying condition without complication, with long-term current use of insulin ([facility]) 04/06/2017   Elbow swelling, left 04/06/2017   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[place]) 04/06/2017   Fatigue 04/06/2017   Hypertension associated with diabetes ([place]) 04/06/2017   Screening for AAA (abdominal aortic aneurysm) 04/06/2017   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 [place], [place], [address] Phone: [phone]   Fax:  [phone]  Name: Dustin Ferrell MRN: [id number] Date of Birth: Jun 22, 1945

## 2021-07-22 ENCOUNTER — Other Ambulatory Visit: Payer: Self-pay

## 2021-07-22 ENCOUNTER — Ambulatory Visit: Payer: Medicare HMO | Admitting: Occupational Therapy

## 2021-07-22 ENCOUNTER — Encounter: Payer: Self-pay | Admitting: Occupational Therapy

## 2021-07-22 ENCOUNTER — Ambulatory Visit: Payer: Medicare HMO

## 2021-07-22 DIAGNOSIS — M6281 Muscle weakness (generalized): Secondary | ICD-10-CM

## 2021-07-22 DIAGNOSIS — R278 Other lack of coordination: Secondary | ICD-10-CM

## 2021-07-22 DIAGNOSIS — R269 Unspecified abnormalities of gait and mobility: Secondary | ICD-10-CM

## 2021-07-22 DIAGNOSIS — R262 Difficulty in walking, not elsewhere classified: Secondary | ICD-10-CM

## 2021-07-22 DIAGNOSIS — R2681 Unsteadiness on feet: Secondary | ICD-10-CM

## 2021-07-22 NOTE — Therapy (Signed)
[place] MAIN [facility] SERVICES [address] [place], [place], [address] Phone: [phone]   Fax:  [phone]  Occupational Therapy Treatment  Patient Details  Name: Dustin Ferrell MRN: [id number] Date of Birth: 04/15/1946 Referring Provider (OT): Dr. Manuella Ghazi   Encounter Date: 07/22/2021   OT End of Session - 07/22/21 1533     Visit Number 6    Number of Visits 12    Date for OT Re-Evaluation 08/10/21    OT Start Time 1523    OT Stop Time 1610    OT Time Calculation (min) 47 min    Activity Tolerance Patient tolerated treatment well    Behavior During Therapy [facility] for tasks assessed/performed             Past Medical History:  Diagnosis Date   Anxiety    associated with frontal temporal behavioral dementia   Basal cell carcinoma 06/03/2021   Left nasal ala - needs treatment   Dementia with behavioral disturbance    Diabetes mellitus without complication ([place])    History of traumatic head injury    Hypertension     Past Surgical History:  Procedure Laterality Date   Eye Socket fracture repair Right    HERNIA REPAIR     ROTATOR CUFF REPAIR Left    TONSILLECTOMY     WRIST FRACTURE SURGERY Right     There were no vitals filed for this visit.   Subjective Assessment - 07/22/21 1532     Subjective  Pt. reports that he is improving.    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 No/denies            OT TREATMENT    Neuro muscular re-education:  Pt. Worked on bilateral [facility] skills grasping coins from a tabletop surface, placing them into a resistive container, and pushing them through the slot while isolating his 2nd digit. Pt. worked on moving the coins to the edge of a flat surface isolating her 2nd digit to the thumb to form the grasp in preparation for placing  them into a resistive container. Pt. worked on grasping, and storing the coins in the palm of her hand, and translatory movements moving them the pegs from his palm to the tip of his 2nd digit, and thumb in preparation for stacking them at the tabletop.  Therapeutic Exercise:  Pt. performed gross gripping with a gross grip strengthener. Pt. worked on sustaining grip while grasping pegs and reaching at various heights. The Gripper was set to 17.9# of grip strength resistance. Pt. performed resistive EZ Board exercises for bilateral forearm supination/pronation, wrist flexion/extension using gross grasp, and lateral pinch (key) grasp. Pt. performed resistive EZ Board exercises angled in several planes to promote shoulder flexion, abduction, and wrist flexion, and extension while performing resistive wrist flexion and extension with a gross grip.   Paraffin:  Pt. Tolerated paraffin to bilateral hands for 10 min. For pain, and stiffness in preparation for there. Ex, and coordination.   Pt. reports having had a fall when trying to prune his bushes yesterday. Pt. is making progress overall, and was able to tolerate the grip strengthener set to 17.9# for both hands. Pt. required verbal cues, and cues for visual demonstration with each task, and increased cues for the [facility] with the coins. Pt. Presents with impulsivity, and at times  required cues for techniques, pace, and task length. Pt. Continues to work on improving UE strength, and [facility] skills in preparation for ADL, and IADL functioning.                         OT Education - 07/22/21 1533     Education Details bilateral hand function    Person(s) Educated Patient    Methods Explanation;Verbal cues;Tactile cues;Demonstration    Comprehension Verbalized understanding;Verbal cues required;Need further instruction;Returned demonstration              OT Short Term Goals - 06/30/21 0858       OT SHORT TERM GOAL #1   Title Pt  will be indep with HEP for increasing strength and coordination in bilat hands    Baseline Eval: not yet initiated    Time 3    Period Weeks    Status New    Target Date 07/19/21               OT Long Term Goals - 06/30/21 0859       OT LONG TERM GOAL #1   Title Pt will increase FOTO score by 2 or more points to indicate increased functional performance    Baseline Eval: FOTO: 58    Time 6    Period Weeks    Status New    Target Date 08/09/21      OT LONG TERM GOAL #2   Title Pt will increase bilat grip strength by 5 or more lbs to enable improved ability to hold and carry ADL supplies.    Baseline Eval: R grip 64, L 36    Time 6    Period Weeks    Status New    Target Date 08/09/21      OT LONG TERM GOAL #3   Title Pt will increase dexterity bilaterally to enable indep with clothing fasteners.    Baseline Eval: spouse assists with small buttons (pt has new button hook but has not yet used).    Time 6    Period Weeks    Status New    Target Date 08/09/21      OT LONG TERM GOAL #4   Title Pt will verbalize/demo 2 compensatory strategies to manage tremors to ease self feeding tasks.    Baseline Eval: education not yet intiated; pt may benefit from swivel spoon d/t reported difficulty with spoon    Time 6    Period Weeks    Status New    Target Date 08/09/21      OT LONG TERM GOAL #5   Title Pt will verbalize 1-2 strategies to manage dupuytren's contracture in bilat hands.    Baseline Eval: education not yet initiated    Time 6    Period Weeks    Status New    Target Date 08/09/21                   Plan - 07/22/21 1534      Pt. reports having had a fall when trying to prune his bushes yesterday. Pt. is making progress overall, and was able to tolerate the grip strengthener set to 17.9# for both hands. Pt. required verbal cues, and cues for visual demonstration with each task, and increased cues for the [facility] with the coins. Pt. Presents with  impulsivity, and at times required cues for techniques, pace, and task length. Pt. Continues to work on improving UE  strength, and [facility] skills in preparation for ADL, and IADL functioning.    OT Occupational Profile and History Problem Focused Assessment - Including review of records relating to presenting problem    Occupational performance deficits (Please refer to evaluation for details): ADL's;IADL's    Body Structure / Function / Physical Skills ADL;Coordination;UE functional use;Balance;Body mechanics;Decreased knowledge of use of DME;Flexibility;IADL;Pain;Dexterity;FMC;Strength;ROM    Rehab Potential Good    Clinical Decision Making Limited treatment options, no task modification necessary    Comorbidities Affecting Occupational Performance: May have comorbidities impacting occupational performance    Modification or Assistance to Complete Evaluation  No modification of tasks or assist necessary to complete eval    OT Frequency 2x / week    OT Duration 6 weeks    OT Treatment/Interventions Self-care/ADL training;Paraffin;Therapeutic exercise;DME and/or AE instruction;Balance training;Neuromuscular education;Moist Heat;Passive range of motion;Therapeutic activities;Patient/family education    Plan OT to address hand strength and coordination; pt currently seeing PT for LE strength, balance, and mobility    OT Home Exercise Plan not yet initiated    Consulted and Agree with Plan of Care Patient             Patient will benefit from skilled therapeutic intervention in order to improve the following deficits and impairments:   Body Structure / Function / Physical Skills: ADL, Coordination, UE functional use, Balance, Body mechanics, Decreased knowledge of use of DME, Flexibility, IADL, Pain, Dexterity, FMC, Strength, ROM       Visit Diagnosis: Muscle weakness (generalized)  Other lack of coordination    Problem List Patient Active Problem List   Diagnosis Date Noted   Iron  deficiency anemia 07/16/2019   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 07/16/2019   Serum potassium elevated 07/16/2019   High risk medications (not anticoagulants) long-term use 07/16/2019   Elevated LDL cholesterol level 06/25/2018   Healthcare maintenance 12/13/2017   Encounter for screening for malignant neoplasm of respiratory organs 07/12/2017   Overgrown toenails 07/12/2017   Vitamin D insufficiency 04/28/2017   Closed fracture of one rib of left side 04/28/2017   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 ([place]) 04/28/2017   Diabetes mellitus due to underlying condition without complication, with long-term current use of insulin ([facility]) 04/06/2017   Elbow swelling, left 04/06/2017   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[place]) 04/06/2017   Fatigue 04/06/2017   Hypertension associated with diabetes ([place]) 04/06/2017   Screening for AAA (abdominal aortic aneurysm) 04/06/2017   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 [place], [place], [address] Phone: [phone]   Fax:  [phone]  Name: PEPE MINEAU MRN: [id number] Date of Birth: 26-Mar-1946

## 2021-07-22 NOTE — Therapy (Signed)
[place] MAIN [facility] SERVICES [address] [place], [place], [address] Phone: [phone]   Fax:  [phone]  Physical Therapy Treatment  Patient Details  Name: Dustin Ferrell MRN: [id number] Date of Birth: 12-Apr-1946 Referring Provider (PT): Dr. Joselyn Arrow   Encounter Date: 07/22/2021   PT End of Session - 07/22/21 1533     Visit Number 26    Number of Visits 37    Date for PT Re-Evaluation 08/04/21    Authorization Type [employer]    Authorization Time Period 05/12/21-08/04/21    Progress Note Due on Visit 30    PT Start Time 1615    PT Stop Time 1656    PT Time Calculation (min) 41 min    Equipment Utilized During Treatment Gait belt    Activity Tolerance Patient tolerated treatment well    Behavior During Therapy [facility] for tasks assessed/performed             Past Medical History:  Diagnosis Date   Anxiety    associated with frontal temporal behavioral dementia   Basal cell carcinoma 06/03/2021   Left nasal ala - needs treatment   Dementia with behavioral disturbance    Diabetes mellitus without complication ([place])    History of traumatic head injury    Hypertension     Past Surgical History:  Procedure Laterality Date   Eye Socket fracture repair Right    HERNIA REPAIR     ROTATOR CUFF REPAIR Left    TONSILLECTOMY     WRIST FRACTURE SURGERY Right     There were no vitals filed for this visit.   Subjective Assessment - 07/22/21 1532     Subjective I feel very good overall today- Really no complaints    Patient is accompained by: Family member    Pertinent History Dustin Ferrell is a 28yoM who is referred to OPPT for imbalance and falls- pt also followed by Dr. Alvan Dame orthopedics for acute on chronic Left hip pain. Pt is followed by Dr. Manuella Ghazi c neurology. 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    Limitations Standing;Walking;House hold activities    How long can you sit comfortably? no restriction    How long can you stand comfortably? 15 min    How long can you walk comfortably? 10 min    Patient Stated Goals I want to improve my balance and not fall    Currently in Pain? No/denies            INTERVENTIONS:     Neuromuscular re-education:   Tandem gait on balance airex beam - forward/backward  Tandem static standing on airex balance beam- attempt to hold 5-10 sec each LE   Step ups without UE support- x 12 reps each leg- Patient rated as "Medium" with several loss of balance required very close CGA  Side step down the balance beam while holding onto ball x length of beam x 5.   Dynamic marching (high knees) while standing on airex pad x 20 reps.   Static stand with feet wide on airex pad with lateral hand off of ball (left and right)  x 10- No LOB and patient reports as "easy"  Static stand with feet narrowed  on airex pad with lateral hand off of ball (left and  right)  x 10- 2-3 episodes of  LOB and patient reports as "much tougher"    Therapeutic Exercises:   Sit to stand - Staggered stance with feet - x 10 reps then switch stance and performed 10 more reps without UE Support.    Lunge squat onto bosu ball (curve side up)  x 15 reps with 1UE support (attempted last 2 reps without UE support- very unstable and min assist to recover LOB)   Education provided throughout session via VC/TC and demonstration to facilitate movement at target joints and correct muscle activation for all testing and exercises performed.                PT Education - 07/22/21 1532     Education Details Exercise technique    Person(s) Educated Patient    Methods Explanation    Comprehension Verbalized understanding;Returned demonstration;Verbal cues required;Tactile cues  required;Need further instruction              PT Short Term Goals - 05/12/21 1040       PT SHORT TERM GOAL #1   Title Pt will be independent with HEP in order to improve strength and balance in order to decrease fall risk and improve function at home and work.    Baseline 11/15: not doing HEP right now; 12/5:  doing HEP with help of spouse every day, but without spouse has difficulty with compliance. 05/13/2021- Due to some mild cognitive issues- this goal is not attainable as he requires some supervision with home program.    Time 6    Period Weeks    Status Unable to assess    Target Date 04/01/21               PT Long Term Goals - 06/29/21 1607       PT LONG TERM GOAL #1   Title Pt will improve FOTO to target score of 60 display perceived improvements in ability to complete ADL's.    Baseline 02/18/2021= 55%, 12/5: 46%; 06/29/2021= 57%    Time 12    Period Weeks    Status On-going    Target Date 08/04/21      PT LONG TERM GOAL #2   Title Pt will decrease 5TSTS by at least 3 seconds in order to demonstrate clinically significant improvement in LE strength.    Baseline 02/18/2021= 15 sec, 11/15: 17.3 sec, 12/5: 11.19s with BUE support, 14.55s no UE support; 05/12/2021= 11:35 sec    Time 12    Period Weeks    Status Achieved    Target Date 05/13/21      PT LONG TERM GOAL #3   Title Patient will increase Functional Gait Assessment score to >22/30 as to reduce fall risk and improve dynamic gait safety with community ambulation.    Baseline 02/18/2021= 17/30, 11/15: 14/30, 12/5: 13/30; 12/14= 18/30; 06/29/2021= 19/30    Time 12    Period Weeks    Status On-going    Target Date 08/04/21      PT LONG TERM GOAL #4   Title Pt will increase 10MWT by at least 0.13 m/s in order to demonstrate clinically significant improvement in community ambulation.    Baseline 02/18/2021= 0.89 m/s without AD, 11/15: 0.97 m/s, 12/5: 1.14m/s, no AD; 06/29/2021= 1.15 m/s without an AD    Time  12    Period Weeks    Status Achieved    Target Date 08/04/21      PT LONG TERM GOAL #  5   Title Patient will report decreased left side buttock pain to less than 4/10 with transfers/mobility for improved functional mobility in home and community.    Baseline 05/12/2021= 6/10 left gluteal pain.; 06/29/2021- Patient states his gluteal pain has much improved and only rates as "sore" at times but not really painful.    Time 12    Period Weeks    Status Achieved                   Plan - 07/22/21 1533     Clinical Impression Statement Patient arrived with good motivation- challenged today with progressive balance activities- More difficulty with narrowed stance on standing on unstable surfaces. He did improve some with repetition today but overall - still unsteady requiring CGA to min assist at times. Patient will benefit from further skilled PT to address these deficits in order to improve safety and independence with ambulation and functional mobility    Personal Factors and Comorbidities Age;Comorbidity 3+    Comorbidities remote TBI, frontal dementia, DM    Examination-Activity Limitations Carry;Other;Locomotion Level    Examination-Participation Restrictions Community Activity;Yard Work    Stability/Clinical Decision Making Stable/Uncomplicated    Rehab Potential Good    PT Frequency 2x / week    PT Duration 12 weeks    PT Treatment/Interventions ADLs/Self Care Home Management;Cryotherapy;Moist Heat;DME Instruction;Gait training;Stair training;Functional mobility training;Therapeutic activities;Therapeutic exercise;Balance training;Neuromuscular re-education;Patient/family education;Manual techniques;Passive range of motion;Dry needling;Vestibular;Canalith Repostioning;Spinal Manipulations;Ultrasound    PT Next Visit Plan Continue with progressive LE Strengthening and balance activities for improved mobility static and dynamic balance; continue POC as previously indicated; Continue  with manual therapy for pain relief and tightness in low back/gluteal region    PT Home Exercise Plan Access Code: [email or web address]; 04/15/2021=Access Code: [email or web address]; no udpates    Consulted and Agree with Plan of Care Patient;Family member/caregiver    Family Member Consulted wife             Patient will benefit from skilled therapeutic intervention in order to improve the following deficits and impairments:  Abnormal gait, Decreased activity tolerance, Decreased balance, Decreased coordination, Decreased cognition, Decreased mobility, Decreased safety awareness, Decreased strength, Impaired perceived functional ability  Visit Diagnosis: Abnormality of gait and mobility  Difficulty in walking, not elsewhere classified  Muscle weakness (generalized)  Unsteadiness on feet     Problem List Patient Active Problem List   Diagnosis Date Noted   Iron deficiency anemia 07/16/2019   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 07/16/2019   Serum potassium elevated 07/16/2019   High risk medications (not anticoagulants) long-term use 07/16/2019   Elevated LDL cholesterol level 06/25/2018   Healthcare maintenance 12/13/2017   Encounter for screening for malignant neoplasm of respiratory organs 07/12/2017   Overgrown toenails 07/12/2017   Vitamin D insufficiency 04/28/2017   Closed fracture of one rib of left side 04/28/2017   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 ([place]) 04/28/2017   Diabetes mellitus due to underlying condition without complication, with long-term current use of insulin ([place]) 04/06/2017   Elbow swelling, left 04/06/2017   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[place]) 04/06/2017   Fatigue 04/06/2017   Hypertension associated with diabetes ([place]) 04/06/2017   Screening for AAA (abdominal aortic aneurysm) 04/06/2017   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 [place], [place], [address] Phone: [phone]   Fax:  [phone]  Name: CID AGENA MRN: [id number] Date of Birth: 1946/01/22

## 2021-07-27 ENCOUNTER — Ambulatory Visit: Payer: Medicare HMO

## 2021-07-27 ENCOUNTER — Telehealth: Payer: Self-pay

## 2021-07-27 ENCOUNTER — Other Ambulatory Visit: Payer: Self-pay

## 2021-07-27 DIAGNOSIS — R278 Other lack of coordination: Secondary | ICD-10-CM

## 2021-07-27 DIAGNOSIS — R2681 Unsteadiness on feet: Secondary | ICD-10-CM

## 2021-07-27 DIAGNOSIS — R262 Difficulty in walking, not elsewhere classified: Secondary | ICD-10-CM

## 2021-07-27 DIAGNOSIS — R269 Unspecified abnormalities of gait and mobility: Secondary | ICD-10-CM | POA: Diagnosis not present

## 2021-07-27 DIAGNOSIS — M6281 Muscle weakness (generalized): Secondary | ICD-10-CM

## 2021-07-27 NOTE — Therapy (Signed)
[place] MAIN [facility] SERVICES [address] [place], [place], [address] Phone: [phone]   Fax:  [phone]  Occupational Therapy Treatment  Patient Details  Name: STETSON PELAEZ MRN: [id number] Date of Birth: 1946/05/12 Referring Provider (OT): Dr. Manuella Ghazi   Encounter Date: 07/27/2021   OT End of Session - 07/27/21 1538     Visit Number 7    Number of Visits 12    Date for OT Re-Evaluation 08/10/21    OT Start Time 1515    OT Stop Time 1600    OT Time Calculation (min) 45 min    Activity Tolerance Patient tolerated treatment well    Behavior During Therapy [facility] for tasks assessed/performed             Past Medical History:  Diagnosis Date   Anxiety    associated with frontal temporal behavioral dementia   Basal cell carcinoma 06/03/2021   Left nasal ala - needs treatment   Dementia with behavioral disturbance    Diabetes mellitus without complication ([place])    History of traumatic head injury    Hypertension     Past Surgical History:  Procedure Laterality Date   Eye Socket fracture repair Right    HERNIA REPAIR     ROTATOR CUFF REPAIR Left    TONSILLECTOMY     WRIST FRACTURE SURGERY Right     There were no vitals filed for this visit.   Subjective Assessment - 07/27/21 1536     Subjective  Pt reports buttons are now manageable with his button hook, though spouse still buttons his top button on his shirt.    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 No/denies    Pain Onset More than a month ago    Pain Onset More than a month ago            Occupational Therapy Treatment: Therapeutic Exercise: Facilitated bilat [place] working with Jamar  pegs, placing and removing pegs into board with and without tweezers in each hand.  Practiced picking up pegs from table top without non skid surface, and able to when given vc for prehension patterns, specifically using tip of index finger to thumb tip.  Reinforced more weightbearing to elbows, forearms, and hands when able in order to increase stability as a result of distal tremors.  Pt uses this strategy with intermittent vc for technique.  Practiced picking up coins with from table with same prehension pattern, and placing coins into slotted bank.  Practiced in hand manipulation skills picking up, storing, and discarding small glass stones using either hand.    Response to Treatment: See Plan/clinical impression below.     OT Education - 07/27/21 1538     Education Details self feeding strategies to manage tremors    Person(s) Educated Patient    Methods Explanation;Verbal cues;Tactile cues;Demonstration    Comprehension Verbalized understanding;Verbal cues required;Need further instruction;Returned demonstration              OT Short Term Goals - 06/30/21 0858       OT SHORT TERM GOAL #1   Title Pt will be indep with HEP for increasing strength and coordination in bilat hands    Baseline Eval: not yet  initiated    Time 3    Period Weeks    Status New    Target Date 07/19/21               OT Long Term Goals - 06/30/21 0859       OT LONG TERM GOAL #1   Title Pt will increase FOTO score by 2 or more points to indicate increased functional performance    Baseline Eval: FOTO: 58    Time 6    Period Weeks    Status New    Target Date 08/09/21      OT LONG TERM GOAL #2   Title Pt will increase bilat grip strength by 5 or more lbs to enable improved ability to hold and carry ADL supplies.    Baseline Eval: R grip 64, L 36    Time 6    Period Weeks    Status New    Target Date 08/09/21      OT LONG TERM GOAL #3   Title Pt will increase dexterity bilaterally to  enable indep with clothing fasteners.    Baseline Eval: spouse assists with small buttons (pt has new button hook but has not yet used).    Time 6    Period Weeks    Status New    Target Date 08/09/21      OT LONG TERM GOAL #4   Title Pt will verbalize/demo 2 compensatory strategies to manage tremors to ease self feeding tasks.    Baseline Eval: education not yet intiated; pt may benefit from swivel spoon d/t reported difficulty with spoon    Time 6    Period Weeks    Status New    Target Date 08/09/21      OT LONG TERM GOAL #5   Title Pt will verbalize 1-2 strategies to manage dupuytren's contracture in bilat hands.    Baseline Eval: education not yet initiated    Time 6    Period Weeks    Status New    Target Date 08/09/21              Plan - 07/27/21 1721     Clinical Impression Statement Pt reports that his grip strength has really improved and wanting to continue to focus on FMC/dexterity tasks.  OT reinforced WB strategies throughout, as well as prehension patterns to enable pt to more easily pick up small items without using a non-skid surface.  Pt will continue to benefit from skilled OT for HEP instruction, [place] and strengthening, and ADL training in order to maximize indep with daily tasks.    OT Occupational Profile and History Problem Focused Assessment - Including review of records relating to presenting problem    Occupational performance deficits (Please refer to evaluation for details): ADL's;IADL's    Body Structure / Function / Physical Skills ADL;Coordination;UE functional use;Balance;Body mechanics;Decreased knowledge of use of DME;Flexibility;IADL;Pain;Dexterity;FMC;Strength;ROM    Rehab Potential Good    Clinical Decision Making Limited treatment options, no task modification necessary    Comorbidities Affecting Occupational Performance: May have comorbidities impacting occupational performance    Modification or Assistance to Complete Evaluation  No  modification of tasks or assist necessary to complete eval    OT Frequency 2x / week    OT Duration 6 weeks    OT Treatment/Interventions Self-care/ADL training;[place];Therapeutic exercise;DME and/or AE instruction;Balance training;Neuromuscular education;Moist Heat;Passive range of motion;Therapeutic activities;Patient/family education    Plan OT to address hand strength and coordination; pt currently  seeing PT for LE strength, balance, and mobility    OT Home Exercise Plan not yet initiated    Consulted and Agree with Plan of Care Patient             Patient will benefit from skilled therapeutic intervention in order to improve the following deficits and impairments:   Body Structure / Function / Physical Skills: ADL, Coordination, UE functional use, Balance, Body mechanics, Decreased knowledge of use of DME, Flexibility, IADL, Pain, Dexterity, FMC, Strength, ROM       Visit Diagnosis: Muscle weakness (generalized)  Other lack of coordination    Problem List Patient Active Problem List   Diagnosis Date Noted   Iron deficiency anemia 07/16/2019   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 07/16/2019   Serum potassium elevated 07/16/2019   High risk medications (not anticoagulants) long-term use 07/16/2019   Elevated LDL cholesterol level 06/25/2018   Healthcare maintenance 12/13/2017   Encounter for screening for malignant neoplasm of respiratory organs 07/12/2017   Overgrown toenails 07/12/2017   Vitamin D insufficiency 04/28/2017   Closed fracture of one rib of left side 04/28/2017   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 ([place]) 04/28/2017   Diabetes mellitus due to underlying condition without complication, with long-term current use of insulin ([facility]) 04/06/2017   Elbow swelling, left 04/06/2017   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[place]) 04/06/2017   Fatigue 04/06/2017   Hypertension associated with diabetes ([place]) 04/06/2017   Screening for AAA (abdominal aortic  aneurysm) 04/06/2017   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 [place], [place], [address] Phone: [phone]   Fax:  [phone]  Name: LELA GELL MRN: [id number] Date of Birth: 05/17/1946

## 2021-07-27 NOTE — Therapy (Signed)
[place] MAIN [facility] SERVICES [address] [place], [place], [address] Phone: [phone]   Fax:  [phone]  Physical Therapy Treatment  Patient Details  Name: Dustin Ferrell MRN: [id number] Date of Birth: 08-19-1945 Referring Provider (PT): Dr. Joselyn Arrow   Encounter Date: 07/27/2021   PT End of Session - 07/27/21 1541     Visit Number 27    Number of Visits 37    Date for PT Re-Evaluation 08/04/21    Authorization Type [employer]    Authorization Time Period 05/12/21-08/04/21    Progress Note Due on Visit 30    PT Start Time 1600    PT Stop Time 1640    PT Time Calculation (min) 40 min    Equipment Utilized During Treatment Gait belt    Activity Tolerance Patient tolerated treatment well    Behavior During Therapy [facility] for tasks assessed/performed             Past Medical History:  Diagnosis Date   Anxiety    associated with frontal temporal behavioral dementia   Basal cell carcinoma 06/03/2021   Left nasal ala - needs treatment   Dementia with behavioral disturbance    Diabetes mellitus without complication ([place])    History of traumatic head injury    Hypertension     Past Surgical History:  Procedure Laterality Date   Eye Socket fracture repair Right    HERNIA REPAIR     ROTATOR CUFF REPAIR Left    TONSILLECTOMY     WRIST FRACTURE SURGERY Right     There were no vitals filed for this visit.   Subjective Assessment - 07/27/21 1540     Subjective Patient reports having a very good day and denies any pain.    Patient is accompained by: Family member    Pertinent History Dustin Ferrell is a 29yoM who is referred to OPPT for imbalance and falls- pt also followed by Dr. Alvan Dame orthopedics for acute on chronic Left hip pain. Pt is followed by Dr. Manuella Ghazi c neurology. PMH: TBI (1981) c Rt frontal lobe involvement on valproic acid, seizures (none since 2017) on Depakote, ETOH use in remission since July 2022, Smoking, DM c  diabetic neuropathy, OSA with CPAP noncompliance, Orthostatic hypotension dysautonomic syndrome, syncope.    Limitations Standing;Walking;House hold activities    How long can you sit comfortably? no restriction    How long can you stand comfortably? 15 min    How long can you walk comfortably? 10 min    Patient Stated Goals I want to improve my balance and not fall    Currently in Pain? No/denies             INTERVENTIONS:   Neuromuscular re-ed:    Static stand with narrowed feet on BOSU (curve side up) x 5 min (multiple bouts of 1-6 sec - at best of ability to stand unsupported).   Static stand with feet on BOSU (curve side down) x 30 sec at best x multiple attempts x 3 min total.   Step up with 1 LE (4lb AW donned)  then march with opp LE into high knee position- x 12 reps each.   Cone activity- positioned 5 cones in shape of 4 corners with 1 cone in the middle- patient then perform forward walk; retro gait, forward, side step, retro  making his way through the cone pattern x 4 trials- improving overall with each pass.    Therex:  LAQ (  4 lb AW) alt LE- 12 reps with 3 sec hold and slow eccentric control   Standing donkey kick- 4lb x 12 reps each LE   Sit to stand -Staggered stance with feet - x 10 reps then switch stance and performed 10 more reps without UE Support.    Mini lunge squat onto BOSU ball (curve side up)  with 1 UE support x 12 reps each leg.    Education provided throughout session via VC/TC and demonstration to facilitate movement at target joints and correct muscle activation for all testing and exercises performed.                     PT Education - 07/27/21 1540     Education Details exercise technique    Person(s) Educated Patient    Methods Explanation;Demonstration;Tactile cues;Verbal cues    Comprehension Verbalized understanding;Returned demonstration;Tactile cues required;Verbal cues required;Need further instruction               PT Short Term Goals - 05/12/21 1040       PT SHORT TERM GOAL #1   Title Pt will be independent with HEP in order to improve strength and balance in order to decrease fall risk and improve function at home and work.    Baseline 11/15: not doing HEP right now; 12/5:  doing HEP with help of spouse every day, but without spouse has difficulty with compliance. 05/13/2021- Due to some mild cognitive issues- this goal is not attainable as he requires some supervision with home program.    Time 6    Period Weeks    Status Unable to assess    Target Date 04/01/21               PT Long Term Goals - 06/29/21 1607       PT LONG TERM GOAL #1   Title Pt will improve FOTO to target score of 60 display perceived improvements in ability to complete ADL's.    Baseline 02/18/2021= 55%, 12/5: 46%; 06/29/2021= 57%    Time 12    Period Weeks    Status On-going    Target Date 08/04/21      PT LONG TERM GOAL #2   Title Pt will decrease 5TSTS by at least 3 seconds in order to demonstrate clinically significant improvement in LE strength.    Baseline 02/18/2021= 15 sec, 11/15: 17.3 sec, 12/5: 11.19s with BUE support, 14.55s no UE support; 05/12/2021= 11:35 sec    Time 12    Period Weeks    Status Achieved    Target Date 05/13/21      PT LONG TERM GOAL #3   Title Patient will increase Functional Gait Assessment score to >22/30 as to reduce fall risk and improve dynamic gait safety with community ambulation.    Baseline 02/18/2021= 17/30, 11/15: 14/30, 12/5: 13/30; 12/14= 18/30; 06/29/2021= 19/30    Time 12    Period Weeks    Status On-going    Target Date 08/04/21      PT LONG TERM GOAL #4   Title Pt will increase 10MWT by at least 0.13 m/s in order to demonstrate clinically significant improvement in community ambulation.    Baseline 02/18/2021= 0.89 m/s without AD, 11/15: 0.97 m/s, 12/5: 1.15m/s, no AD; 06/29/2021= 1.15 m/s without an AD    Time 12    Period Weeks    Status Achieved     Target Date 08/04/21      PT LONG  TERM GOAL #5   Title Patient will report decreased left side buttock pain to less than 4/10 with transfers/mobility for improved functional mobility in home and community.    Baseline 05/12/2021= 6/10 left gluteal pain.; 06/29/2021- Patient states his gluteal pain has much improved and only rates as "sore" at times but not really painful.    Time 12    Period Weeks    Status Achieved                   Plan - 07/27/21 1541     Clinical Impression Statement Patient received with good motivation today and performed well with all balance and strengthening exercises performed. He did experience some difficulty coordinating step up and cone activity initially but vastly improved with VC and practice today. He denied any hip pain throughout session today. Patient will benefit from further skilled PT to address these deficits in order to improve safety and independence with ambulation and functional mobility    Personal Factors and Comorbidities Age;Comorbidity 3+    Comorbidities remote TBI, frontal dementia, DM    Examination-Activity Limitations Carry;Other;Locomotion Level    Examination-Participation Restrictions Community Activity;Yard Work    Stability/Clinical Decision Making Stable/Uncomplicated    Rehab Potential Good    PT Frequency 2x / week    PT Duration 12 weeks    PT Treatment/Interventions ADLs/Self Care Home Management;Cryotherapy;Moist Heat;DME Instruction;Gait training;Stair training;Functional mobility training;Therapeutic activities;Therapeutic exercise;Balance training;Neuromuscular re-education;Patient/family education;Manual techniques;Passive range of motion;Dry needling;Vestibular;Canalith Repostioning;Spinal Manipulations;Ultrasound    PT Next Visit Plan Continue with progressive LE Strengthening and balance activities for improved mobility static and dynamic balance; continue POC as previously indicated; Continue with manual  therapy for pain relief and tightness in low back/gluteal region    PT Home Exercise Plan Access Code: [email or web address]; 04/15/2021=Access Code: [email or web address]; no udpates    Consulted and Agree with Plan of Care Patient;Family member/caregiver    Family Member Consulted wife             Patient will benefit from skilled therapeutic intervention in order to improve the following deficits and impairments:  Abnormal gait, Decreased activity tolerance, Decreased balance, Decreased coordination, Decreased cognition, Decreased mobility, Decreased safety awareness, Decreased strength, Impaired perceived functional ability  Visit Diagnosis: Abnormality of gait and mobility  Difficulty in walking, not elsewhere classified  Muscle weakness (generalized)  Unsteadiness on feet     Problem List Patient Active Problem List   Diagnosis Date Noted   Iron deficiency anemia 07/16/2019   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 07/16/2019   Serum potassium elevated 07/16/2019   High risk medications (not anticoagulants) long-term use 07/16/2019   Elevated LDL cholesterol level 06/25/2018   Healthcare maintenance 12/13/2017   Encounter for screening for malignant neoplasm of respiratory organs 07/12/2017   Overgrown toenails 07/12/2017   Vitamin D insufficiency 04/28/2017   Closed fracture of one rib of left side 04/28/2017   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 ([place]) 04/28/2017   Diabetes mellitus due to underlying condition without complication, with long-term current use of insulin ([place]) 04/06/2017   Elbow swelling, left 04/06/2017   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[place]) 04/06/2017   Fatigue 04/06/2017   Hypertension associated with diabetes ([place]) 04/06/2017   Screening for AAA (abdominal aortic aneurysm) 04/06/2017   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

## 2021-07-27 NOTE — Telephone Encounter (Signed)
Left message on voicemail to return my call.  

## 2021-07-27 NOTE — Telephone Encounter (Signed)
-----   Message from Ralene Bathe, MD sent at 06/07/2021  6:15 PM EST ----- Diagnosis Skin , left nasal ala BASAL CELL CARCINOMA, NODULAR PATTERN, BASE INVOLVED  Cancer - BCC Make appt for discussion of treatment options (radiation vs surgery)

## 2021-07-29 ENCOUNTER — Telehealth: Payer: Self-pay

## 2021-07-29 ENCOUNTER — Ambulatory Visit: Payer: Medicare HMO | Attending: Internal Medicine

## 2021-07-29 ENCOUNTER — Other Ambulatory Visit: Payer: Self-pay

## 2021-07-29 DIAGNOSIS — R278 Other lack of coordination: Secondary | ICD-10-CM | POA: Insufficient documentation

## 2021-07-29 DIAGNOSIS — R269 Unspecified abnormalities of gait and mobility: Secondary | ICD-10-CM | POA: Diagnosis present

## 2021-07-29 DIAGNOSIS — M6281 Muscle weakness (generalized): Secondary | ICD-10-CM | POA: Insufficient documentation

## 2021-07-29 DIAGNOSIS — R262 Difficulty in walking, not elsewhere classified: Secondary | ICD-10-CM | POA: Diagnosis present

## 2021-07-29 DIAGNOSIS — R2681 Unsteadiness on feet: Secondary | ICD-10-CM | POA: Insufficient documentation

## 2021-07-29 NOTE — Telephone Encounter (Signed)
-----   Message from Ralene Bathe, MD sent at 06/07/2021  6:15 PM EST ----- ?Diagnosis ?Skin , left nasal ala ?BASAL CELL CARCINOMA, NODULAR PATTERN, BASE INVOLVED ? ?Cancer - BCC ?Make appt for discussion of treatment options (radiation vs surgery) ?

## 2021-07-29 NOTE — Therapy (Signed)
[place] MAIN [facility] SERVICES [address] [place], [place], [address] Phone: [phone]   Fax:  [phone]  Physical Therapy Treatment  Patient Details  Name: Dustin Ferrell MRN: [id number] Date of Birth: [birth year] Referring Provider (PT): Dr. Joselyn Arrow   Encounter Date: 07/29/2021   PT End of Session - 07/29/21 1626     Visit Number 28    Number of Visits 37    Date for PT Re-Evaluation 08/04/21    Authorization Type [employer] Medicare    Authorization Time Period 05/12/21-08/04/21    Progress Note Due on Visit 30    PT Start Time 1618    PT Stop Time 1705    PT Time Calculation (min) 47 min    Equipment Utilized During Treatment Gait belt    Activity Tolerance Patient tolerated treatment well    Behavior During Therapy [facility] for tasks assessed/performed             Past Medical History:  Diagnosis Date   Anxiety    associated with frontal temporal behavioral dementia   Basal cell carcinoma 06/03/2021   Left nasal ala - needs treatment   Dementia with behavioral disturbance    Diabetes mellitus without complication ([place])    History of traumatic head injury    Hypertension     Past Surgical History:  Procedure Laterality Date   Eye Socket fracture repair Right    HERNIA REPAIR     ROTATOR CUFF REPAIR Left    TONSILLECTOMY     WRIST FRACTURE SURGERY Right     There were no vitals filed for this visit.   Subjective Assessment - 07/29/21 2024     Subjective Patient reports doing well and wife reports she feels he is doing much better overall.    Patient is accompained by: Family member    Pertinent History Dustin Ferrell is a 10yoM who is referred to OPPT for imbalance and falls- pt also followed by Dr. Alvan Dame orthopedics for acute on chronic Left hip pain. Pt is followed by Dr. Manuella Ghazi c neurology. 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    Limitations Standing;Walking;House hold activities    How long can you sit comfortably? no restriction    How long can you stand comfortably? 15 min    How long can you walk comfortably? 10 min    Patient Stated Goals I want to improve my balance and not fall    Currently in Pain? No/denies            INTERVENTIONS:   Neuromuscular re-education: All activites today performed at support bar, using gait belt, and CGA  Step ups onto blue airex pad x 15 rep Alt LE (increased unsteadiness requiring intermittent UE support on support bar)  Dynamic Hip march- with light UE touch- x 25 reps alt LE's  Games on Hangman x 4 including initially  1) feet narrowed while standing on airex pad and moving letter accordingly 2) Feet staggered  3) Tandem 4) feet staggered  Education provided throughout session via VC/TC and demonstration to facilitate movement at target joints and correct muscle activation for all testing and exercises performed.  PT Education - 07/29/21 2025     Education Details balance technique on airex pad    Person(s) Educated Patient    Methods Explanation;Demonstration;Tactile cues;Verbal cues    Comprehension Verbalized understanding;Returned demonstration;Verbal cues required;Tactile cues required;Need further instruction              PT Short Term Goals - 05/12/21 1040       PT SHORT TERM GOAL #1   Title Pt will be independent with HEP in order to improve strength and balance in order to decrease fall risk and improve function at home and work.    Baseline 11/15: not doing HEP right now; 12/5:  doing HEP with help of spouse every day, but without spouse has difficulty with compliance. 05/13/2021- Due to some mild cognitive issues- this goal is not attainable as he requires  some supervision with home program.    Time 6    Period Weeks    Status Unable to assess    Target Date 04/01/21               PT Long Term Goals - 06/29/21 1607       PT LONG TERM GOAL #1   Title Pt will improve FOTO to target score of 60 display perceived improvements in ability to complete ADL's.    Baseline 02/18/2021= 55%, 12/5: 46%; 06/29/2021= 57%    Time 12    Period Weeks    Status On-going    Target Date 08/04/21      PT LONG TERM GOAL #2   Title Pt will decrease 5TSTS by at least 3 seconds in order to demonstrate clinically significant improvement in LE strength.    Baseline 02/18/2021= 15 sec, 11/15: 17.3 sec, 12/5: 11.19s with BUE support, 14.55s no UE support; 05/12/2021= 11:35 sec    Time 12    Period Weeks    Status Achieved    Target Date 05/13/21      PT LONG TERM GOAL #3   Title Patient will increase Functional Gait Assessment score to >22/30 as to reduce fall risk and improve dynamic gait safety with community ambulation.    Baseline 02/18/2021= 17/30, 11/15: 14/30, 12/5: 13/30; 12/14= 18/30; 06/29/2021= 19/30    Time 12    Period Weeks    Status On-going    Target Date 08/04/21      PT LONG TERM GOAL #4   Title Pt will increase 10MWT by at least 0.13 m/s in order to demonstrate clinically significant improvement in community ambulation.    Baseline 02/18/2021= 0.89 m/s without AD, 11/15: 0.97 m/s, 12/5: 1.37m/s, no AD; 06/29/2021= 1.15 m/s without an AD    Time 12    Period Weeks    Status Achieved    Target Date 08/04/21      PT LONG TERM GOAL #5   Title Patient will report decreased left side buttock pain to less than 4/10 with transfers/mobility for improved functional mobility in home and community.    Baseline 05/12/2021= 6/10 left gluteal pain.; 06/29/2021- Patient states his gluteal pain has much improved and only rates as "sore" at times but not really painful.    Time 12    Period Weeks    Status Achieved                   Plan -  07/29/21 2025     Clinical Impression Statement Patient presented today with challenged with all balance activities- He demonstrated increased unsteadiness with staggered  standing with left foot in front verus opp direction. He was able to dual task well overall and successful in completing game with some unsteadiness and occasional LOB. The dual task was challenging yet patient performed well completing the tasks presented today. Patient will benefit from further skilled PT to address these deficits in order to improve safety and independence with ambulation and functional mobility    Personal Factors and Comorbidities Age;Comorbidity 3+    Comorbidities remote TBI, frontal dementia, DM    Examination-Activity Limitations Carry;Other;Locomotion Level    Examination-Participation Restrictions Community Activity;Yard Work    Stability/Clinical Decision Making Stable/Uncomplicated    Rehab Potential Good    PT Frequency 2x / week    PT Duration 12 weeks    PT Treatment/Interventions ADLs/Self Care Home Management;Cryotherapy;Moist Heat;DME Instruction;Gait training;Stair training;Functional mobility training;Therapeutic activities;Therapeutic exercise;Balance training;Neuromuscular re-education;Patient/family education;Manual techniques;Passive range of motion;Dry needling;Vestibular;Canalith Repostioning;Spinal Manipulations;Ultrasound    PT Next Visit Plan Continue with progressive LE Strengthening and balance activities for improved mobility static and dynamic balance; continue POC as previously indicated; Continue with manual therapy for pain relief and tightness in low back/gluteal region    PT Home Exercise Plan Access Code: [email or web address]; 04/15/2021=Access Code: [email or web address]; no udpates    Consulted and Agree with Plan of Care Patient;Family member/caregiver    Family Member Consulted wife             Patient will benefit from skilled therapeutic intervention in order to improve the following  deficits and impairments:  Abnormal gait, Decreased activity tolerance, Decreased balance, Decreased coordination, Decreased cognition, Decreased mobility, Decreased safety awareness, Decreased strength, Impaired perceived functional ability  Visit Diagnosis: Abnormality of gait and mobility  Difficulty in walking, not elsewhere classified  Muscle weakness (generalized)  Unsteadiness on feet     Problem List Patient Active Problem List   Diagnosis Date Noted   Iron deficiency anemia 07/16/2019   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 07/16/2019   Serum potassium elevated 07/16/2019   High risk medications (not anticoagulants) long-term use 07/16/2019   Elevated LDL cholesterol level 06/25/2018   Healthcare maintenance 12/13/2017   Encounter for screening for malignant neoplasm of respiratory organs 07/12/2017   Overgrown toenails 07/12/2017   Vitamin D insufficiency 04/28/2017   Closed fracture of one rib of left side 04/28/2017   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 ([place]) 04/28/2017   Diabetes mellitus due to underlying condition without complication, with long-term current use of insulin ([place]) 04/06/2017   Elbow swelling, left 04/06/2017   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[place]) 04/06/2017   Fatigue 04/06/2017   Hypertension associated with diabetes ([place]) 04/06/2017   Screening for AAA (abdominal aortic aneurysm) 04/06/2017   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 [place], [place], [address] Phone: [phone]    Fax:  [phone]  Name: Dustin Ferrell MRN: [id number] Date of Birth: 19-May-1946

## 2021-07-29 NOTE — Telephone Encounter (Signed)
Advised patient of results and scheduled appointment to discuss treatment options/hd 

## 2021-08-02 ENCOUNTER — Other Ambulatory Visit: Payer: Self-pay

## 2021-08-02 ENCOUNTER — Ambulatory Visit: Payer: Medicare HMO | Admitting: Dermatology

## 2021-08-02 ENCOUNTER — Encounter: Payer: Self-pay | Admitting: Dermatology

## 2021-08-02 DIAGNOSIS — C44311 Basal cell carcinoma of skin of nose: Secondary | ICD-10-CM

## 2021-08-02 DIAGNOSIS — L578 Other skin changes due to chronic exposure to nonionizing radiation: Secondary | ICD-10-CM | POA: Diagnosis not present

## 2021-08-02 NOTE — Patient Instructions (Signed)

## 2021-08-02 NOTE — Progress Notes (Signed)
? ?  Follow-Up Visit ?  ?Subjective  ?Dustin Ferrell is a 76 y.o. male who presents for the following: Skin Cancer (Basal cell carcinoma. Bx: 06/03/2021.  Left nasal ala. Here to discuss treatment options, radiation vs surgery). ?The patient has spots, moles and lesions to be evaluated, some may be new or changing and the patient has concerns that these could be cancer. ? ?The following portions of the chart were reviewed this encounter and updated as appropriate:  Tobacco  Allergies  Meds  Problems  Med Hx  Surg Hx  Fam Hx   ?  ?Review of Systems: No other skin or systemic complaints except as noted in HPI or Assessment and Plan. ? ?Objective  ?Well appearing patient in no apparent distress; mood and affect are within normal limits. ? ?A focused examination was performed including head, including the scalp, face, neck, nose, ears, eyelids, and lips. Relevant physical exam findings are noted in the Assessment and Plan. ? ?left nasal ala ?Atrophic plaques left nasal ala with associated biopsy scar ? ? ? ? ? ?Assessment & Plan  ?Basal cell carcinoma (BCC) of skin of nose ?Biopsy-proven ?left nasal ala ?See photos today and from last visit ?Discussed with the patient and wife, and all questions fully answered.  ?Discussed Mohs vs radiation treatment.  ? ?Due to patient's H/O traumatic brain injury, frontal dementia, wife feels he would do better with radiation here in town (and not having to travel out of town and staying still for potentially hours for Mohs micrographic surgical treatment of the nose.).  ? ?Patient's wife prefers radiation therapy. Will refer to Dr. Baruch Gouty at [facility].  ? ?Actinic Damage ?- chronic, secondary to cumulative UV radiation exposure/sun exposure over time ?- diffuse scaly erythematous macules with underlying dyspigmentation ?- Recommend daily broad spectrum sunscreen SPF 30+ to sun-exposed areas, reapply every 2 hours as needed.  ?- Recommend staying in the shade or  wearing long sleeves, sun glasses (UVA+UVB protection) and wide brim hats (4-inch brim around the entire circumference of the hat). ?- Call for new or changing lesions. ? ?Return for TBSE in 6-8 months. ? ?I, Emelia Salisbury, CMA, am acting as scribe for Sarina Ser, MD. ?Documentation: I have reviewed the above documentation for accuracy and completeness, and I agree with the above. ? ?Sarina Ser, MD ? ? ?

## 2021-08-03 ENCOUNTER — Other Ambulatory Visit: Payer: Self-pay

## 2021-08-03 ENCOUNTER — Ambulatory Visit: Payer: Medicare HMO

## 2021-08-03 DIAGNOSIS — R269 Unspecified abnormalities of gait and mobility: Secondary | ICD-10-CM | POA: Diagnosis not present

## 2021-08-03 DIAGNOSIS — C44311 Basal cell carcinoma of skin of nose: Secondary | ICD-10-CM

## 2021-08-03 DIAGNOSIS — R262 Difficulty in walking, not elsewhere classified: Secondary | ICD-10-CM

## 2021-08-03 DIAGNOSIS — R278 Other lack of coordination: Secondary | ICD-10-CM

## 2021-08-03 DIAGNOSIS — M6281 Muscle weakness (generalized): Secondary | ICD-10-CM

## 2021-08-03 DIAGNOSIS — R2681 Unsteadiness on feet: Secondary | ICD-10-CM

## 2021-08-03 NOTE — Therapy (Signed)
[place] [facility] MAIN [facility] SERVICES [address] [place], [place], [address] Phone: [phone]   Fax:  [phone]  Physical Therapy Treatment  Patient Details  Name: Dustin Ferrell MRN: [id number] Date of Birth: 11/14/1945 Referring Provider (PT): Dr. Steele Sizer   Encounter Date: 08/03/2021   PT End of Session - 08/03/21 1503     Visit Number 29    Number of Visits 37    Date for PT Re-Evaluation 08/04/21    Authorization Type Aetna Medicare    Authorization Time Period 05/12/21-08/04/21    Progress Note Due on Visit 30    PT Start Time 1615    PT Stop Time 1659    PT Time Calculation (min) 44 min    Equipment Utilized During Treatment Gait belt    Activity Tolerance Patient tolerated treatment well    Behavior During Therapy [facility] for tasks assessed/performed             Past Medical History:  Diagnosis Date   Anxiety    associated with frontal temporal behavioral dementia   Basal cell carcinoma 06/03/2021   Left nasal ala - needs treatment   Dementia with behavioral disturbance    Diabetes mellitus without complication ([facility])    History of traumatic head injury    Hypertension     Past Surgical History:  Procedure Laterality Date   Eye Socket fracture repair Right    HERNIA REPAIR     ROTATOR CUFF REPAIR Left    TONSILLECTOMY     WRIST FRACTURE SURGERY Right     There were no vitals filed for this visit.   Subjective Assessment - 08/03/21 1618     Subjective I think I am getting better. Slow some days but better for sure.    Patient is accompained by: Family member    Pertinent History Dustin Ferrell is a 75yoM who is referred to OPPT for imbalance and falls- pt also followed by Dr. Charlann Boxer orthopedics for acute on chronic Left hip pain. Pt is followed by Dr. Sherryll Burger c neurology. 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    Limitations Standing;Walking;House hold activities    How long can you sit comfortably? no restriction    How long can you stand comfortably? 15 min    How long can you walk comfortably? 10 min    Patient Stated Goals I want to improve my balance and not fall    Currently in Pain? No/denies              INTREVENTIONS:   Therex:   Seated Piriformis stretch- Hold 20 sec x 3 left LE  Seated hamstring- BLE - hold 20 sec x 3 each LE  Stairs- Ascend/descend 20+ steps with 1 rail  Sit to stand x 10 reps- staggered with left LE in front then switched to perform 10  more reps.   Lunge squat onto airex pad x 12 reps without UE support  Step up onto 1st step without UE support x 12 reps each LE.   Neuromuscular re-ed:    Cones- square shape pattern- Forward/backward/diagonal/side step. X multiple trials- Difficulty with coordinating task- requiring some assist with re-direction and physical assist at time due to unsteadiness.  Cones- square with extra cone in middle of square- forward/backward/side step/diagonals  Education provided throughout session via VC/TC and demonstration to facilitate movement at target joints and correct muscle activation for all testing and exercises performed.                        PT Education - 08/04/21 1503     Education Details Exercise technique    Person(s) Educated Patient    Methods Explanation;Demonstration;Tactile cues;Verbal cues    Comprehension Verbalized understanding;Returned demonstration;Verbal cues required;Tactile cues required;Need further instruction              PT Short Term Goals - 05/12/21 1040       PT SHORT TERM GOAL #1   Title Pt will be independent with HEP in order to improve strength and balance in order to decrease fall risk and improve function at home and work.    Baseline  11/15: not doing HEP right now; 12/5:  doing HEP with help of spouse every day, but without spouse has difficulty with compliance. 05/13/2021- Due to some mild cognitive issues- this goal is not attainable as he requires some supervision with home program.    Time 6    Period Weeks    Status Unable to assess    Target Date 04/01/21               PT Long Term Goals - 06/29/21 1607       PT LONG TERM GOAL #1   Title Pt will improve FOTO to target score of 60 display perceived improvements in ability to complete ADL's.    Baseline 02/18/2021= 55%, 12/5: 46%; 06/29/2021= 57%    Time 12    Period Weeks    Status On-going    Target Date 08/04/21      PT LONG TERM GOAL #2   Title Pt will decrease 5TSTS by at least 3 seconds in order to demonstrate clinically significant improvement in LE strength.    Baseline 02/18/2021= 15 sec, 11/15: 17.3 sec, 12/5: 11.19s with BUE support, 14.55s no UE support; 05/12/2021= 11:35 sec    Time 12    Period Weeks    Status Achieved    Target Date 05/13/21      PT LONG TERM GOAL #3   Title Patient will increase Functional Gait Assessment score to >22/30 as to reduce fall risk and improve dynamic gait safety with community ambulation.    Baseline 02/18/2021= 17/30, 11/15: 14/30, 12/5: 13/30; 12/14= 18/30; 06/29/2021= 19/30    Time 12    Period Weeks    Status On-going    Target Date 08/04/21      PT LONG TERM GOAL #4   Title Pt will increase by at least 0.13 m/s in order to demonstrate clinically significant improvement in community ambulation.    Baseline 02/18/2021= 0.89 m/s without AD, 11/15: 0.97 m/s, 12/5: 1.32m/s, no AD; 06/29/2021= 1.15 m/s without an AD    Time 12    Period Weeks    Status Achieved    Target Date 08/04/21      PT LONG TERM GOAL #5   Title Patient will report decreased left side buttock pain to less than 4/10 with transfers/mobility for improved functional mobility in home and community.    Baseline 05/12/2021= 6/10 left  gluteal pain.; 06/29/2021- Patient states his gluteal pain has much improved and only rates as "sore" at times but not really painful.    Time 12    Period Weeks    Status Achieved  Plan - 08/03/21 1504     Clinical Impression Statement Patient presents today with good ability to perform steps with use of railing. Patient demo safe technique with no impulsivity with steps today. He was challenged with cone activities - requiring multidirectional cues and quick gait changes. He was fatigued at end of session and some confusion with just verbal cues  today requiring more visual aid to complete tasks. Patient will benefit from further skilled PT to address these deficits in order to improve safety and independence with ambulation and functional mobility    Personal Factors and Comorbidities Age;Comorbidity 3+    Comorbidities remote TBI, frontal dementia, DM    Examination-Activity Limitations Carry;Other;Locomotion Level    Examination-Participation Restrictions Community Activity;Yard Work    Stability/Clinical Decision Making Stable/Uncomplicated    Rehab Potential Good    PT Frequency 2x / week    PT Duration 12 weeks    PT Treatment/Interventions ADLs/Self Care Home Management;Cryotherapy;Moist Heat;DME Instruction;Gait training;Stair training;Functional mobility training;Therapeutic activities;Therapeutic exercise;Balance training;Neuromuscular re-education;Patient/family education;Manual techniques;Passive range of motion;Dry needling;Vestibular;Canalith Repostioning;Spinal Manipulations;Ultrasound    PT Next Visit Plan Continue with progressive LE Strengthening and balance activities for improved mobility static and dynamic balance; continue POC as previously indicated; Continue with manual therapy for pain relief and tightness in low back/gluteal region    PT Home Exercise Plan Access Code: [email or web address]; 04/15/2021=Access Code: [email or web address]; no udpates    Consulted and  Agree with Plan of Care Patient;Family member/caregiver    Family Member Consulted wife             Patient will benefit from skilled therapeutic intervention in order to improve the following deficits and impairments:  Abnormal gait, Decreased activity tolerance, Decreased balance, Decreased coordination, Decreased cognition, Decreased mobility, Decreased safety awareness, Decreased strength, Impaired perceived functional ability  Visit Diagnosis: Abnormality of gait and mobility  Difficulty in walking, not elsewhere classified  Muscle weakness (generalized)  Other lack of coordination  Unsteadiness on feet     Problem List Patient Active Problem List   Diagnosis Date Noted   Iron deficiency anemia 07/16/2019   Adrenal nodule (HCC)- left 07/16/2019   Serum potassium elevated 07/16/2019   High risk medications (not anticoagulants) long-term use 07/16/2019   Elevated LDL cholesterol level 06/25/2018   Healthcare maintenance 12/13/2017   Encounter for screening for malignant neoplasm of respiratory organs 07/12/2017   Overgrown toenails 07/12/2017   Vitamin D insufficiency 04/28/2017   Closed fracture of one rib of left side 04/28/2017   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) 04/28/2017   Diabetes mellitus due to underlying condition without complication, with long-term current use of insulin ([facility]CC) 04/06/2017   Elbow swelling, left 04/06/2017   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HCC) 04/06/2017   Fatigue 04/06/2017   Hypertension associated with diabetes ([facility]CC) 04/06/2017   Screening for AAA (abdominal aortic aneurysm) 04/06/2017   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

## 2021-08-03 NOTE — Progress Notes (Signed)
Referral to radiation oncology with Dr. Baruch Gouty for treatment. Referral sent over.  ?

## 2021-08-03 NOTE — Therapy (Signed)
[place] [facility] SERVICES [place], [place], [address] Phone: [phone]   Fax:  [phone]  Occupational Therapy Treatment  Patient Details  Name: Dustin Ferrell MRN: [id number] Date of Birth: 1945/07/16 Referring Provider (OT): Dr. Manuella Ghazi   Encounter Date: 08/03/2021   OT End of Session - 08/03/21 1525     Visit Number 8    Number of Visits 12    Date for OT Re-Evaluation 08/10/21    OT Start Time 1520    OT Stop Time 1600    OT Time Calculation (min) 40 min    Activity Tolerance Patient tolerated treatment well    Behavior During Therapy [facility] for tasks assessed/performed             Past Medical History:  Diagnosis Date   Anxiety    associated with frontal temporal behavioral dementia   Basal cell carcinoma 06/03/2021   Left nasal ala - needs treatment   Dementia with behavioral disturbance    Diabetes mellitus without complication ([place])    History of traumatic head injury    Hypertension     Past Surgical History:  Procedure Laterality Date   Eye Socket fracture repair Right    HERNIA REPAIR     ROTATOR CUFF REPAIR Left    TONSILLECTOMY     WRIST FRACTURE SURGERY Right     There were no vitals filed for this visit.   Subjective Assessment - 08/03/21 1518     Subjective  Pt reports doing well today.    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, seizure, alcohol use disorder, decreased balance with hx of falling, DM2, diabetic neuropathy, essential tremors    Limitations weakness, balance impairment    Patient Stated Goals "Improve my hand strength and coordination."    Pain Score 1     Pain Location Hand    Pain Orientation Left    Pain Descriptors / Indicators Aching    Pain Type Chronic pain    Pain Onset More than a month ago    Pain Frequency Intermittent    Aggravating Factors  general discomfort from arthritis and Dupuytren's    Pain Relieving Factors rest,  heat, stretching    Effect of Pain on Daily Activities limited tolerance for gripping    Multiple Pain Sites No    Pain Onset More than a month ago            Occupational Therapy Treatment: [place]: Bilat hands x10 min for pain reduction/muscle relaxation in prep for therapeutic exercise.  Therapeutic Exercise: Participated in coin manipulation activities with coin pick up from table, storage of coins within palm of hand, transferring coins from palm of hand to fingertips to enable discarding coins one at a time without dropping into slotted bank.  Intermittent cues to pick up coins using tip of IF and thumb vs lateral aspect of IF, with pt demonstrating better precision when using tip.  Pt worked with [employer] peg pieces for increasing dexterity, requiring non skid surface to pick up spacers, but otherwise able to pick up pegs and washers from dish on board. L hand with increased difficulty to place spacers, despite vc for technique.  Transitioned to placement of spacers with use of wide/resistive tweezers for better accuracy.  Overall L hand requires increased time and attempts for accuracy as this hand is more affected by Dupuytren's which has further affected pt's coordination.   Response to Treatment: See  Plan/clinical impression below.     OT Education - 08/03/21 1525     Education Details HEP    Person(s) Educated Patient    Methods Explanation;Verbal cues;Tactile cues;Demonstration    Comprehension Verbalized understanding;Verbal cues required;Need further instruction;Returned demonstration              OT Short Term Goals - 06/30/21 0858       OT SHORT TERM GOAL #1   Title Pt will be indep with HEP for increasing strength and coordination in bilat hands    Baseline Eval: not yet initiated    Time 3    Period Weeks    Status New    Target Date 07/19/21               OT Long Term Goals - 06/30/21 0859       OT LONG TERM GOAL #1   Title Pt will increase  FOTO score by 2 or more points to indicate increased functional performance    Baseline Eval: FOTO: 58    Time 6    Period Weeks    Status New    Target Date 08/09/21      OT LONG TERM GOAL #2   Title Pt will increase bilat grip strength by 5 or more lbs to enable improved ability to hold and carry ADL supplies.    Baseline Eval: R grip 64, L 36    Time 6    Period Weeks    Status New    Target Date 08/09/21      OT LONG TERM GOAL #3   Title Pt will increase dexterity bilaterally to enable indep with clothing fasteners.    Baseline Eval: spouse assists with small buttons (pt has new button hook but has not yet used).    Time 6    Period Weeks    Status New    Target Date 08/09/21      OT LONG TERM GOAL #4   Title Pt will verbalize/demo 2 compensatory strategies to manage tremors to ease self feeding tasks.    Baseline Eval: education not yet intiated; pt may benefit from swivel spoon d/t reported difficulty with spoon    Time 6    Period Weeks    Status New    Target Date 08/09/21      OT LONG TERM GOAL #5   Title Pt will verbalize 1-2 strategies to manage dupuytren's contracture in bilat hands.    Baseline Eval: education not yet initiated    Time 6    Period Weeks    Status New    Target Date 08/09/21              Plan - 08/03/21 0829     Clinical Impression Statement Good tolerance to paraffin to bilat hands for pain management.  Pt with good tolerance to activities this day to promote increased dexterity.  Overall L hand requires increased time and attempts for accuracy as this hand is more affected by Dupuytren's which has further affected pt's coordination.  Pt requires intermittent cues for rest and prehension patterns for better accuracy with dexterity tasks.  Pt will continue to benefit from skilled OT for HEP instruction, [place] and strengthening, and ADL training in order to maximize indep with daily tasks.    OT Occupational Profile and History Problem Focused  Assessment - Including review of records relating to presenting problem    Occupational performance deficits (Please refer to evaluation for details): ADL's;IADL's  Body Structure / Function / Physical Skills ADL;Coordination;UE functional use;Balance;Body mechanics;Decreased knowledge of use of DME;Flexibility;IADL;Pain;Dexterity;FMC;Strength;ROM    Rehab Potential Good    Clinical Decision Making Limited treatment options, no task modification necessary    Comorbidities Affecting Occupational Performance: May have comorbidities impacting occupational performance    Modification or Assistance to Complete Evaluation  No modification of tasks or assist necessary to complete eval    OT Frequency 2x / week    OT Duration 6 weeks    OT Treatment/Interventions Self-care/ADL training;Paraffin;Therapeutic exercise;DME and/or AE instruction;Balance training;Neuromuscular education;Moist Heat;Passive range of motion;Therapeutic activities;Patient/family education    Plan OT to address hand strength and coordination; pt currently seeing PT for LE strength, balance, and mobility    OT Home Exercise Plan not yet initiated    Consulted and Agree with Plan of Care Patient             Patient will benefit from skilled therapeutic intervention in order to improve the following deficits and impairments:   Body Structure / Function / Physical Skills: ADL, Coordination, UE functional use, Balance, Body mechanics, Decreased knowledge of use of DME, Flexibility, IADL, Pain, Dexterity, FMC, Strength, ROM       Visit Diagnosis: Muscle weakness (generalized)  Other lack of coordination    Problem List Patient Active Problem List   Diagnosis Date Noted   Iron deficiency anemia 07/16/2019   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 07/16/2019   Serum potassium elevated 07/16/2019   High risk medications (not anticoagulants) long-term use 07/16/2019   Elevated LDL cholesterol level 06/25/2018   Healthcare  maintenance 12/13/2017   Encounter for screening for malignant neoplasm of respiratory organs 07/12/2017   Overgrown toenails 07/12/2017   Vitamin D insufficiency 04/28/2017   Closed fracture of one rib of left side 04/28/2017   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 ([place]) 04/28/2017   Diabetes mellitus due to underlying condition without complication, with long-term current use of insulin ([facility]) 04/06/2017   Elbow swelling, left 04/06/2017   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[place]) 04/06/2017   Fatigue 04/06/2017   Hypertension associated with diabetes ([place]) 04/06/2017   Screening for AAA (abdominal aortic aneurysm) 04/06/2017   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

## 2021-08-05 ENCOUNTER — Other Ambulatory Visit: Payer: Self-pay

## 2021-08-05 ENCOUNTER — Ambulatory Visit: Payer: Medicare HMO

## 2021-08-05 DIAGNOSIS — R269 Unspecified abnormalities of gait and mobility: Secondary | ICD-10-CM | POA: Diagnosis not present

## 2021-08-05 DIAGNOSIS — M6281 Muscle weakness (generalized): Secondary | ICD-10-CM

## 2021-08-05 DIAGNOSIS — R2681 Unsteadiness on feet: Secondary | ICD-10-CM

## 2021-08-05 DIAGNOSIS — R278 Other lack of coordination: Secondary | ICD-10-CM

## 2021-08-05 DIAGNOSIS — R262 Difficulty in walking, not elsewhere classified: Secondary | ICD-10-CM

## 2021-08-05 NOTE — Therapy (Signed)
[place] [facility] SERVICES [address] [place], [place], [address] Phone: [phone]   Fax:  [phone]  Physical Therapy Treatment/Recertification for dates 08/05/2021- 10/28/2021/Physical Therapy Progress Note   Dates of reporting period  06/29/2021  to   08/05/2021  Patient Details  Name: CLAYBORNE DIVIS MRN: [id number] Date of Birth: [birth year] Referring Provider (PT): Dr. Joselyn Arrow   Encounter Date: 08/05/2021   PT End of Session - 08/05/21 1603     Visit Number 30    Number of Visits 37    Date for PT Re-Evaluation 10/28/21    Authorization Type [employer]    Authorization Time Period 05/12/21-08/04/21; 08/05/2021-10/28/2021    Progress Note Due on Visit 30    PT Start Time 1600    PT Stop Time 1645    PT Time Calculation (min) 45 min    Equipment Utilized During Treatment Gait belt    Activity Tolerance Patient tolerated treatment well    Behavior During Therapy [facility] for tasks assessed/performed             Past Medical History:  Diagnosis Date   Anxiety    associated with frontal temporal behavioral dementia   Basal cell carcinoma 06/03/2021   Left nasal ala - needs treatment   Dementia with behavioral disturbance    Diabetes mellitus without complication ([place])    History of traumatic head injury    Hypertension     Past Surgical History:  Procedure Laterality Date   Eye Socket fracture repair Right    HERNIA REPAIR     ROTATOR CUFF REPAIR Left    TONSILLECTOMY     WRIST FRACTURE SURGERY Right     There were no vitals filed for this visit.   Subjective Assessment - 08/05/21 1603     Subjective Patient reports doing okay- Denies any falls. Reports his balance still needs work and he would like to continue and reports highly motivated to walk better.    Patient is accompained by: Family member    Pertinent History Juriel Cid is a 1yoM who is referred to OPPT for imbalance and falls- pt also followed by Dr.  Alvan Dame orthopedics for acute on chronic Left hip pain. Pt is followed by Dr. Manuella Ghazi c neurology. PMH: TBI (1981) c Rt frontal lobe involvement on valproic acid, seizures (none since 2017) on Depakote, ETOH use in remission since July 2022, Smoking, DM c diabetic neuropathy, OSA with CPAP noncompliance, Orthostatic hypotension dysautonomic syndrome, syncope.    Limitations Standing;Walking;House hold activities    How long can you sit comfortably? no restriction    How long can you stand comfortably? 15 min    How long can you walk comfortably? 10 min    Patient Stated Goals I want to improve my balance and not fall    Currently in Pain? No/denies                [facility] PT Assessment - 08/05/21 1622       Functional Gait  Assessment   Gait assessed  Yes    Gait Level Surface Walks 20 ft in less than 5.5 sec, no assistive devices, good speed, no evidence for imbalance, normal gait pattern, deviates no more than 6 in outside of the 12 in walkway width.    Change in Gait Speed Makes only minor adjustments to walking speed, or accomplishes a change in speed with significant gait deviations, deviates 10-15 in outside the 12 in walkway  width, or changes speed but loses balance but is able to recover and continue walking.    Gait with Horizontal Head Turns Performs head turns with moderate changes in gait velocity, slows down, deviates 10-15 in outside 12 in walkway width but recovers, can continue to walk.    Gait with Vertical Head Turns Performs task with slight change in gait velocity (eg, minor disruption to smooth gait path), deviates 6 - 10 in outside 12 in walkway width or uses assistive device    Gait and Pivot Turn Pivot turns safely within 3 sec and stops quickly with no loss of balance.    Step Over Obstacle Is able to step over 2 stacked shoe boxes taped together (9 in total height) without changing gait speed. No evidence of imbalance.    Gait with Narrow Base of Support Ambulates 4-7  steps.    Gait with Eyes Closed Walks 20 ft, uses assistive device, slower speed, mild gait deviations, deviates 6-10 in outside 12 in walkway width. Ambulates 20 ft in less than 9 sec but greater than 7 sec.    Ambulating Backwards Walks 20 ft, uses assistive device, slower speed, mild gait deviations, deviates 6-10 in outside 12 in walkway width.    Steps Alternating feet, must use rail.    Total Score 20             INTERVENTIONS:   Reassessment of goals for Recert/Progress note    Long term goals:   FOTO= 57% (Ongoing)   FGA= 20/30 (Ongoing)   Single leg stance= Time on each foot - Left= 2 sec; right = 1 sec (NEW GOAL to be able to stand consistently > 5 sec each leg)    Neuro re-ed  Side stepping on TM (biodex )  at 0.4 mph x 2 min to left and another 2 min to right (mild increase difficulty stepping to left side) - limited today secondary to fatigue.   [place] activities 1) walking around cones strategically placed including forward/backward/diagonal/sidestepping, walking through narrow walkway and stepping over cones.   2) Connect the dot (placing sticky notes with numbers on top) patient walked through a maze of cones walking from numerically oriented 1-10 numbered cones for visual acuity, coordination, ability to squat, and balance. (Patient was unsteady and mild difficulty coordinating task but no significant LOB during activity).                    PT Education - 08/05/21 1659     Education Details Exercise technique    Person(s) Educated Patient    Methods Explanation;Demonstration;Tactile cues;Verbal cues    Comprehension Verbalized understanding;Returned demonstration;Verbal cues required;Tactile cues required;Need further instruction              PT Short Term Goals - 05/12/21 1040       PT SHORT TERM GOAL #1   Title Pt will be independent with HEP in order to improve strength and balance in order to decrease fall risk and improve function  at home and work.    Baseline 11/15: not doing HEP right now; 12/5:  doing HEP with help of spouse every day, but without spouse has difficulty with compliance. 05/13/2021- Due to some mild cognitive issues- this goal is not attainable as he requires some supervision with home program.    Time 6    Period Weeks    Status Unable to assess    Target Date 04/01/21  PT Long Term Goals - 08/05/21 1606       PT LONG TERM GOAL #1   Title Pt will improve FOTO to target score of 60 display perceived improvements in ability to complete ADL's.    Baseline 02/18/2021= 55%, 12/5: 46%; 06/29/2021= 57%; 08/05/2021= 57%    Time 12    Period Weeks    Status On-going    Target Date 10/28/21      PT LONG TERM GOAL #2   Title Pt will decrease 5TSTS by at least 3 seconds in order to demonstrate clinically significant improvement in LE strength.    Baseline 02/18/2021= 15 sec, 11/15: 17.3 sec, 12/5: 11.19s with BUE support, 14.55s no UE support; 05/12/2021= 11:35 sec    Time 12    Period Weeks    Status Achieved    Target Date 05/13/21      PT LONG TERM GOAL #3   Title Patient will increase Functional Gait Assessment score to >22/30 as to reduce fall risk and improve dynamic gait safety with community ambulation.    Baseline 02/18/2021= 17/30, 11/15: 14/30, 12/5: 13/30; 12/14= 18/30; 06/29/2021= 19/30; 08/05/2021= 20/30    Time 12    Period Weeks    Status On-going    Target Date 10/28/21      PT LONG TERM GOAL #4   Title Pt will increase 10MWT by at least 0.13 m/s in order to demonstrate clinically significant improvement in community ambulation.    Baseline 02/18/2021= 0.89 m/s without AD, 11/15: 0.97 m/s, 12/5: 1.37ms, no AD; 06/29/2021= 1.15 m/s without an AD    Time 12    Period Weeks    Status Achieved    Target Date 08/04/21      PT LONG TERM GOAL #5   Title Patient will report decreased left side buttock pain to less than 4/10 with transfers/mobility for improved functional  mobility in home and community.    Baseline 05/12/2021= 6/10 left gluteal pain.; 06/29/2021- Patient states his gluteal pain has much improved and only rates as "sore" at times but not really painful.    Time 12    Period Weeks    Status Achieved      Additional Long Term Goals   Additional Long Term Goals Yes      PT LONG TERM GOAL #6   Title Patient will demonstrate improved static standing balance as seen by ability to single leg stand 5 sec or greater to assist with balance with functional mobility in the community.    Baseline Time on each foot - Left= 2 sec; right = 1 sec    Time 12    Period Weeks    Status New    Target Date 10/28/21                   Plan - 08/05/21 1604     Clinical Impression Statement Patient presented today with good motivation for today's recert visit. He continues to improve overall- Denying any recent falls and scoring higher on FGA. He is still considered at risk for falling and added single leg stance goal to continue to focus on his balance with plan to transition to home program when goals met. Patient's condition has the potential to improve in response to therapy. Maximum improvement is yet to be obtained. The anticipated improvement is attainable and reasonable in a generally predictable time.    Personal Factors and Comorbidities Age;Comorbidity 3+    Comorbidities remote TBI, frontal dementia, DM  Examination-Activity Limitations Carry;Other;Locomotion Level    Examination-Participation Restrictions Community Activity;Yard Work    Stability/Clinical Decision Making Stable/Uncomplicated    Rehab Potential Good    PT Frequency 1x / week    PT Duration 12 weeks    PT Treatment/Interventions ADLs/Self Care Home Management;Cryotherapy;Moist Heat;DME Instruction;Gait training;Stair training;Functional mobility training;Therapeutic activities;Therapeutic exercise;Balance training;Neuromuscular re-education;Patient/family education;Manual  techniques;Passive range of motion;Dry needling;Vestibular;Canalith Repostioning;Spinal Manipulations;Ultrasound    PT Next Visit Plan Continue with progressive LE Strengthening and balance activities for improved mobility static and dynamic balance. Focus on SLS and weight shifting/dual task activities.    PT Home Exercise Plan Access Code: [email or web address]; 04/15/2021=Access Code: [email or web address]; no udpates    Consulted and Agree with Plan of Care Patient    Family Member Consulted wife             Patient will benefit from skilled therapeutic intervention in order to improve the following deficits and impairments:  Abnormal gait, Decreased activity tolerance, Decreased balance, Decreased coordination, Decreased cognition, Decreased mobility, Decreased safety awareness, Decreased strength, Impaired perceived functional ability  Visit Diagnosis: Abnormality of gait and mobility - Plan: PT plan of care cert/re-cert  Difficulty in walking, not elsewhere classified - Plan: PT plan of care cert/re-cert  Muscle weakness (generalized) - Plan: PT plan of care cert/re-cert  Unsteadiness on feet - Plan: PT plan of care cert/re-cert     Problem List Patient Active Problem List   Diagnosis Date Noted   Iron deficiency anemia 07/16/2019   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 07/16/2019   Serum potassium elevated 07/16/2019   High risk medications (not anticoagulants) long-term use 07/16/2019   Elevated LDL cholesterol level 06/25/2018   Healthcare maintenance 12/13/2017   Encounter for screening for malignant neoplasm of respiratory organs 07/12/2017   Overgrown toenails 07/12/2017   Vitamin D insufficiency 04/28/2017   Closed fracture of one rib of left side 04/28/2017   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 ([place]) 04/28/2017   Diabetes mellitus due to underlying condition without complication, with long-term current use of insulin ([place]) 04/06/2017   Elbow swelling, left 04/06/2017    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[place]) 04/06/2017   Fatigue 04/06/2017   Hypertension associated with diabetes ([place]) 04/06/2017   Screening for AAA (abdominal aortic aneurysm) 04/06/2017   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 [place], [place], [address] Phone: [phone]   Fax:  [phone]  Name: ADOLFO GRANIERI MRN: [id number] Date of Birth: 1946/05/27

## 2021-08-06 NOTE — Therapy (Signed)
[place] MAIN [facility] SERVICES [place], [place], [address] Phone: [phone]   Fax:  [phone]  Occupational Therapy Discharge  Patient Details  Name: Dustin Ferrell MRN: [id number] Date of Birth: 19-Feb-1946 Referring Provider (OT): Dr. Manuella Ghazi   Encounter Date: 08/05/2021   OT End of Session - 08/06/21 0952     Visit Number 9    Number of Visits 12    Date for OT Re-Evaluation 08/10/21    OT Start Time 1515    OT Stop Time 1600    OT Time Calculation (min) 45 min    Activity Tolerance Patient tolerated treatment well    Behavior During Therapy [employer] for tasks assessed/performed             Past Medical History:  Diagnosis Date   Anxiety    associated with frontal temporal behavioral dementia   Basal cell carcinoma 06/03/2021   Left nasal ala - needs treatment   Dementia with behavioral disturbance    Diabetes mellitus without complication ([place])    History of traumatic head injury    Hypertension     Past Surgical History:  Procedure Laterality Date   Eye Socket fracture repair Right    HERNIA REPAIR     ROTATOR CUFF REPAIR Left    TONSILLECTOMY     WRIST FRACTURE SURGERY Right     There were no vitals filed for this visit.   Subjective Assessment - 08/05/21 0949     Subjective  Pt in agreement with plans for discharge today.    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 Yes    Pain Score 1     Pain Location Hand    Pain Orientation Left    Pain Descriptors / Indicators Aching;Tightness    Pain Type Chronic pain    Pain Onset More than a month ago    Pain Frequency Intermittent    Aggravating Factors  general discomfort from  arthiritis and Dupuytren's    Pain Relieving Factors rest, heat, stretching    Effect of Pain on Daily Activities limited tolerance for gripping    Multiple Pain Sites No    Pain Onset More than a month ago             [facility] OT Assessment - 08/06/21 0001       Observation/Other Assessments   Focus on Therapeutic Outcomes (FOTO)  63      Coordination   Right 9 Hole Peg Test 41 sec    Left 9 Hole Peg Test 41 sec      Hand Function   Right Hand Grip (lbs) 66    Right Hand Lateral Pinch 19 lbs    Right Hand 3 Point Pinch 19 lbs    Left Hand Grip (lbs) 43    Left Hand Lateral Pinch 11 lbs    Left 3 point pinch 12 lbs             [facility] OT Assessment - 08/06/21 0001       Observation/Other Assessments   Focus on Therapeutic Outcomes (FOTO)  63      Coordination   Right 9 Hole Peg Test 41 sec    Left 9  Hole Peg Test 41 sec      Hand Function   Right Hand Grip (lbs) 66    Right Hand Lateral Pinch 19 lbs    Right Hand 3 Point Pinch 19 lbs    Left Hand Grip (lbs) 43    Left Hand Lateral Pinch 11 lbs    Left 3 point pinch 12 lbs             [facility] OT Assessment - 08/06/21 0001       Observation/Other Assessments   Focus on Therapeutic Outcomes (FOTO)  63      Coordination   Right 9 Hole Peg Test 41 sec    Left 9 Hole Peg Test 41 sec      Hand Function   Right Hand Grip (lbs) 66    Right Hand Lateral Pinch 19 lbs    Right Hand 3 Point Pinch 19 lbs    Left Hand Grip (lbs) 43    Left Hand Lateral Pinch 11 lbs    Left 3 point pinch 12 lbs             [facility] OT Assessment - 08/06/21 0001       Observation/Other Assessments   Focus on Therapeutic Outcomes (FOTO)  63      Coordination   Right 9 Hole Peg Test 41 sec    Left 9 Hole Peg Test 41 sec      Hand Function   Right Hand Grip (lbs) 66    Right Hand Lateral Pinch 19 lbs    Right Hand 3 Point Pinch 19 lbs    Left Hand Grip (lbs) 43    Left Hand Lateral Pinch 11 lbs    Left 3 point pinch 12  lbs             Occupational Therapy Treatment: [place]: Bilat hands x10 min for pain reduction/muscle relaxation following therapeutic exercise.   Therapeutic Exercise: Facilitated FMC/dexterity activity for bilat hands, working to place grooved pegs into pegboard.  Pt requiring initial max vc to line up grooves and improve prehension with pegs using a tripod grasp; pt then able to perform task with good accuracy/extra time.  Objective measures taken/goals updated.  OT reviewed HEP, joint protection strategies, and strategies to manage tremors during self feeding and [facility] tasks.  Pt is indep with all and reports good carry over at home.     [facility] OT Assessment - 08/06/21 0001       Observation/Other Assessments   Focus on Therapeutic Outcomes (FOTO)  63      Coordination   Right 9 Hole Peg Test 41 sec    Left 9 Hole Peg Test 41 sec      Hand Function   Right Hand Grip (lbs) 66    Right Hand Lateral Pinch 19 lbs    Right Hand 3 Point Pinch 19 lbs    Left Hand Grip (lbs) 43    Left Hand Lateral Pinch 11 lbs    Left 3 point pinch 12 lbs           Response to tx: Pt has been seen for 9 OT sessions to address bilat hand pain, decreased strength and coordination in bilat hands related to arthritis and Dupuytren's contracture in bilat hands, L worse than R.  Pt has been educated in HEP progression, joint protection strategies, AE for ADLs, and strategies to manage BUE tremors during self care.  All goals sufficiently met.  Pt has  made gains with bilat hand strength and dexterity (see note), and is now managing small buttons on shirt with modified indep using button hook.  No additional skilled OT indicated at this time.  Pt in agreement with plan.    OT Education - 08/05/21 0951     Education Details HEP review/joint protection strategies    Person(s) Educated Patient    Methods Explanation;Verbal cues;Tactile cues;Demonstration    Comprehension Verbalized understanding;Returned  demonstration              OT Short Term Goals - 08/05/21 0953       OT SHORT TERM GOAL #1   Title Pt will be indep with HEP for increasing strength and coordination in bilat hands    Baseline Eval: not yet initiated; 08/05/21 d/c: indep with HEP    Time 3    Period Weeks    Status Achieved    Target Date 07/19/21               OT Long Term Goals - 08/05/21 0953       OT LONG TERM GOAL #1   Title Pt will increase FOTO score by 2 or more points to indicate increased functional performance    Baseline Eval: FOTO: 58; 08/05/21 d/c: FOTO 63    Time 6    Period Weeks    Status Achieved    Target Date 08/09/21      OT LONG TERM GOAL #2   Title Pt will increase bilat grip strength by 5 or more lbs to enable improved ability to hold and carry ADL supplies.    Baseline Eval: R grip 64, L 36; 08/05/21 d/c: R grip 66, L 43    Time 6    Period Weeks    Status Partially Met    Target Date 08/09/21      OT LONG TERM GOAL #3   Title Pt will increase dexterity bilaterally to enable indep with clothing fasteners.    Baseline Eval: spouse assists with small buttons (pt has new button hook but has not yet used); 08/05/21 d/c: Pt is modified indep with clothing fasteners (now using button hook independently)    Time 6    Period Weeks    Status Achieved    Target Date 08/09/21      OT LONG TERM GOAL #4   Title Pt will verbalize/demo 2 compensatory strategies to manage tremors to ease self feeding tasks.    Baseline Eval: education not yet intiated; pt may benefit from swivel spoon d/t reported difficulty with spoon; 08/05/21 d/c: indep with compensatory strategies    Time 6    Period Weeks    Status Achieved    Target Date 08/09/21      OT LONG TERM GOAL #5   Title Pt will verbalize 1-2 strategies to manage dupuytren's contracture in bilat hands.    Baseline Eval: education not yet initiated; 08/05/21 d/c: Pt indep to verbalize 2 joint protection strategies    Time 6    Period Weeks     Status Achieved    Target Date 08/09/21              Plan - 08/05/21 1007     Clinical Impression Statement Pt has been seen for 9 OT sessions to address bilat hand pain, decreased strength and coordination in bilat hands related to arthritis and Dupuytren's contracture in bilat hands, L worse than R.  Pt has been educated in HEP progression, joint  protection strategies, AE for ADLs, and strategies to manage BUE tremors during self care.  All goals sufficiently met.  Pt has made gains with bilat hand strength and dexterity (see note), and is now managing small buttons on shirt with modified indep using button hook.  No additional skilled OT indicated at this time.  Pt in agreement with plan.    OT Occupational Profile and History Problem Focused Assessment - Including review of records relating to presenting problem    Occupational performance deficits (Please refer to evaluation for details): ADL's;IADL's    Body Structure / Function / Physical Skills ADL;Coordination;UE functional use;Balance;Body mechanics;Decreased knowledge of use of DME;Flexibility;IADL;Pain;Dexterity;FMC;Strength;ROM    Rehab Potential Good    Clinical Decision Making Limited treatment options, no task modification necessary    Comorbidities Affecting Occupational Performance: May have comorbidities impacting occupational performance    Modification or Assistance to Complete Evaluation  No modification of tasks or assist necessary to complete eval    OT Frequency 2x / week    OT Duration 6 weeks    OT Treatment/Interventions Self-care/ADL training;Paraffin;Therapeutic exercise;DME and/or AE instruction;Balance training;Neuromuscular education;Moist Heat;Passive range of motion;Therapeutic activities;Patient/family education    Plan OT to address hand strength and coordination; pt currently seeing PT for LE strength, balance, and mobility    OT Home Exercise Plan not yet initiated    Consulted and Agree with Plan of  Care Patient             Patient will benefit from skilled therapeutic intervention in order to improve the following deficits and impairments:   Body Structure / Function / Physical Skills: ADL, Coordination, UE functional use, Balance, Body mechanics, Decreased knowledge of use of DME, Flexibility, IADL, Pain, Dexterity, FMC, Strength, ROM       Visit Diagnosis: Muscle weakness (generalized)  Other lack of coordination    Problem List Patient Active Problem List   Diagnosis Date Noted   Iron deficiency anemia 07/16/2019   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 07/16/2019   Serum potassium elevated 07/16/2019   High risk medications (not anticoagulants) long-term use 07/16/2019   Elevated LDL cholesterol level 06/25/2018   Healthcare maintenance 12/13/2017   Encounter for screening for malignant neoplasm of respiratory organs 07/12/2017   Overgrown toenails 07/12/2017   Vitamin D insufficiency 04/28/2017   Closed fracture of one rib of left side 04/28/2017   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 ([place]) 04/28/2017   Diabetes mellitus due to underlying condition without complication, with long-term current use of insulin ([facility]) 04/06/2017   Elbow swelling, left 04/06/2017   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[place]) 04/06/2017   Fatigue 04/06/2017   Hypertension associated with diabetes ([place]) 04/06/2017   Screening for AAA (abdominal aortic aneurysm) 04/06/2017   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

## 2021-08-10 ENCOUNTER — Other Ambulatory Visit: Payer: Self-pay

## 2021-08-10 ENCOUNTER — Ambulatory Visit: Payer: Medicare HMO

## 2021-08-10 DIAGNOSIS — R278 Other lack of coordination: Secondary | ICD-10-CM

## 2021-08-10 DIAGNOSIS — R269 Unspecified abnormalities of gait and mobility: Secondary | ICD-10-CM | POA: Diagnosis not present

## 2021-08-10 DIAGNOSIS — M6281 Muscle weakness (generalized): Secondary | ICD-10-CM

## 2021-08-10 DIAGNOSIS — R262 Difficulty in walking, not elsewhere classified: Secondary | ICD-10-CM

## 2021-08-10 DIAGNOSIS — R2681 Unsteadiness on feet: Secondary | ICD-10-CM

## 2021-08-10 NOTE — Therapy (Signed)
[place] [facility] [facility] ?[place], [place], [address] ?Phone: [phone]   Fax:  [phone] ? ?Physical Therapy Treatment ? ?Patient Details  ?Name: Dustin Ferrell ?MRN: [id number] ?Date of Birth: [birth year] ?Referring Provider (PT): Dr. Joselyn Arrow ? ? ?Encounter Date: 08/10/2021 ? ? PT End of Session - 08/10/21 1615   ? ? Visit Number 31   ? Number of Visits 37   ? Date for PT Re-Evaluation 10/28/21   ? Authorization Type [employer]   ? Authorization Time Period 05/12/21-08/04/21; 08/05/2021-10/28/2021   ? Progress Note Due on Visit 30   ? PT Start Time 1611   ? PT Stop Time 2725   ? PT Time Calculation (min) 42 min   ? Equipment Utilized During Treatment Gait belt   ? Activity Tolerance Patient tolerated treatment well   ? Behavior During Therapy [facility] for tasks assessed/performed   ? ?  ?  ? ?  ? ? ?Past Medical History:  ?Diagnosis Date  ? Anxiety   ? associated with frontal temporal behavioral dementia  ? Basal cell carcinoma 06/03/2021  ? Left nasal ala - needs treatment  ? Dementia with behavioral disturbance   ? Diabetes mellitus without complication ([place])   ? History of traumatic head injury   ? Hypertension   ? ? ?Past Surgical History:  ?Procedure Laterality Date  ? Eye Socket fracture repair Right   ? HERNIA REPAIR    ? ROTATOR CUFF REPAIR Left   ? TONSILLECTOMY    ? WRIST FRACTURE SURGERY Right   ? ? ?There were no vitals filed for this visit. ? ? Subjective Assessment - 08/10/21 1614   ? ? Subjective Patient reports doing well overall and reports having a really good day   ? Patient is accompained by: Family member   ? Pertinent History Dustin Ferrell is a 38yoM who is referred to OPPT for imbalance and falls- pt also followed by Dr. Alvan Dame orthopedics for acute on chronic Left hip pain. Pt is followed by Dr. Manuella Ghazi c neurology. 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   ? Limitations Standing;Walking;House hold activities   ? How long can you sit comfortably? no restriction   ? How long can you stand comfortably? 15 min   ? How long can you walk comfortably? 10 min   ? Patient Stated Goals I want to improve my balance and not fall   ? Currently in Pain? No/denies   ? ?  ?  ? ?  ? ? ? ? ? ?INTERVENTIONS:  ? ?Neuromuscular re-ed:  ? ?Tandem stance on 1/2 foam- attempting to hold up to 10 sec- very difficult- multiple attempts at best 10 sec; at worst 1-2 sec ? ?Obstacle course including narrowed walk through cones. Figure 8 around cones; forward/backward/side step around cones; up/down/over 4" step; on airex beam and tandem over 1/2 foam; squat to reach cone - 5 trips around course- Increased difficulty with beam and tandem and coordinating around cones.  ? ?Hangman game with feet narrowed - No significant LOB- using ankle righting strategies with min VC ?Hangman game with feet staggered - More difficulty with increased sway ? ?Sit to stand x 10 reps without UE Support ? ?Standing donkey kicks BLE x 12 reps ? ?Single leg  calf raises- difficult but able to perform 12 reps each  ? ?Education provided throughout session via VC/TC and demonstration to facilitate movement at target joints and correct muscle activation for all testing and exercises performed.  ? ? ? ? ? ? ? ? ? ? ? ? ? ? ? ? ? ? ? ? ? ? ? ? PT Education - 08/10/21 1615   ? ? Education Details Exercise technique   ? Person(s) Educated Patient   ? Methods Explanation;Demonstration;Tactile cues;Verbal cues   ? Comprehension Verbalized understanding;Returned demonstration;Verbal cues required;Tactile cues required;Need further instruction;Other (comment)   ? ?  ?  ? ?  ? ? ? PT Short Term Goals - 05/12/21 1040   ? ?  ? PT SHORT TERM GOAL #1  ? Title Pt will be independent with HEP in order  to improve strength and balance in order to decrease fall risk and improve function at home and work.   ? Baseline 11/15: not doing HEP right now; 12/5:  doing HEP with help of spouse every day, but without spouse has difficulty with compliance. 05/13/2021- Due to some mild cognitive issues- this goal is not attainable as he requires some supervision with home program.   ? Time 6   ? Period Weeks   ? Status Unable to assess   ? Target Date 04/01/21   ? ?  ?  ? ?  ? ? ? ? PT Long Term Goals - 08/05/21 1606   ? ?  ? PT LONG TERM GOAL #1  ? Title Pt will improve FOTO to target score of 60 display perceived improvements in ability to complete ADL's.   ? Baseline 02/18/2021= 55%, 12/5: 46%; 06/29/2021= 57%; 08/05/2021= 57%   ? Time 12   ? Period Weeks   ? Status On-going   ? Target Date 10/28/21   ?  ? PT LONG TERM GOAL #2  ? Title Pt will decrease 5TSTS by at least 3 seconds in order to demonstrate clinically significant improvement in LE strength.   ? Baseline 02/18/2021= 15 sec, 11/15: 17.3 sec, 12/5: 11.19s with BUE support, 14.55s no UE support; 05/12/2021= 11:35 sec   ? Time 12   ? Period Weeks   ? Status Achieved   ? Target Date 05/13/21   ?  ? PT LONG TERM GOAL #3  ? Title Patient will increase Functional Gait Assessment score to >22/30 as to reduce fall risk and improve dynamic gait safety with community ambulation.   ? Baseline 02/18/2021= 17/30, 11/15: 14/30, 12/5: 13/30; 12/14= 18/30; 06/29/2021= 19/30; 08/05/2021= 20/30   ? Time 12   ? Period Weeks   ? Status On-going   ? Target Date 10/28/21   ?  ? PT LONG TERM GOAL #4  ? Title Pt will increase 10MWT by at least 0.13 m/s in order to demonstrate clinically significant improvement in community ambulation.   ? Baseline 02/18/2021= 0.89 m/s without AD, 11/15: 0.97 m/s, 12/5: 1.16ms, no AD; 06/29/2021= 1.15 m/s without an AD   ? Time 12   ? Period Weeks   ? Status Achieved   ? Target Date 08/04/21   ?  ? PT LONG TERM GOAL #5  ? Title Patient will report decreased left  side buttock pain to less than 4/10 with transfers/mobility for improved functional mobility in home and community.   ? Baseline 05/12/2021= 6/10 left gluteal pain.; 06/29/2021- Patient states his gluteal pain has much improved and only rates as "sore" at times  but not really painful.   ? Time 12   ? Period Weeks   ? Status Achieved   ?  ? Additional Long Term Goals  ? Additional Long Term Goals Yes   ?  ? PT LONG TERM GOAL #6  ? Title Patient will demonstrate improved static standing balance as seen by ability to single leg stand 5 sec or greater to assist with balance with functional mobility in the community.   ? Baseline Time on each foot - Left= 2 sec; right = 1 sec   ? Time 12   ? Period Weeks   ? Status New   ? Target Date 10/28/21   ? ?  ?  ? ?  ? ? ? ? ? ? ? ? Plan - 08/10/21 1620   ? ? Clinical Impression Statement Patient presented with good motivation and participation with balance focused program today. He did exhibit some difficulty with obstacle course with narrowed walk or any tandem. He did improve some with practice and repetition today. No significant LOB with dual task Hangman game today. Patient will benefit from further skilled PT to address these deficits in order to improve safety and independence with ambulation and functional mobility   ? Personal Factors and Comorbidities Age;Comorbidity 3+   ? Comorbidities remote TBI, frontal dementia, DM   ? Examination-Activity Limitations Carry;Other;Locomotion Level   ? Examination-Participation Restrictions Community Activity;[employer] Work   ? Stability/Clinical Decision Making Stable/Uncomplicated   ? Rehab Potential Good   ? PT Frequency 1x / week   ? PT Duration 12 weeks   ? PT Treatment/Interventions ADLs/Self Care Home Management;Cryotherapy;Moist Heat;DME Instruction;Gait training;Stair training;Functional mobility training;Therapeutic activities;Therapeutic exercise;Balance training;Neuromuscular re-education;Patient/family education;Manual  techniques;Passive range of motion;Dry needling;Vestibular;Canalith Repostioning;Spinal Manipulations;Ultrasound   ? PT Next Visit Plan Continue with progressive LE Strengthening and balance activities for impr

## 2021-08-11 ENCOUNTER — Encounter: Payer: Self-pay | Admitting: Radiation Oncology

## 2021-08-11 ENCOUNTER — Ambulatory Visit
Admission: RE | Admit: 2021-08-11 | Discharge: 2021-08-11 | Disposition: A | Discharge status: Home / Self Care | Payer: Medicare HMO | Source: Ambulatory Visit | Attending: Radiation Oncology | Admitting: Radiation Oncology

## 2021-08-11 DIAGNOSIS — Z85828 Personal history of other malignant neoplasm of skin: Secondary | ICD-10-CM | POA: Diagnosis not present

## 2021-08-11 DIAGNOSIS — Z794 Long term (current) use of insulin: Secondary | ICD-10-CM | POA: Diagnosis not present

## 2021-08-11 DIAGNOSIS — Z800 Family history of malignant neoplasm of digestive organs: Secondary | ICD-10-CM | POA: Diagnosis not present

## 2021-08-11 DIAGNOSIS — Z7982 Long term (current) use of aspirin: Secondary | ICD-10-CM | POA: Insufficient documentation

## 2021-08-11 DIAGNOSIS — Z79899 Other long term (current) drug therapy: Secondary | ICD-10-CM | POA: Insufficient documentation

## 2021-08-11 DIAGNOSIS — I10 Essential (primary) hypertension: Secondary | ICD-10-CM | POA: Insufficient documentation

## 2021-08-11 DIAGNOSIS — F419 Anxiety disorder, unspecified: Secondary | ICD-10-CM | POA: Diagnosis not present

## 2021-08-11 DIAGNOSIS — F0390 Unspecified dementia without behavioral disturbance: Secondary | ICD-10-CM | POA: Diagnosis not present

## 2021-08-11 DIAGNOSIS — C44311 Basal cell carcinoma of skin of nose: Secondary | ICD-10-CM | POA: Diagnosis present

## 2021-08-11 DIAGNOSIS — E119 Type 2 diabetes mellitus without complications: Secondary | ICD-10-CM | POA: Insufficient documentation

## 2021-08-11 DIAGNOSIS — F17210 Nicotine dependence, cigarettes, uncomplicated: Secondary | ICD-10-CM | POA: Diagnosis not present

## 2021-08-11 NOTE — Consult Note (Signed)
?NEW PATIENT EVALUATION ? ?Name: Dustin Ferrell  ?MRN: [id number]  ?Date:   08/11/2021     ?DOB: 1946/04/07 ? ? ?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 ? ?REFERRING PHYSICIAN: Adin Hector, MD ? ?CHIEF COMPLAINT:  ?Chief Complaint  ?Patient presents with  ? Cancer  ?  Initial consult skin cancer  ? ? ?DIAGNOSIS: The encounter diagnosis was Basal cell carcinoma of nose. ?  ?PREVIOUS INVESTIGATIONS:  ?Pathology reports reviewed ?Clinical notes reviewed ?CT of chest reviewed ? ?HPI: Patient is a 76 year old male presented with a nodular growth on the left aspect of the tip of his nose.  Seen by dermatology and biopsy showed basal cell carcinoma nodular type.  Patient is completely asymptomatic.  Had a recent CT scan of his lung for screening purposes showing no evidence of significant disease.  Accompanied by his wife today. ? ?PLANNED TREATMENT REGIMEN: Electron-beam treatment ? ?PAST MEDICAL HISTORY:  has a past medical history of Anxiety, Basal cell carcinoma (06/03/2021), Dementia with behavioral disturbance, Diabetes mellitus without complication ([place]), History of traumatic head injury, and Hypertension.   ? ?PAST SURGICAL HISTORY:  ?Past Surgical History:  ?Procedure Laterality Date  ? Eye Socket fracture repair Right   ? HERNIA REPAIR    ? ROTATOR CUFF REPAIR Left   ? TONSILLECTOMY    ? WRIST FRACTURE SURGERY Right   ? ? ?FAMILY HISTORY: family history includes Diabetes in his father; Heart attack in his father; Stomach cancer in his mother. ? ?SOCIAL HISTORY:  reports that he has been smoking cigarettes. He has a 60.00 pack-year smoking history. He has never used smokeless tobacco. He reports current alcohol use of about 18.0 standard drinks per week. He reports that he does not use drugs. ? ?ALLERGIES: Patient has no known allergies. ? ?MEDICATIONS:  ?Current Outpatient Medications  ?Medication Sig Dispense Refill   ? amLODipine (NORVASC) 5 MG tablet Take 1 tablet (5 mg total) by mouth daily. (if BP not at goal) 90 tablet 0  ? aspirin EC 81 MG tablet Take 81 mg by mouth daily.    ? atorvastatin (LIPITOR) 10 MG tablet TAKE 1 TABLET BY MOUTH EVERY DAY 90 tablet 3  ? divalproex (DEPAKOTE ER) 500 MG 24 hr tablet Take 1 tablet (500 mg total) by mouth 2 (two) times daily. (Patient taking differently: Take 1,000 mg by mouth 2 (two) times daily.) 180 tablet 0  ? ferrous sulfate 325 (65 FE) MG EC tablet Take 1 tablet (325 mg total) by mouth 2 (two) times daily with a meal. 90 tablet 11  ? glucose blood test strip Use to check blood sugars every morning fasting and 2 hours after largest meal 100 each 12  ? insulin degludec (TRESIBA FLEXTOUCH) 100 UNIT/ML SOPN FlexTouch Pen Inject 0.1 mLs (10 Units total) into the skin daily. 15 mL 6  ? Lancets (ACCU-CHEK SOFT TOUCH) lancets Use to check blood sugars every morning fasting and 2 hours after largest meal 100 each 12  ? omeprazole (PRILOSEC) 20 MG capsule TAKE 1 CAPSULE BY MOUTH EVERY EVENING 30 MINUTES PRIOR TO DINNER 90 capsule 1  ? sertraline (ZOLOFT) 100 MG tablet Take 200 mg daily by mouth.    ? sitaGLIPtin (JANUVIA) 100 MG tablet TAKE 1 TABLET (100 MG TOTAL) DAILY BY MOUTH. 90 tablet 0  ? ?No current facility-administered medications for this encounter.  ? ? ?ECOG PERFORMANCE STATUS:  0 - Asymptomatic ? ?REVIEW OF SYSTEMS: ?Patient denies any weight loss, fatigue, weakness, fever, chills or night sweats. Patient denies any loss of vision, blurred vision. Patient denies any ringing  of the ears or hearing loss. No irregular heartbeat. Patient denies heart murmur or history of fainting. Patient denies any chest pain or pain radiating to her upper extremities. Patient denies any shortness of breath, difficulty breathing at night, cough or hemoptysis. Patient denies any swelling in the lower legs. Patient denies any nausea vomiting, vomiting of blood, or coffee ground material in the  vomitus. Patient denies any stomach pain. Patient states has had normal bowel movements no significant constipation or diarrhea. Patient denies any dysuria, hematuria or significant nocturia. Patient denies any problems walking, swelling in the joints or loss of balance. Patient denies any skin changes, loss of hair or loss of weight. Patient denies any excessive worrying or anxiety or significant depression. Patient denies any problems with insomnia. Patient denies excessive thirst, polyuria, polydipsia. Patient denies any swollen glands, patient denies easy bruising or easy bleeding. Patient denies any recent infections, allergies or URI. Patient "s visual fields have not changed significantly in recent time. ?  ?PHYSICAL EXAM: ?BP (!) (P) 144/70 (BP Location: Left Arm, Patient Position: Sitting)   Pulse (P) 73   Temp (!) (P) 96.9 ?F (36.1 ?C) (Tympanic)   Resp (P) 16   Wt (P) 160 lb 9.6 oz (72.8 kg)   BMI (P) 25.92 kg/m?  ?Small nodular growth on the left nose tip.  No evidence of preauricular submandibular cervical or supraclavicular adenopathy is identified.  Well-developed well-nourished patient in NAD. HEENT reveals PERLA, EOMI, discs not visualized.  Oral cavity is clear. No oral mucosal lesions are identified. Neck is clear without evidence of cervical or supraclavicular adenopathy. Lungs are clear to A&P. Cardiac examination is essentially unremarkable with regular rate and rhythm without murmur rub or thrill. Abdomen is benign with no organomegaly or masses noted. Motor sensory and DTR levels are equal and symmetric in the upper and lower extremities. Cranial nerves II through XII are grossly intact. Proprioception is intact. No peripheral adenopathy or edema is identified. No motor or sensory levels are noted. Crude visual fields are within normal range. ? ?LABORATORY DATA: Pathology reports reviewed ? ?  ?RADIOLOGY RESULTS: Lung screening CT scans reviewed compatible with above-stated  findings ? ? ?IMPRESSION: Nodular basal cell carcinoma of the left nose tip in 76 year old male ? ?PLAN: At this time I have recommended electron-beam therapy with plan delivering 50 Gray over 5 weeks.  Risks and benefits of treatment including skin reaction possible fatigue all were reviewed with the patient.  We will set him up for CT simulation next week.  Patient and wife both comprehend my recommendations well. ? ?I would like to take this opportunity to thank you for allowing me to participate in the care of your patient.. ? ?Noreene Filbert, MD ? ? ? ? ? ? ? ? ?

## 2021-08-12 ENCOUNTER — Ambulatory Visit: Payer: Medicare HMO

## 2021-08-12 ENCOUNTER — Telehealth: Payer: Self-pay | Admitting: *Deleted

## 2021-08-12 ENCOUNTER — Encounter: Payer: Medicare HMO | Admitting: Occupational Therapy

## 2021-08-12 NOTE — Telephone Encounter (Signed)
Wife called and left a scheduling msg. Requesting call back from [employer] team regarding her husband's apt in radiation. She is not able to do the sims at 03/1129 as previous planned and needs late afternoon for future radiation apts (requested 4/430pm time on msg). She has a previous obligation and is unable to come at the previous said time.  ? ?Msg sent to [facility] and Kim in Radiation to coordinate pt's care. ?

## 2021-08-17 ENCOUNTER — Ambulatory Visit: Payer: Medicare HMO

## 2021-08-17 ENCOUNTER — Other Ambulatory Visit: Payer: Self-pay

## 2021-08-17 DIAGNOSIS — M6281 Muscle weakness (generalized): Secondary | ICD-10-CM

## 2021-08-17 DIAGNOSIS — R2681 Unsteadiness on feet: Secondary | ICD-10-CM

## 2021-08-17 DIAGNOSIS — R278 Other lack of coordination: Secondary | ICD-10-CM

## 2021-08-17 DIAGNOSIS — R262 Difficulty in walking, not elsewhere classified: Secondary | ICD-10-CM

## 2021-08-17 DIAGNOSIS — R269 Unspecified abnormalities of gait and mobility: Secondary | ICD-10-CM | POA: Diagnosis not present

## 2021-08-17 NOTE — Therapy (Signed)
[facility] ?[place], [place], [address] ?Phone: [phone]   Fax:  [phone] ? ?Physical Therapy Treatment ? ?Patient Details  ?Name: Dustin Ferrell ?MRN: [id number] ?Date of Birth: 1945-10-17 ?Referring Provider (PT): Dr. Joselyn Arrow ? ? ?Encounter Date: 08/17/2021 ? ? ? ?Past Medical History:  ?Diagnosis Date  ? Anxiety   ? associated with frontal temporal behavioral dementia  ? Basal cell carcinoma 06/03/2021  ? Left nasal ala - needs treatment  ? Dementia with behavioral disturbance   ? Diabetes mellitus without complication ([place])   ? History of traumatic head injury   ? Hypertension   ? ? ?Past Surgical History:  ?Procedure Laterality Date  ? Eye Socket fracture repair Right   ? HERNIA REPAIR    ? ROTATOR CUFF REPAIR Left   ? TONSILLECTOMY    ? WRIST FRACTURE SURGERY Right   ? ? ?There were no vitals filed for this visit. ? ? ? ?INTERVENTIONS:  ? ?Forward/backward stepping over 1/2 foam x 15 reps without UE support. ? ?Sidestepping over 1/2 foam x 15 reps without UE support - minimal LOB. ? ?Side stepping on 1/2 foam (Curve side up) x length of foam x 10 each direction.  ? ?KORE balance machine: follow the ball- 3 trials- at 30 sec each- Patient had to hold UE support for balance.  ? ?KORE- Playing 2 games of 2048- standing on platform while using touchscreen - very unsteady- reaching for UE support.  ? ?Dynamic stepping- weaving around 6 cones - sidestep/forward/backward ? ?Education provided throughout session via VC/TC and demonstration to facilitate movement at target joints and correct muscle activation for all testing and exercises performed.  ? ? ? ? ? ? ? ? ? ? ? ? ? ? ? ? ? ? ? ? ? ? ? ? ? ? PT Education - 08/19/21 0817   ? ? Education Details Exercise technique   ? Person(s) Educated Patient   ? Methods Explanation;Demonstration;Tactile cues;Verbal cues   ? Comprehension Verbalized understanding;Returned demonstration;Verbal cues  required;Tactile cues required;Need further instruction   ? ?  ?  ? ?  ? ? ? PT Short Term Goals - 05/12/21 1040   ? ?  ? PT SHORT TERM GOAL #1  ? Title Pt will be independent with HEP in order to improve strength and balance in order to decrease fall risk and improve function at home and work.   ? Baseline 11/15: not doing HEP right now; 12/5:  doing HEP with help of spouse every day, but without spouse has difficulty with compliance. 05/13/2021- Due to some mild cognitive issues- this goal is not attainable as he requires some supervision with home program.   ? Time 6   ? Period Weeks   ? Status Unable to assess   ? Target Date 04/01/21   ? ?  ?  ? ?  ? ? ? ? PT Long Term Goals - 08/05/21 1606   ? ?  ? PT LONG TERM GOAL #1  ? Title Pt will improve FOTO to target score of 60 display perceived improvements in ability to complete ADL's.   ? Baseline 02/18/2021= 55%, 12/5: 46%; 06/29/2021= 57%; 08/05/2021= 57%   ? Time 12   ? Period Weeks   ? Status On-going   ? Target Date 10/28/21   ?  ? PT LONG TERM GOAL #2  ? Title Pt will decrease 5TSTS by at least 3 seconds in order  to demonstrate clinically significant improvement in LE strength.   ? Baseline 02/18/2021= 15 sec, 11/15: 17.3 sec, 12/5: 11.19s with BUE support, 14.55s no UE support; 05/12/2021= 11:35 sec   ? Time 12   ? Period Weeks   ? Status Achieved   ? Target Date 05/13/21   ?  ? PT LONG TERM GOAL #3  ? Title Patient will increase Functional Gait Assessment score to >22/30 as to reduce fall risk and improve dynamic gait safety with community ambulation.   ? Baseline 02/18/2021= 17/30, 11/15: 14/30, 12/5: 13/30; 12/14= 18/30; 06/29/2021= 19/30; 08/05/2021= 20/30   ? Time 12   ? Period Weeks   ? Status On-going   ? Target Date 10/28/21   ?  ? PT LONG TERM GOAL #4  ? Title Pt will increase 10MWT by at least 0.13 m/s in order to demonstrate clinically significant improvement in community ambulation.   ? Baseline 02/18/2021= 0.89 m/s without AD, 11/15: 0.97 m/s, 12/5:  1.41ms, no AD; 06/29/2021= 1.15 m/s without an AD   ? Time 12   ? Period Weeks   ? Status Achieved   ? Target Date 08/04/21   ?  ? PT LONG TERM GOAL #5  ? Title Patient will report decreased left side buttock pain to less than 4/10 with transfers/mobility for improved functional mobility in home and community.   ? Baseline 05/12/2021= 6/10 left gluteal pain.; 06/29/2021- Patient states his gluteal pain has much improved and only rates as "sore" at times but not really painful.   ? Time 12   ? Period Weeks   ? Status Achieved   ?  ? Additional Long Term Goals  ? Additional Long Term Goals Yes   ?  ? PT LONG TERM GOAL #6  ? Title Patient will demonstrate improved static standing balance as seen by ability to single leg stand 5 sec or greater to assist with balance with functional mobility in the community.   ? Baseline Time on each foot - Left= 2 sec; right = 1 sec   ? Time 12   ? Period Weeks   ? Status New   ? Target Date 10/28/21   ? ?  ?  ? ?  ? ? ? ? ? ? ? ? ? ?Patient will benefit from skilled therapeutic intervention in order to improve the following deficits and impairments:  Abnormal gait, Decreased activity tolerance, Decreased balance, Decreased coordination, Decreased cognition, Decreased mobility, Decreased safety awareness, Decreased strength, Impaired perceived functional ability ? ?Visit Diagnosis: ?Abnormality of gait and mobility ? ?Difficulty in walking, not elsewhere classified ? ?Muscle weakness (generalized) ? ?Other lack of coordination ? ?Unsteadiness on feet ? ? ? ? ?Problem List ?Patient Active Problem List  ? Diagnosis Date Noted  ? Iron deficiency anemia 07/16/2019  ? Adrenal nodule ([place]- left 07/16/2019  ? Serum potassium elevated 07/16/2019  ? High risk medications (not anticoagulants) long-term use 07/16/2019  ? Elevated LDL cholesterol level 06/25/2018  ? Healthcare maintenance 12/13/2017  ? Encounter for screening for malignant neoplasm of respiratory organs 07/12/2017  ? Overgrown  toenails 07/12/2017  ? Vitamin D insufficiency 04/28/2017  ? Closed fracture of one rib of left side 04/28/2017  ? 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 Diabetes mellitus due to underlying condition without complication, with long-term current use of insulin ([place] 04/06/2017  ? Elbow swelling, left 04/06/2017  ? 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[facility] 04/06/2017  ? Fatigue 04/06/2017  ? Hypertension  associated with diabetes ([place]) 04/06/2017  ? Screening for AAA (abdominal aortic aneurysm) 04/06/2017  ? 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 Neurological deficit, transient 10/15/2014  ? Obstructive sleep apnea- noncompliance w txmnt 10/15/2014  ? Syncope 10/03/2014  ? 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  ? Colon polyps 07/07/2014  ? Personal history of traumatic brain injury 07/07/2014  ? ? ?Lewis Moccasin, PT ?08/19/2021, 8:28 AM ? ?[facility] ?[place], [place], [address] ?Phone: [phone]   Fax:  [phone] ? ?Name: Dustin Ferrell ?MRN: [id number] ?Date of Birth: [birth year] ? ? ? ?

## 2021-08-19 ENCOUNTER — Ambulatory Visit
Admission: RE | Admit: 2021-08-19 | Discharge: 2021-08-19 | Disposition: A | Discharge status: Home / Self Care | Payer: Medicare HMO | Source: Ambulatory Visit | Attending: Radiation Oncology | Admitting: Radiation Oncology

## 2021-08-19 ENCOUNTER — Ambulatory Visit: Payer: Medicare HMO

## 2021-08-19 DIAGNOSIS — C44311 Basal cell carcinoma of skin of nose: Secondary | ICD-10-CM | POA: Diagnosis present

## 2021-08-19 DIAGNOSIS — Z510 Encounter for antineoplastic radiation therapy: Secondary | ICD-10-CM | POA: Insufficient documentation

## 2021-08-24 DIAGNOSIS — Z510 Encounter for antineoplastic radiation therapy: Secondary | ICD-10-CM | POA: Diagnosis not present

## 2021-08-26 ENCOUNTER — Ambulatory Visit
Admission: RE | Admit: 2021-08-26 | Discharge: 2021-08-26 | Disposition: A | Discharge status: Home / Self Care | Payer: Medicare HMO | Source: Ambulatory Visit | Attending: Radiation Oncology | Admitting: Radiation Oncology

## 2021-08-26 DIAGNOSIS — Z510 Encounter for antineoplastic radiation therapy: Secondary | ICD-10-CM | POA: Diagnosis not present

## 2021-08-27 ENCOUNTER — Ambulatory Visit
Admission: RE | Admit: 2021-08-27 | Discharge: 2021-08-27 | Disposition: A | Discharge status: Home / Self Care | Payer: Medicare HMO | Source: Ambulatory Visit | Attending: Radiation Oncology | Admitting: Radiation Oncology

## 2021-08-27 DIAGNOSIS — Z510 Encounter for antineoplastic radiation therapy: Secondary | ICD-10-CM | POA: Diagnosis not present

## 2021-08-30 ENCOUNTER — Ambulatory Visit
Admission: RE | Admit: 2021-08-30 | Discharge: 2021-08-30 | Disposition: A | Discharge status: Home / Self Care | Payer: Medicare HMO | Source: Ambulatory Visit | Attending: Radiation Oncology | Admitting: Radiation Oncology

## 2021-08-30 DIAGNOSIS — C44311 Basal cell carcinoma of skin of nose: Secondary | ICD-10-CM | POA: Diagnosis present

## 2021-08-30 DIAGNOSIS — Z510 Encounter for antineoplastic radiation therapy: Secondary | ICD-10-CM | POA: Insufficient documentation

## 2021-08-31 ENCOUNTER — Ambulatory Visit: Payer: Medicare HMO | Attending: Neurology

## 2021-08-31 ENCOUNTER — Telehealth: Payer: Self-pay | Admitting: *Deleted

## 2021-08-31 ENCOUNTER — Ambulatory Visit
Admission: RE | Admit: 2021-08-31 | Discharge: 2021-08-31 | Disposition: A | Discharge status: Home / Self Care | Payer: Medicare HMO | Source: Ambulatory Visit | Attending: Radiation Oncology | Admitting: Radiation Oncology

## 2021-08-31 DIAGNOSIS — R2681 Unsteadiness on feet: Secondary | ICD-10-CM | POA: Insufficient documentation

## 2021-08-31 DIAGNOSIS — R278 Other lack of coordination: Secondary | ICD-10-CM | POA: Diagnosis present

## 2021-08-31 DIAGNOSIS — R2689 Other abnormalities of gait and mobility: Secondary | ICD-10-CM | POA: Insufficient documentation

## 2021-08-31 DIAGNOSIS — R269 Unspecified abnormalities of gait and mobility: Secondary | ICD-10-CM | POA: Diagnosis present

## 2021-08-31 DIAGNOSIS — M6281 Muscle weakness (generalized): Secondary | ICD-10-CM | POA: Insufficient documentation

## 2021-08-31 DIAGNOSIS — R262 Difficulty in walking, not elsewhere classified: Secondary | ICD-10-CM | POA: Insufficient documentation

## 2021-08-31 DIAGNOSIS — Z510 Encounter for antineoplastic radiation therapy: Secondary | ICD-10-CM | POA: Diagnosis not present

## 2021-08-31 NOTE — Therapy (Signed)
[facility] ?[place], [place], [address] ?Phone: [phone]   Fax:  [phone] ? ?Physical Therapy Treatment ? ?Patient Details  ?Name: Dustin Ferrell ?MRN: [id number] ?Date of Birth: May 26, 1946 ?Referring Provider (PT): Dr. Joselyn Arrow ? ? ?Encounter Date: 08/31/2021 ? ? PT End of Session - 08/31/21 1620   ? ? Visit Number 33   ? Number of Visits 37   ? Date for PT Re-Evaluation 10/28/21   ? Authorization Type [employer]   ? Authorization Time Period 05/12/21-08/04/21; 08/05/2021-10/28/2021   ? Progress Note Due on Visit 40   ? PT Start Time [id number]   ? PT Stop Time 1655   ? PT Time Calculation (min) 42 min   ? Equipment Utilized During Treatment Gait belt   ? Activity Tolerance Patient tolerated treatment well   ? Behavior During Therapy [facility] for tasks assessed/performed   ? ?  ?  ? ?  ? ? ?Past Medical History:  ?Diagnosis Date  ? Anxiety   ? associated with frontal temporal behavioral dementia  ? Basal cell carcinoma 06/03/2021  ? Left nasal ala - needs treatment  ? Dementia with behavioral disturbance ([place])   ? Diabetes mellitus without complication ([place])   ? History of traumatic head injury   ? Hypertension   ? ? ?Past Surgical History:  ?Procedure Laterality Date  ? Eye Socket fracture repair Right   ? HERNIA REPAIR    ? ROTATOR CUFF REPAIR Left   ? TONSILLECTOMY    ? WRIST FRACTURE SURGERY Right   ? ? ?There were no vitals filed for this visit. ? ? Subjective Assessment - 08/31/21 1614   ? ? Subjective Patient reports somedays he doesn't do much as it it hot but reports walking more and denies any falls.   ? Patient is accompained by: Family member   ? Pertinent History Linda Biehn is a 56yoM who is referred to OPPT for imbalance and falls- pt also followed by Dr. Alvan Dame orthopedics for acute on chronic Left hip pain. Pt is followed by Dr. Manuella Ghazi c neurology. 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   ? Limitations Standing;Walking;House hold activities   ? How long can you sit comfortably? no restriction   ? How long can you stand comfortably? 15 min   ? How long can you walk comfortably? 10 min   ? Patient Stated Goals I want to improve my balance and not fall   ? Currently in Pain? No/denies   ? ?  ?  ? ?  ? ? ? ? ?INTERVENTIONS:  ? ?Neuromuscular re-ed:  ? ?Ladder drills - forward- x length of bars and back x 10 ?Ladder drills- Side step x length of bars and back x 10 ?Ladder drills - Forward x length of bars and back using 3lb AW x 5  ?Ladder drills - Side step x length of bars and back using 3lb AW x 5 ? ?Step ups onto 6"block x 12 reps without UE support  ? ?Hamstring curls - BTB x 15 reps each leg ? ?Cone work - 4 corners- Forward, diagonals, side step and backward in and around 4 cones in various combinations upon command.  ? ? ? ?pass the ball (trunk twist to left and right  and diagnonals) x 12 reps each direction ? ? ?Gait in hallway with horizontal Head turns x 80 feet x 4  ? ?Education provided throughout session via VC/TC and demonstration to facilitate movement at target joints and correct muscle activation for all testing and exercises performed.  ? ? ? ? ? ? ? ? ? ? ? ? ? ? ? ? ? ? ? ? ? ? ? PT Education - 08/31/21 1620   ? ? Education Details Exercise technique   ? Person(s) Educated Patient   ? Methods Explanation;Tactile cues;Verbal cues;Demonstration   ? Comprehension Verbalized understanding;Returned demonstration;Verbal cues required;Tactile cues required;Need further instruction   ? ?  ?  ? ?  ? ? ? PT Short Term Goals - 05/12/21 1040   ? ?  ? PT SHORT TERM GOAL #1  ? Title Pt will be independent with HEP in order to improve strength and balance in order to decrease fall risk and improve function at home and work.   ?  Baseline 11/15: not doing HEP right now; 12/5:  doing HEP with help of spouse every day, but without spouse has difficulty with compliance. 05/13/2021- Due to some mild cognitive issues- this goal is not attainable as he requires some supervision with home program.   ? Time 6   ? Period Weeks   ? Status Unable to assess   ? Target Date 04/01/21   ? ?  ?  ? ?  ? ? ? ? PT Long Term Goals - 08/05/21 1606   ? ?  ? PT LONG TERM GOAL #1  ? Title Pt will improve FOTO to target score of 60 display perceived improvements in ability to complete ADL's.   ? Baseline 02/18/2021= 55%, 12/5: 46%; 06/29/2021= 57%; 08/05/2021= 57%   ? Time 12   ? Period Weeks   ? Status On-going   ? Target Date 10/28/21   ?  ? PT LONG TERM GOAL #2  ? Title Pt will decrease 5TSTS by at least 3 seconds in order to demonstrate clinically significant improvement in LE strength.   ? Baseline 02/18/2021= 15 sec, 11/15: 17.3 sec, 12/5: 11.19s with BUE support, 14.55s no UE support; 05/12/2021= 11:35 sec   ? Time 12   ? Period Weeks   ? Status Achieved   ? Target Date 05/13/21   ?  ? PT LONG TERM GOAL #3  ? Title Patient will increase Functional Gait Assessment score to >22/30 as to reduce fall risk and improve dynamic gait safety with community ambulation.   ? Baseline 02/18/2021= 17/30, 11/15: 14/30, 12/5: 13/30; 12/14= 18/30; 06/29/2021= 19/30; 08/05/2021= 20/30   ? Time 12   ? Period Weeks   ? Status On-going   ? Target Date 10/28/21   ?  ? PT LONG TERM GOAL #4  ? Title Pt will increase 10MWT by at least 0.13 m/s in order to demonstrate clinically significant improvement in community ambulation.   ? Baseline 02/18/2021= 0.89 m/s without AD, 11/15: 0.97 m/s, 12/5: 1.15ms, no AD; 06/29/2021= 1.15 m/s without an AD   ? Time 12   ? Period Weeks   ? Status Achieved   ? Target Date 08/04/21   ?  ? PT LONG TERM GOAL #5  ? Title Patient will report decreased left side buttock pain to less than 4/10 with transfers/mobility for improved functional mobility in home and  community.   ? Baseline 05/12/2021= 6/10 left gluteal pain.; 06/29/2021- Patient states his gluteal pain has  much improved and only rates as "sore" at times but not really painful.   ? Time 12   ? Period Weeks   ? Status Achieved   ?  ? Additional Long Term Goals  ? Additional Long Term Goals Yes   ?  ? PT LONG TERM GOAL #6  ? Title Patient will demonstrate improved static standing balance as seen by ability to single leg stand 5 sec or greater to assist with balance with functional mobility in the community.   ? Baseline Time on each foot - Left= 2 sec; right = 1 sec   ? Time 12   ? Period Weeks   ? Status New   ? Target Date 10/28/21   ? ?  ?  ? ?  ? ? ? ? ? ? ? ? Plan - 08/31/21 1621   ? ? Clinical Impression Statement Patient presented with good motivation for today's session. He started off with increased unsteadiness in // bars performing step ups and ladder drills but did progress throughout eventually with no UE support and no LOB. He also demo improved coordination with cone activities with out of LOB. Patient will benefit from further skilled PT to address these deficits in order to improve safety and independence with ambulation and functional mobility   ? Personal Factors and Comorbidities Age;Comorbidity 3+   ? Comorbidities remote TBI, frontal dementia, DM   ? Examination-Activity Limitations Carry;Other;Locomotion Level   ? Examination-Participation Restrictions Community Activity;[employer] Work   ? Stability/Clinical Decision Making Stable/Uncomplicated   ? Rehab Potential Good   ? PT Frequency 1x / week   ? PT Duration 12 weeks   ? PT Treatment/Interventions ADLs/Self Care Home Management;Cryotherapy;Moist Heat;DME Instruction;Gait training;Stair training;Functional mobility training;Therapeutic activities;Therapeutic exercise;Balance training;Neuromuscular re-education;Patient/family education;Manual techniques;Passive range of motion;Dry needling;Vestibular;Canalith Repostioning;Spinal  Manipulations;Ultrasound   ? PT Next Visit Plan Continue with progressive LE Strengthening and balance activities for improved mobility static and dynamic balance. Focus on SLS and weight shifting/dual task activities.

## 2021-08-31 NOTE — Telephone Encounter (Signed)
[facility]MT[facility] to schedule Yearly Lung CA CT Scan. 

## 2021-09-01 ENCOUNTER — Ambulatory Visit
Admission: RE | Admit: 2021-09-01 | Discharge: 2021-09-01 | Disposition: A | Discharge status: Home / Self Care | Payer: Medicare HMO | Source: Ambulatory Visit | Attending: Radiation Oncology | Admitting: Radiation Oncology

## 2021-09-01 DIAGNOSIS — Z510 Encounter for antineoplastic radiation therapy: Secondary | ICD-10-CM | POA: Diagnosis not present

## 2021-09-02 ENCOUNTER — Ambulatory Visit
Admission: RE | Admit: 2021-09-02 | Discharge: 2021-09-02 | Disposition: A | Discharge status: Home / Self Care | Payer: Medicare HMO | Source: Ambulatory Visit | Attending: Radiation Oncology | Admitting: Radiation Oncology

## 2021-09-02 ENCOUNTER — Ambulatory Visit: Payer: Medicare HMO

## 2021-09-02 DIAGNOSIS — Z510 Encounter for antineoplastic radiation therapy: Secondary | ICD-10-CM | POA: Diagnosis not present

## 2021-09-03 ENCOUNTER — Ambulatory Visit
Admission: RE | Admit: 2021-09-03 | Discharge: 2021-09-03 | Disposition: A | Discharge status: Home / Self Care | Payer: Medicare HMO | Source: Ambulatory Visit | Attending: Radiation Oncology | Admitting: Radiation Oncology

## 2021-09-03 DIAGNOSIS — Z510 Encounter for antineoplastic radiation therapy: Secondary | ICD-10-CM | POA: Diagnosis not present

## 2021-09-06 ENCOUNTER — Ambulatory Visit
Admission: RE | Admit: 2021-09-06 | Discharge: 2021-09-06 | Disposition: A | Discharge status: Home / Self Care | Payer: Medicare HMO | Source: Ambulatory Visit | Attending: Radiation Oncology | Admitting: Radiation Oncology

## 2021-09-06 DIAGNOSIS — Z510 Encounter for antineoplastic radiation therapy: Secondary | ICD-10-CM | POA: Diagnosis not present

## 2021-09-07 ENCOUNTER — Ambulatory Visit: Payer: Medicare HMO

## 2021-09-07 ENCOUNTER — Ambulatory Visit
Admission: RE | Admit: 2021-09-07 | Discharge: 2021-09-07 | Disposition: A | Discharge status: Home / Self Care | Payer: Medicare HMO | Source: Ambulatory Visit | Attending: Radiation Oncology | Admitting: Radiation Oncology

## 2021-09-07 ENCOUNTER — Encounter: Payer: Self-pay | Admitting: Gastroenterology

## 2021-09-07 DIAGNOSIS — Z510 Encounter for antineoplastic radiation therapy: Secondary | ICD-10-CM | POA: Diagnosis not present

## 2021-09-07 DIAGNOSIS — R269 Unspecified abnormalities of gait and mobility: Secondary | ICD-10-CM | POA: Diagnosis not present

## 2021-09-07 DIAGNOSIS — R262 Difficulty in walking, not elsewhere classified: Secondary | ICD-10-CM

## 2021-09-07 DIAGNOSIS — M6281 Muscle weakness (generalized): Secondary | ICD-10-CM

## 2021-09-07 DIAGNOSIS — R278 Other lack of coordination: Secondary | ICD-10-CM

## 2021-09-07 DIAGNOSIS — R2681 Unsteadiness on feet: Secondary | ICD-10-CM

## 2021-09-07 DIAGNOSIS — R2689 Other abnormalities of gait and mobility: Secondary | ICD-10-CM

## 2021-09-07 NOTE — Therapy (Signed)
[place] [facility] ?[place], [place], [address] ?Phone: [phone]   Fax:  [phone] ? ?Physical Therapy Treatment ? ?Patient Details  ?Name: Dustin Ferrell ?MRN: [id number] ?Date of Birth: May 06, 1946 ?Referring Provider (PT): Dr. Joselyn Arrow ? ? ?Encounter Date: 09/07/2021 ? ? PT End of Session - 09/07/21 1608   ? ? Visit Number 34   ? Number of Visits 37   ? Date for PT Re-Evaluation 10/28/21   ? Authorization Type [employer] Medicare   ? Authorization Time Period 05/12/21-08/04/21; 08/05/2021-10/28/2021   ? Progress Note Due on Visit 40   ? PT Start Time 1602   ? PT Stop Time 8003   ? PT Time Calculation (min) 44 min   ? Equipment Utilized During Treatment Gait belt   ? Activity Tolerance Patient tolerated treatment well   ? Behavior During Therapy [facility] for tasks assessed/performed   ? ?  ?  ? ?  ? ? ?Past Medical History:  ?Diagnosis Date  ? Anxiety   ? associated with frontal temporal behavioral dementia  ? Basal cell carcinoma 06/03/2021  ? Left nasal ala - needs treatment  ? Dementia with behavioral disturbance ([place])   ? Diabetes mellitus without complication ([place])   ? History of traumatic head injury   ? Hypertension   ? ? ?Past Surgical History:  ?Procedure Laterality Date  ? Eye Socket fracture repair Right   ? HERNIA REPAIR    ? ROTATOR CUFF REPAIR Left   ? TONSILLECTOMY    ? WRIST FRACTURE SURGERY Right   ? ? ?There were no vitals filed for this visit. ? ? Subjective Assessment - 09/07/21 1601   ? ? Subjective Patient reports he has mostly been taking it easy. States compliant with HEP and reports has not had any falls and not working much in yard.   ? Patient is accompained by: Family member   ? Pertinent History Dustin Ferrell is a 55yoM who is referred to OPPT for imbalance and falls- pt also followed by Dr. Alvan Dame orthopedics for acute on chronic Left hip pain. Pt is followed by Dr. Manuella Ghazi c neurology. 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   ? Limitations Standing;Walking;House hold activities   ? How long can you sit comfortably? no restriction   ? How long can you stand comfortably? 15 min   ? How long can you walk comfortably? 10 min   ? Patient Stated Goals I want to improve my balance and not fall   ? Currently in Pain? No/denies   ? ?  ?  ? ?  ? ? ? ? ?INTERVENTIONS:  ? ?Step tap in // bars without UE support - 25 reps alt LE- Patient initially very unsteady but improved with practice.  ? ?Staggered stand 1 foot on airex  and 1 on step block without UE support -hold 30 sec x each LE x 2 sets- 1st set- unsteady with UE reaching but did improve on 2nd set- mostly ankle strategy.  ? ?Step up from airex pad to 6in step and back to airex pad x 15 step Alt LE- without UE support and exhibited increase unsteadiness overall.  ? ?Ladder drill- forward/backward- in // bars - 1 foot per square forward but only able to step to  gait (both feet into square) going backward.  ? ?Ladder drill in //bars- Side step- left to right and back x 10 down and back without UE support- Good wide step without LOB.  ? ?1/2 foam- A/P rocking- Increased LOB with poor ability to correct once beyond LOS x 97mn ? ?A/P rocker board activities:  ?-Initially static stand on board with equal WB- Patient with difficulty- mostly tending to ant weight shift- did improve with practice.  ? ?Education provided throughout session via VC/TC and demonstration to facilitate movement at target joints and correct muscle activation for all testing and exercises performed.  ? ? ? ? ? ? ? ? ? ? ? ? ? ? ? ? ? ? ? ? ? ? ? ? PT Education - 09/07/21 1602   ? ? Education Details Exercise technique   ? Person(s) Educated Patient   ? Methods Explanation;Demonstration;Tactile cues;Verbal cues   ?  Comprehension Verbalized understanding;Returned demonstration;Verbal cues required;Tactile cues required;Need further instruction   ? ?  ?  ? ?  ? ? ? PT Short Term Goals - 05/12/21 1040   ? ?  ? PT SHORT TERM GOAL #1  ? Title Pt will be independent with HEP in order to improve strength and balance in order to decrease fall risk and improve function at home and work.   ? Baseline 11/15: not doing HEP right now; 12/5:  doing HEP with help of spouse every day, but without spouse has difficulty with compliance. 05/13/2021- Due to some mild cognitive issues- this goal is not attainable as he requires some supervision with home program.   ? Time 6   ? Period Weeks   ? Status Unable to assess   ? Target Date 04/01/21   ? ?  ?  ? ?  ? ? ? ? PT Long Term Goals - 08/05/21 1606   ? ?  ? PT LONG TERM GOAL #1  ? Title Pt will improve FOTO to target score of 60 display perceived improvements in ability to complete ADL's.   ? Baseline 02/18/2021= 55%, 12/5: 46%; 06/29/2021= 57%; 08/05/2021= 57%   ? Time 12   ? Period Weeks   ? Status On-going   ? Target Date 10/28/21   ?  ? PT LONG TERM GOAL #2  ? Title Pt will decrease 5TSTS by at least 3 seconds in order to demonstrate clinically significant improvement in LE strength.   ? Baseline 02/18/2021= 15 sec, 11/15: 17.3 sec, 12/5: 11.19s with BUE support, 14.55s no UE support; 05/12/2021= 11:35 sec   ? Time 12   ? Period Weeks   ? Status Achieved   ? Target Date 05/13/21   ?  ? PT LONG TERM GOAL #3  ? Title Patient will increase Functional Gait Assessment score to >22/30 as to reduce fall risk and improve dynamic gait safety with community ambulation.   ? Baseline 02/18/2021= 17/30, 11/15: 14/30, 12/5: 13/30; 12/14= 18/30; 06/29/2021= 19/30; 08/05/2021= 20/30   ? Time 12   ? Period Weeks   ? Status On-going   ? Target Date 10/28/21   ?  ? PT LONG TERM GOAL #4  ? Title Pt will increase 10MWT by at least 0.13 m/s in order to demonstrate clinically significant improvement in community ambulation.    ? Baseline 02/18/2021= 0.89 m/s without AD, 11/15: 0.97 m/s, 12/5: 1.017m, no AD; 06/29/2021= 1.15 m/s without an AD   ? Time 12   ? Period Weeks   ? Status  Achieved   ? Target Date 08/04/21   ?  ? PT LONG TERM GOAL #5  ? Title Patient will report decreased left side buttock pain to less than 4/10 with transfers/mobility for improved functional mobility in home and community.   ? Baseline 05/12/2021= 6/10 left gluteal pain.; 06/29/2021- Patient states his gluteal pain has much improved and only rates as "sore" at times but not really painful.   ? Time 12   ? Period Weeks   ? Status Achieved   ?  ? Additional Long Term Goals  ? Additional Long Term Goals Yes   ?  ? PT LONG TERM GOAL #6  ? Title Patient will demonstrate improved static standing balance as seen by ability to single leg stand 5 sec or greater to assist with balance with functional mobility in the community.   ? Baseline Time on each foot - Left= 2 sec; right = 1 sec   ? Time 12   ? Period Weeks   ? Status New   ? Target Date 10/28/21   ? ?  ?  ? ?  ? ? ? ? ? ? ? ? Plan - 09/07/21 1608   ? ? Clinical Impression Statement Patient presented with good motivation for today's session. He demonstrated initially poor balance with increase unsteadiness/shaking but did improve during session improving with all activities and demonstrating ability to adapt well overall. He was fatigued at end of session and presented with increased preference to ant weight shift vs. Shifting back on heels. Patient will benefit from further skilled PT to address these deficits in order to improve safety and independence with ambulation and functional mobility   ? Personal Factors and Comorbidities Age;Comorbidity 3+   ? Comorbidities remote TBI, frontal dementia, DM   ? Examination-Activity Limitations Carry;Other;Locomotion Level   ? Examination-Participation Restrictions Community Activity;[employer] Work   ? Stability/Clinical Decision Making Stable/Uncomplicated   ? Rehab Potential  Good   ? PT Frequency 1x / week   ? PT Duration 12 weeks   ? PT Treatment/Interventions ADLs/Self Care Home Management;Cryotherapy;Moist Heat;DME Instruction;Gait training;Stair training;Functional mobi

## 2021-09-08 ENCOUNTER — Ambulatory Visit
Admission: RE | Admit: 2021-09-08 | Discharge: 2021-09-08 | Disposition: A | Discharge status: Home / Self Care | Payer: Medicare HMO | Source: Ambulatory Visit | Attending: Radiation Oncology | Admitting: Radiation Oncology

## 2021-09-08 DIAGNOSIS — Z510 Encounter for antineoplastic radiation therapy: Secondary | ICD-10-CM | POA: Diagnosis not present

## 2021-09-09 ENCOUNTER — Ambulatory Visit: Payer: Medicare HMO

## 2021-09-09 ENCOUNTER — Ambulatory Visit
Admission: RE | Admit: 2021-09-09 | Discharge: 2021-09-09 | Disposition: A | Discharge status: Home / Self Care | Payer: Medicare HMO | Source: Ambulatory Visit | Attending: Radiation Oncology | Admitting: Radiation Oncology

## 2021-09-09 DIAGNOSIS — Z510 Encounter for antineoplastic radiation therapy: Secondary | ICD-10-CM | POA: Diagnosis not present

## 2021-09-10 ENCOUNTER — Ambulatory Visit
Admission: RE | Admit: 2021-09-10 | Discharge: 2021-09-10 | Disposition: A | Discharge status: Home / Self Care | Payer: Medicare HMO | Source: Ambulatory Visit | Attending: Radiation Oncology | Admitting: Radiation Oncology

## 2021-09-10 DIAGNOSIS — Z510 Encounter for antineoplastic radiation therapy: Secondary | ICD-10-CM | POA: Diagnosis not present

## 2021-09-13 ENCOUNTER — Ambulatory Visit
Admission: RE | Admit: 2021-09-13 | Discharge: 2021-09-13 | Disposition: A | Discharge status: Home / Self Care | Payer: Medicare HMO | Source: Ambulatory Visit | Attending: Radiation Oncology | Admitting: Radiation Oncology

## 2021-09-13 DIAGNOSIS — Z510 Encounter for antineoplastic radiation therapy: Secondary | ICD-10-CM | POA: Diagnosis not present

## 2021-09-14 ENCOUNTER — Other Ambulatory Visit: Payer: Self-pay

## 2021-09-14 ENCOUNTER — Ambulatory Visit
Admission: RE | Admit: 2021-09-14 | Discharge: 2021-09-14 | Disposition: A | Discharge status: Home / Self Care | Payer: Medicare HMO | Source: Ambulatory Visit | Attending: Radiation Oncology | Admitting: Radiation Oncology

## 2021-09-14 ENCOUNTER — Ambulatory Visit: Payer: Medicare HMO

## 2021-09-14 DIAGNOSIS — R2681 Unsteadiness on feet: Secondary | ICD-10-CM

## 2021-09-14 DIAGNOSIS — R2689 Other abnormalities of gait and mobility: Secondary | ICD-10-CM

## 2021-09-14 DIAGNOSIS — Z510 Encounter for antineoplastic radiation therapy: Secondary | ICD-10-CM | POA: Diagnosis not present

## 2021-09-14 DIAGNOSIS — R269 Unspecified abnormalities of gait and mobility: Secondary | ICD-10-CM | POA: Diagnosis not present

## 2021-09-14 DIAGNOSIS — R278 Other lack of coordination: Secondary | ICD-10-CM

## 2021-09-14 DIAGNOSIS — M6281 Muscle weakness (generalized): Secondary | ICD-10-CM

## 2021-09-14 DIAGNOSIS — R262 Difficulty in walking, not elsewhere classified: Secondary | ICD-10-CM

## 2021-09-14 LAB — RAD ONC ARIA SESSION SUMMARY
Course Elapsed Days: 19
Plan Fractions Treated to Date: 14
Plan Prescribed Dose Per Fraction: 2 Gy
Plan Total Fractions Prescribed: 25
Plan Total Prescribed Dose: 50 Gy
Reference Point Dosage Given to Date: 28 Gy
Reference Point Session Dosage Given: 2 Gy
Session Number: 14

## 2021-09-14 NOTE — Therapy (Signed)
[place] [facility] ?[place], [place], [address] ?Phone: [phone]   Fax:  [phone] ? ?Physical Therapy Treatment ? ?Patient Details  ?Name: Dustin Ferrell ?MRN: [id number] ?Date of Birth: September 23, 1945 ?Referring Provider (PT): Dr. Joselyn Arrow ? ? ?Encounter Date: 09/14/2021 ? ? PT End of Session - 09/14/21 1558   ? ? Visit Number 35   ? Number of Visits 37   ? Date for PT Re-Evaluation 10/28/21   ? Authorization Type [employer]na Medicare   ? Authorization Time Period 05/12/21-08/04/21; 08/05/2021-10/28/2021   ? Progress Note Due on Visit 40   ? PT Start Time 1545   ? PT Stop Time 1630   ? PT Time Calculation (min) 45 min   ? Equipment Utilized During Treatment Gait belt   ? Activity Tolerance Patient tolerated treatment well   ? Behavior During Therapy [facility] for tasks assessed/performed   ? ?  ?  ? ?  ? ? ?Past Medical History:  ?Diagnosis Date  ? Anxiety   ? associated with frontal temporal behavioral dementia  ? Basal cell carcinoma 06/03/2021  ? Left nasal ala - needs treatment  ? Dementia with behavioral disturbance ([place])   ? Diabetes mellitus without complication ([place])   ? History of traumatic head injury   ? Hypertension   ? ? ?Past Surgical History:  ?Procedure Laterality Date  ? Eye Socket fracture repair Right   ? HERNIA REPAIR    ? ROTATOR CUFF REPAIR Left   ? TONSILLECTOMY    ? WRIST FRACTURE SURGERY Right   ? ? ?There were no vitals filed for this visit. ? ? Subjective Assessment - 09/14/21 1554   ? ? Subjective Patient reports doing well today. States he is early as his wife had an appointment at [facility] as well. Denies any falls.   ? Patient is accompained by: Family member   ? Pertinent History Dustin Ferrell is a 6yoM who is referred to OPPT for imbalance and falls- pt also followed by Dr. Alvan Dame orthopedics for acute on chronic Left hip pain. Pt is followed by Dr. Manuella Ghazi c neurology. 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   ? Limitations Standing;Walking;House hold activities   ? How long can you sit comfortably? no restriction   ? How long can you stand comfortably? 15 min   ? How long can you walk comfortably? 10 min   ? Patient Stated Goals I want to improve my balance and not fall   ? Currently in Pain? No/denies   ? ?  ?  ? ?  ? ? ?INTERVENTIONS:  ? ?Neuromuscular re-ed:  ? ?Step up onto 6" step while contralateral LE 3lb AW (high knee hip march) - Difficulty with coordination.  ? ?Staggered standing on balance airex pad x 30 sec x 2 sets each LE ? ?Static stand on airex pad with dynamic squat - reaching in front to tap a cone positioned on 4" step x 15 reps. ? ?Forward/retro walking with 3lb AW- down and back -length of // bars without UE support x 10 reps.  ? ?Side step in // bars - counting how many steps it take to get to end of bars (5) - down and back x 8 sets wearing 3lb  ? ?Calf and toe  raises - standing on 1/2 foam to challenge balance reactions and only minimal UE support x 15 reps x 2 sets  ? ?Cone activities- 4 corners - cues to go forward/diagonal/backward and sidestep upon command. ? ? ?Therex:  ? ?Sit to stand x 10 reps without UE support ? ?Seated knee ext 3lb x 15 reps  x 2 sets  ? ?Seated heel raises 2 sets of 15 rep with 3lb AW ? ? ? ? ? ? ? ? ? ? ? ? ? ? ? ? ? ? ? ? ? ? ? PT Education - 09/14/21 1557   ? ? Education Details Balance exercise technique   ? Person(s) Educated Patient   ? Methods Explanation;Demonstration;Tactile cues;Verbal cues   ? Comprehension Verbalized understanding;Tactile cues required;Returned demonstration;Verbal cues required;Need further instruction   ? ?  ?  ? ?  ? ? ? PT Short Term Goals - 05/12/21 1040   ? ?  ? PT SHORT TERM GOAL #1  ? Title Pt will be independent with HEP in order to  improve strength and balance in order to decrease fall risk and improve function at home and work.   ? Baseline 11/15: not doing HEP right now; 12/5:  doing HEP with help of spouse every day, but without spouse has difficulty with compliance. 05/13/2021- Due to some mild cognitive issues- this goal is not attainable as he requires some supervision with home program.   ? Time 6   ? Period Weeks   ? Status Unable to assess   ? Target Date 04/01/21   ? ?  ?  ? ?  ? ? ? ? PT Long Term Goals - 08/05/21 1606   ? ?  ? PT LONG TERM GOAL #1  ? Title Pt will improve FOTO to target score of 60 display perceived improvements in ability to complete ADL's.   ? Baseline 02/18/2021= 55%, 12/5: 46%; 06/29/2021= 57%; 08/05/2021= 57%   ? Time 12   ? Period Weeks   ? Status On-going   ? Target Date 10/28/21   ?  ? PT LONG TERM GOAL #2  ? Title Pt will decrease 5TSTS by at least 3 seconds in order to demonstrate clinically significant improvement in LE strength.   ? Baseline 02/18/2021= 15 sec, 11/15: 17.3 sec, 12/5: 11.19s with BUE support, 14.55s no UE support; 05/12/2021= 11:35 sec   ? Time 12   ? Period Weeks   ? Status Achieved   ? Target Date 05/13/21   ?  ? PT LONG TERM GOAL #3  ? Title Patient will increase Functional Gait Assessment score to >22/30 as to reduce fall risk and improve dynamic gait safety with community ambulation.   ? Baseline 02/18/2021= 17/30, 11/15: 14/30, 12/5: 13/30; 12/14= 18/30; 06/29/2021= 19/30; 08/05/2021= 20/30   ? Time 12   ? Period Weeks   ? Status On-going   ? Target Date 10/28/21   ?  ? PT LONG TERM GOAL #4  ? Title Pt will increase 10MWT by at least 0.13 m/s in order to demonstrate clinically significant improvement in community ambulation.   ? Baseline 02/18/2021= 0.89 m/s without AD, 11/15: 0.97 m/s, 12/5: 1.29ms, no AD; 06/29/2021= 1.15 m/s without an AD   ? Time 12   ? Period Weeks   ? Status Achieved   ? Target Date 08/04/21   ?  ? PT LONG TERM GOAL #5  ? Title Patient will report decreased left side  buttock pain to less  than 4/10 with transfers/mobility for improved functional mobility in home and community.   ? Baseline 05/12/2021= 6/10 left gluteal pain.; 06/29/2021- Patient states his gluteal pain has much improved and only rates as "sore" at times but not really painful.   ? Time 12   ? Period Weeks   ? Status Achieved   ?  ? Additional Long Term Goals  ? Additional Long Term Goals Yes   ?  ? PT LONG TERM GOAL #6  ? Title Patient will demonstrate improved static standing balance as seen by ability to single leg stand 5 sec or greater to assist with balance with functional mobility in the community.   ? Baseline Time on each foot - Left= 2 sec; right = 1 sec   ? Time 12   ? Period Weeks   ? Status New   ? Target Date 10/28/21   ? ?  ?  ? ?  ? ? ? ? ? ? ? ? Plan - 09/14/21 1558   ? ? Clinical Impression Statement Patient performed better today with increased steadiness vs. Last visit. He was able to complete cone activities without LOB today. He was able to add some resistance with balance activities and able to return demonstration well with exercises - improving as he went today. Patient will benefit from further skilled PT to address these deficits in order to improve safety and independence with ambulation and functional mobility   ? Personal Factors and Comorbidities Age;Comorbidity 3+   ? Comorbidities remote TBI, frontal dementia, DM   ? Examination-Activity Limitations Carry;Other;Locomotion Level   ? Examination-Participation Restrictions Community Activity;[employer] Work   ? Stability/Clinical Decision Making Stable/Uncomplicated   ? Rehab Potential Good   ? PT Frequency 1x / week   ? PT Duration 12 weeks   ? PT Treatment/Interventions ADLs/Self Care Home Management;Cryotherapy;Moist Heat;DME Instruction;Gait training;Stair training;Functional mobility training;Therapeutic activities;Therapeutic exercise;Balance training;Neuromuscular re-education;Patient/family education;Manual techniques;Passive range  of motion;Dry needling;Vestibular;Canalith Repostioning;Spinal Manipulations;Ultrasound   ? PT Next Visit Plan Continue with progressive LE Strengthening and balance activities for improved mobility static and

## 2021-09-15 ENCOUNTER — Ambulatory Visit
Admission: RE | Admit: 2021-09-15 | Discharge: 2021-09-15 | Disposition: A | Discharge status: Home / Self Care | Payer: Medicare HMO | Source: Ambulatory Visit | Attending: Radiation Oncology | Admitting: Radiation Oncology

## 2021-09-15 ENCOUNTER — Other Ambulatory Visit: Payer: Self-pay

## 2021-09-15 DIAGNOSIS — Z510 Encounter for antineoplastic radiation therapy: Secondary | ICD-10-CM | POA: Diagnosis not present

## 2021-09-15 LAB — RAD ONC ARIA SESSION SUMMARY
Course Elapsed Days: 20
Plan Fractions Treated to Date: 15
Plan Prescribed Dose Per Fraction: 2 Gy
Plan Total Fractions Prescribed: 25
Plan Total Prescribed Dose: 50 Gy
Reference Point Dosage Given to Date: 30 Gy
Reference Point Session Dosage Given: 2 Gy
Session Number: 15

## 2021-09-16 ENCOUNTER — Ambulatory Visit
Admission: RE | Admit: 2021-09-16 | Discharge: 2021-09-16 | Disposition: A | Discharge status: Home / Self Care | Payer: Medicare HMO | Source: Ambulatory Visit | Attending: Radiation Oncology | Admitting: Radiation Oncology

## 2021-09-16 ENCOUNTER — Ambulatory Visit: Payer: Medicare HMO

## 2021-09-16 ENCOUNTER — Other Ambulatory Visit: Payer: Self-pay

## 2021-09-16 DIAGNOSIS — Z510 Encounter for antineoplastic radiation therapy: Secondary | ICD-10-CM | POA: Diagnosis not present

## 2021-09-16 LAB — RAD ONC ARIA SESSION SUMMARY
Course Elapsed Days: 21
Plan Fractions Treated to Date: 16
Plan Prescribed Dose Per Fraction: 2 Gy
Plan Total Fractions Prescribed: 25
Plan Total Prescribed Dose: 50 Gy
Reference Point Dosage Given to Date: 32 Gy
Reference Point Session Dosage Given: 2 Gy
Session Number: 16

## 2021-09-17 ENCOUNTER — Other Ambulatory Visit: Payer: Self-pay

## 2021-09-17 ENCOUNTER — Ambulatory Visit
Admission: RE | Admit: 2021-09-17 | Discharge: 2021-09-17 | Disposition: A | Discharge status: Home / Self Care | Payer: Medicare HMO | Source: Ambulatory Visit | Attending: Radiation Oncology | Admitting: Radiation Oncology

## 2021-09-17 DIAGNOSIS — Z510 Encounter for antineoplastic radiation therapy: Secondary | ICD-10-CM | POA: Diagnosis not present

## 2021-09-17 LAB — RAD ONC ARIA SESSION SUMMARY
Course Elapsed Days: 22
Plan Fractions Treated to Date: 17
Plan Prescribed Dose Per Fraction: 2 Gy
Plan Total Fractions Prescribed: 25
Plan Total Prescribed Dose: 50 Gy
Reference Point Dosage Given to Date: 34 Gy
Reference Point Session Dosage Given: 2 Gy
Session Number: 17

## 2021-09-20 ENCOUNTER — Other Ambulatory Visit: Payer: Self-pay

## 2021-09-20 ENCOUNTER — Ambulatory Visit
Admission: RE | Admit: 2021-09-20 | Discharge: 2021-09-20 | Disposition: A | Discharge status: Home / Self Care | Payer: Medicare HMO | Source: Ambulatory Visit | Attending: Radiation Oncology | Admitting: Radiation Oncology

## 2021-09-20 DIAGNOSIS — Z510 Encounter for antineoplastic radiation therapy: Secondary | ICD-10-CM | POA: Diagnosis not present

## 2021-09-20 LAB — RAD ONC ARIA SESSION SUMMARY
Course Elapsed Days: 25
Plan Fractions Treated to Date: 18
Plan Prescribed Dose Per Fraction: 2 Gy
Plan Total Fractions Prescribed: 25
Plan Total Prescribed Dose: 50 Gy
Reference Point Dosage Given to Date: 36 Gy
Reference Point Session Dosage Given: 2 Gy
Session Number: 18

## 2021-09-21 ENCOUNTER — Ambulatory Visit: Payer: Medicare HMO

## 2021-09-21 ENCOUNTER — Other Ambulatory Visit: Payer: Self-pay

## 2021-09-21 ENCOUNTER — Ambulatory Visit
Admission: RE | Admit: 2021-09-21 | Discharge: 2021-09-21 | Disposition: A | Discharge status: Home / Self Care | Payer: Medicare HMO | Source: Ambulatory Visit | Attending: Radiation Oncology | Admitting: Radiation Oncology

## 2021-09-21 DIAGNOSIS — R269 Unspecified abnormalities of gait and mobility: Secondary | ICD-10-CM | POA: Diagnosis not present

## 2021-09-21 DIAGNOSIS — R262 Difficulty in walking, not elsewhere classified: Secondary | ICD-10-CM

## 2021-09-21 DIAGNOSIS — R2689 Other abnormalities of gait and mobility: Secondary | ICD-10-CM

## 2021-09-21 DIAGNOSIS — M6281 Muscle weakness (generalized): Secondary | ICD-10-CM

## 2021-09-21 DIAGNOSIS — R2681 Unsteadiness on feet: Secondary | ICD-10-CM

## 2021-09-21 DIAGNOSIS — Z510 Encounter for antineoplastic radiation therapy: Secondary | ICD-10-CM | POA: Diagnosis not present

## 2021-09-21 DIAGNOSIS — R278 Other lack of coordination: Secondary | ICD-10-CM

## 2021-09-21 LAB — RAD ONC ARIA SESSION SUMMARY
Course Elapsed Days: 26
Plan Fractions Treated to Date: 19
Plan Prescribed Dose Per Fraction: 2 Gy
Plan Total Fractions Prescribed: 25
Plan Total Prescribed Dose: 50 Gy
Reference Point Dosage Given to Date: 38 Gy
Reference Point Session Dosage Given: 2 Gy
Session Number: 19

## 2021-09-21 NOTE — Therapy (Signed)
[place] [facility] ?[place], [place], [address] ?Phone: [phone]   Fax:  [phone] ? ?Physical Therapy Treatment ? ?Patient Details  ?Name: Dustin Ferrell ?MRN: [id number] ?Date of Birth: [birth year] ?Referring Provider (PT): Dr. Joselyn Arrow ? ? ?Encounter Date: 09/21/2021 ? ? PT End of Session - 09/21/21 1537   ? ? Visit Number 36   ? Number of Visits 42   ? Date for PT Re-Evaluation 10/28/21   ? Authorization Type [employer]na Medicare   ? Authorization Time Period 05/12/21-08/04/21; 08/05/2021-10/28/2021   ? Progress Note Due on Visit 40   ? PT Start Time 1533   ? PT Stop Time 3704   ? PT Time Calculation (min) 41 min   ? Equipment Utilized During Treatment Gait belt   ? Activity Tolerance Patient tolerated treatment well   ? Behavior During Therapy [facility] for tasks assessed/performed   ? ?  ?  ? ?  ? ? ?Past Medical History:  ?Diagnosis Date  ? Anxiety   ? associated with frontal temporal behavioral dementia  ? Basal cell carcinoma 06/03/2021  ? Left nasal ala - needs treatment  ? Dementia with behavioral disturbance ([place])   ? Diabetes mellitus without complication ([place])   ? History of traumatic head injury   ? Hypertension   ? ? ?Past Surgical History:  ?Procedure Laterality Date  ? Eye Socket fracture repair Right   ? HERNIA REPAIR    ? ROTATOR CUFF REPAIR Left   ? TONSILLECTOMY    ? WRIST FRACTURE SURGERY Right   ? ? ?There were no vitals filed for this visit. ? ? Subjective Assessment - 09/22/21 1640   ? ? Subjective Patient reports doing well overall and states has been walking and working some in yard.   ? Patient is accompained by: Family member   ? Pertinent History Dustin Ferrell is a 16yoM who is referred to OPPT for imbalance and falls- pt also followed by Dr. Alvan Dame orthopedics for acute on chronic Left hip pain. Pt is followed by Dr. Manuella Ghazi c neurology. 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   ? Limitations Standing;Walking;House hold activities   ? How long can you sit comfortably? no restriction   ? How long can you stand comfortably? 15 min   ? How long can you walk comfortably? 10 min   ? Patient Stated Goals I want to improve my balance and not fall   ? Currently in Pain? No/denies   ? Pain Type Chronic pain   ? ?  ?  ? ?  ?INTERVENTIONS:  ? ?Neuromuscular re-ed: ? ?Dynamic marching on airex pad x 25 without UE Support.  ? ?Side step up onto airex pad and back down (going left to right then back x 12 reps)  ? ?Ant to post weight shift on airex pad with CGA - Max difficulty with post wt. Shift- losing balance requiring UE support several times ? ?Ant to post weight shift on rocker board x 25 - Increased difficulty with post. Wt shift ? ?Resistive gait Forward 12.5 lb x 5 ?Resistive gait Backward 12.5 lb x 5  ? ? ?Cone activity- Ambulation forward- figure 8 around 4 cones- x 3 trips.  ?Cone activity- Side stepping with some forward/backward steps around 4  cones x 4 trips.  ? ?Sit to stand with one foot on green therapad x 10 then switch feet for 10 more. Patient exhibited fatigue yet able to complete today.  ? ?Education provided throughout session via VC/TC and demonstration to facilitate movement at target joints and correct muscle activation for all testing and exercises performed.  ? ? ? ? ? ? ? ? ? ? ? ? ? ? ? ? ? ? ? ? ? ? ? PT Education - 09/22/21 1956   ? ? Education Details balance exercises   ? Person(s) Educated Patient   ? Methods Explanation;Demonstration;Tactile cues;Verbal cues   ? Comprehension Verbalized understanding;Returned demonstration;Verbal cues required;Tactile cues required;Need further instruction   ? ?  ?  ? ?  ? ? ? PT Short Term Goals - 05/12/21 1040   ? ?  ? PT SHORT TERM GOAL #1  ? Title Pt will be independent  with HEP in order to improve strength and balance in order to decrease fall risk and improve function at home and work.   ? Baseline 11/15: not doing HEP right now; 12/5:  doing HEP with help of spouse every day, but without spouse has difficulty with compliance. 05/13/2021- Due to some mild cognitive issues- this goal is not attainable as he requires some supervision with home program.   ? Time 6   ? Period Weeks   ? Status Unable to assess   ? Target Date 04/01/21   ? ?  ?  ? ?  ? ? ? ? PT Long Term Goals - 08/05/21 1606   ? ?  ? PT LONG TERM GOAL #1  ? Title Pt will improve FOTO to target score of 60 display perceived improvements in ability to complete ADL's.   ? Baseline 02/18/2021= 55%, 12/5: 46%; 06/29/2021= 57%; 08/05/2021= 57%   ? Time 12   ? Period Weeks   ? Status On-going   ? Target Date 10/28/21   ?  ? PT LONG TERM GOAL #2  ? Title Pt will decrease 5TSTS by at least 3 seconds in order to demonstrate clinically significant improvement in LE strength.   ? Baseline 02/18/2021= 15 sec, 11/15: 17.3 sec, 12/5: 11.19s with BUE support, 14.55s no UE support; 05/12/2021= 11:35 sec   ? Time 12   ? Period Weeks   ? Status Achieved   ? Target Date 05/13/21   ?  ? PT LONG TERM GOAL #3  ? Title Patient will increase Functional Gait Assessment score to >22/30 as to reduce fall risk and improve dynamic gait safety with community ambulation.   ? Baseline 02/18/2021= 17/30, 11/15: 14/30, 12/5: 13/30; 12/14= 18/30; 06/29/2021= 19/30; 08/05/2021= 20/30   ? Time 12   ? Period Weeks   ? Status On-going   ? Target Date 10/28/21   ?  ? PT LONG TERM GOAL #4  ? Title Pt will increase 10MWT by at least 0.13 m/s in order to demonstrate clinically significant improvement in community ambulation.   ? Baseline 02/18/2021= 0.89 m/s without AD, 11/15: 0.97 m/s, 12/5: 1.73ms, no AD; 06/29/2021= 1.15 m/s without an AD   ? Time 12   ? Period Weeks   ? Status Achieved   ? Target Date 08/04/21   ?  ? PT LONG TERM GOAL #5  ? Title Patient will report  decreased left side buttock pain to less than 4/10 with transfers/mobility for improved functional mobility in home and community.   ? Baseline 05/12/2021= 6/10  left gluteal pain.; 06/29/2021- Patient states his gluteal pain has much improved and only rates as "sore" at times but not really painful.   ? Time 12   ? Period Weeks   ? Status Achieved   ?  ? Additional Long Term Goals  ? Additional Long Term Goals Yes   ?  ? PT LONG TERM GOAL #6  ? Title Patient will demonstrate improved static standing balance as seen by ability to single leg stand 5 sec or greater to assist with balance with functional mobility in the community.   ? Baseline Time on each foot - Left= 2 sec; right = 1 sec   ? Time 12   ? Period Weeks   ? Status New   ? Target Date 10/28/21   ? ?  ?  ? ?  ? ? ? ? ? ? ? ? Plan - 09/21/21 1953   ? ? Clinical Impression Statement Patient presented with good motivation today. He continued to exhibit less unsteadiness with balance activities however did struggle today some with posterior weight shift and unable to self correct anteriorly. He performed better overall with cone activities including less unsteadiness with retro, side stepping. Patient will benefit from further skilled PT to address these deficits in order to improve safety and independence with ambulation and functional mobility   ? Personal Factors and Comorbidities Age;Comorbidity 3+   ? Comorbidities remote TBI, frontal dementia, DM   ? Examination-Activity Limitations Carry;Other;Locomotion Level   ? Examination-Participation Restrictions Community Activity;Valla Leaver Work   ? Stability/Clinical Decision Making Stable/Uncomplicated   ? Rehab Potential Good   ? PT Frequency 1x / week   ? PT Duration 12 weeks   ? PT Treatment/Interventions ADLs/Self Care Home Management;Cryotherapy;Moist Heat;DME Instruction;Gait training;Stair training;Functional mobility training;Therapeutic activities;Therapeutic exercise;Balance training;Neuromuscular  re-education;Patient/family education;Manual techniques;Passive range of motion;Dry needling;Vestibular;Canalith Repostioning;Spinal Manipulations;Ultrasound   ? PT Next Visit Plan Continue with progressive LE St

## 2021-09-22 ENCOUNTER — Other Ambulatory Visit: Payer: Self-pay

## 2021-09-22 ENCOUNTER — Ambulatory Visit
Admission: RE | Admit: 2021-09-22 | Discharge: 2021-09-22 | Disposition: A | Discharge status: Home / Self Care | Payer: Medicare HMO | Source: Ambulatory Visit | Attending: Radiation Oncology | Admitting: Radiation Oncology

## 2021-09-22 DIAGNOSIS — Z510 Encounter for antineoplastic radiation therapy: Secondary | ICD-10-CM | POA: Diagnosis not present

## 2021-09-22 LAB — RAD ONC ARIA SESSION SUMMARY
Course Elapsed Days: 27
Plan Fractions Treated to Date: 20
Plan Prescribed Dose Per Fraction: 2 Gy
Plan Total Fractions Prescribed: 25
Plan Total Prescribed Dose: 50 Gy
Reference Point Dosage Given to Date: 40 Gy
Reference Point Session Dosage Given: 2 Gy
Session Number: 20

## 2021-09-23 ENCOUNTER — Ambulatory Visit: Payer: Medicare HMO

## 2021-09-23 ENCOUNTER — Other Ambulatory Visit: Payer: Self-pay

## 2021-09-23 ENCOUNTER — Ambulatory Visit
Admission: RE | Admit: 2021-09-23 | Discharge: 2021-09-23 | Disposition: A | Discharge status: Home / Self Care | Payer: Medicare HMO | Source: Ambulatory Visit | Attending: Radiation Oncology | Admitting: Radiation Oncology

## 2021-09-23 DIAGNOSIS — Z510 Encounter for antineoplastic radiation therapy: Secondary | ICD-10-CM | POA: Diagnosis not present

## 2021-09-23 LAB — RAD ONC ARIA SESSION SUMMARY
Course Elapsed Days: 28
Plan Fractions Treated to Date: 21
Plan Prescribed Dose Per Fraction: 2 Gy
Plan Total Fractions Prescribed: 25
Plan Total Prescribed Dose: 50 Gy
Reference Point Dosage Given to Date: 42 Gy
Reference Point Session Dosage Given: 2 Gy
Session Number: 21

## 2021-09-24 ENCOUNTER — Other Ambulatory Visit: Payer: Self-pay

## 2021-09-24 ENCOUNTER — Ambulatory Visit
Admission: RE | Admit: 2021-09-24 | Discharge: 2021-09-24 | Disposition: A | Discharge status: Home / Self Care | Payer: Medicare HMO | Source: Ambulatory Visit | Attending: Radiation Oncology | Admitting: Radiation Oncology

## 2021-09-24 DIAGNOSIS — Z510 Encounter for antineoplastic radiation therapy: Secondary | ICD-10-CM | POA: Diagnosis not present

## 2021-09-24 LAB — RAD ONC ARIA SESSION SUMMARY
Course Elapsed Days: 29
Plan Fractions Treated to Date: 22
Plan Prescribed Dose Per Fraction: 2 Gy
Plan Total Fractions Prescribed: 25
Plan Total Prescribed Dose: 50 Gy
Reference Point Dosage Given to Date: 44 Gy
Reference Point Session Dosage Given: 2 Gy
Session Number: 22

## 2021-09-27 ENCOUNTER — Other Ambulatory Visit: Payer: Self-pay

## 2021-09-27 ENCOUNTER — Ambulatory Visit
Admission: RE | Admit: 2021-09-27 | Discharge: 2021-09-27 | Disposition: A | Discharge status: Home / Self Care | Payer: Medicare HMO | Source: Ambulatory Visit | Attending: Radiation Oncology | Admitting: Radiation Oncology

## 2021-09-27 DIAGNOSIS — Z510 Encounter for antineoplastic radiation therapy: Secondary | ICD-10-CM | POA: Insufficient documentation

## 2021-09-27 DIAGNOSIS — C44311 Basal cell carcinoma of skin of nose: Secondary | ICD-10-CM | POA: Diagnosis present

## 2021-09-27 LAB — RAD ONC ARIA SESSION SUMMARY
Course Elapsed Days: 32
Plan Fractions Treated to Date: 23
Plan Prescribed Dose Per Fraction: 2 Gy
Plan Total Fractions Prescribed: 25
Plan Total Prescribed Dose: 50 Gy
Reference Point Dosage Given to Date: 46 Gy
Reference Point Session Dosage Given: 2 Gy
Session Number: 23

## 2021-09-28 ENCOUNTER — Ambulatory Visit
Admission: RE | Admit: 2021-09-28 | Discharge: 2021-09-28 | Disposition: A | Discharge status: Home / Self Care | Payer: Medicare HMO | Source: Ambulatory Visit | Attending: Radiation Oncology | Admitting: Radiation Oncology

## 2021-09-28 ENCOUNTER — Other Ambulatory Visit: Payer: Self-pay

## 2021-09-28 ENCOUNTER — Ambulatory Visit: Payer: Medicare HMO | Attending: Internal Medicine

## 2021-09-28 DIAGNOSIS — M6281 Muscle weakness (generalized): Secondary | ICD-10-CM | POA: Insufficient documentation

## 2021-09-28 DIAGNOSIS — R2689 Other abnormalities of gait and mobility: Secondary | ICD-10-CM | POA: Insufficient documentation

## 2021-09-28 DIAGNOSIS — R262 Difficulty in walking, not elsewhere classified: Secondary | ICD-10-CM | POA: Diagnosis present

## 2021-09-28 DIAGNOSIS — R2681 Unsteadiness on feet: Secondary | ICD-10-CM | POA: Diagnosis present

## 2021-09-28 DIAGNOSIS — R278 Other lack of coordination: Secondary | ICD-10-CM | POA: Diagnosis present

## 2021-09-28 DIAGNOSIS — R269 Unspecified abnormalities of gait and mobility: Secondary | ICD-10-CM | POA: Diagnosis present

## 2021-09-28 DIAGNOSIS — Z510 Encounter for antineoplastic radiation therapy: Secondary | ICD-10-CM | POA: Diagnosis not present

## 2021-09-28 LAB — RAD ONC ARIA SESSION SUMMARY
Course Elapsed Days: 33
Plan Fractions Treated to Date: 24
Plan Prescribed Dose Per Fraction: 2 Gy
Plan Total Fractions Prescribed: 25
Plan Total Prescribed Dose: 50 Gy
Reference Point Dosage Given to Date: 48 Gy
Reference Point Session Dosage Given: 2 Gy
Session Number: 24

## 2021-09-28 NOTE — Therapy (Signed)
[place] [facility] ?[place], [place], [address] ?Phone: [phone]   Fax:  [phone] ? ?Physical Therapy Treatment ? ?Patient Details  ?Name: Dustin Ferrell ?MRN: [id number] ?Date of Birth: Oct 09, 1945 ?Referring Provider (PT): Dr. Joselyn Arrow ? ? ?Encounter Date: 09/28/2021 ? ? PT End of Session - 09/28/21 1023   ? ? Visit Number 22   ? Number of Visits 42   ? Date for PT Re-Evaluation 10/28/21   ? Authorization Type [employer]na Medicare   ? Authorization Time Period 05/12/21-08/04/21; 08/05/2021-10/28/2021   ? Progress Note Due on Visit 40   ? PT Start Time 1016   ? PT Stop Time 1055   ? PT Time Calculation (min) 39 min   ? Equipment Utilized During Treatment Gait belt   ? Activity Tolerance Patient tolerated treatment well   ? Behavior During Therapy [facility] for tasks assessed/performed   ? ?  ?  ? ?  ? ? ?Past Medical History:  ?Diagnosis Date  ? Anxiety   ? associated with frontal temporal behavioral dementia  ? Basal cell carcinoma 06/03/2021  ? Left nasal ala - needs treatment  ? Dementia with behavioral disturbance ([place])   ? Diabetes mellitus without complication ([place])   ? History of traumatic head injury   ? Hypertension   ? ? ?Past Surgical History:  ?Procedure Laterality Date  ? Eye Socket fracture repair Right   ? HERNIA REPAIR    ? ROTATOR CUFF REPAIR Left   ? TONSILLECTOMY    ? WRIST FRACTURE SURGERY Right   ? ? ?There were no vitals filed for this visit. ? ? Subjective Assessment - 09/28/21 1022   ? ? Subjective Patient reports a little shaky today but no falls and no pain   ? Patient is accompained by: Family member   ? Pertinent History Dustin Ferrell is a 43yoM who is referred to OPPT for imbalance and falls- pt also followed by Dr. Alvan Dame orthopedics for acute on chronic Left hip pain. Pt is followed by Dr. Manuella Ghazi c neurology. 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   ? Limitations Standing;Walking;House hold activities   ? How long can you sit comfortably? no restriction   ? How long can you stand comfortably? 15 min   ? How long can you walk comfortably? 10 min   ? Patient Stated Goals I want to improve my balance and not fall   ? Currently in Pain? No/denies   ? ?  ?  ? ?  ? ? ?INTERVENTIONS:  ? ?Neuromuscular re-ed:  ? ?Tandem standing on 1/2 foam x 30 x sec x 5 each LE ? ?Staggered standing on airex pad - hold 30 sec  ? ?Staggered standing on airex pad with horizontal then vertical head motions x 10 each- More difficulty with right LE in back position today.  ? ? ?Sit to stand without UE with airex pad under one foot x 6 followed by 6 more with opp LE.  ? ?Cone activities:  ?-Amb forward/backward around 6 cones x several trials ?- Diagonal walking forward/backward/side stepping around 6 cones x multiple trials.  ?- Side stepping over cones - left to right then back x 2 each side. ?- Pick up cones (to simulate picking up weeds at home) -  using more squat technique than bending x 6 cones.  ? ?Ambulation on level surfaces  throughout hospital .500 feet and 2 flight of stair in hospital- Patient able to scan the hall- looking left to right calling out location and dept in Hospital and able to negotiate steps well using 1 rail.  ? ? ? ? ? ? ? ? ? ? ? ? ? ? ? ? ? ? ? ? ? ? ? ? ? ? ? ? PT Education - 09/28/21 1022   ? ? Education Details Exercise technique   ? Person(s) Educated Patient   ? Methods Explanation;Demonstration;Tactile cues;Verbal cues   ? Comprehension Verbalized understanding;Returned demonstration;Verbal cues required;Tactile cues required;Need further instruction   ? ?  ?  ? ?  ? ? ? PT Short Term Goals - 05/12/21 1040   ? ?  ? PT SHORT TERM GOAL #1  ? Title Pt will be independent with HEP in order to improve strength and balance in  order to decrease fall risk and improve function at home and work.   ? Baseline 11/15: not doing HEP right now; 12/5:  doing HEP with help of spouse every day, but without spouse has difficulty with compliance. 05/13/2021- Due to some mild cognitive issues- this goal is not attainable as he requires some supervision with home program.   ? Time 6   ? Period Weeks   ? Status Unable to assess   ? Target Date 04/01/21   ? ?  ?  ? ?  ? ? ? ? PT Long Term Goals - 08/05/21 1606   ? ?  ? PT LONG TERM GOAL #1  ? Title Pt will improve FOTO to target score of 60 display perceived improvements in ability to complete ADL's.   ? Baseline 02/18/2021= 55%, 12/5: 46%; 06/29/2021= 57%; 08/05/2021= 57%   ? Time 12   ? Period Weeks   ? Status On-going   ? Target Date 10/28/21   ?  ? PT LONG TERM GOAL #2  ? Title Pt will decrease 5TSTS by at least 3 seconds in order to demonstrate clinically significant improvement in LE strength.   ? Baseline 02/18/2021= 15 sec, 11/15: 17.3 sec, 12/5: 11.19s with BUE support, 14.55s no UE support; 05/12/2021= 11:35 sec   ? Time 12   ? Period Weeks   ? Status Achieved   ? Target Date 05/13/21   ?  ? PT LONG TERM GOAL #3  ? Title Patient will increase Functional Gait Assessment score to >22/30 as to reduce fall risk and improve dynamic gait safety with community ambulation.   ? Baseline 02/18/2021= 17/30, 11/15: 14/30, 12/5: 13/30; 12/14= 18/30; 06/29/2021= 19/30; 08/05/2021= 20/30   ? Time 12   ? Period Weeks   ? Status On-going   ? Target Date 10/28/21   ?  ? PT LONG TERM GOAL #4  ? Title Pt will increase 10MWT by at least 0.13 m/s in order to demonstrate clinically significant improvement in community ambulation.   ? Baseline 02/18/2021= 0.89 m/s without AD, 11/15: 0.97 m/s, 12/5: 1.23ms, no AD; 06/29/2021= 1.15 m/s without an AD   ? Time 12   ? Period Weeks   ? Status Achieved   ? Target Date 08/04/21   ?  ? PT LONG TERM GOAL #5  ? Title Patient will report decreased left side buttock pain to less than 4/10  with transfers/mobility for improved functional mobility in home and community.   ? Baseline 05/12/2021=  6/10 left gluteal pain.; 06/29/2021- Patient states his gluteal pain has much improved and only rates as "sore" at times but not really painful.   ? Time 12   ? Period Weeks   ? Status Achieved   ?  ? Additional Long Term Goals  ? Additional Long Term Goals Yes   ?  ? PT LONG TERM GOAL #6  ? Title Patient will demonstrate improved static standing balance as seen by ability to single leg stand 5 sec or greater to assist with balance with functional mobility in the community.   ? Baseline Time on each foot - Left= 2 sec; right = 1 sec   ? Time 12   ? Period Weeks   ? Status New   ? Target Date 10/28/21   ? ?  ?  ? ?  ? ? ? ? ? ? ? ? Plan - 09/28/21 1028   ? ? Clinical Impression Statement Patient presented with good motivation and participated well with balance activities- Less overall unsteadiness today improved coordination working with cone activities. He was also able to walk well throughout hospital without LOB or evidence of any excessive fatigue. Patient will benefit from further skilled PT to address these deficits in order to improve safety and independence with ambulation and functional mobility   ? Personal Factors and Comorbidities Age;Comorbidity 3+   ? Comorbidities remote TBI, frontal dementia, DM   ? Examination-Activity Limitations Carry;Other;Locomotion Level   ? Examination-Participation Restrictions Community Activity;Valla Leaver Work   ? Stability/Clinical Decision Making Stable/Uncomplicated   ? Rehab Potential Good   ? PT Frequency 1x / week   ? PT Duration 12 weeks   ? PT Treatment/Interventions ADLs/Self Care Home Management;Cryotherapy;Moist Heat;DME Instruction;Gait training;Stair training;Functional mobility training;Therapeutic activities;Therapeutic exercise;Balance training;Neuromuscular re-education;Patient/family education;Manual techniques;Passive range of motion;Dry  needling;Vestibular;Canalith Repostioning;Spinal Manipulations;Ultrasound   ? PT Next Visit Plan Continue with progressive LE Strengthening and balance activities for improved mobility static and dynamic balance. Focus on SLS

## 2021-09-29 ENCOUNTER — Other Ambulatory Visit: Payer: Self-pay

## 2021-09-29 ENCOUNTER — Ambulatory Visit
Admission: RE | Admit: 2021-09-29 | Discharge: 2021-09-29 | Disposition: A | Discharge status: Home / Self Care | Payer: Medicare HMO | Source: Ambulatory Visit | Attending: Radiation Oncology | Admitting: Radiation Oncology

## 2021-09-29 DIAGNOSIS — Z510 Encounter for antineoplastic radiation therapy: Secondary | ICD-10-CM | POA: Diagnosis not present

## 2021-09-29 LAB — RAD ONC ARIA SESSION SUMMARY
Course Elapsed Days: 34
Plan Fractions Treated to Date: 25
Plan Prescribed Dose Per Fraction: 2 Gy
Plan Total Fractions Prescribed: 25
Plan Total Prescribed Dose: 50 Gy
Reference Point Dosage Given to Date: 50 Gy
Reference Point Session Dosage Given: 2 Gy
Session Number: 25

## 2021-09-30 ENCOUNTER — Ambulatory Visit: Payer: Medicare HMO

## 2021-10-05 ENCOUNTER — Ambulatory Visit: Payer: Medicare HMO

## 2021-10-05 DIAGNOSIS — R269 Unspecified abnormalities of gait and mobility: Secondary | ICD-10-CM

## 2021-10-05 DIAGNOSIS — R278 Other lack of coordination: Secondary | ICD-10-CM

## 2021-10-05 DIAGNOSIS — R262 Difficulty in walking, not elsewhere classified: Secondary | ICD-10-CM

## 2021-10-05 DIAGNOSIS — R2681 Unsteadiness on feet: Secondary | ICD-10-CM

## 2021-10-05 DIAGNOSIS — R2689 Other abnormalities of gait and mobility: Secondary | ICD-10-CM

## 2021-10-05 DIAGNOSIS — M6281 Muscle weakness (generalized): Secondary | ICD-10-CM

## 2021-10-05 NOTE — Therapy (Signed)
?[facility] ?[place], [place], [address] ?Phone: [phone]   Fax:  [phone] ? ?Physical Therapy Treatment ? ?Patient Details  ?Name: Dustin Ferrell ?MRN: [id number] ?Date of Birth: 11/12/1945 ?Referring Provider (PT): Dr. Joselyn Arrow ? ? ?Encounter Date: 10/05/2021 ? ? PT End of Session - 10/05/21 1610   ? ? Visit Number 38   ? Number of Visits 42   ? Date for PT Re-Evaluation 10/28/21   ? Authorization Type Aetna Medicare   ? Authorization Time Period 05/12/21-08/04/21; 08/05/2021-10/28/2021   ? Progress Note Due on Visit 40   ? PT Start Time 1600   ? PT Stop Time 5093   ? PT Time Calculation (min) 44 min   ? Equipment Utilized During Treatment Gait belt   ? Activity Tolerance Patient tolerated treatment well   ? Behavior During Therapy [facility] for tasks assessed/performed   ? ?  ?  ? ?  ? ? ?Past Medical History:  ?Diagnosis Date  ? Anxiety   ? associated with frontal temporal behavioral dementia  ? Basal cell carcinoma 06/03/2021  ? Left nasal ala - needs treatment  ? Dementia with behavioral disturbance ([place])   ? Diabetes mellitus without complication ([place])   ? History of traumatic head injury   ? Hypertension   ? ? ?Past Surgical History:  ?Procedure Laterality Date  ? Eye Socket fracture repair Right   ? HERNIA REPAIR    ? ROTATOR CUFF REPAIR Left   ? TONSILLECTOMY    ? WRIST FRACTURE SURGERY Right   ? ? ?There were no vitals filed for this visit. ? ? Subjective Assessment - 10/05/21 1603   ? ? Subjective Patient reports having a good week and doing well- Denies any falls.   ? Patient is accompained by: Family member   ? Pertinent History Dustin Ferrell is a 37yoM who is referred to OPPT for imbalance and falls- pt also followed by Dr. Alvan Dame orthopedics for acute on chronic Left hip pain. Pt is followed by Dr. Manuella Ghazi c neurology. 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   ? Limitations Standing;Walking;House hold activities   ? How long can you sit comfortably? no restriction   ? How long can you stand comfortably? 15 min   ? How long can you walk comfortably? 10 min   ? Patient Stated Goals I want to improve my balance and not fall   ? Currently in Pain? No/denies   ? ?  ?  ? ?  ? ? ? ? ? ?INTERVENTIONS:  ? ?Neuro re-ed  ? ?Forward steps over orange hurdle x 2 and 2 (1/2 foam roll) in // bars without UE support x 10 down and back  ? ?Side step over orange hurdle x 2 and 2 (1/2 foam roll) in // bars without UE support x 10 down and back  ? ? ?Rocker board  A/P weight shift x 25 reps (min difficulty with posterior unsteadiness)  ? ? ?Rocker board Lat weight shift x 25 reps each side ? ?Ladder drill Forward/backward x 10- Difficulty with posterior steps with LOB several times- CGA For assist ?Ladder drill Side step x 10 reps ? ? ?step tap (foot to top of  cone) without UE support x 20 rep each  LE ? ?Gait including level surfaces and ascending steps with 1 rail 20+ steps with reciprocal steps and no observed unsteadiness or LOB.  ? ?Education provided throughout session via VC/TC and demonstration to facilitate movement at target joints and correct muscle activation for all testing and exercises performed.  ? ? ? ? ? ? ? ? ? ? ? ? ? ? PT Education - 10/05/21 1608   ? ? Education Details Exercise technique   ? Person(s) Educated Patient   ? Methods Explanation;Demonstration;Tactile cues;Verbal cues   ? Comprehension Verbalized understanding;Returned demonstration;Verbal cues required;Tactile cues required;Need further instruction   ? ?  ?  ? ?  ? ? ? PT Short Term Goals - 05/12/21 1040   ? ?  ? PT SHORT TERM GOAL #1  ? Title Pt will be independent with HEP in order to improve strength and balance in order to decrease fall risk and improve function at  home and work.   ? Baseline 11/15: not doing HEP right now; 12/5:  doing HEP with help of spouse every day, but without spouse has difficulty with compliance. 05/13/2021- Due to some mild cognitive issues- this goal is not attainable as he requires some supervision with home program.   ? Time 6   ? Period Weeks   ? Status Unable to assess   ? Target Date 04/01/21   ? ?  ?  ? ?  ? ? ? ? PT Long Term Goals - 08/05/21 1606   ? ?  ? PT LONG TERM GOAL #1  ? Title Pt will improve FOTO to target score of 60 display perceived improvements in ability to complete ADL's.   ? Baseline 02/18/2021= 55%, 12/5: 46%; 06/29/2021= 57%; 08/05/2021= 57%   ? Time 12   ? Period Weeks   ? Status On-going   ? Target Date 10/28/21   ?  ? PT LONG TERM GOAL #2  ? Title Pt will decrease 5TSTS by at least 3 seconds in order to demonstrate clinically significant improvement in LE strength.   ? Baseline 02/18/2021= 15 sec, 11/15: 17.3 sec, 12/5: 11.19s with BUE support, 14.55s no UE support; 05/12/2021= 11:35 sec   ? Time 12   ? Period Weeks   ? Status Achieved   ? Target Date 05/13/21   ?  ? PT LONG TERM GOAL #3  ? Title Patient will increase Functional Gait Assessment score to >22/30 as to reduce fall risk and improve dynamic gait safety with community ambulation.   ? Baseline 02/18/2021= 17/30, 11/15: 14/30, 12/5: 13/30; 12/14= 18/30; 06/29/2021= 19/30; 08/05/2021= 20/30   ? Time 12   ? Period Weeks   ? Status On-going   ? Target Date 10/28/21   ?  ? PT LONG TERM GOAL #4  ? Title Pt will increase 10MWT by at least 0.13 m/s in order to demonstrate clinically significant improvement in community ambulation.   ? Baseline 02/18/2021= 0.89 m/s without AD, 11/15: 0.97 m/s, 12/5: 1.67ms, no AD; 06/29/2021= 1.15 m/s without an AD   ? Time 12   ? Period Weeks   ? Status Achieved   ? Target Date 08/04/21   ?  ? PT LONG TERM GOAL #5  ? Title Patient will report decreased left side buttock pain to less than 4/10 with transfers/mobility for improved functional  mobility in home and community.   ? Baseline 05/12/2021= 6/10 left gluteal pain.; 06/29/2021- Patient states his gluteal pain has much improved and only rates as "sore"  at times but not really painful.   ? Time 12   ? Period Weeks   ? Status Achieved   ?  ? Additional Long Term Goals  ? Additional Long Term Goals Yes   ?  ? PT LONG TERM GOAL #6  ? Title Patient will demonstrate improved static standing balance as seen by ability to single leg stand 5 sec or greater to assist with balance with functional mobility in the community.   ? Baseline Time on each foot - Left= 2 sec; right = 1 sec   ? Time 12   ? Period Weeks   ? Status New   ? Target Date 10/28/21   ? ?  ?  ? ?  ? ? ? ? ? ? ? ? Plan - 10/05/21 1613   ? ? Clinical Impression Statement Patient presented with good motivation and participated well with standing balance activities. He was challenged with retro gait and any SLS with increased unsteadiness and several LOB. He exhibited no difficulty with walking throughout indoor hospital including steps. Patient will benefit from further skilled PT to address these deficits in order to improve safety and independence with ambulation and functional mobility   ? Personal Factors and Comorbidities Age;Comorbidity 3+   ? Comorbidities remote TBI, frontal dementia, DM   ? Examination-Activity Limitations Carry;Other;Locomotion Level   ? Examination-Participation Restrictions Community Activity;[employer] Work   ? Stability/Clinical Decision Making Stable/Uncomplicated   ? Rehab Potential Good   ? PT Frequency 1x / week   ? PT Duration 12 weeks   ? PT Treatment/Interventions ADLs/Self Care Home Management;Cryotherapy;Moist Heat;DME Instruction;Gait training;Stair training;Functional mobility training;Therapeutic activities;Therapeutic exercise;Balance training;Neuromuscular re-education;Patient/family education;Manual techniques;Passive range of motion;Dry needling;Vestibular;Canalith Repostioning;Spinal  Manipulations;Ultrasound   ? PT Next Visit Plan Continue with progressive LE Strengthening and balance activities for improved mobility static and dynamic balance. Focus on SLS and weight shifting/dual task activities.   ? PT

## 2021-10-07 ENCOUNTER — Ambulatory Visit: Payer: Medicare HMO

## 2021-10-12 ENCOUNTER — Ambulatory Visit: Payer: Medicare HMO

## 2021-10-12 DIAGNOSIS — R2681 Unsteadiness on feet: Secondary | ICD-10-CM

## 2021-10-12 DIAGNOSIS — R269 Unspecified abnormalities of gait and mobility: Secondary | ICD-10-CM

## 2021-10-12 DIAGNOSIS — R278 Other lack of coordination: Secondary | ICD-10-CM

## 2021-10-12 DIAGNOSIS — M6281 Muscle weakness (generalized): Secondary | ICD-10-CM

## 2021-10-12 DIAGNOSIS — R262 Difficulty in walking, not elsewhere classified: Secondary | ICD-10-CM

## 2021-10-12 DIAGNOSIS — R2689 Other abnormalities of gait and mobility: Secondary | ICD-10-CM

## 2021-10-12 NOTE — Therapy (Signed)
[facility] ?[place], [place], [address] ?Phone: [phone]   Fax:  [phone] ? ?Physical Therapy Treatment/Discharge summary ? ?Patient Details  ?Name: Dustin Ferrell ?MRN: [id number] ?Date of Birth: [birth year] ?Referring Provider (PT): Dr. Joselyn Arrow ? ? ?Encounter Date: 10/12/2021 ? ? PT End of Session - 10/12/21 1657   ? ? Visit Number 39   ? Number of Visits 42   ? Date for PT Re-Evaluation 10/28/21   ? Authorization Type Aetna Medicare   ? Authorization Time Period 05/12/21-08/04/21; 08/05/2021-10/28/2021   ? Progress Note Due on Visit 40   ? PT Start Time 1600   ? PT Stop Time 9892   ? PT Time Calculation (min) 43 min   ? Equipment Utilized During Treatment Gait belt   ? Activity Tolerance Patient tolerated treatment well   ? Behavior During Therapy [facility] for tasks assessed/performed   ? ?  ?  ? ?  ? ? ?Past Medical History:  ?Diagnosis Date  ? Anxiety   ? associated with frontal temporal behavioral dementia  ? Basal cell carcinoma 06/03/2021  ? Left nasal ala - needs treatment  ? Dementia with behavioral disturbance ([place])   ? Diabetes mellitus without complication ([place])   ? History of traumatic head injury   ? Hypertension   ? ? ?Past Surgical History:  ?Procedure Laterality Date  ? Eye Socket fracture repair Right   ? HERNIA REPAIR    ? ROTATOR CUFF REPAIR Left   ? TONSILLECTOMY    ? WRIST FRACTURE SURGERY Right   ? ? ?There were no vitals filed for this visit. ? ? Subjective Assessment - 10/12/21 1608   ? ? Subjective Patient reports doing well overall - denies any falls and states he feels good about his exercises at home.   ? Patient is accompained by: Family member   ? Pertinent History Corley Maffeo is a 37yoM who is referred to OPPT for imbalance and falls- pt also followed by Dr. Alvan Dame orthopedics for acute on chronic Left hip pain. Pt is followed by Dr. Manuella Ghazi c neurology. 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   ? Limitations Standing;Walking;House hold activities   ? How long can you sit comfortably? no restriction   ? How long can you stand comfortably? 15 min   ? How long can you walk comfortably? 10 min   ? Patient Stated Goals I want to improve my balance and not fall   ? Currently in Pain? No/denies   ? ?  ?  ? ?  ? ? ? ?INTERVENTOINS:  ? ?Reassessed HEP and all remaining LTG for PT Discharge visit.  ? ? ?FOTO: 65% - Goal met ? [facility] PT Assessment - 10/12/21 1627   ? ?  ? Functional Gait  Assessment  ? Gait assessed  Yes   ? Gait Level Surface Walks 20 ft in less than 5.5 sec, no assistive devices, good speed, no evidence for imbalance, normal gait pattern, deviates no more than 6 in outside of the 12 in walkway width.   ? Change in Gait Speed Able to smoothly change walking speed without loss of balance or gait deviation. Deviate no more than 6 in outside of the 12 in walkway width.   ? Gait  with Horizontal Head Turns Performs head turns smoothly with slight change in gait velocity (eg, minor disruption to smooth gait path), deviates 6-10 in outside 12 in walkway width, or uses an assistive device.   ? Gait with Vertical Head Turns Performs task with slight change in gait velocity (eg, minor disruption to smooth gait path), deviates 6 - 10 in outside 12 in walkway width or uses assistive device   ? Gait and Pivot Turn Pivot turns safely within 3 sec and stops quickly with no loss of balance.   ? Step Over Obstacle Is able to step over 2 stacked shoe boxes taped together (9 in total height) without changing gait speed. No evidence of imbalance.   ? Gait with Narrow Base of Support Ambulates 7-9 steps.   ? Gait with Eyes Closed Walks 20 ft, no assistive devices, good speed, no evidence of imbalance, normal gait pattern,  deviates no more than 6 in outside 12 in walkway width. Ambulates 20 ft in less than 7 sec.   ? Ambulating Backwards Walks 20 ft, no assistive devices, good speed, no evidence for imbalance, normal gait   ? Steps Alternating feet, must use rail.   ? Total Score 26   ? ?  ?  ? ?  ? ? ?FGA: 26/30- GOAL MET ? ?Single leg stance time: Multiple attempts and at best on left LE = 6 sec and on right = 4 sec- Goal Partially met ? ?HEP: Patient able to verbalize and demonstrate stretching to piriformis, seated and Standing LE strengthening including standing hip march, Standing Hip abd, Heel raises, Sit to stand; balance activities including SLS and tandem and lastly walking program on his own without prompting ? ? ? ? ?Access Code: [email or web address] ?URL: [email or web address] ?Date: 10/12/2021 ?Prepared by: Sande Brothers ? ?Exercises ?- Single Leg Stance  - 1 x daily - 7 x weekly - 10 sets - as long as possible up to 10 sec hold ?- Tandem Stance  - 1 x daily - 7 x weekly - 10 sets - 20-30 sec hold ? ? ? ? ? ? ? ? ? ? ? ? ? ? ? PT Education - 10/12/21 1659   ? ? Education Details Final review of HEP   ? Person(s) Educated Patient   ? Methods Explanation;Handout;Demonstration;Tactile cues;Verbal cues   ? Comprehension Verbalized understanding;Returned demonstration;Verbal cues required;Tactile cues required;Need further instruction   ? ?  ?  ? ?  ? ? ? PT Short Term Goals - 05/12/21 1040   ? ?  ? PT SHORT TERM GOAL #1  ? Title Pt will be independent with HEP in order to improve strength and balance in order to decrease fall risk and improve function at home and work.   ? Baseline 11/15: not doing HEP right now; 12/5:  doing HEP with help of spouse every day, but without spouse has difficulty with compliance. 05/13/2021- Due to some mild cognitive issues- this goal is not attainable as he requires some supervision with home program.   ? Time 6   ? Period Weeks   ? Status Unable to assess   ? Target Date 04/01/21    ? ?  ?  ? ?  ? ? ? ? PT Long Term Goals - 10/12/21 1611   ? ?  ? PT LONG TERM GOAL #1  ? Title Pt will improve FOTO to target score of 60 display perceived improvements in ability to complete ADL's.   ? Baseline 02/18/2021= 55%,  12/5: 46%; 06/29/2021= 57%; 08/05/2021= 57%; 10/12/2021= 65%   ? Time 12   ? Period Weeks   ? Status Achieved   ? Target Date 10/28/21   ?  ? PT LONG TERM GOAL #2  ? Title Pt will decrease 5TSTS by at least 3 seconds in order to demonstrate clinically significant improvement in LE strength.   ? Baseline 02/18/2021= 15 sec, 11/15: 17.3 sec, 12/5: 11.19s with BUE support, 14.55s no UE support; 05/12/2021= 11:35 sec   ? Time 12   ? Period Weeks   ? Status Achieved   ? Target Date 05/13/21   ?  ? PT LONG TERM GOAL #3  ? Title Patient will increase Functional Gait Assessment score to >22/30 as to reduce fall risk and improve dynamic gait safety with community ambulation.   ? Baseline 02/18/2021= 17/30, 11/15: 14/30, 12/5: 13/30; 12/14= 18/30; 06/29/2021= 19/30; 08/05/2021= 20/30; 10/12/2021= 26/30   ? Time 12   ? Period Weeks   ? Status Achieved   ? Target Date 10/28/21   ?  ? PT LONG TERM GOAL #4  ? Title Pt will increase 10MWT by at least 0.13 m/s in order to demonstrate clinically significant improvement in community ambulation.   ? Baseline 02/18/2021= 0.89 m/s without AD, 11/15: 0.97 m/s, 12/5: 1.64ms, no AD; 06/29/2021= 1.15 m/s without an AD   ? Time 12   ? Period Weeks   ? Status Achieved   ? Target Date 08/04/21   ?  ? PT LONG TERM GOAL #5  ? Title Patient will report decreased left side buttock pain to less than 4/10 with transfers/mobility for improved functional mobility in home and community.   ? Baseline 05/12/2021= 6/10 left gluteal pain.; 06/29/2021- Patient states his gluteal pain has much improved and only rates as "sore" at times but not really painful.   ? Time 12   ? Period Weeks   ? Status Achieved   ?  ? PT LONG TERM GOAL #6  ? Title Patient will demonstrate improved static standing  balance as seen by ability to single leg stand 5 sec or greater to assist with balance with functional mobility in the community.   ? Baseline Time on each foot - Left= 2 sec; right = 1 sec; 10/12/2021=Multiple

## 2021-10-14 ENCOUNTER — Ambulatory Visit: Payer: Medicare HMO

## 2021-11-04 ENCOUNTER — Encounter: Payer: Self-pay | Admitting: Radiation Oncology

## 2021-11-04 ENCOUNTER — Ambulatory Visit
Admission: RE | Admit: 2021-11-04 | Discharge: 2021-11-04 | Disposition: A | Discharge status: Home / Self Care | Payer: Medicare HMO | Source: Ambulatory Visit | Attending: Radiation Oncology | Admitting: Radiation Oncology

## 2021-11-04 VITALS — BP 152/68 | HR 68 | Temp 97.0°F | Resp 20 | Ht 66.0 in | Wt 167.1 lb

## 2021-11-04 DIAGNOSIS — C44311 Basal cell carcinoma of skin of nose: Secondary | ICD-10-CM | POA: Insufficient documentation

## 2021-11-04 DIAGNOSIS — Z923 Personal history of irradiation: Secondary | ICD-10-CM | POA: Insufficient documentation

## 2021-11-04 NOTE — Progress Notes (Signed)
Radiation Oncology Follow up Note  Name: Dustin Ferrell   Date:   11/04/2021 MRN:  [id number] DOB: 1946/04/19    [name] presents to the clinic today for 1 month follow-up status post electron-beam therapy for basal cell carcinoma nodular type of the left nose tip.  REFERRING PROVIDER: Adin Hector, MD  HPI: Patient is a 76 year old male now at 1 month having completed electron-beam therapy for basal cell carcinoma of the left nose tip.  Seen today in routine follow-up he is doing well he is without complaint.  No visual lesion is noted cosmetic result is excellent..  COMPLICATIONS OF TREATMENT: none  FOLLOW UP COMPLIANCE: keeps appointments   PHYSICAL EXAM:  BP (!) 152/68 (BP Location: Left Arm, Patient Position: Sitting, Cuff Size: Normal)   Pulse 68   Temp (!) 97 F (36.1 C) (Tympanic)   Resp 20   Ht '5\' 6"'$  (1.676 m) Comment: stated weight  Wt 167 lb 1.6 oz (75.8 kg)   BMI 26.97 kg/m  No evidence of visible lesion palpated in the nasal region.  No evidence of submental cell digastric or cervical nodes.  Well-developed well-nourished patient in NAD. HEENT reveals PERLA, EOMI, discs not visualized.  Oral cavity is clear. No oral mucosal lesions are identified. Neck is clear without evidence of cervical or supraclavicular adenopathy. Lungs are clear to A&P. Cardiac examination is essentially unremarkable with regular rate and rhythm without murmur rub or thrill. Abdomen is benign with no organomegaly or masses noted. Motor sensory and DTR levels are equal and symmetric in the upper and lower extremities. Cranial nerves II through XII are grossly intact. Proprioception is intact. No peripheral adenopathy or edema is identified. No motor or sensory levels are noted. Crude visual fields are within normal range.  RADIOLOGY RESULTS: No films for review  PLAN: Present time patient had a complete response.  We will see him back 1 more time in 4 to 5 months and then turn  follow-up care over to dermatology.  Patient is to call with any concerns.  I would like to take this opportunity to thank you for allowing me to participate in the care of your patient.Noreene Filbert, MD

## 2022-03-15 ENCOUNTER — Other Ambulatory Visit: Payer: Self-pay | Admitting: Orthopedic Surgery

## 2022-03-23 ENCOUNTER — Ambulatory Visit: Payer: Medicare HMO | Admitting: Dermatology

## 2022-03-23 DIAGNOSIS — L821 Other seborrheic keratosis: Secondary | ICD-10-CM

## 2022-03-23 DIAGNOSIS — L578 Other skin changes due to chronic exposure to nonionizing radiation: Secondary | ICD-10-CM

## 2022-03-23 DIAGNOSIS — D692 Other nonthrombocytopenic purpura: Secondary | ICD-10-CM

## 2022-03-23 DIAGNOSIS — S8991XA Unspecified injury of right lower leg, initial encounter: Secondary | ICD-10-CM

## 2022-03-23 DIAGNOSIS — T1490XA Injury, unspecified, initial encounter: Secondary | ICD-10-CM

## 2022-03-23 DIAGNOSIS — L814 Other melanin hyperpigmentation: Secondary | ICD-10-CM

## 2022-03-23 DIAGNOSIS — S8992XA Unspecified injury of left lower leg, initial encounter: Secondary | ICD-10-CM

## 2022-03-23 DIAGNOSIS — S0992XA Unspecified injury of nose, initial encounter: Secondary | ICD-10-CM

## 2022-03-23 DIAGNOSIS — Z1283 Encounter for screening for malignant neoplasm of skin: Secondary | ICD-10-CM

## 2022-03-23 DIAGNOSIS — Z85828 Personal history of other malignant neoplasm of skin: Secondary | ICD-10-CM | POA: Diagnosis not present

## 2022-03-23 DIAGNOSIS — D229 Melanocytic nevi, unspecified: Secondary | ICD-10-CM

## 2022-03-23 NOTE — Progress Notes (Signed)
   Follow-Up Visit   Subjective  Dustin Ferrell is a 76 y.o. male who presents for the following: Annual Exam (Hx BCC ). The patient presents for Total-Body Skin Exam (TBSE) for skin cancer screening and mole check.  The patient has spots, moles and lesions to be evaluated, some may be new or changing and the patient has concerns that these could be cancer.  Due to patient's H/O traumatic brain injury, frontal dementia his wife accompanies him and contributes to history.  The following portions of the chart were reviewed this encounter and updated as appropriate:   Tobacco  Allergies  Meds  Problems  Med Hx  Surg Hx  Fam Hx     Review of Systems:  No other skin or systemic complaints except as noted in HPI or Assessment and Plan.  Objective  Well appearing patient in no apparent distress; mood and affect are within normal limits.  A full examination was performed including scalp, head, eyes, ears, nose, lips, neck, chest, axillae, abdomen, back, buttocks, bilateral upper extremities, bilateral lower extremities, hands, feet, fingers, toes, fingernails, and toenails. All findings within normal limits unless otherwise noted below.  Nose, legs Crusts.   Assessment & Plan  Traumatic injury Nose, legs Benign, avoid picking.  Lentigines - Scattered tan macules - Due to sun exposure - Benign-appearing, observe - Recommend daily broad spectrum sunscreen SPF 30+ to sun-exposed areas, reapply every 2 hours as needed. - Call for any changes  Seborrheic Keratoses - Stuck-on, waxy, tan-brown papules and/or plaques  - Benign-appearing - Discussed benign etiology and prognosis. - Observe - Call for any changes  Melanocytic Nevi - Tan-brown and/or pink-flesh-colored symmetric macules and papules - Benign appearing on exam today - Observation - Call clinic for new or changing moles - Recommend daily use of broad spectrum spf 30+ sunscreen to sun-exposed areas.   Hemangiomas -  Red papules - Discussed benign nature - Observe - Call for any changes  Actinic Damage - Chronic condition, secondary to cumulative UV/sun exposure - diffuse scaly erythematous macules with underlying dyspigmentation - Recommend daily broad spectrum sunscreen SPF 30+ to sun-exposed areas, reapply every 2 hours as needed.  - Staying in the shade or wearing long sleeves, sun glasses (UVA+UVB protection) and wide brim hats (4-inch brim around the entire circumference of the hat) are also recommended for sun protection.  - Call for new or changing lesions.  History of Basal Cell Carcinoma of the Skin - No evidence of recurrence today - Recommend regular full body skin exams - Recommend daily broad spectrum sunscreen SPF 30+ to sun-exposed areas, reapply every 2 hours as needed.  - Call if any new or changing lesions are noted between office visits  Cafe au Lait - R buttock - Tan patch - Genetic - Benign, observe - Call for any changes  Purpura - Chronic; persistent and recurrent.  Treatable, but not curable. - Violaceous macules and patches - Benign - Related to trauma, age, sun damage and/or use of blood thinners, chronic use of topical and/or oral steroids - Observe - Can use OTC arnica containing moisturizer such as Dermend Bruise Formula if desired - Call for worsening or other concerns  Skin cancer screening performed today.  Return in about 1 year (around 03/24/2023) for TBSE.  Luther Redo, CMA, am acting as scribe for Sarina Ser, MD . Documentation: I have reviewed the above documentation for accuracy and completeness, and I agree with the above.  Sarina Ser, MD

## 2022-03-23 NOTE — Patient Instructions (Signed)
Due to recent changes in healthcare laws, you may see results of your pathology and/or laboratory studies on MyChart before the doctors have had a chance to review them. We understand that in some cases there may be results that are confusing or concerning to you. Please understand that not all results are received at the same time and often the doctors may need to interpret multiple results in order to provide you with the best plan of care or course of treatment. Therefore, we ask that you please give us 2 business days to thoroughly review all your results before contacting the office for clarification. Should we see a critical lab result, you will be contacted sooner.   If You Need Anything After Your Visit  If you have any questions or concerns for your doctor, please call our main line at [phone] and press option 4 to reach your doctor's medical assistant. If no one answers, please leave a voicemail as directed and we will return your call as soon as possible. Messages left after 4 pm will be answered the following business day.   You may also send us a message via MyChart. We typically respond to MyChart messages within 1-2 business days.  For prescription refills, please ask your pharmacy to contact our office. Our fax number is [phone].  If you have an urgent issue when the clinic is closed that cannot wait until the next business day, you can page your doctor at the number below.    Please note that while we do our best to be available for urgent issues outside of office hours, we are not available 24/7.   If you have an urgent issue and are unable to reach us, you may choose to seek medical care at your doctor's office, retail clinic, urgent care center, or emergency room.  If you have a medical emergency, please immediately call 911 or go to the emergency department.  Pager Numbers  - Dr. Kowalski: [phone]  - Dr. Moye: [phone]  - Dr. Stewart:  [phone]  In the event of inclement weather, please call our main line at [phone] for an update on the status of any delays or closures.  Dermatology Medication Tips: Please keep the boxes that topical medications come in in order to help keep track of the instructions about where and how to use these. Pharmacies typically print the medication instructions only on the boxes and not directly on the medication tubes.   If your medication is too expensive, please contact our office at [phone] option 4 or send us a message through MyChart.   We are unable to tell what your co-pay for medications will be in advance as this is different depending on your insurance coverage. However, we may be able to find a substitute medication at lower cost or fill out paperwork to get insurance to cover a needed medication.   If a prior authorization is required to get your medication covered by your insurance company, please allow us 1-2 business days to complete this process.  Drug prices often vary depending on where the prescription is filled and some pharmacies may offer cheaper prices.  The website www.goodrx.com contains coupons for medications through different pharmacies. The prices here do not account for what the cost may be with help from insurance (it may be cheaper with your insurance), but the website can give you the price if you did not use any insurance.  - You can print the associated coupon and take it with   your prescription to the pharmacy.  - You may also stop by our office during regular business hours and pick up a GoodRx coupon card.  - If you need your prescription sent electronically to a different pharmacy, notify our office through [place] MyChart or by phone at [phone] option 4.     Si Usted Necesita Algo Despus de Su Visita  Tambin puede enviarnos un mensaje a travs de MyChart. Por lo general respondemos a los mensajes de MyChart en el transcurso de 1 a 2  das hbiles.  Para renovar recetas, por favor pida a su farmacia que se ponga en contacto con nuestra oficina. Nuestro nmero de fax es el [phone].  Si tiene un asunto urgente cuando la clnica est cerrada y que no puede esperar hasta el siguiente da hbil, puede llamar/localizar a su doctor(a) al nmero que aparece a continuacin.   Por favor, tenga en cuenta que aunque hacemos todo lo posible para estar disponibles para asuntos urgentes fuera del horario de oficina, no estamos disponibles las 24 horas del da, los 7 das de la semana.   Si tiene un problema urgente y no puede comunicarse con nosotros, puede optar por buscar atencin mdica  en el consultorio de su doctor(a), en una clnica privada, en un centro de atencin urgente o en una sala de emergencias.  Si tiene una emergencia mdica, por favor llame inmediatamente al 911 o vaya a la sala de emergencias.  Nmeros de bper  - Dr. Kowalski: [phone]  - Dra. Moye: [phone]  - Dra. Stewart: [phone]  En caso de inclemencias del tiempo, por favor llame a nuestra lnea principal al [phone] para una actualizacin sobre el estado de cualquier retraso o cierre.  Consejos para la medicacin en dermatologa: Por favor, guarde las cajas en las que vienen los medicamentos de uso tpico para ayudarle a seguir las instrucciones sobre dnde y cmo usarlos. Las farmacias generalmente imprimen las instrucciones del medicamento slo en las cajas y no directamente en los tubos del medicamento.   Si su medicamento es muy caro, por favor, pngase en contacto con nuestra oficina llamando al [phone] y presione la opcin 4 o envenos un mensaje a travs de MyChart.   No podemos decirle cul ser su copago por los medicamentos por adelantado ya que esto es diferente dependiendo de la cobertura de su seguro. Sin embargo, es posible que podamos encontrar un medicamento sustituto a menor costo o llenar un formulario para que el  seguro cubra el medicamento que se considera necesario.   Si se requiere una autorizacin previa para que su compaa de seguros cubra su medicamento, por favor permtanos de 1 a 2 das hbiles para completar este proceso.  Los precios de los medicamentos varan con frecuencia dependiendo del lugar de dnde se surte la receta y alguna farmacias pueden ofrecer precios ms baratos.  El sitio web www.goodrx.com tiene cupones para medicamentos de diferentes farmacias. Los precios aqu no tienen en cuenta lo que podra costar con la ayuda del seguro (puede ser ms barato con su seguro), pero el sitio web puede darle el precio si no utiliz ningn seguro.  - Puede imprimir el cupn correspondiente y llevarlo con su receta a la farmacia.  - Tambin puede pasar por nuestra oficina durante el horario de atencin regular y recoger una tarjeta de cupones de GoodRx.  - Si necesita que su receta se enve electrnicamente a una farmacia diferente, informe a nuestra oficina a travs de MyChart de [place]   o por telfono llamando al [phone] y presione la opcin 4.  

## 2022-03-25 ENCOUNTER — Ambulatory Visit: Payer: Medicare HMO | Admitting: Radiation Oncology

## 2022-03-28 ENCOUNTER — Encounter: Payer: Self-pay | Admitting: Dermatology

## 2022-04-11 ENCOUNTER — Ambulatory Visit
Admission: RE | Admit: 2022-04-11 | Discharge: 2022-04-11 | Disposition: A | Discharge status: Home / Self Care | Payer: Medicare HMO | Source: Ambulatory Visit | Attending: Radiation Oncology | Admitting: Radiation Oncology

## 2022-04-11 ENCOUNTER — Encounter: Payer: Self-pay | Admitting: Radiation Oncology

## 2022-04-11 VITALS — BP 129/69 | HR 74 | Temp 97.6°F | Resp 20 | Ht 66.0 in | Wt 163.7 lb

## 2022-04-11 DIAGNOSIS — C44311 Basal cell carcinoma of skin of nose: Secondary | ICD-10-CM | POA: Diagnosis present

## 2022-04-11 DIAGNOSIS — Z923 Personal history of irradiation: Secondary | ICD-10-CM | POA: Insufficient documentation

## 2022-04-11 NOTE — Progress Notes (Signed)
Radiation Oncology Follow up Note  Name: Dustin Ferrell   Date:   04/11/2022 MRN:  [id number] DOB: 19-Oct-1945    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  Seen today in routine follow-up he is doing well.  He has no complaints..  COMPLICATIONS OF TREATMENT: none  FOLLOW UP COMPLIANCE: keeps appointments   PHYSICAL EXAM:  BP 129/69 (BP Location: Right Arm, Patient Position: Sitting, Cuff Size: Normal)   Pulse 74   Temp 97.6 F (36.4 C) (Tympanic)   Resp 20   Ht '5\' 6"'$  (1.676 m)   Wt 163 lb 11.2 oz (74.3 kg)   BMI 26.42 kg/m  Nose cosmetically looks excellent no evidence of mass or nodularity is identified.  No evidence of submental or cervical adenopathy is appreciated.  Well-developed well-nourished patient in NAD. HEENT reveals PERLA, EOMI, discs not visualized.  Oral cavity is clear. No oral mucosal lesions are identified. Neck is clear without evidence of cervical or supraclavicular adenopathy. Lungs are clear to A&P. Cardiac examination is essentially unremarkable with regular rate and rhythm without murmur rub or thrill. Abdomen is benign with no organomegaly or masses noted. Motor sensory and DTR levels are equal and symmetric in the upper and lower extremities. Cranial nerves II through XII are grossly intact. Proprioception is intact. No peripheral adenopathy or edema is identified. No motor or sensory levels are noted. Crude visual fields are within normal range.  RADIOLOGY RESULTS: No current films for review  PLAN: At this time patient has no evidence of disease status post electron-beam therapy to his nose for basal cell carcinoma.  I am going to  turn follow-up care over to dermatology.  I be happy to reevaluate the patient at any time should that be indicated.  I would like to take this opportunity to thank you for allowing me to participate in the care of your patient..Noreene Filbert MD

## 2023-03-29 ENCOUNTER — Ambulatory Visit: Payer: Medicare HMO | Admitting: Dermatology

## 2023-03-29 ENCOUNTER — Encounter: Payer: Self-pay | Admitting: Dermatology

## 2023-03-29 DIAGNOSIS — L820 Inflamed seborrheic keratosis: Secondary | ICD-10-CM | POA: Diagnosis not present

## 2023-03-29 DIAGNOSIS — W908XXA Exposure to other nonionizing radiation, initial encounter: Secondary | ICD-10-CM | POA: Diagnosis not present

## 2023-03-29 DIAGNOSIS — Z1283 Encounter for screening for malignant neoplasm of skin: Secondary | ICD-10-CM

## 2023-03-29 DIAGNOSIS — D1801 Hemangioma of skin and subcutaneous tissue: Secondary | ICD-10-CM

## 2023-03-29 DIAGNOSIS — L578 Other skin changes due to chronic exposure to nonionizing radiation: Secondary | ICD-10-CM

## 2023-03-29 DIAGNOSIS — L814 Other melanin hyperpigmentation: Secondary | ICD-10-CM | POA: Diagnosis not present

## 2023-03-29 DIAGNOSIS — L821 Other seborrheic keratosis: Secondary | ICD-10-CM

## 2023-03-29 DIAGNOSIS — L813 Cafe au lait spots: Secondary | ICD-10-CM

## 2023-03-29 DIAGNOSIS — D692 Other nonthrombocytopenic purpura: Secondary | ICD-10-CM

## 2023-03-29 DIAGNOSIS — D229 Melanocytic nevi, unspecified: Secondary | ICD-10-CM

## 2023-03-29 DIAGNOSIS — Z85828 Personal history of other malignant neoplasm of skin: Secondary | ICD-10-CM

## 2023-03-29 NOTE — Patient Instructions (Addendum)

## 2023-03-29 NOTE — Progress Notes (Signed)
Follow-Up Visit   Subjective  Dustin Ferrell is a 77 y.o. male who presents for the following: Skin Cancer Screening and Full Body Skin Exam. Hx of BCC. Left nasal ala. Tx with radiation by Dr. Rushie Chestnut.   The patient presents for Total-Body Skin Exam (TBSE) for skin cancer screening and mole check. The patient has spots, moles and lesions to be evaluated, some may be new or changing and the patient may have concern these could be cancer.  Wife is with patient and contributes to history.   The following portions of the chart were reviewed this encounter and updated as appropriate: medications, allergies, medical history  Review of Systems:  No other skin or systemic complaints except as noted in HPI or Assessment and Plan.  Objective  Well appearing patient in no apparent distress; mood and affect are within normal limits.  A full examination was performed including scalp, head, eyes, ears, nose, lips, neck, chest, axillae, abdomen, back, buttocks, bilateral upper extremities, bilateral lower extremities, hands, feet, fingers, toes, fingernails, and toenails. All findings within normal limits unless otherwise noted below.   Relevant physical exam findings are noted in the Assessment and Plan.  Right Lateral Neck x1, R cheek x2 (3) Erythematous keratotic or waxy stuck-on papule or plaque.    Assessment & Plan   HISTORY OF BASAL CELL CARCINOMA OF THE SKIN. Left nasal ala. Tx with radiation.  - No evidence of recurrence today - Recommend regular full body skin exams - Recommend daily broad spectrum sunscreen SPF 30+ to sun-exposed areas, reapply every 2 hours as needed.  - Call if any new or changing lesions are noted between office visits   SKIN CANCER SCREENING PERFORMED TODAY.  ACTINIC DAMAGE - Chronic condition, secondary to cumulative UV/sun exposure - diffuse scaly erythematous macules with underlying dyspigmentation - Recommend daily broad spectrum sunscreen SPF 30+ to  sun-exposed areas, reapply every 2 hours as needed.  - Staying in the shade or wearing long sleeves, sun glasses (UVA+UVB protection) and wide brim hats (4-inch brim around the entire circumference of the hat) are also recommended for sun protection.  - Call for new or changing lesions.  LENTIGINES, SEBORRHEIC KERATOSES, HEMANGIOMAS - Benign normal skin lesions - Benign-appearing - Call for any changes  MELANOCYTIC NEVI - Tan-brown and/or pink-flesh-colored symmetric macules and papules - Benign appearing on exam today - Observation - Call clinic for new or changing moles - Recommend daily use of broad spectrum spf 30+ sunscreen to sun-exposed areas.    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  Treatable, but not curable. - Violaceous macules and patches - Benign - Related to trauma, age, sun damage and/or use of blood thinners, chronic use of topical and/or oral steroids - Observe - Can use OTC arnica containing moisturizer such as Dermend Bruise Formula if desired - Call for worsening or other concerns   Inflamed seborrheic keratosis (3) Right Lateral Neck x1, R cheek x2  Symptomatic, irritating, patient would like treated.  Destruction of lesion - Right Lateral Neck x1, R cheek x2 (3) Complexity: simple   Destruction method: cryotherapy   Informed consent: discussed and consent obtained   Timeout:  patient name, date of birth, surgical site, and procedure verified Lesion destroyed using liquid nitrogen: Yes   Region frozen until ice ball extended beyond lesion: Yes   Outcome: patient tolerated procedure well with  no complications   Post-procedure details: wound care instructions given   Additional details:  Prior to procedure, discussed risks of blister formation, small wound, skin dyspigmentation, or rare scar following cryotherapy. Recommend Vaseline ointment to treated  areas while healing.    Return in about 8 months (around 11/27/2023) for ISK Follow Up, Recheck nose Hx BCC.  I, Lawson Radar, CMA, am acting as scribe for Armida Sans, MD.   Documentation: I have reviewed the above documentation for accuracy and completeness, and I agree with the above.  Armida Sans, MD

## 2023-08-11 ENCOUNTER — Other Ambulatory Visit: Payer: Self-pay | Admitting: Student

## 2023-08-11 DIAGNOSIS — F02818 Dementia in other diseases classified elsewhere, unspecified severity, with other behavioral disturbance: Secondary | ICD-10-CM

## 2023-08-17 ENCOUNTER — Ambulatory Visit

## 2023-08-17 ENCOUNTER — Ambulatory Visit
Admission: RE | Admit: 2023-08-17 | Discharge: 2023-08-17 | Disposition: A | Discharge status: Home / Self Care | Source: Ambulatory Visit | Attending: Student | Admitting: Student

## 2023-08-17 DIAGNOSIS — F02818 Dementia in other diseases classified elsewhere, unspecified severity, with other behavioral disturbance: Secondary | ICD-10-CM | POA: Insufficient documentation

## 2023-08-17 DIAGNOSIS — S069XAS Unspecified intracranial injury with loss of consciousness status unknown, sequela: Secondary | ICD-10-CM | POA: Diagnosis present

## 2023-11-16 ENCOUNTER — Ambulatory Visit: Payer: Medicare HMO | Admitting: Dermatology

## 2023-12-05 ENCOUNTER — Encounter: Payer: Self-pay | Admitting: Dermatology

## 2023-12-05 ENCOUNTER — Ambulatory Visit: Admitting: Dermatology

## 2023-12-05 DIAGNOSIS — L814 Other melanin hyperpigmentation: Secondary | ICD-10-CM | POA: Diagnosis not present

## 2023-12-05 DIAGNOSIS — L578 Other skin changes due to chronic exposure to nonionizing radiation: Secondary | ICD-10-CM

## 2023-12-05 DIAGNOSIS — Z85828 Personal history of other malignant neoplasm of skin: Secondary | ICD-10-CM

## 2023-12-05 DIAGNOSIS — D692 Other nonthrombocytopenic purpura: Secondary | ICD-10-CM

## 2023-12-05 DIAGNOSIS — W908XXA Exposure to other nonionizing radiation, initial encounter: Secondary | ICD-10-CM

## 2023-12-05 DIAGNOSIS — L820 Inflamed seborrheic keratosis: Secondary | ICD-10-CM

## 2023-12-05 DIAGNOSIS — D1801 Hemangioma of skin and subcutaneous tissue: Secondary | ICD-10-CM

## 2023-12-05 DIAGNOSIS — Z1283 Encounter for screening for malignant neoplasm of skin: Secondary | ICD-10-CM

## 2023-12-05 DIAGNOSIS — L918 Other hypertrophic disorders of the skin: Secondary | ICD-10-CM

## 2023-12-05 DIAGNOSIS — D229 Melanocytic nevi, unspecified: Secondary | ICD-10-CM

## 2023-12-05 DIAGNOSIS — L821 Other seborrheic keratosis: Secondary | ICD-10-CM

## 2023-12-05 NOTE — Patient Instructions (Addendum)
 Seborrheic Keratosis  What causes seborrheic keratoses? Seborrheic keratoses are harmless, common skin growths that first appear during adult life.  As time goes by, more growths appear.  Some people may develop a large number of them.  Seborrheic keratoses appear on both covered and uncovered body parts.  They are not caused by sunlight.  The tendency to develop seborrheic keratoses can be inherited.  They vary in color from skin-colored to gray, brown, or even black.  They can be either smooth or have a rough, warty surface.   Seborrheic keratoses are superficial and look as if they were stuck on the skin.  Under the microscope this type of keratosis looks like layers upon layers of skin.  That is why at times the top layer may seem to fall off, but the rest of the growth remains and re-grows.    Treatment Seborrheic keratoses do not need to be treated, but can easily be removed in the office.  Seborrheic keratoses often cause symptoms when they rub on clothing or jewelry.  Lesions can be in the way of shaving.  If they become inflamed, they can cause itching, soreness, or burning.  Removal of a seborrheic keratosis can be accomplished by freezing, burning, or surgery. If any spot bleeds, scabs, or grows rapidly, please return to have it checked, as these can be an indication of a skin cancer.   Cryotherapy Aftercare  Wash gently with soap and water everyday.   Apply Vaseline and Band-Aid daily until healed.    Due to recent changes in healthcare laws, you may see results of your pathology and/or laboratory studies on MyChart before the doctors have had a chance to review them. We understand that in some cases there may be results that are confusing or concerning to you. Please understand that not all results are received at the same time and often the doctors may need to interpret multiple results in order to provide you with the best plan of care or course of treatment. Therefore, we ask that  you please give us  2 business days to thoroughly review all your results before contacting the office for clarification. Should we see a critical lab result, you will be contacted sooner.   Melanoma ABCDEs  Melanoma is the most dangerous type of skin cancer, and is the leading cause of death from skin disease.  You are more likely to develop melanoma if you: Have light-colored skin, light-colored eyes, or red or blond hair Spend a lot of time in the sun Tan regularly, either outdoors or in a tanning bed Have had blistering sunburns, especially during childhood Have a close family member who has had a melanoma Have atypical moles or large birthmarks  Early detection of melanoma is key since treatment is typically straightforward and cure rates are extremely high if we catch it early.   The first sign of melanoma is often a change in a mole or a new dark spot.  The ABCDE system is a way of remembering the signs of melanoma.  A for asymmetry:  The two halves do not match. B for border:  The edges of the growth are irregular. C for color:  A mixture of colors are present instead of an even brown color. D for diameter:  Melanomas are usually (but not always) greater than 6mm - the size of a pencil eraser. E for evolution:  The spot keeps changing in size, shape, and color.  Please check your skin once per month between visits.  You can use a small mirror in front and a large mirror behind you to keep an eye on the back side or your body.   If you see any new or changing lesions before your next follow-up, please call to schedule a visit.  Please continue daily skin protection including broad spectrum sunscreen SPF 30+ to sun-exposed areas, reapplying every 2 hours as needed when you're outdoors.   Staying in the shade or wearing long sleeves, sun glasses (UVA+UVB protection) and wide brim hats (4-inch brim around the entire circumference of the hat) are also recommended for sun protection.      If You Need Anything After Your Visit  If you have any questions or concerns for your doctor, please call our main line at [phone] and press option 4 to reach your doctor's medical assistant. If no one answers, please leave a voicemail as directed and we will return your call as soon as possible. Messages left after 4 pm will be answered the following business day.   You may also send us  a message via MyChart. We typically respond to MyChart messages within 1-2 business days.  For prescription refills, please ask your pharmacy to contact our office. Our fax number is [phone].  If you have an urgent issue when the clinic is closed that cannot wait until the next business day, you can page your doctor at the number below.    Please note that while we do our best to be available for urgent issues outside of office hours, we are not available 24/7.   If you have an urgent issue and are unable to reach us , you may choose to seek medical care at your doctor's office, retail clinic, urgent care center, or emergency room.  If you have a medical emergency, please immediately call 911 or go to the emergency department.  Pager Numbers  - Dr. Hester: [phone]  - Dr. Jackquline: [phone]  - Dr. Claudene: [phone]   In the event of inclement weather, please call our main line at [phone] for an update on the status of any delays or closures.  Dermatology Medication Tips: Please keep the boxes that topical medications come in in order to help keep track of the instructions about where and how to use these. Pharmacies typically print the medication instructions only on the boxes and not directly on the medication tubes.   If your medication is too expensive, please contact our office at [phone] option 4 or send us  a message through MyChart.   We are unable to tell what your co-pay for medications will be in advance as this is different depending on your insurance  coverage. However, we may be able to find a substitute medication at lower cost or fill out paperwork to get insurance to cover a needed medication.   If a prior authorization is required to get your medication covered by your insurance company, please allow us  1-2 business days to complete this process.  Drug prices often vary depending on where the prescription is filled and some pharmacies may offer cheaper prices.  The website www.goodrx.com contains coupons for medications through different pharmacies. The prices here do not account for what the cost may be with help from insurance (it may be cheaper with your insurance), but the website can give you the price if you did not use any insurance.  - You can print the associated coupon and take it with your prescription to the pharmacy.  - You may also stop by  our office during regular business hours and pick up a GoodRx coupon card.  - If you need your prescription sent electronically to a different pharmacy, notify our office through [facility] or by phone at [phone] option 4.     Si Usted Necesita Algo Despus de Su Visita  Tambin puede enviarnos un mensaje a travs de Clinical cytogeneticist. Por lo general respondemos a los mensajes de MyChart en el transcurso de 1 a 2 das hbiles.  Para renovar recetas, por favor pida a su farmacia que se ponga en contacto con nuestra oficina. [place]i [place] de fax es [place] [phone].  Si tiene un asunto urgente cuando la clnica est cerrada y que no puede esperar hasta el siguiente da hbil, puede llamar/localizar a su doctor(a) al nmero que aparece a continuacin.   Por favor, tenga en cuenta que aunque hacemos todo lo posible para estar disponibles para asuntos urgentes fuera del horario de [place], no estamos disponibles las 24 horas del da, los 7 [address]  [address] de la [place].   Si tiene un problema urgente y no puede comunicarse con nosotros, puede optar por buscar atencin mdica  en el consultorio de  su doctor(a), en una clnica privada, en un centro de atencin urgente o en una sala de emergencias.  Si tiene Engineer, drilling, por favor llame inmediatamente al 911 o vaya a la sala de emergencias.  Nmeros de bper  - Dr. Hester: [phone]  - Dra. Jackquline: [phone]  - Dr. Claudene: [phone]   En caso de inclemencias del tiempo, por favor llame a landry capes principal al [phone] para una actualizacin sobre el [place] de cualquier retraso o cierre.  Consejos para la medicacin en dermatologa: Por favor, guarde las cajas en las que vienen los medicamentos de uso tpico para ayudarle a seguir las instrucciones sobre dnde y cmo usarlos. Las farmacias generalmente imprimen las instrucciones del medicamento slo en las cajas y no directamente en los tubos del [place].   Si su medicamento es muy caro, por favor, pngase en contacto con landry rieger llamando al [phone] y presione la opcin 4 o envenos un mensaje a travs de Clinical cytogeneticist.   No podemos decirle cul ser su copago por los medicamentos por adelantado ya que esto es diferente dependiendo de la cobertura de su seguro. Sin embargo, es posible que podamos encontrar un medicamento sustituto a Audiological scientist un formulario para que el seguro cubra el medicamento que se considera necesario.   Si se requiere una autorizacin previa para que su compaa de seguros malta su medicamento, por favor permtanos de 1 a 2 das hbiles para completar este proceso.  Los precios de los medicamentos varan con frecuencia dependiendo del Environmental consultant de dnde se surte la receta y alguna farmacias pueden ofrecer precios ms baratos.  El sitio web www.goodrx.com tiene cupones para medicamentos de Health and safety inspector. Los precios aqu no tienen en cuenta lo que podra costar con la ayuda del seguro (puede ser ms barato con su seguro), pero el sitio web puede darle el precio si no utiliz Tourist information centre manager.  - Puede imprimir el  cupn correspondiente y llevarlo con su receta a la farmacia.  - Tambin puede pasar por nuestra oficina durante el horario de atencin regular y Education officer, museum una tarjeta de cupones de GoodRx.  - Si necesita que su receta se enve electrnicamente a una farmacia diferente, informe a nuestra oficina a travs de MyChart de [place] o por telfono llamando al [phone] y presione la opcin 4.

## 2023-12-05 NOTE — Progress Notes (Unsigned)
 Follow-Up Visit   Subjective  Dustin Ferrell is a 78 y.o. male who presents for the following:  8 month isk follow up and recheck nose hx of bcc at left nasal ala treated with radiation   Right scalp postauricular   The following portions of the chart were reviewed this encounter and updated as appropriate: medications, allergies, medical history  Review of Systems:  No other skin or systemic complaints except as noted in HPI or Assessment and Plan.  Objective  Well appearing patient in no apparent distress; mood and affect are within normal limits.  A focused examination was performed of the following areas: Face, scalp, neck, arms, hands , legs  Except for underwear area and feet   Relevant exam findings are noted in the Assessment and Plan.  right scalp postauricular x 3, left elbow x 2, left forearm x 1 (6) Erythematous stuck-on, waxy papule or plaque  Assessment & Plan   SEBORRHEIC KERATOSIS - Stuck-on, waxy, tan-brown papules and/or plaques  - Benign-appearing - Discussed benign etiology and prognosis. - Observe - Call for any changes  Acrochordons (Skin Tags) At neck  - Fleshy, skin-colored pedunculated papules - Benign appearing.  - Observe. - If desired, they can be removed with an in office procedure that is not covered by insurance. - Please call the clinic if you notice any new or changing lesions.  Purpura - Chronic; persistent and recurrent.  Treatable, but not curable. At arms  - Violaceous macules and patches - Benign - Related to trauma, age, sun damage and/or use of blood thinners, chronic use of topical and/or oral steroids - Observe - Can use OTC arnica containing moisturizer such as Dermend Bruise Formula if desired - Call for worsening or other concerns  INFLAMED SEBORRHEIC KERATOSIS (6) right scalp postauricular x 3, left elbow x 2, left forearm x 1 (6) Symptomatic, irritating, patient would like treated. Destruction of lesion - right  scalp postauricular x 3, left elbow x 2, left forearm x 1 (6) Complexity: simple   Destruction method: cryotherapy   Informed consent: discussed and consent obtained   Timeout:  patient name, date of birth, surgical site, and procedure verified Lesion destroyed using liquid nitrogen: Yes   Region frozen until ice ball extended beyond lesion: Yes   Outcome: patient tolerated procedure well with no complications   Post-procedure details: wound care instructions given         Follow-Up Visit   Subjective  Dustin Ferrell is a 78 y.o. male who presents for the following: Skin Cancer Screening and Full Body Skin Exam  The patient presents for Total-Body Skin Exam (TBSE) for skin cancer screening and mole check. The patient has spots, moles and lesions to be evaluated, some may be new or changing and the patient may have concern these could be cancer.    The following portions of the chart were reviewed this encounter and updated as appropriate: medications, allergies, medical history  Review of Systems:  No other skin or systemic complaints except as noted in HPI or Assessment and Plan.  Objective  Well appearing patient in no apparent distress; mood and affect are within normal limits.  A full examination was performed including scalp, head, eyes, ears, nose, lips, neck, chest, axillae, abdomen, back, buttocks, bilateral upper extremities, bilateral lower extremities, hands, feet, fingers, toes, fingernails, and toenails. All findings within normal limits unless otherwise noted below.   Relevant physical exam findings are noted in the Assessment and Plan.  right scalp postauricular x 3, left elbow x 2, left forearm x 1 (6) Erythematous stuck-on, waxy papule or plaque  Assessment & Plan   SKIN CANCER SCREENING PERFORMED TODAY.  ACTINIC DAMAGE - Chronic condition, secondary to cumulative UV/sun exposure - diffuse scaly erythematous macules with underlying dyspigmentation -  Recommend daily broad spectrum sunscreen SPF 30+ to sun-exposed areas, reapply every 2 hours as needed.  - Staying in the shade or wearing long sleeves, sun glasses (UVA+UVB protection) and wide brim hats (4-inch brim around the entire circumference of the hat) are also recommended for sun protection.  - Call for new or changing lesions.  LENTIGINES, SEBORRHEIC KERATOSES, HEMANGIOMAS - Benign normal skin lesions - Benign-appearing - Call for any changes  MELANOCYTIC NEVI - Tan-brown and/or pink-flesh-colored symmetric macules and papules - Benign appearing on exam today - Observation - Call clinic for new or changing moles - Recommend daily use of broad spectrum spf 30+ sunscreen to sun-exposed areas.   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 Tx with radiation.  - No evidence of recurrence today - Recommend regular full body skin exams - Recommend daily broad spectrum sunscreen SPF 30+ to sun-exposed areas, reapply every 2 hours as needed.  - Call if any new or changing lesions are noted between office visits     INFLAMED SEBORRHEIC KERATOSIS (6) right scalp postauricular x 3, left elbow x 2, left forearm x 1 (6) Symptomatic, irritating, patient would like treated. Destruction of lesion - right scalp postauricular x 3, left elbow x 2, left forearm x 1 (6) Complexity: simple   Destruction method: cryotherapy   Informed consent: discussed and consent obtained   Timeout:  patient name, date of birth, surgical site, and procedure verified Lesion destroyed using liquid nitrogen: Yes   Region frozen until ice ball extended beyond lesion: Yes   Outcome: patient tolerated procedure well with no complications   Post-procedure details: wound care instructions given   Return for 1 year tbse hx of bcc .  IEleanor Blush, CMA, am acting as scribe for Alm Rhyme, MD.   Documentation: I have reviewed the above documentation for accuracy and completeness,  and I agree with the above.  Alm Rhyme, MD

## 2023-12-06 ENCOUNTER — Encounter: Payer: Self-pay | Admitting: Dermatology

## 2024-01-01 ENCOUNTER — Other Ambulatory Visit: Payer: Self-pay | Admitting: Family Medicine

## 2024-01-01 DIAGNOSIS — R0789 Other chest pain: Secondary | ICD-10-CM

## 2024-01-01 DIAGNOSIS — Z9189 Other specified personal risk factors, not elsewhere classified: Secondary | ICD-10-CM

## 2024-01-01 DIAGNOSIS — I10 Essential (primary) hypertension: Secondary | ICD-10-CM

## 2024-01-11 ENCOUNTER — Ambulatory Visit
Admission: RE | Admit: 2024-01-11 | Discharge: 2024-01-11 | Disposition: A | Discharge status: Home / Self Care | Payer: Self-pay | Source: Ambulatory Visit | Attending: Family Medicine | Admitting: Family Medicine

## 2024-01-11 DIAGNOSIS — Z9189 Other specified personal risk factors, not elsewhere classified: Secondary | ICD-10-CM | POA: Insufficient documentation

## 2024-01-11 DIAGNOSIS — I10 Essential (primary) hypertension: Secondary | ICD-10-CM | POA: Insufficient documentation

## 2024-01-11 DIAGNOSIS — R0789 Other chest pain: Secondary | ICD-10-CM | POA: Insufficient documentation

## 2024-01-22 ENCOUNTER — Other Ambulatory Visit

## 2024-02-15 ENCOUNTER — Other Ambulatory Visit

## 2024-03-15 NOTE — Progress Notes (Signed)
 Established patient Visit    Chief Complaint: Coronary artery disease Chief Complaint  Patient presents with  . Follow-up    Follow up testing    Date of Service: 03/15/2024 Date of Birth: 1945-07-12 PCP: Fernande Ophelia Marinell DOUGLAS, MD  History of Present Illness: Mr. Accardo is a 78 y.o.male patient who presented for coronary artery disease.  Past medical history significant for coronary artery disease as noted by high CAC score of 2232 on cardiac CT 12/2023, tobacco abuse, diabetes type 2, hypertension, hyperlipidemia.  Stress test 02/2024 normal with no ischemia.  Echocardiogram 02/2024 normal biventricular systolic function, LVEF 55% with no significant valvular abnormality.  He is accompanied by his wife to visit, daughter is a development worker, community.  Does usual household activities with no complaints of chest pain/pressure.  Has some chronic exertional dyspnea.  No palpitation, dizziness or syncope.    Past Medical and Surgical History  Past Medical History Past Medical History:  Diagnosis Date  . Adrenal nodule (HHS-HCC) 07/16/2019  . Anxiety   . Arthritis   . B12 deficiency 01/22/2020  . 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 Cigarette nicotine dependence with nicotine-induced disorder   . Colon polyps 07/07/2014  . Combined hyperlipidemia associated with type 2 diabetes mellitus (CMS/HHS-HCC) 07/07/2014  . Diabetes mellitus due to underlying condition without complication, with long-term current use of insulin  (CMS/HHS-HCC) 04/06/2017   Neurology recommends to avoid hypoglycemia.  . 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 GERD (gastroesophageal reflux disease)   . Hypertension associated with diabetes (CMS/HHS-HCC) 07/07/2014  . Iron deficiency anemia   . 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 Myopathy   . Obstructive sleep apnea 10/15/2014  . Orthostatic hypotension  dysautonomic syndrome 07/15/2014  . 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 Personal history of traumatic brain injury 07/07/2014  . Renal stone 03/03/2020   CT 9/21 with nonobstructive right renal stone  . Seizure disorder (CMS/HHS-HCC) 10/21/2019   Followed by [facility] Neurology  . Substance abuse (CMS-HCC)   . Tremor   . Vitamin D  insufficiency 04/28/2017    Past Surgical History He has a past surgical history that includes Subdural hematoma evacuation via craniotomy (05/30/1974); Fracture surgery; Hernia repair; and Tonsillectomy.   Medications and Allergies  Current Medications Current Outpatient Medications  Medication Sig Dispense Refill  . amLODIPine  (NORVASC ) 5 MG tablet TAKE 1 TABLET(5 MG) BY MOUTH DAILY 90 tablet 1  . ascorbic acid, vitamin C, (VITAMIN C) 1000 MG tablet Take 1,000 mg by mouth once daily    . aspirin 81 MG EC tablet Take 81 mg by mouth once daily    . blood-glucose meter,continuous (DEXCOM G7 RECEIVER) Misc Use 1 each as directed 1 each 0  . blood-glucose sensor (DEXCOM G7 SENSOR) Devi Use 1 each every 10 (ten) days 9 each 3  . busPIRone (BUSPAR) 5 MG tablet TAKE 1 TABLET(5 MG) BY MOUTH TWICE DAILY 60 tablet 5  . cholecalciferol (VITAMIN D3) 1000 unit capsule Take 1,000 Units by mouth once daily       . colestipoL (COLESTID) 1 gram tablet Take 1 tablet (1 g total) by mouth once daily 90 tablet 1  . cyanocobalamin, vitamin B-12, 1,000 mcg TbER Take by mouth    . divalproex  (DEPAKOTE ) 500 MG DR tablet Take 1 tablet (500 mg total) by mouth 3 (three) times daily 90 tablet 11  .  lisinopriL  (ZESTRIL ) 20 MG tablet Take 1 tablet (20 mg total) by mouth once daily 90 tablet 3  . multivitamin tablet Take 1 tablet by mouth once daily Mens    . omeprazole  (PRILOSEC) 20 MG DR capsule Take 1 capsule (20 mg total) by mouth once daily 90 capsule 3  . sertraline  (ZOLOFT ) 100 MG tablet TAKE 2 TABLETS BY MOUTH DAILY 180 tablet  1  . atorvastatin  (LIPITOR) 10 MG tablet Take 1 tablet (10 mg total) by mouth once daily 90 tablet 3  . JANUVIA  100 mg tablet Take 1 tablet (100 mg total) by mouth once daily 91 tablet 3  . TRESIBA  FLEXTOUCH U-100 pen injector (concentration 100 units/mL) Inject 28 Units subcutaneously once daily (Patient taking differently: Inject 40 Units subcutaneously once daily) 30 mL 3   No current facility-administered medications for this visit.    Allergies Metformin   Social and Family History  Social History  reports that he has been smoking cigarettes. He started smoking about 65 years ago. He has a 32.5 pack-year smoking history. He has never used smokeless tobacco. He reports current alcohol  use. He reports that he does not use drugs.  Family History family history includes Alzheimer's disease in his maternal aunt; Cancer in his mother; Colon cancer in his mother; Deep vein thrombosis (DVT or abnormal blood clot formation) in his father; Diabetes in his father; Diabetes type II in his father; High blood pressure (Hypertension) in his father; Hyperlipidemia (Elevated cholesterol) in his father; Myocardial Infarction (Heart attack) in his father; Myopathy in his father; Prostate cancer in his paternal uncle.   Review of Systems   Review of Systems: The patient denies chest pain, shortness of breath, orthopnea, paroxysmal nocturnal dyspnea, pedal edema, palpitations, heart racing, presyncope, syncope.    Physical Examination   Vitals:BP 124/70   Pulse 73   Ht 165.1 cm (5' 5)   Wt 73.9 kg (163 lb)   SpO2 98%   BMI 27.12 kg/m  Ht:165.1 cm (5' 5) Wt:73.9 kg (163 lb) ADJ:Anib surface area is 1.84 meters squared. Body mass index is 27.12 kg/m.  HEENT: Pupils equally reactive to light and accomodation  Neck: Supple, no significant JVD Lungs: clear to auscultation bilaterally; no wheezes, rales, rhonchi Heart: Regular rate and rhythm. No murmur Extremities: no pedal edema  Assessment and  Plan   78 y.o. male with  Coronary artery disease as noted by elevated CAC cardiac CT 12/2023 Very high CAC score 2232 Nuclear stress test 02/2024 with no ischemia Tobacco abuse Hypertension Type 2 diabetes, hyperlipidemia  Stable from cardiac standpoint without anginal symptoms. Has stable chronic exertional dyspnea likely related to smoking with possible COPD. Recent stress test with no ischemia. Continue low-dose aspirin. Continue atorvastatin  Blood pressure well-controlled and euvolemic on exam. Continue lisinopril  Counseled on smoking cessation  No orders of the defined types were placed in this encounter.   Return in about 6 months (around 09/13/2024).  KRISHNA CHAITANYA ALLURI, MD  This dictation was prepared with dragon dictation. Any transcription errors that result from this process are unintentional.

## 2024-04-16 NOTE — Progress Notes (Signed)
 Subjective:     Patient ID: Dustin Ferrell is a 78 y.o. male.  Injury  : PRESENTS WITH A FISH HOOK EMBEDDED IN HIS LOWER LIP.OCCURRED ABOUT 1L5 HRS AGO. LAST TET WAS 2018 RECORDS REVIEWED.   Past Medical History:  has a past medical history of Adrenal nodule (HHS-HCC) (07/16/2019), Anxiety, Arthritis, B12 deficiency (01/22/2020), Cerebral hemorrhage (CMS/HHS-HCC) (05/30/1974), Cigarette nicotine dependence with nicotine-induced disorder, Colon polyps (07/07/2014), Combined hyperlipidemia associated with type 2 diabetes mellitus (CMS/HHS-HCC) (07/07/2014), Diabetes mellitus due to underlying condition without complication, with long-term current use of insulin  (CMS/HHS-HCC) (04/06/2017), 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CMS-HCC) (07/07/2014), GERD (gastroesophageal reflux disease), Hypertension associated with diabetes (CMS/HHS-HCC) (07/07/2014), Iron deficiency anemia, 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 Past Surgical History:  has a past surgical history that includes Subdural hematoma evacuation via craniotomy (05/30/1974); Fracture surgery; Hernia repair; and Tonsillectomy. Family History: family history includes Alzheimer's disease in his maternal aunt; Cancer in his mother; Colon cancer in his mother; Deep vein thrombosis (DVT or abnormal blood clot formation) in his father; Diabetes in his father; Diabetes type II in his father; High blood pressure (Hypertension) in his father; Hyperlipidemia (Elevated cholesterol) in his father;  Myocardial Infarction (Heart attack) in his father; Myopathy in his father; Prostate cancer in his paternal uncle. Social History:  reports that he has been smoking cigarettes. He started smoking about 65 years ago. He has a 32.6 pack-year smoking history. He has never used smokeless tobacco. He reports current alcohol use. He reports that he does not use drugs. Prior to encounter Medications:  Current Outpatient Medications on File Prior to Visit  Medication Sig Dispense Refill  . amLODIPine  (NORVASC ) 5 MG tablet TAKE 1 TABLET(5 MG) BY MOUTH DAILY 90 tablet 1  . ascorbic acid, vitamin C, (VITAMIN C) 1000 MG tablet Take 1,000 mg by mouth once daily    . aspirin 81 MG EC tablet Take 81 mg by mouth once daily    . atorvastatin  (LIPITOR) 10 MG tablet Take 1 tablet (10 mg total) by mouth once daily 90 tablet 3  . blood-glucose meter,continuous (DEXCOM G7 RECEIVER) Misc Use 1 each as directed 1 each 0  . blood-glucose sensor (DEXCOM G7 SENSOR) Devi Use 1 each every 10 (ten) days 9 each 3  . busPIRone (BUSPAR) 5 MG tablet TAKE 1 TABLET(5 MG) BY MOUTH TWICE DAILY 60 tablet 5  . cholecalciferol (VITAMIN D3) 1000 unit capsule Take 1,000 Units by mouth once daily       . colestipoL (COLESTID) 1 gram tablet Take 1 tablet (1 g total) by mouth once daily 90 tablet 1  . cyanocobalamin, vitamin B-12, 1,000 mcg TbER Take by mouth    . divalproex  (DEPAKOTE ) 500 MG DR tablet TAKE 1 TABLET(500 MG) BY MOUTH THREE TIMES DAILY 270 tablet 1  . JANUVIA  100 mg tablet Take 1 tablet (100 mg total) by mouth once daily 91 tablet 3  . lisinopriL  (ZESTRIL ) 20 MG tablet Take 1 tablet (20 mg total) by mouth once daily 90 tablet 3  . multivitamin tablet Take 1 tablet by mouth once daily Mens    . omeprazole  (PRILOSEC) 20 MG DR capsule Take 1 capsule (20 mg total) by mouth once daily 90 capsule  3  . sertraline (ZOLOFT) 100 MG tablet TAKE 2 TABLETS BY MOUTH DAILY 180 tablet 1  . TRESIBA  FLEXTOUCH U-100 pen injector (concentration  100 units/mL) Inject 34 Units subcutaneously once daily 30 mL 3  . [DISCONTINUED] omeprazole  (PRILOSEC) 20 MG DR capsule TAKE 1 CAPSULE BY MOUTH EVERY EVENING 30 MINUTES PRIOR TO DINNER     No current facility-administered medications on file prior to visit.   Allergies: is allergic to metformin .  This an established patient is here today for: office visit.  Review of Systems     Objective:   Physical Exam Vitals and nursing note reviewed.  Constitutional:      General: He is not in acute distress.    Appearance: Normal appearance. He is not toxic-appearing.  HENT:     Mouth/Throat:     Comments: FISH HOOK EMBEDDED IN LOWER LIP LOCAL WITH 2% XYLOCAINE WITHOUT EPI APPROX 2 CC HOOKE REMOVED BY COUNTER PRESSURE AND DISENGAGING THE BARB.  CAME OUT EASILY WITHOUT ISSUE.   BLEEDING CONTROLED WITH PRESSURE.  Neurological:     Mental Status: He is alert.        Assessment:     1. Fish hook in lip   -     doxycycline (VIBRA-TABS) 100 MG tablet; Take 1 tablet (100 mg total) by mouth 2 (two) times daily for 7 days     Plan:     Patient Instructions  RINSE MOUTH WITH SALT WATER TWICE DAILY  ANTIBIOTIC TWICE DAILY X 5 DAYS MONITOR FOR ANY SIGNS OF INFECTION FOLLOW UP AS NEEDED YOU LAST TETANUS WAS 2018

## 2024-05-03 ENCOUNTER — Inpatient Hospital Stay
Admission: EM | Admit: 2024-05-03 | Discharge: 2024-05-30 | DRG: 082 | Disposition: E | Source: Ambulatory Visit | Attending: Internal Medicine | Admitting: Internal Medicine

## 2024-05-03 ENCOUNTER — Encounter: Payer: Self-pay | Admitting: Intensive Care

## 2024-05-03 ENCOUNTER — Inpatient Hospital Stay

## 2024-05-03 ENCOUNTER — Other Ambulatory Visit: Payer: Self-pay

## 2024-05-03 ENCOUNTER — Emergency Department

## 2024-05-03 DIAGNOSIS — G928 Other toxic encephalopathy: Secondary | ICD-10-CM | POA: Diagnosis present

## 2024-05-03 DIAGNOSIS — G3109 Other frontotemporal dementia: Secondary | ICD-10-CM | POA: Diagnosis present

## 2024-05-03 DIAGNOSIS — I629 Nontraumatic intracranial hemorrhage, unspecified: Principal | ICD-10-CM

## 2024-05-03 DIAGNOSIS — E860 Dehydration: Secondary | ICD-10-CM | POA: Diagnosis not present

## 2024-05-03 DIAGNOSIS — S065XAA Traumatic subdural hemorrhage with loss of consciousness status unknown, initial encounter: Secondary | ICD-10-CM | POA: Diagnosis present

## 2024-05-03 DIAGNOSIS — E871 Hypo-osmolality and hyponatremia: Secondary | ICD-10-CM | POA: Diagnosis present

## 2024-05-03 DIAGNOSIS — Z789 Other specified health status: Secondary | ICD-10-CM | POA: Diagnosis not present

## 2024-05-03 DIAGNOSIS — Z7189 Other specified counseling: Secondary | ICD-10-CM | POA: Diagnosis not present

## 2024-05-03 DIAGNOSIS — I4891 Unspecified atrial fibrillation: Secondary | ICD-10-CM | POA: Diagnosis present

## 2024-05-03 DIAGNOSIS — S066XAA Traumatic subarachnoid hemorrhage with loss of consciousness status unknown, initial encounter: Secondary | ICD-10-CM | POA: Diagnosis present

## 2024-05-03 DIAGNOSIS — G9340 Encephalopathy, unspecified: Secondary | ICD-10-CM | POA: Diagnosis not present

## 2024-05-03 DIAGNOSIS — E1159 Type 2 diabetes mellitus with other circulatory complications: Secondary | ICD-10-CM | POA: Diagnosis present

## 2024-05-03 DIAGNOSIS — E1165 Type 2 diabetes mellitus with hyperglycemia: Secondary | ICD-10-CM | POA: Diagnosis present

## 2024-05-03 DIAGNOSIS — Z515 Encounter for palliative care: Secondary | ICD-10-CM | POA: Diagnosis not present

## 2024-05-03 DIAGNOSIS — I619 Nontraumatic intracerebral hemorrhage, unspecified: Secondary | ICD-10-CM

## 2024-05-03 DIAGNOSIS — E089 Diabetes mellitus due to underlying condition without complications: Secondary | ICD-10-CM

## 2024-05-03 DIAGNOSIS — D72829 Elevated white blood cell count, unspecified: Secondary | ICD-10-CM | POA: Diagnosis present

## 2024-05-03 DIAGNOSIS — Z794 Long term (current) use of insulin: Secondary | ICD-10-CM | POA: Diagnosis not present

## 2024-05-03 DIAGNOSIS — S0634AA Traumatic hemorrhage of right cerebrum with loss of consciousness status unknown, initial encounter: Secondary | ICD-10-CM | POA: Diagnosis present

## 2024-05-03 DIAGNOSIS — F0390 Unspecified dementia without behavioral disturbance: Secondary | ICD-10-CM | POA: Diagnosis present

## 2024-05-03 DIAGNOSIS — R569 Unspecified convulsions: Secondary | ICD-10-CM

## 2024-05-03 DIAGNOSIS — Z87820 Personal history of traumatic brain injury: Secondary | ICD-10-CM | POA: Diagnosis not present

## 2024-05-03 DIAGNOSIS — Z1152 Encounter for screening for COVID-19: Secondary | ICD-10-CM | POA: Diagnosis not present

## 2024-05-03 DIAGNOSIS — G9389 Other specified disorders of brain: Secondary | ICD-10-CM | POA: Diagnosis present

## 2024-05-03 DIAGNOSIS — E870 Hyperosmolality and hypernatremia: Secondary | ICD-10-CM | POA: Diagnosis present

## 2024-05-03 DIAGNOSIS — I152 Hypertension secondary to endocrine disorders: Secondary | ICD-10-CM | POA: Diagnosis present

## 2024-05-03 DIAGNOSIS — G9341 Metabolic encephalopathy: Secondary | ICD-10-CM | POA: Diagnosis not present

## 2024-05-03 DIAGNOSIS — Z66 Do not resuscitate: Secondary | ICD-10-CM | POA: Diagnosis present

## 2024-05-03 DIAGNOSIS — S020XXA Fracture of vault of skull, initial encounter for closed fracture: Secondary | ICD-10-CM

## 2024-05-03 DIAGNOSIS — F0284 Dementia in other diseases classified elsewhere, unspecified severity, with anxiety: Secondary | ICD-10-CM | POA: Diagnosis present

## 2024-05-03 DIAGNOSIS — E874 Mixed disorder of acid-base balance: Secondary | ICD-10-CM | POA: Diagnosis present

## 2024-05-03 DIAGNOSIS — N179 Acute kidney failure, unspecified: Secondary | ICD-10-CM | POA: Diagnosis present

## 2024-05-03 DIAGNOSIS — F02818 Dementia in other diseases classified elsewhere, unspecified severity, with other behavioral disturbance: Secondary | ICD-10-CM | POA: Diagnosis present

## 2024-05-03 DIAGNOSIS — W19XXXA Unspecified fall, initial encounter: Secondary | ICD-10-CM | POA: Diagnosis present

## 2024-05-03 DIAGNOSIS — G40909 Epilepsy, unspecified, not intractable, without status epilepticus: Secondary | ICD-10-CM | POA: Diagnosis present

## 2024-05-03 DIAGNOSIS — E11649 Type 2 diabetes mellitus with hypoglycemia without coma: Secondary | ICD-10-CM | POA: Diagnosis present

## 2024-05-03 LAB — CBC WITH DIFFERENTIAL/PLATELET
Abs Immature Granulocytes: 0.04 K/uL (ref 0.00–0.07)
Basophils Absolute: 0 K/uL (ref 0.0–0.1)
Basophils Relative: 0 %
Eosinophils Absolute: 0 K/uL (ref 0.0–0.5)
Eosinophils Relative: 0 %
HCT: 35.9 % — ABNORMAL LOW (ref 39.0–52.0)
Hemoglobin: 12.2 g/dL — ABNORMAL LOW (ref 13.0–17.0)
Immature Granulocytes: 1 %
Lymphocytes Relative: 12 %
Lymphs Abs: 1 K/uL (ref 0.7–4.0)
MCH: 30.7 pg (ref 26.0–34.0)
MCHC: 34 g/dL (ref 30.0–36.0)
MCV: 90.2 fL (ref 80.0–100.0)
Monocytes Absolute: 1 K/uL (ref 0.1–1.0)
Monocytes Relative: 12 %
Neutro Abs: 6.6 K/uL (ref 1.7–7.7)
Neutrophils Relative %: 75 %
Platelets: 177 K/uL (ref 150–400)
RBC: 3.98 MIL/uL — ABNORMAL LOW (ref 4.22–5.81)
RDW: 14.3 % (ref 11.5–15.5)
WBC: 8.8 K/uL (ref 4.0–10.5)
nRBC: 0 % (ref 0.0–0.2)

## 2024-05-03 LAB — TROPONIN T, HIGH SENSITIVITY
Troponin T High Sensitivity: 57 ng/L — ABNORMAL HIGH (ref 0–19)
Troponin T High Sensitivity: 52 ng/L — ABNORMAL HIGH (ref 0–19)

## 2024-05-03 LAB — COMPREHENSIVE METABOLIC PANEL WITH GFR
ALT: 23 U/L (ref 0–44)
AST: 23 U/L (ref 15–41)
Albumin: 4.3 g/dL (ref 3.5–5.0)
Alkaline Phosphatase: 61 U/L (ref 38–126)
Anion gap: 14 (ref 5–15)
BUN: 30 mg/dL — ABNORMAL HIGH (ref 8–23)
CO2: 24 mmol/L (ref 22–32)
Calcium: 9.6 mg/dL (ref 8.9–10.3)
Chloride: 97 mmol/L — ABNORMAL LOW (ref 98–111)
Creatinine, Ser: 0.87 mg/dL (ref 0.61–1.24)
GFR, Estimated: >60 mL/min (ref >60)
Glucose, Bld: 181 mg/dL — ABNORMAL HIGH (ref 70–99)
Potassium: 4.3 mmol/L (ref 3.5–5.1)
Sodium: 135 mmol/L (ref 135–145)
Total Bilirubin: 0.6 mg/dL (ref 0.0–1.2)
Total Protein: 7.2 g/dL (ref 6.5–8.1)

## 2024-05-03 LAB — URINALYSIS, COMPLETE (UACMP) WITH MICROSCOPIC
Bacteria, UA: NONE SEEN
Bilirubin Urine: NEGATIVE
Glucose, UA: 50 mg/dL — AB
Hgb urine dipstick: NEGATIVE
Ketones, ur: 5 mg/dL — AB
Leukocytes,Ua: NEGATIVE
Nitrite: NEGATIVE
Protein, ur: 30 mg/dL — AB
Specific Gravity, Urine: 1.019 (ref 1.005–1.030)
Squamous Epithelial / HPF: 0 /HPF (ref 0–5)
pH: 5 (ref 5.0–8.0)

## 2024-05-03 LAB — CBG MONITORING, ED: Glucose-Capillary: 129 mg/dL — ABNORMAL HIGH (ref 70–99)

## 2024-05-03 LAB — MAGNESIUM: Magnesium: 1.9 mg/dL (ref 1.7–2.4)

## 2024-05-03 LAB — VALPROIC ACID LEVEL: Valproic Acid Lvl: 68 ug/mL (ref 50–100)

## 2024-05-03 LAB — SODIUM: Sodium: 134 mmol/L — ABNORMAL LOW (ref 135–145)

## 2024-05-03 MED ORDER — ACETAMINOPHEN 325 MG RE SUPP: 650.0000 mg | Freq: Four times a day (QID) | RECTAL | Status: DC | PRN

## 2024-05-03 MED ORDER — INSULIN ASPART 100 UNIT/ML IJ SOLN
0.0000 [IU] | Freq: Four times a day (QID) | INTRAMUSCULAR | Status: DC
  Administered 2024-05-04: 7 [IU] via SUBCUTANEOUS
  Administered 2024-05-05: 2 [IU] via SUBCUTANEOUS
  Administered 2024-05-05: 3 [IU] via SUBCUTANEOUS
  Administered 2024-05-05: 2 [IU] via SUBCUTANEOUS
  Administered 2024-05-06 (×3): 3 [IU] via SUBCUTANEOUS
  Administered 2024-05-07: 5 [IU] via SUBCUTANEOUS
  Administered 2024-05-07: 9 [IU] via SUBCUTANEOUS
  Administered 2024-05-07 – 2024-05-08 (×3): 2 [IU] via SUBCUTANEOUS
  Administered 2024-05-08 (×2): 5 [IU] via SUBCUTANEOUS
  Administered 2024-05-08: 2 [IU] via SUBCUTANEOUS
  Administered 2024-05-09 (×2): 3 [IU] via SUBCUTANEOUS
  Administered 2024-05-09: 2 [IU] via SUBCUTANEOUS
  Administered 2024-05-09 – 2024-05-10 (×2): 3 [IU] via SUBCUTANEOUS
  Administered 2024-05-10: 5 [IU] via SUBCUTANEOUS
  Filled 2024-05-03 (×2): qty 2
  Filled 2024-05-03: qty 3
  Filled 2024-05-03 (×2): qty 2
  Filled 2024-05-03 (×2): qty 3
  Filled 2024-05-03: qty 5
  Filled 2024-05-03 (×2): qty 7
  Filled 2024-05-03 (×2): qty 3
  Filled 2024-05-03: qty 9
  Filled 2024-05-03: qty 2
  Filled 2024-05-03: qty 3
  Filled 2024-05-03 (×2): qty 5
  Filled 2024-05-03: qty 2
  Filled 2024-05-03: qty 5
  Filled 2024-05-03 (×2): qty 3
  Filled 2024-05-03: qty 2

## 2024-05-03 MED ORDER — NIMODIPINE 30 MG PO CAPS
30.0000 mg | ORAL_CAPSULE | ORAL | Status: DC
  Administered 2024-05-03 – 2024-05-04 (×5): 30 mg via ORAL
  Filled 2024-05-03 (×7): qty 1

## 2024-05-03 MED ORDER — SODIUM CHLORIDE 0.9% FLUSH
3.0000 mL | Freq: Two times a day (BID) | INTRAVENOUS | Status: DC
  Administered 2024-05-03 – 2024-05-08 (×6): 3 mL via INTRAVENOUS

## 2024-05-03 MED ORDER — LACTATED RINGERS IV SOLN: INTRAVENOUS | Status: DC

## 2024-05-03 MED ORDER — NIMODIPINE 30 MG PO CAPS
30.0000 mg | ORAL_CAPSULE | ORAL | Status: DC
  Filled 2024-05-03 (×4): qty 1

## 2024-05-03 MED ORDER — SENNOSIDES-DOCUSATE SODIUM 8.6-50 MG PO TABS: 1.0000 | ORAL_TABLET | Freq: Every evening | ORAL | Status: DC | PRN

## 2024-05-03 MED ORDER — ACETAMINOPHEN 160 MG/5ML PO SOLN: 650.0000 mg | ORAL | Status: DC | PRN

## 2024-05-03 MED ORDER — ACETAMINOPHEN 325 MG PO TABS: 650.0000 mg | ORAL_TABLET | Freq: Four times a day (QID) | ORAL | Status: DC | PRN

## 2024-05-03 MED ORDER — VALPROATE SODIUM 100 MG/ML IV SOLN
500.0000 mg | Freq: Four times a day (QID) | INTRAVENOUS | Status: DC
  Administered 2024-05-03 – 2024-05-04 (×3): 500 mg via INTRAVENOUS
  Filled 2024-05-03 (×4): qty 5

## 2024-05-03 MED ORDER — ACETAMINOPHEN 325 MG PO TABS
650.0000 mg | ORAL_TABLET | ORAL | Status: DC | PRN
  Administered 2024-05-05 – 2024-05-06 (×2): 650 mg via ORAL
  Filled 2024-05-03 (×2): qty 2

## 2024-05-03 MED ORDER — STROKE: EARLY STAGES OF RECOVERY BOOK: Freq: Once | Status: AC

## 2024-05-03 MED ORDER — PANTOPRAZOLE SODIUM 40 MG IV SOLR
40.0000 mg | Freq: Every day | INTRAVENOUS | Status: DC
  Administered 2024-05-03 – 2024-05-04 (×2): 40 mg via INTRAVENOUS
  Filled 2024-05-03 (×2): qty 10

## 2024-05-03 MED ORDER — ACETAMINOPHEN 650 MG RE SUPP
650.0000 mg | RECTAL | Status: DC | PRN
  Administered 2024-05-13 – 2024-05-14 (×2): 650 mg via RECTAL
  Filled 2024-05-03 (×2): qty 1

## 2024-05-03 MED ORDER — SODIUM CHLORIDE 0.9 % IV SOLN: INTRAVENOUS | Status: AC

## 2024-05-03 MED ORDER — SODIUM CHLORIDE 0.9 % IV SOLN: INTRAVENOUS | Status: DC

## 2024-05-03 MED ORDER — LABETALOL HCL 5 MG/ML IV SOLN
10.0000 mg | INTRAVENOUS | Status: DC | PRN
  Administered 2024-05-03 – 2024-05-04 (×3): 10 mg via INTRAVENOUS
  Filled 2024-05-03 (×4): qty 4

## 2024-05-03 NOTE — Progress Notes (Signed)
 Patient presents to triage with possible fall at some point recently, facial bruising, altered mental status per wife.  History of dementia, TBI, diabetes, hypertension seizures, ICH.  Transferring to the emergency department to rule out acute intracranial injury, significant facial injury.

## 2024-05-03 NOTE — ED Notes (Signed)
 Pt to ED with wife and daughter. Pt was sleeping extra on Wednesday and seemed extra disoriented per wife (hx dementia). Yesterday wife noticed facial bruising. Pt denies trauma/falls. Has not been eating much since 2 days. Wife noticed vomit on pt's shirt on Wednesday. Pt has been sleeping a lot per wife.

## 2024-05-03 NOTE — ED Provider Notes (Signed)
 [facility] Provider Note    Event Date/Time   First MD Initiated Contact with Patient 05/03/24 1232     (approximate)   History   Fall and Emesis   HPI  Dustin Ferrell is a 78 year old male with history of CAD, neurodegenerative disorder presenting to the emergency department for evaluation of confusion.  Accompanied by wife who provides collateral history.  She notes that patient has not been feeling well since Wednesday, on Thursday had an episode of vomiting.  Yesterday they noticed bruising over his face.  They were not aware of any trauma, but patient states by himself at times so unsure if he had unwitnessed trauma.  Patient does not remember falling.  They report that at baseline patient does not have significant confusion, primarily has behavioral disturbances.  I reviewed his neurology visit from 02/14/2024.  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     Physical Exam   Triage Vital Signs: ED Triage Vitals  Encounter Vitals Group     BP 05/03/24 1230 (!) 116/94     Girls Systolic BP Percentile --      Girls Diastolic BP Percentile --      Boys Systolic BP Percentile --      Boys Diastolic BP Percentile --      Pulse Rate 05/03/24 1230 70     Resp 05/03/24 1230 18     Temp 05/03/24 1230 97.8 F (36.6 C)     Temp Source 05/03/24 1230 Oral     SpO2 05/03/24 1230 100 %     Weight 05/03/24 1228 165 lb (74.8 kg)     Height 05/03/24 1228 5' 5 (1.651 m)     Head Circumference --      Peak Flow --      Pain Score 05/03/24 1228 4     Pain Loc --      Pain Education --      Exclude from Growth Chart --     Most recent vital signs: Vitals:   05/03/24 1300 05/03/24 1315  BP: (!) 149/69   Pulse: 67 66  Resp: (!) 22 15  Temp:     SpO2: 97% 100%    Nursing notes and vital signs reviewed.  General: Adult male, sitting upright in bed, awake and interactive Head: Hematoma over the anterior forehead with bruising around the bilateral orbits, no palpable deformity Chest: Symmetric chest rise, no tenderness to palpation.  Cardiac: Regular rhythm and rate.  Respiratory: Lungs clear to auscultation Abdomen: Soft, nondistended. No tenderness to palpation.  Pelvis: Stable in AP and lateral compression. No tenderness to palpation. MSK: No deformity to bilateral upper and lower extremity. Full range of motion to bilateral upper lower extremity. Neuro: Alert, oriented. GCS 15. 5 out of 5 strength in bilateral upper and lower extremities. Normal sensation to light touch in bilateral upper and lower extremity. Skin: No evidence of burns or lacerations.  ED Results / Procedures / Treatments   Labs (all labs ordered are listed, but only abnormal results are displayed) Labs Reviewed  CBC WITH DIFFERENTIAL/PLATELET - Abnormal; Notable for the following components:      Result Value   RBC 3.98 (*)    Hemoglobin 12.2 (*)    HCT 35.9 (*)    All other  components within normal limits  COMPREHENSIVE METABOLIC PANEL WITH GFR - Abnormal; Notable for the following components:   Chloride 97 (*)    Glucose, Bld 181 (*)    BUN 30 (*)    All other components within normal limits  TROPONIN T, HIGH SENSITIVITY - Abnormal; Notable for the following components:   Troponin T High Sensitivity 57 (*)    All other components within normal limits  TROPONIN T, HIGH SENSITIVITY - Abnormal; Notable for the following components:   Troponin T High Sensitivity 52 (*)    All other components within normal limits  URINALYSIS, COMPLETE (UACMP) WITH MICROSCOPIC     EKG EKG independently reviewed and interpreted by myself demonstrates:  EKG demonstrates normal sinus rhythm at rate 73, PR 154, QRS 104, QTc 436, prominent T waves over anterior leads  noted in 2016 but does appear more prominent today, no STEMI  RADIOLOGY Imaging independently reviewed and interpreted by myself demonstrates:  CXR without  focal consolidation CT C-spine without acute fracture CT head concerning for intracranial bleed on my review, discussed with radiologist suspect the patient has developed a traumatic bleed surrounding prior encephalomalacia from distal TBI with possible extension of prior skull fracture without depression  Formal Radiology Read:  CT Head Wo Contrast Result Date: 05/03/2024 EXAM: CT HEAD WITHOUT CONTRAST 05/03/2024 01:46:12 PM TECHNIQUE: CT of the head was performed without the administration of intravenous contrast. Automated exposure control, iterative reconstruction, and/or weight based adjustment of the mA/kV was utilized to reduce the radiation dose to as low as reasonably achievable. COMPARISON: MRI brain 08/17/2023 and CT head 09/30/2014. CLINICAL HISTORY: Head trauma, minor (Age >= 65y). FINDINGS: BRAIN AND VENTRICLES: Extensive hemorrhagic contusions involving the right greater than left bilateral anterior frontal lobes with adjacent foci of sulcal subarachnoid hemorrhage. There is adjacent parenchymal hypoattenuation likely representing edema. There is a more confluent 4.2 x 2.6 x 1.3 cm (AP x TR CC) extraaxial hemorrhage along the planum sphenoidale and within the right olfactory groove. There is preexisting encephalomalacia and gliosis involving the right frontal lobe. There are additional foci of hemorrhagic contusion and subarachnoid hemorrhage involving the inferior left temporal lobe/ left middle cranial fossa. There is a thin significant right temporal convexity subdural hemorrhage . No acute territorial infarction. No midline shift. There are mild layering hemorrhages within the bilateral occipital horns. No hydrocephalus. ORBITS: Post surgical fixation to the right superior orbit. Chronic right inferior orbital wall fracture. SINUSES:  There is scattered opacification of the bilateral ethmoid air cells. Post surgical changes to the right frontal sinus. Chronic right anterior lamina papyracea deformity. Chronic bony nasal septal deformity. SOFT TISSUES AND SKULL: Evidence of prior craniofacial trauma. Post surgical fixation to the right superior orbit and frontal bone. Chronic asymmetry and deformity of the right cribriform plate. There is suspicion for increased posterior propagation of a right nondisplaced linear skull fracture into the parietal bone which may represent superimposed acute skull fracture. No acute displaced or depressed calvarial fracture. Trace left mastoid effusion. No acute soft tissue abnormality. IMPRESSION: 1. Hemorrhagic contusions involving the right greater than left anterior frontal lobes with adjacent sulcal subarachnoid hemorrhage and associated edema. 2. More confluent approximately 4 cm extraaxial hemorrhage along the planum sphenoidale and within the olfactory groove. 3. Mild layering intraventricular hemorrhage within the bilateral occipital horns also likely posttraumatic. 4. Suspicion for acute nondisplaced right parietal skull fracture, extending more posteriorly since 2016. 5. No acute facial bone fracture. 6. These findings were communicated to Dr. Levander by  telephone at 2:38 PM. Electronically signed by: prentice bybordi 05/03/2024 03:40 PM EST RP Workstation: [id number]   CT Maxillofacial Wo Contrast Result Date: 05/03/2024 EXAM: CT HEAD WITHOUT CONTRAST 05/03/2024 01:46:12 PM TECHNIQUE: CT of the head was performed without the administration of intravenous contrast. Automated exposure control, iterative reconstruction, and/or weight based adjustment of the mA/kV was utilized to reduce the radiation dose to as low as reasonably achievable. COMPARISON: MRI brain 08/17/2023 and CT head 09/30/2014. CLINICAL HISTORY: Head trauma, minor (Age >= 65y). FINDINGS: BRAIN AND VENTRICLES: Extensive hemorrhagic contusions  involving the right greater than left bilateral anterior frontal lobes with adjacent foci of sulcal subarachnoid hemorrhage. There is adjacent parenchymal hypoattenuation likely representing edema. There is a more confluent 4.2 x 2.6 x 1.3 cm (AP x TR CC) extraaxial hemorrhage along the planum sphenoidale and within the right olfactory groove. There is preexisting encephalomalacia and gliosis involving the right frontal lobe. There are additional foci of hemorrhagic contusion and subarachnoid hemorrhage involving the inferior left temporal lobe/ left middle cranial fossa. There is a thin significant right temporal convexity subdural hemorrhage . No acute territorial infarction. No midline shift. There are mild layering hemorrhages within the bilateral occipital horns. No hydrocephalus. ORBITS: Post surgical fixation to the right superior orbit. Chronic right inferior orbital wall fracture. SINUSES: There is scattered opacification of the bilateral ethmoid air cells. Post surgical changes to the right frontal sinus. Chronic right anterior lamina papyracea deformity. Chronic bony nasal septal deformity. SOFT TISSUES AND SKULL: Evidence of prior craniofacial trauma. Post surgical fixation to the right superior orbit and frontal bone. Chronic asymmetry and deformity of the right cribriform plate. There is suspicion for increased posterior propagation of a right nondisplaced linear skull fracture into the parietal bone which may represent superimposed acute skull fracture. No acute displaced or depressed calvarial fracture. Trace left mastoid effusion. No acute soft tissue abnormality. IMPRESSION: 1. Hemorrhagic contusions involving the right greater than left anterior frontal lobes with adjacent sulcal subarachnoid hemorrhage and associated edema. 2. More confluent approximately 4 cm extraaxial hemorrhage along the planum sphenoidale and within the olfactory groove. 3. Mild layering intraventricular hemorrhage within  the bilateral occipital horns also likely posttraumatic. 4. Suspicion for acute nondisplaced right parietal skull fracture, extending more posteriorly since 2016. 5. No acute facial bone fracture. 6. These findings were communicated to Dr. Levander by telephone at 2:38 PM. Electronically signed by: prentice bybordi 05/03/2024 03:40 PM EST RP Workstation: [id number]   CT Cervical Spine Wo Contrast Result Date: 05/03/2024 EXAM: CT CERVICAL SPINE WITHOUT CONTRAST 05/03/2024 01:46:12 PM TECHNIQUE: CT of the cervical spine was performed without the administration of intravenous contrast. Multiplanar reformatted images are provided for review. Automated exposure control, iterative reconstruction, and/or weight based adjustment of the mA/kV was utilized to reduce the radiation dose to as low as reasonably achievable. COMPARISON: None available. CLINICAL HISTORY: Neck trauma (Age >= 65y) FINDINGS: CERVICAL SPINE: BONES AND ALIGNMENT: No acute fracture or traumatic malalignment. 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 DEGENERATIVE CHANGES: There is multilevel intervertebral disc height loss most pronounced at C6-C7. There is fusion of the C3-C4 facet joints on the left. No high-grade spinal canal stenosis within the limitations of CT. There is multilevel uncovertebral spurring and facet arthropathy with varying degrees of neural foraminal stenosis bilaterally. SOFT TISSUES: No prevertebral soft tissue swelling. Bilateral maxillary sinus retention cysts versus polyps. There are aerated secretions within the piriform sinuses. IMPRESSION: 1. No acute fracture or traumatic malalignment of the cervical spine.  Electronically signed by: prentice bybordi 05/03/2024 02:30 PM EST RP Workstation: [id number]   DG Chest Portable 1 View Result Date: 05/03/2024 EXAM: 1 VIEW(S) XRAY OF THE CHEST 05/03/2024 01:31:00 PM COMPARISON: 04/28/2017 bilateral rib fractures are again noted. CLINICAL HISTORY: chest pain FINDINGS: LUNGS  AND PLEURA: No focal pulmonary opacity. No pleural effusion. No pneumothorax. HEART AND MEDIASTINUM: No acute abnormality of the cardiac and mediastinal silhouettes. BONES AND SOFT TISSUES: Bilateral rib fractures are again noted. IMPRESSION: 1. No acute cardiopulmonary process. Chronic bilateral rib fractures, unchanged from 04/28/2017. Electronically signed by: Lynwood Seip MD 05/03/2024 01:54 PM EST RP Workstation: [id number]    PROCEDURES:  Critical Care performed: No  Procedures   MEDICATIONS ORDERED IN ED: Medications - No data to display   IMPRESSION / MDM / ASSESSMENT AND PLAN / ED COURSE  I reviewed the triage vital signs and the nursing notes.  Differential diagnosis includes, but is not limited to, intracranial bleed, skull fracture, facial fracture, spine fracture, no evidence of thoracoabdominal trauma, ACS, arrhythmia, anemia, electrolyte abnormality  Patient's presentation is most consistent with acute presentation with potential threat to life or bodily function.  78 year old male with history of distant TBI presenting to the emergency department with facial bruising and confusion.  High suspicion for recent trauma.  Confused on exam but alert and oriented to situation though he does not recall having any sort of facial trauma.  Labs with mild anemia with hemoglobin of 12.2, normal white blood cell count, CMP without significant derangement.  Troponin slightly elevated at 57, no recent prior system for comparison.  Wife reports patient recently had an elevated coronary calcium  score, but has no known cardiac issues.  Head CT demonstrates hemorrhagic contusions of the right frontal lobe with adjacent subarachnoid as well as extra-axial hemorrhage at the site of prior encephalomalacia.  Neurosurgery paged to discuss further.  Family updated on results of CT scan.   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 Has bruisning in face. Dont know how, but likely fall. He lives at home with family. No deficits. Elevated troponin.   [HD]  1550 Case reviewed with Dr. Katrina with neurosurgery.  He will review the patient's imaging and provide recommendations.  Signed out to Dr. Nicholaus pending neurosurgery recommendations and disposition. [NR]    Clinical Course User Index [HD] Nicholaus Rolland BRAVO, MD [NR] Levander Slate, MD     FINAL CLINICAL IMPRESSION(S) / ED DIAGNOSES   Final diagnoses:  None     Rx / DC Orders   ED Discharge Orders     None        Note:  This document was prepared using Dragon voice recognition software and may include unintentional dictation errors.   Levander Slate, MD 05/03/24 [phone]

## 2024-05-03 NOTE — ED Triage Notes (Signed)
 Brought from [facility]. Patient has unknown facial trauma. Not feeling well Wednesday and then wife noticed emesis on him that evening. Bruising on face showed up yesterday. Wife reports patient stays by himself sometimes so he could have fell without her knowing. Patient is unsure if he fell or not.  Denies taking blood thinners  History TBI and dementia

## 2024-05-03 NOTE — ED Notes (Signed)
EDP at bedside evaluating pt.  

## 2024-05-03 NOTE — H&P (Signed)
 History and Physical    Dustin Ferrell FMW:[id number] DOB: 07/01/1945 DOA: 05/03/2024  DOS: the patient was seen and examined on 05/03/2024  PCP: [name], Dustin Ferrell   Patient coming from: Home  I have personally briefly reviewed patient's old medical records in [facility] and CareEverywhere  HPI:   Dustin Ferrell is a 78 y.o. 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  Per family patient had URI symptoms and had significant vomiting on Wednesday.  He has been having coughing   Pt denies any acute complaints.  He is able to tell me his name, location and current time and why he is in the hospital.  He denies any acute complaints but does say that he has been having some coughing and he has some chest pain when he coughs.  He denies any headaches, fever or chills.  He denies any trauma but family does report a poor memory.  Wife states since his brain injury and his 34s.  He has been independent in his ADLs but has poor judgment and needs help with executive tasks.  He is more withdrawn which has been the case for him.  At times he can be impulsive which has been the case since his injury.   On arrival to the ED patient was noted to be HDS stable.  Lab work and imaging obtained.  CBC without leukocytosis, mild anemia but appears to be at his baseline.  CMP overall unremarkable except for mild BUN elevation and moderate hyperglycemia. Troponin mildly elevated but downtrending.  Head imaging was obtained which showed multiple sites of hemorrhage including hemorrhagic contusions involving the right and left anterior frontal lobes with adjacent sulcal subarachnoid hemorrhage with associated edema.  Other sites include hemorrhage along the planum sphenoid will and within the olfactory groove  and also intraventricular hemorrhage within the occipital horns.  CT maxillofacial showed right parietal skull fracture that appears acute.  The case was discussed with Dr. Katrina and they are recommending non operative management given high risk with operative management.  Given this, [facility] contacted for admission.  Review of Systems: As mentioned in the history of present illness. All other systems reviewed and are negative.   Past Medical History:  Diagnosis Date   Anxiety    associated with frontal temporal behavioral dementia   Basal cell carcinoma 06/03/2021   Left nasal ala - Tx with radiation.   Dementia with behavioral disturbance (HCC)    Diabetes mellitus without complication (HCC)    History of traumatic head injury    Hypertension     Past Surgical History:  Procedure Laterality Date   Eye Socket fracture repair Right    HERNIA REPAIR     ROTATOR CUFF REPAIR Left    TONSILLECTOMY     WRIST FRACTURE SURGERY Right      No Known Allergies  Family History  Problem Relation Age of Onset   Stomach cancer Mother    Heart attack Father    Diabetes Father    Colon cancer Neg Hx     Prior to Admission medications   Medication Sig Start Date End Date Taking? Authorizing Provider  amLODipine  (NORVASC ) 5 MG tablet Take 1 tablet (5 mg total) by mouth daily. (if BP not at goal) 08/13/19 04/11/22  Alvan Dorothyann BIRCH, Dustin Ferrell  aspirin  EC 81 MG tablet Take 81 mg by mouth daily.    Provider, Historical, Dustin Ferrell  atorvastatin  (LIPITOR) 10 MG tablet TAKE 1 TABLET BY MOUTH EVERY DAY 07/24/19   Alvan Dorothyann BIRCH, Dustin Ferrell  divalproex  (DEPAKOTE  ER) 500 MG 24 hr tablet Take 1 tablet (500 mg total) by mouth 2 (two) times daily. Patient taking differently: Take 1,500 mg by mouth 2 (two) times daily. 08/13/19   Alvan Dorothyann BIRCH, Dustin Ferrell  ferrous sulfate  325 (65 FE) MG EC tablet Take 1 tablet (325 mg total) by mouth 2 (two) times daily with a meal. 07/16/19   Chasse, Barnie, DO  glucose blood  test strip Use to check blood sugars every morning fasting and 2 hours after largest meal 05/27/19   Danford, Katy D, NP  insulin  degludec (TRESIBA  FLEXTOUCH) 100 UNIT/ML SOPN FlexTouch Pen Inject 0.1 mLs (10 Units total) into the skin daily. Patient taking differently: Inject 10 Units into the skin daily. 23 units QD 06/20/19   Danford, Rockie D, NP  Lancets (ACCU-CHEK SOFT TOUCH) lancets Use to check blood sugars every morning fasting and 2 hours after largest meal 05/27/19   Danford, Katy D, NP  omeprazole  (PRILOSEC) 20 MG capsule TAKE 1 CAPSULE BY MOUTH EVERY EVENING 30 MINUTES PRIOR TO DINNER 02/11/20   Nandigam, Kavitha V, Dustin Ferrell  sertraline (ZOLOFT) 100 MG tablet Take 200 mg daily by mouth. 07/23/15   Provider, Historical, Dustin Ferrell  sitaGLIPtin  (JANUVIA ) 100 MG tablet TAKE 1 TABLET (100 MG TOTAL) DAILY BY MOUTH. 08/13/19   Alvan Dorothyann BIRCH, Dustin Ferrell    Social History:  reports that he has been smoking cigarettes. He has a 60 pack-year smoking history. He has never used smokeless tobacco. He reports current alcohol use of about 1.0 standard drink of alcohol per week. He reports that he does not use drugs. Lives with wife Tobacco-smokes 1 pack/day EtOH- Denies use.  Illicit drug use- denies use.  IADLs/ADLs-needs assistance with some ADLs and IADLs.  Physical Exam: Vitals:   05/03/24 1600 05/03/24 1700 05/03/24 1730 05/03/24 1735  BP: (!) 153/84 (!) 144/73 (!) 161/69   Pulse: 76 71 71   Resp: 19 13 16    Temp:    98.3 F (36.8 C)  TempSrc:    Oral  SpO2: 100% 97% 96%   Weight:      Height:         Gen: NAD HENT: Slight bruising around the base of the nose near the ethmoid sinus, no TTP in that area CV: Regular rate and rhythm, good pulses Resp: CTAB Abd: No TTP MSK: No symmetry, slightly decreased muscle bulk Neuro: Alert and oriented x 4, CN II-XII intact, good strength in the upper and lower extremities and good sensation in the upper and lower extremities Psych: Pleasant mood   Labs  on Admission: I have personally reviewed following labs and imaging studies  CBC: Recent Labs  Lab 05/03/24 1257  WBC 8.8  NEUTROABS 6.6  HGB 12.2*  HCT 35.9*  MCV 90.2  PLT 177   Basic Metabolic Panel: Recent Labs  Lab 05/03/24 1257  NA 135  K 4.3  CL 97*  CO2 24  GLUCOSE 181*  BUN 30*  CREATININE 0.87  CALCIUM  9.6   GFR: Estimated Creatinine Clearance: 64 mL/min (by C-G formula based on SCr of 0.87 mg/dL). Liver Function Tests: Recent Labs  Lab 05/03/24 1257  AST 23  ALT 23  ALKPHOS 61  BILITOT 0.6  PROT 7.2  ALBUMIN 4.3   No results  for input(s): LIPASE, AMYLASE in the last 168 hours. No results for input(s): AMMONIA in the last 168 hours. Coagulation Profile: No results for input(s): INR, PROTIME in the last 168 hours. Cardiac Enzymes: No results for input(s): CKTOTAL, CKMB, CKMBINDEX, TROPONINI, TROPONINIHS in the last 168 hours. BNP (last 3 results) No results for input(s): BNP in the last 8760 hours. HbA1C: No results for input(s): HGBA1C in the last 72 hours. CBG: No results for input(s): GLUCAP in the last 168 hours. Lipid Profile: No results for input(s): CHOL, HDL, LDLCALC, TRIG, CHOLHDL, LDLDIRECT in the last 72 hours. Thyroid Function Tests: No results for input(s): TSH, T4TOTAL, FREET4, T3FREE, THYROIDAB in the last 72 hours. Anemia Panel: No results for input(s): VITAMINB12, FOLATE, FERRITIN, TIBC, IRON, RETICCTPCT in the last 72 hours. Urine analysis:    Component Value Date/Time   COLORURINE YELLOW (A) 05/03/2024 1649   APPEARANCEUR CLEAR (A) 05/03/2024 1649   LABSPEC 1.019 05/03/2024 1649   PHURINE 5.0 05/03/2024 1649   GLUCOSEU 50 (A) 05/03/2024 1649   HGBUR NEGATIVE 05/03/2024 1649   BILIRUBINUR NEGATIVE 05/03/2024 1649   KETONESUR 5 (A) 05/03/2024 1649   PROTEINUR 30 (A) 05/03/2024 1649   UROBILINOGEN 0.2 09/30/2014 2227   NITRITE NEGATIVE 05/03/2024 1649    LEUKOCYTESUR NEGATIVE 05/03/2024 1649    Radiological Exams on Admission: I have personally reviewed images CT Head Wo Contrast Result Date: 05/03/2024 EXAM: CT HEAD WITHOUT CONTRAST 05/03/2024 01:46:12 PM TECHNIQUE: CT of the head was performed without the administration of intravenous contrast. Automated exposure control, iterative reconstruction, and/or weight based adjustment of the mA/kV was utilized to reduce the radiation dose to as low as reasonably achievable. COMPARISON: MRI brain 08/17/2023 and CT head 09/30/2014. CLINICAL HISTORY: Head trauma, minor (Age >= 65y). FINDINGS: BRAIN AND VENTRICLES: Extensive hemorrhagic contusions involving the right greater than left bilateral anterior frontal lobes with adjacent foci of sulcal subarachnoid hemorrhage. There is adjacent parenchymal hypoattenuation likely representing edema. There is a more confluent 4.2 x 2.6 x 1.3 cm (AP x TR CC) extraaxial hemorrhage along the planum sphenoidale and within the right olfactory groove. There is preexisting encephalomalacia and gliosis involving the right frontal lobe. There are additional foci of hemorrhagic contusion and subarachnoid hemorrhage involving the inferior left temporal lobe/ left middle cranial fossa. There is a thin significant right temporal convexity subdural hemorrhage . No acute territorial infarction. No midline shift. There are mild layering hemorrhages within the bilateral occipital horns. No hydrocephalus. ORBITS: Post surgical fixation to the right superior orbit. Chronic right inferior orbital wall fracture. SINUSES: There is scattered opacification of the bilateral ethmoid air cells. Post surgical changes to the right frontal sinus. Chronic right anterior lamina papyracea deformity. Chronic bony nasal septal deformity. SOFT TISSUES AND SKULL: Evidence of prior craniofacial trauma. Post surgical fixation to the right superior orbit and frontal bone. Chronic asymmetry and deformity of the right  cribriform plate. There is suspicion for increased posterior propagation of a right nondisplaced linear skull fracture into the parietal bone which may represent superimposed acute skull fracture. No acute displaced or depressed calvarial fracture. Trace left mastoid effusion. No acute soft tissue abnormality. IMPRESSION: 1. Hemorrhagic contusions involving the right greater than left anterior frontal lobes with adjacent sulcal subarachnoid hemorrhage and associated edema. 2. More confluent approximately 4 cm extraaxial hemorrhage along the planum sphenoidale and within the olfactory groove. 3. Mild layering intraventricular hemorrhage within the bilateral occipital horns also likely posttraumatic. 4. Suspicion for acute nondisplaced right parietal skull fracture, extending more  posteriorly since 2016. 5. No acute facial bone fracture. 6. These findings were communicated to Dr. Levander by telephone at 2:38 PM. Electronically signed by: prentice bybordi 05/03/2024 03:40 PM EST RP Workstation: [id number]   CT Maxillofacial Wo Contrast Result Date: 05/03/2024 EXAM: CT HEAD WITHOUT CONTRAST 05/03/2024 01:46:12 PM TECHNIQUE: CT of the head was performed without the administration of intravenous contrast. Automated exposure control, iterative reconstruction, and/or weight based adjustment of the mA/kV was utilized to reduce the radiation dose to as low as reasonably achievable. COMPARISON: MRI brain 08/17/2023 and CT head 09/30/2014. CLINICAL HISTORY: Head trauma, minor (Age >= 65y). FINDINGS: BRAIN AND VENTRICLES: Extensive hemorrhagic contusions involving the right greater than left bilateral anterior frontal lobes with adjacent foci of sulcal subarachnoid hemorrhage. There is adjacent parenchymal hypoattenuation likely representing edema. There is a more confluent 4.2 x 2.6 x 1.3 cm (AP x TR CC) extraaxial hemorrhage along the planum sphenoidale and within the right olfactory groove. There is preexisting encephalomalacia  and gliosis involving the right frontal lobe. There are additional foci of hemorrhagic contusion and subarachnoid hemorrhage involving the inferior left temporal lobe/ left middle cranial fossa. There is a thin significant right temporal convexity subdural hemorrhage . No acute territorial infarction. No midline shift. There are mild layering hemorrhages within the bilateral occipital horns. No hydrocephalus. ORBITS: Post surgical fixation to the right superior orbit. Chronic right inferior orbital wall fracture. SINUSES: There is scattered opacification of the bilateral ethmoid air cells. Post surgical changes to the right frontal sinus. Chronic right anterior lamina papyracea deformity. Chronic bony nasal septal deformity. SOFT TISSUES AND SKULL: Evidence of prior craniofacial trauma. Post surgical fixation to the right superior orbit and frontal bone. Chronic asymmetry and deformity of the right cribriform plate. There is suspicion for increased posterior propagation of a right nondisplaced linear skull fracture into the parietal bone which may represent superimposed acute skull fracture. No acute displaced or depressed calvarial fracture. Trace left mastoid effusion. No acute soft tissue abnormality. IMPRESSION: 1. Hemorrhagic contusions involving the right greater than left anterior frontal lobes with adjacent sulcal subarachnoid hemorrhage and associated edema. 2. More confluent approximately 4 cm extraaxial hemorrhage along the planum sphenoidale and within the olfactory groove. 3. Mild layering intraventricular hemorrhage within the bilateral occipital horns also likely posttraumatic. 4. Suspicion for acute nondisplaced right parietal skull fracture, extending more posteriorly since 2016. 5. No acute facial bone fracture. 6. These findings were communicated to Dr. Levander by telephone at 2:38 PM. Electronically signed by: prentice bybordi 05/03/2024 03:40 PM EST RP Workstation: [id number]   CT Cervical Spine Wo  Contrast Result Date: 05/03/2024 EXAM: CT CERVICAL SPINE WITHOUT CONTRAST 05/03/2024 01:46:12 PM TECHNIQUE: CT of the cervical spine was performed without the administration of intravenous contrast. Multiplanar reformatted images are provided for review. Automated exposure control, iterative reconstruction, and/or weight based adjustment of the mA/kV was utilized to reduce the radiation dose to as low as reasonably achievable. COMPARISON: None available. CLINICAL HISTORY: Neck trauma (Age >= 65y) FINDINGS: CERVICAL SPINE: BONES AND ALIGNMENT: No acute fracture or traumatic malalignment. 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 DEGENERATIVE CHANGES: There is multilevel intervertebral disc height loss most pronounced at C6-C7. There is fusion of the C3-C4 facet joints on the left. No high-grade spinal canal stenosis within the limitations of CT. There is multilevel uncovertebral spurring and facet arthropathy with varying degrees of neural foraminal stenosis bilaterally. SOFT TISSUES: No prevertebral soft tissue swelling. Bilateral maxillary sinus retention cysts versus polyps. There are  aerated secretions within the piriform sinuses. IMPRESSION: 1. No acute fracture or traumatic malalignment of the cervical spine. Electronically signed by: prentice bybordi 05/03/2024 02:30 PM EST RP Workstation: [id number]   DG Chest Portable 1 View Result Date: 05/03/2024 EXAM: 1 VIEW(S) XRAY OF THE CHEST 05/03/2024 01:31:00 PM COMPARISON: 04/28/2017 bilateral rib fractures are again noted. CLINICAL HISTORY: chest pain FINDINGS: LUNGS AND PLEURA: No focal pulmonary opacity. No pleural effusion. No pneumothorax. HEART AND MEDIASTINUM: No acute abnormality of the cardiac and mediastinal silhouettes. BONES AND SOFT TISSUES: Bilateral rib fractures are again noted. IMPRESSION: 1. No acute cardiopulmonary process. Chronic bilateral rib fractures, unchanged from 04/28/2017. Electronically signed by: Lynwood Seip Dustin Ferrell  05/03/2024 01:54 PM EST RP Workstation: [id number]    EKG: My personal interpretation of EKG shows: Normal sinus rhythm without any acute ST changes.  Assessment/Plan Principal Problem:   Intracranial hemorrhage (HCC) Active Problems:   Diabetes mellitus due to underlying condition without complication, with long-term current use of insulin  (HCC)   Dementia without behavioral disturbance (HCC)   Hypertension associated with diabetes (HCC)   Hx of traumatic brain injury   Seizures [facility])   Patient with intracranial hemorrhage that appears to be traumatic in nature.  Exact etiology of the event is not known as family noticed the bruising and patient has short-term memory so he is unable to recall fall.  This could be secondary to fall versus seizure versus URI symptoms leading to coughing and vomiting which could have induced this injury.  Imaging showing significant intracranial hemorrhage.  Neurosurgery consulted and do not recommend operative intervention given the extensive nature of it and patient's poor surgical candidate.  Will admit patient to stepdown.  I ave added parameters for good blood pressure control with goal of systolic less than 140.  Head of bed elevation greater than 30 degrees.  Per neurosurgery sodium should be greater than 135.  No steroids needed at this time.  Plan for repeat CT head at 8:30 PM.  Did discuss with the family that patient has a guarded prognosis and his mental status may not be at baseline.  Will use IV Depacon  for seizure prophylaxis as patient is already on Depakote .  Hypertension: Will place him on nimodipine  and use IV labetalol  as needed.  Goal systolic less than 40 as mentioned above.  Type 2 diabetes: Start SSI.  Hyperlipidemia: Can continue statin but holding aspirin.  History of seizures: On IV Depakote  as mentioned above.  Seizure precautions ordered.   VTE prophylaxis:  SCDs, can restart Lovenox if CT scan is stable per neurosurgery Diet:  N.p.o. with meds Code Status:  DNR/DNI(Do NOT Intubate) Telemetry:  Admission status: Inpatient, Step Down Unit Patient is from: Home Anticipated d/c is to: Home Anticipated d/c is in: 2-3 days   Family Communication: Updated at bedside  Consults called: EDP discussed case with Dr. Katrina from neurosurgery.   Severity of Illness: The appropriate patient status for this patient is INPATIENT. Inpatient status is judged to be reasonable and necessary in order to provide the required intensity of service to ensure the patient's safety. The patient's presenting symptoms, physical exam findings, and initial radiographic and laboratory data in the context of their chronic comorbidities is felt to place them at high risk for further clinical deterioration. Furthermore, it is not anticipated that the patient will be medically stable for discharge from the hospital within 2 midnights of admission.   * I certify that at the point of admission it is my clinical judgment  that the patient will require inpatient hospital care spanning beyond 2 midnights from the point of admission due to high intensity of service, high risk for further deterioration and high frequency of surveillance required.Dustin Morene Bathe, Dustin Ferrell Jolynn DEL. [facility]

## 2024-05-03 NOTE — Consult Note (Signed)
 Consult requested by:  Dr. Levander  Consult requested for:  Head injury  Primary Physician:  Dustin Ophelia JINNY DOUGLAS, MD  History of Present Illness: 05/03/2024 Dustin Ferrell is here with a chief complaint of recent falls.  He has history of a remote head injury approximately 40 years ago.  He had a severe fall and required fixation of midface fractures.  He suffered a significant head injury at the time.  He had a significant recovery, but has had issues with executive functioning and some memory issues.  His executive functioning has gotten slightly worse over the past few weeks.  He has had multiple falls.  He was felt to be worse than his baseline so was brought in for evaluation.  He does report headaches but denies nausea or vomiting.  He denies any other symptoms.  I have utilized the care everywhere function in epic to review the outside records available from external health systems.  Review of Systems:  A 10 point review of systems is negative, except for the pertinent positives and negatives detailed in the HPI.  Past Medical History: Past Medical History:  Diagnosis Date   Anxiety    associated with frontal temporal behavioral dementia   Basal cell carcinoma 06/03/2021   Left nasal ala - Tx with radiation.   Dementia with behavioral disturbance (HCC)    Diabetes mellitus without complication (HCC)    History of traumatic head injury    Hypertension     Past Surgical History: Past Surgical History:  Procedure Laterality Date   Eye Socket fracture repair Right    HERNIA REPAIR     ROTATOR CUFF REPAIR Left    TONSILLECTOMY     WRIST FRACTURE SURGERY Right     Allergies: Allergies as of 05/03/2024   (No Known Allergies)    Medications: No outpatient medications have been marked as taking for the 05/03/24 encounter [facility] Encounter).    Social History: Social History   Tobacco Use   Smoking status: Every Day    Current packs/day: 1.00    Average  packs/day: 1 pack/day for 60.0 years (60.0 ttl pk-yrs)    Types: Cigarettes   Smokeless tobacco: Never  Vaping Use   Vaping status: Former   Substances: Nicotine  Substance Use Topics   Alcohol use: Yes    Alcohol/week: 1.0 standard drink of alcohol    Types: 1 Cans of beer per week    Comment: occ   Drug use: No    Family Medical History: Family History  Problem Relation Age of Onset   Stomach cancer Mother    Heart attack Father    Diabetes Father    Colon cancer Neg Hx     Physical Examination: Vitals:   05/03/24 1730 05/03/24 1735  BP: (!) 161/69   Pulse: 71   Resp: 16   Temp:  98.3 F (36.8 C)  SpO2: 96%     General: Patient is in no apparent distress. Attention to examination is appropriate.  Neck:   Supple.  Full range of motion.  Respiratory: Patient is breathing without any difficulty.  He has bilateral black eyes.  He has significant facial swelling.  He has no visible lacerations.  NEUROLOGICAL:     Awake, alert, oriented to person, place, and time.  Speech is clear and fluent.  Cranial Nerves: Pupils equal round and reactive to light.  Facial tone is symmetric.  Facial sensation is symmetric. Shoulder shrug is symmetric. Tongue protrusion is  midline.  There is no pronator drift.  Strength: Side Biceps Triceps Deltoid Interossei Grip Wrist Ext. Wrist Flex.  R 5 5 5 5 5 5 5   L 5 5 5 5 5 5 5    Side Iliopsoas Quads Hamstring PF DF EHL  R 5 5 5 5 5 5   L 5 5 5 5 5 5     Bilateral upper and lower extremity sensation is intact to light touch.    No evidence of dysmetria noted.  Gait is untested.     Medical Decision Making  Imaging: CT Head 05/03/2024 IMPRESSION: 1. Hemorrhagic contusions involving the right greater than left anterior frontal lobes with adjacent sulcal subarachnoid hemorrhage and associated edema. 2. More confluent approximately 4 cm extraaxial hemorrhage along the planum sphenoidale and within the olfactory groove. 3. Mild  layering intraventricular hemorrhage within the bilateral occipital horns also likely posttraumatic. 4. Suspicion for acute nondisplaced right parietal skull fracture, extending more posteriorly since 2016. 5. No acute facial bone fracture. 6. These findings were communicated to Dr. Levander by telephone at 2:38 PM.   Electronically signed by: prentice bybordi 05/03/2024 03:40 PM EST RP Workstation: [id number]  I have personally reviewed the images and agree with the above interpretation.  Assessment and Plan: Mr. Heffern is a pleasant 78 y.o. male with a minor head injury on top of prior significant head injury.  He has history of encephalomalacia affecting the right frontal lobe that was present on prior imaging.  Today he shows radiographic evidence of bifrontal contusions.  He additionally has small area of subdural hematoma along the skull base.  He previously had a skull fracture which now shows evidence of extension of his fracture posteriorly on the right side through the frontal and parietal bones.  Fortunately, he is currently near his neurologic baseline.  His current Glasgow Coma Scale is 15.  I have recommended repeating his imaging and following his sodium closely.  I do not think he requires surgical intervention currently.  Will admit him to the stepdown unit and continue close observation with repeat imaging.  We discussed keeping his blood pressure below 140 systolic and maintaining sodium above 135.  He is already on antiepileptics.  If stable tomorrow, he can start preventative chemoprophylaxis and start working with physical and Occupational Therapy.    I have communicated my recommendations to the requesting physician and coordinated care to facilitate these recommendations.     Jozalyn Baglio K. Clois MD, [facility] Neurosurgery

## 2024-05-03 NOTE — ED Provider Notes (Signed)
 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 Has bruisning in face. Dont know how, but likely fall. He lives at home with family. No deficits. Elevated troponin.   [HD]  1550 Case reviewed with Dr. Katrina with neurosurgery.  He will review the patient's imaging and provide recommendations.  Signed out to Dr. Nicholaus pending neurosurgery recommendations and disposition. [NR]  [id number] Per neurosurgery consultant who reviewed images and case discussed: patient may beed placement if he is worse than normal because of risk of falls and impulsive behavior.  would start some AED for 1 week if not already on it, hold anticoagulants, admit to medicine (floor or stepdown) if placement might be needed [HD]  1609 Patient and family advised of plan from neurosurgery and in agreement.  Wife states that patient did take his Depakote  this morning and has not missed any doses [HD]  1611 Troponin T High Sensitivity(!): 52 Stable and downtrending [HD]  1611 Hospitalist paged [HD]  1649 Case discussed with hospitalist for admission, patient's daughter who is a family physician Dr. Barnie Jenkins phone number [phone] updated [HD]    Clinical Course User Index [HD] Nicholaus Rolland BRAVO, MD [NR] Levander Slate, MD      Nicholaus Rolland BRAVO, MD 05/03/24 1700

## 2024-05-03 NOTE — ED Notes (Signed)
 Patient transported to CT

## 2024-05-04 DIAGNOSIS — I629 Nontraumatic intracranial hemorrhage, unspecified: Secondary | ICD-10-CM | POA: Diagnosis not present

## 2024-05-04 LAB — CBC
HCT: 33.4 % — ABNORMAL LOW (ref 39.0–52.0)
Hemoglobin: 11.1 g/dL — ABNORMAL LOW (ref 13.0–17.0)
MCH: 30.6 pg (ref 26.0–34.0)
MCHC: 33.2 g/dL (ref 30.0–36.0)
MCV: 92 fL (ref 80.0–100.0)
Platelets: 155 K/uL (ref 150–400)
RBC: 3.63 MIL/uL — ABNORMAL LOW (ref 4.22–5.81)
RDW: 14.2 % (ref 11.5–15.5)
WBC: 6.7 K/uL (ref 4.0–10.5)
nRBC: 0 % (ref 0.0–0.2)

## 2024-05-04 LAB — CBG MONITORING, ED
Glucose-Capillary: 108 mg/dL — ABNORMAL HIGH (ref 70–99)
Glucose-Capillary: 108 mg/dL — ABNORMAL HIGH (ref 70–99)
Glucose-Capillary: 94 mg/dL (ref 70–99)
Glucose-Capillary: 100 mg/dL — ABNORMAL HIGH (ref 70–99)

## 2024-05-04 LAB — BASIC METABOLIC PANEL WITH GFR
Anion gap: 11 (ref 5–15)
BUN: 26 mg/dL — ABNORMAL HIGH (ref 8–23)
CO2: 23 mmol/L (ref 22–32)
Calcium: 8.9 mg/dL (ref 8.9–10.3)
Chloride: 101 mmol/L (ref 98–111)
Creatinine, Ser: 0.7 mg/dL (ref 0.61–1.24)
GFR, Estimated: >60 mL/min (ref >60)
Glucose, Bld: 108 mg/dL — ABNORMAL HIGH (ref 70–99)
Potassium: 3.9 mmol/L (ref 3.5–5.1)
Sodium: 135 mmol/L (ref 135–145)

## 2024-05-04 LAB — GLUCOSE, CAPILLARY
Glucose-Capillary: 175 mg/dL — ABNORMAL HIGH (ref 70–99)
Glucose-Capillary: 268 mg/dL — ABNORMAL HIGH (ref 70–99)
Glucose-Capillary: 311 mg/dL — ABNORMAL HIGH (ref 70–99)

## 2024-05-04 LAB — SODIUM: Sodium: 137 mmol/L (ref 135–145)

## 2024-05-04 MED ORDER — LORAZEPAM 2 MG/ML IJ SOLN
1.0000 mg | INTRAMUSCULAR | Status: AC | PRN
  Administered 2024-05-04 – 2024-05-05 (×2): 1 mg via INTRAVENOUS
  Filled 2024-05-04 (×2): qty 1

## 2024-05-04 MED ORDER — LABETALOL HCL 5 MG/ML IV SOLN
10.0000 mg | INTRAVENOUS | Status: DC | PRN
  Administered 2024-05-04 – 2024-05-06 (×6): 10 mg via INTRAVENOUS
  Filled 2024-05-04 (×6): qty 4

## 2024-05-04 MED ORDER — SERTRALINE HCL 50 MG PO TABS
200.0000 mg | ORAL_TABLET | Freq: Every day | ORAL | Status: DC
  Administered 2024-05-04 – 2024-05-13 (×9): 200 mg via ORAL
  Filled 2024-05-04 (×10): qty 4

## 2024-05-04 MED ORDER — LABETALOL HCL 5 MG/ML IV SOLN
10.0000 mg | Freq: Once | INTRAVENOUS | Status: AC
  Administered 2024-05-04: 10 mg via INTRAVENOUS

## 2024-05-04 MED ORDER — AMLODIPINE BESYLATE 5 MG PO TABS
5.0000 mg | ORAL_TABLET | Freq: Every day | ORAL | Status: DC
  Administered 2024-05-04 – 2024-05-05 (×2): 5 mg via ORAL
  Filled 2024-05-04 (×2): qty 1

## 2024-05-04 MED ORDER — DIVALPROEX SODIUM 500 MG PO DR TAB
500.0000 mg | DELAYED_RELEASE_TABLET | Freq: Three times a day (TID) | ORAL | Status: DC
  Administered 2024-05-04 – 2024-05-09 (×14): 500 mg via ORAL
  Filled 2024-05-04 (×15): qty 1

## 2024-05-04 MED ORDER — HEPARIN SODIUM (PORCINE) 5000 UNIT/ML IJ SOLN
5000.0000 [IU] | Freq: Three times a day (TID) | INTRAMUSCULAR | Status: DC
  Administered 2024-05-04 – 2024-05-06 (×5): 5000 [IU] via SUBCUTANEOUS
  Filled 2024-05-04 (×4): qty 1

## 2024-05-04 MED ORDER — BUSPIRONE HCL 10 MG PO TABS
5.0000 mg | ORAL_TABLET | Freq: Two times a day (BID) | ORAL | Status: DC
  Administered 2024-05-04 – 2024-05-13 (×17): 5 mg via ORAL
  Filled 2024-05-04 (×18): qty 1

## 2024-05-04 MED ORDER — HALOPERIDOL LACTATE 5 MG/ML IJ SOLN
5.0000 mg | Freq: Four times a day (QID) | INTRAMUSCULAR | Status: DC | PRN
  Administered 2024-05-04 – 2024-05-06 (×4): 5 mg via INTRAMUSCULAR
  Filled 2024-05-04 (×5): qty 1

## 2024-05-04 MED ORDER — LISINOPRIL 20 MG PO TABS
20.0000 mg | ORAL_TABLET | Freq: Every day | ORAL | Status: DC
  Administered 2024-05-04 – 2024-05-05 (×2): 20 mg via ORAL
  Filled 2024-05-04: qty 1
  Filled 2024-05-04: qty 2

## 2024-05-04 MED ORDER — QUETIAPINE FUMARATE 25 MG PO TABS
25.0000 mg | ORAL_TABLET | Freq: Every day | ORAL | Status: DC
  Administered 2024-05-04 – 2024-05-05 (×3): 25 mg via ORAL
  Filled 2024-05-04 (×3): qty 1

## 2024-05-04 NOTE — ED Notes (Signed)
 Paging hospitalist to ask for PRN order for agitation med. Pt is restless, not sitting still, attempting to remove mitts.

## 2024-05-04 NOTE — Evaluation (Signed)
 Physical Therapy Evaluation (Co-eval with OT) Patient Details Name: Dustin Ferrell MRN: [id number] DOB: Feb 14, 1946 Today's Date: 05/04/2024  History of Present Illness  Dustin Ferrell is a pleasant 78 y.o. male with a minor head injury on top of prior significant head injury. He has history of encephalomalacia affecting the right frontal lobe that was present on prior imaging. Today he shows radiographic evidence of bifrontal contusions.  He additionally has small area of subdural hematoma along the skull base. He previously had a skull fracture which now shows evidence of extension of his fracture posteriorly on the right side through the frontal and parietal bones.  Clinical Impression  78 yo Male admitted to hospital after family noticed bruising on the face and it was determined he had subdural hematoma. Patient has a history of TBI and unable to recall falling. Family did not witness a fall. 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 He lives at home with his wife who is his primary caregiver. She reports he can bathe/dress himself, although does need some help from time to time. He did have difficulty donning shoes/socks with impaired sitting balance, falling posteriorly while trying to reach feet. He ambulates with rollator at baseline. Currently breaks on his rollator are not fully operational. Patient demonstrates impulsivity, often reaching for rollator and/or furniture to stand without applying locks or asking for assistance. He requires min A +2 (to manage equipment) for safety. Patient ambulated 100 feet on level surface with 1 sitting rest break, requiring CGA to close stand by assist. He ambulates with normal base of support, decreased step length, slower gait speed. PT identified increased shaking in RLE. Patient occasionally will mis-step/wobble but is able to recover with close stand by assist. Patient's wife reports that is different  from baseline. He also ambulates with BUE shoulder elevation with heavy grip on rollator. He would benefit from skilled PT intervention to improve balance/gait safety. Wife would ultimately like for patient to return home but she is concerned about him falling. She states she will not be able to assist him to stand if he falls. Also prior to admittance patient would be home alone sometimes as wife would run errands and/or go to exercise class.         If plan is discharge home, recommend the following: A little help with walking and/or transfers;A lot of help with bathing/dressing/bathroom;Direct supervision/assist for medications management;Help with stairs or ramp for entrance;Assist for transportation;Assistance with cooking/housework;Direct supervision/assist for financial management;Supervision due to cognitive status   Can travel by private vehicle   No    Equipment Recommendations None recommended by PT  Recommendations for Other Services       Functional Status Assessment Patient has had a recent decline in their functional status and/or demonstrates limited ability to make significant improvements in function in a reasonable and predictable amount of time     Precautions / Restrictions Precautions Precautions: Fall Restrictions Weight Bearing Restrictions Per Provider Order: No      Mobility  Bed Mobility                    Transfers                        Ambulation/Gait Ambulation/Gait assistance: Contact guard assist, Supervision Gait Distance (Feet): 100 Feet Assistive device: Rollator (4 wheels)   Gait velocity: decreased     General Gait Details: pt ambulates with rollator,  demonstrating bilateral shoulder elevation with increased grip on rollator, normal base of support, decreased step length, slightly slower gait speed; pt will ocasionally mis-step and wobble which patient's wife reports is different from baseline; He is also walking slower  than baseline; demonstrates increased shaking in RLE with prolonged ambulation  Stairs            Wheelchair Mobility     Tilt Bed    Modified [name] (Stroke Patients Only)       Balance Overall balance assessment: Needs assistance Sitting-balance support: Feet unsupported, No upper extremity supported Sitting balance-Leahy Scale: Poor Sitting balance - Comments: falling posteriorly when trying to don shoes/socks requiring +1 physical assist to maintain sitting balance Postural control: Posterior lean Standing balance support: Bilateral upper extremity supported, During functional activity Standing balance-Leahy Scale: Fair Standing balance comment: able to stand with rollator support with fair standing balance; reliant on Rollator during ambulation                             Pertinent Vitals/Pain Pain Assessment Pain Assessment: No/denies pain    Home Living Family/patient expects to be discharged to:: Private residence Living Arrangements: Spouse/significant other Available Help at Discharge: Family Type of Home: House Home Access: Stairs to enter   Secretary/administrator of Steps: 1   Home Layout: One level Home Equipment: Rollator (4 wheels);Cane - single point      Prior Function Prior Level of Function : Needs assist             Mobility Comments: Pt ambulates with rollator or cane, although wife reports that he often forgets to use them. ADLs Comments: Able to toilet, shower w/ Mod I. Requires assist with dressing -- buttons, zippers, etc. Does not drive.     Extremity/Trunk Assessment   Upper Extremity Assessment Upper Extremity Assessment: Defer to OT evaluation    Lower Extremity Assessment Lower Extremity Assessment: Generalized weakness    Cervical / Trunk Assessment Cervical / Trunk Assessment: Kyphotic  Communication   Communication Factors Affecting Communication: Difficulty expressing self    Cognition Arousal:  Lethargic Behavior During Therapy: WFL for tasks assessed/performed                           PT - Cognition Comments: oriented to place, year, situation Following commands: Intact       Cueing Cueing Techniques: Verbal cues     General Comments General comments (skin integrity, edema, etc.): some bruising noted along anterior face/head; no swelling in BLE    Exercises     Assessment/Plan    PT Assessment Patient needs continued PT services  PT Problem List Decreased strength;Decreased cognition;Decreased activity tolerance;Decreased balance;Decreased safety awareness;Decreased mobility;Decreased knowledge of precautions       PT Treatment Interventions DME instruction;Balance training;Gait training;Stair training;Functional mobility training;Patient/family education;Therapeutic activities;Therapeutic exercise    PT Goals (Current goals can be found in the Care Plan section)  Acute Rehab PT Goals Patient Stated Goal: pt did not state goal; wife's goal is for him to get better PT Goal Formulation: With patient/family Time For Goal Achievement: 05/18/24 Potential to Achieve Goals: Good    Frequency Min 1X/week     Co-evaluation PT/OT/SLP Co-Evaluation/Treatment: Yes Reason for Co-Treatment: Necessary to address cognition/behavior during functional activity;For patient/therapist safety;To address functional/ADL transfers PT goals addressed during session: Mobility/safety with mobility;Balance;Proper use of DME;Strengthening/ROM OT goals addressed during session: ADL's and self-care;Proper  use of Adaptive equipment and DME;Strengthening/ROM       AM-PAC PT 6 Clicks Mobility  Outcome Measure Help needed turning from your back to your side while in a flat bed without using bedrails?: A Little Help needed moving from lying on your back to sitting on the side of a flat bed without using bedrails?: A Little Help needed moving to and from a bed to a chair  (including a wheelchair)?: A Little Help needed standing up from a chair using your arms (e.g., wheelchair or bedside chair)?: A Little Help needed to walk in hospital room?: A Little Help needed climbing 3-5 steps with a railing? : A Little 6 Click Score: 18    End of Session Equipment Utilized During Treatment: Gait belt Activity Tolerance: Patient tolerated treatment well;No increased pain Patient left: in bed;with family/visitor present Nurse Communication: Mobility status PT Visit Diagnosis: Other abnormalities of gait and mobility (R26.89);Unsteadiness on feet (R26.81)    Time: 8798-8764 PT Time Calculation (min) (ACUTE ONLY): 34 min   Charges:   PT Evaluation $PT Eval Low Complexity: 1 Low   PT General Charges $$ ACUTE PT VISIT: 1 Visit           Kaelon Weekes PT, DPT 05/04/2024, 1:46 PM

## 2024-05-04 NOTE — ED Notes (Signed)
 Retaking BP for accurate reading.

## 2024-05-04 NOTE — ED Notes (Signed)
 Pt resting comfortably with wife at bedside. Pt ate 100% of lunch tray.

## 2024-05-04 NOTE — ED Notes (Signed)
 Pt is sleeping

## 2024-05-04 NOTE — Progress Notes (Signed)
 Neurosurgery Progress Note  History: Dustin Ferrell is here for traumatic brain injury  HD2: No issues overnight.  He still has a headache.  Otherwise he has no complaints  Physical Exam: Vitals:   05/04/24 0941 05/04/24 1000  BP:  (!) 181/99  Pulse:  (!) 55  Resp:  20  Temp: 97.7 F (36.5 C)   SpO2:  96%    AA Ox3 CNI  Strength:5/5 throughout BUE and BLE  He is impulsive and does get up and need to redirection  Data:  Other tests/results:  Repeat CT reviewed-it is stable  Assessment/Plan:  Dustin Ferrell is here with traumatic brain injury with bifrontal contusions, subdural hematoma, and subarachnoid hemorrhage.  - mobilize - pain control - DVT prophylaxis okay to start today - PTOT - Maintain sodium above 135 and continue antiepileptic medication - Other care per primary team   Dustin Daisy MD, [facility] Department of Neurosurgery

## 2024-05-04 NOTE — ED Notes (Signed)
 Messaged hospitalist for PRN med for agitation.

## 2024-05-04 NOTE — Plan of Care (Signed)
  Problem: Education: Goal: Knowledge of disease or condition will improve Outcome: Progressing   Problem: Intracerebral Hemorrhage Tissue Perfusion: Goal: Complications of Intracerebral Hemorrhage will be minimized Outcome: Progressing   Problem: Coping: Goal: Will verbalize positive feelings about self Outcome: Progressing   Problem: Health Behavior/Discharge Planning: Goal: Ability to manage health-related needs will improve Outcome: Progressing   Problem: Self-Care: Goal: Ability to participate in self-care as condition permits will improve Outcome: Progressing   Problem: Nutrition: Goal: Risk of aspiration will decrease Outcome: Progressing   Problem: Education: Goal: Ability to describe self-care measures that may prevent or decrease complications (Diabetes Survival Skills Education) will improve Outcome: Progressing   Problem: Coping: Goal: Ability to adjust to condition or change in health will improve Outcome: Progressing   Problem: Fluid Volume: Goal: Ability to maintain a balanced intake and output will improve Outcome: Progressing   Problem: Metabolic: Goal: Ability to maintain appropriate glucose levels will improve Outcome: Progressing

## 2024-05-04 NOTE — ED Notes (Signed)
 PT and OT walking pt in hall.

## 2024-05-04 NOTE — ED Notes (Signed)
 Wife at bedside. Pt is attempting to remove mitts. Pt is very confused. Waiting for PRN orders for agitation.

## 2024-05-04 NOTE — Progress Notes (Signed)
 Progress Note    Dustin Ferrell  FMW:[id number] DOB: Aug 07, 1945  DOA: 05/03/2024 PCP: [name], MD      Brief Narrative:    Medical records reviewed and are as summarized below:  Dustin Ferrell is a 78 y.o. 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  There is reports of recent cough, vomiting and URI.  No report offer for though patient has poor memory.  Workup in the ED revealed intracranial hemorrhage.       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   Intracranial hemorrhage, subdural hematoma, subarachnoid hemorrhage likely traumatic: Repeat CT head showed stability and intracranial bleed.  He was evaluated by Dr. Katrina, neurosurgeon.  No surgical intervention recommended. PT and OT evaluation. Discontinue nimodipine  Start heparin  subcu for DVT prophylaxis Case discussed with Dr. Katrina, neurosurgeon.   Hypertensive urgency: Resume home amlodipine  and lisinopril .  Use IV labetalol  as needed.   Type II DM: NovoLog  as needed for hyperglycemia   Dementia with behavioral disturbances/agitation: IM Haldol  as needed for agitation.  Add low-dose Seroquel .  This was discussed with his daughter, Dr. Jenkins who is agreeable with the plan.   Comorbidities include anxiety, history of  traumatic brain injury, seizure disorder.    Diet Order             Diet heart healthy/carb modified Room service appropriate? Yes; Fluid consistency: Thin  Diet effective now                                  Consultants: Neurosurgeon  Procedures: None    Medications:     stroke: early stages of recovery book   Does not apply Once   amLODipine   5 mg Oral Daily   busPIRone   5 mg Oral BID   divalproex   500 mg Oral TID   heparin  injection (subcutaneous)  5,000 Units Subcutaneous Q8H   insulin  aspart  0-9 Units Subcutaneous Q6H   lisinopril   20 mg Oral Daily   pantoprazole  (PROTONIX ) IV  40 mg Intravenous QHS   QUEtiapine   25 mg Oral QHS   sertraline   200 mg Oral Daily   sodium chloride  flush  3 mL Intravenous Q12H   Continuous Infusions:     Anti-infectives (From admission, onward)    None              Family Communication/Anticipated D/C date and plan/Code Status   DVT prophylaxis: heparin  injection 5,000 Units Start: 05/04/24 1615 SCDs Start: 05/03/24 1750     Code Status: Limited: Do not attempt resuscitation (DNR) -DNR-LIMITED -Do Not Intubate/DNI   Family Communication: Plan discussed with Dr. Barnie Jenkins, daughter, over the phone Disposition Plan: Plan to discharge to SNF   Status is: Inpatient Remains inpatient appropriate because: Intracranial hemorrhage, agitation  Subjective:   Interval events noted.  No complaints.  No headache, dizziness, weakness or numbness in the extremities, no vomiting. Dr. Katrina, neurosurgeon, walked into the room during this encounter.  Objective:    Vitals:   05/04/24 1500 05/04/24 1530 05/04/24 1532 05/04/24 1550  BP: (!) 166/70 (!) 167/79  109/69  Pulse: 62 62    Resp: 14 19    Temp:   98.3 F (36.8 C)   TempSrc:   Axillary   SpO2: 100% 100%    Weight:      Height:       No data found.   Intake/Output Summary (Last 24 hours) at 05/04/2024 1615 Last data  filed at 05/04/2024 1033 Gross per 24 hour  Intake 100 ml  Output 1325 ml  Net -1225 ml   Filed Weights   05/03/24 1228  Weight: 74.8 kg    Exam:  GEN: NAD SKIN: No rash EYES: EOMI, PERRLA, periorbital ecchymosis/bruising ENT: MMM CV: RRR PULM: CTA B ABD: soft, ND, NT, +BS CNS: AAO x 3, non focal EXT: No edema or tenderness        Data Reviewed:   I have personally reviewed following labs and imaging studies:  Labs: Labs show the following:   Basic Metabolic Panel: Recent Labs  Lab 05/03/24 1257 05/03/24 2158 05/04/24 0527 05/04/24 0935  NA 135 134* 135 137  K 4.3  --  3.9  --   CL 97*  --  101  --   CO2 24  --  23  --   GLUCOSE 181*  --  108*  --   BUN 30*  --  26*  --   CREATININE 0.87  --  0.70  --   CALCIUM  9.6  --  8.9  --   MG  --  1.9  --   --    GFR Estimated Creatinine Clearance: 69.6 mL/min (by C-G formula based on SCr of 0.7 mg/dL). Liver Function Tests: Recent Labs  Lab 05/03/24 1257  AST 23  ALT 23  ALKPHOS 61  BILITOT 0.6  PROT 7.2  ALBUMIN 4.3   No results for input(s): LIPASE, AMYLASE in the last 168 hours. No results for input(s): AMMONIA in the last 168 hours. Coagulation profile No results for input(s): INR, PROTIME in the last 168 hours.  CBC: Recent Labs  Lab 05/03/24 1257 05/04/24 0527  WBC 8.8 6.7  NEUTROABS 6.6  --   HGB 12.2* 11.1*  HCT 35.9* 33.4*  MCV 90.2 92.0  PLT 177 155   Cardiac Enzymes: No results for input(s): CKTOTAL, CKMB, CKMBINDEX, TROPONINI in the last 168 hours. BNP (last 3 results) No results for input(s): PROBNP in the last 8760 hours. CBG: Recent Labs  Lab 05/03/24 1930 05/04/24 0133 05/04/24 0524 05/04/24 0753 05/04/24 1142  GLUCAP 129* 108* 108* 94 100*   D-Dimer: No results for input(s): DDIMER in the last 72 hours. Hgb A1c: No results for input(s): HGBA1C in the last 72 hours. Lipid Profile: No results for input(s): CHOL, HDL, LDLCALC,  TRIG, CHOLHDL, LDLDIRECT in the last 72 hours. Thyroid function studies: No results for input(s): TSH, T4TOTAL, T3FREE, THYROIDAB in the last 72 hours.  Invalid input(s): FREET3 Anemia work up: No results for input(s): VITAMINB12, FOLATE, FERRITIN, TIBC, IRON, RETICCTPCT in the last 72 hours. Sepsis Labs: Recent Labs  Lab 05/03/24 1257 05/04/24 0527  WBC 8.8 6.7    Microbiology No results found for this or any previous visit (from the past 240  hours).  Procedures and diagnostic studies:  CT HEAD WO CONTRAST Result Date: 05/03/2024 EXAM: CT HEAD WITHOUT CONTRAST 05/03/2024 08:36:30 PM TECHNIQUE: CT of the head was performed without the administration of intravenous contrast. Automated exposure control, iterative reconstruction, and/or weight based adjustment of the mA/kV was utilized to reduce the radiation dose to as low as reasonably achievable. COMPARISON: Earlier today. CLINICAL HISTORY: Stroke, follow up. FINDINGS: BRAIN AND VENTRICLES: Extensive hemorrhagic contusions involving the anterior frontal lobes bilaterally, unchanged. Surrounding edema within the frontal lobes, also stable. Adjacent subarachnoid hemorrhage. Right frontal subdural hematoma measures 3 mm in thickness, stable. Contusion in the left inferior temporal lobe along the floor of the left middle cranial fossa is stable. No evidence of acute infarct. No hydrocephalus. No midline shift. ORBITS: No acute abnormality. SINUSES: No acute abnormality. SOFT TISSUES AND SKULL: Right frontal skull fracture extending toward the high right parietal bone is unchanged. No acute soft tissue abnormality. IMPRESSION: 1. Extensive hemorrhagic contusions involving the anterior frontal lobes bilaterally with surrounding edema, unchanged. 2. Adjacent subarachnoid hemorrhage, unchanged. 3. Stable right frontal subdural hematoma measuring 3 mm in thickness. 4. Stable contusion in the left inferior temporal lobe and on  the floor of the left middle cranial fossa. 5. Unchanged right frontal skull fracture extending toward the high right parietal bone. 6. No hydrocephalus or midline shift. Electronically signed by: Franky Crease MD 05/03/2024 08:48 PM EST RP Workstation: [id number]   CT Head Wo Contrast Result Date: 05/03/2024 EXAM: CT HEAD WITHOUT CONTRAST 05/03/2024 01:46:12 PM TECHNIQUE: CT of the head was performed without the administration of intravenous contrast. Automated exposure control, iterative reconstruction, and/or weight based adjustment of the mA/kV was utilized to reduce the radiation dose to as low as reasonably achievable. COMPARISON: MRI brain 08/17/2023 and CT head 09/30/2014. CLINICAL HISTORY: Head trauma, minor (Age >= 65y). FINDINGS: BRAIN AND VENTRICLES: Extensive hemorrhagic contusions involving the right greater than left bilateral anterior frontal lobes with adjacent foci of sulcal subarachnoid hemorrhage. There is adjacent parenchymal hypoattenuation likely representing edema. There is a more confluent 4.2 x 2.6 x 1.3 cm (AP x TR CC) extraaxial hemorrhage along the planum sphenoidale and within the right olfactory groove. There is preexisting encephalomalacia and gliosis involving the right frontal lobe. There are additional foci of hemorrhagic contusion and subarachnoid hemorrhage involving the inferior left temporal lobe/ left middle cranial fossa. There is a thin significant right temporal convexity subdural hemorrhage . No acute territorial infarction. No midline shift. There are mild layering hemorrhages within the bilateral occipital horns. No hydrocephalus. ORBITS: Post surgical fixation to the right superior orbit. Chronic right inferior orbital wall fracture. SINUSES: There is scattered opacification of the bilateral ethmoid air cells. Post surgical changes to the right frontal sinus. Chronic right anterior lamina papyracea deformity. Chronic bony nasal septal deformity. SOFT TISSUES AND SKULL:  Evidence of prior craniofacial trauma. Post surgical fixation to the right superior orbit and frontal bone. Chronic asymmetry and deformity of the right cribriform plate. There is suspicion for increased posterior propagation of a right nondisplaced linear skull fracture into the parietal bone which may represent superimposed acute skull fracture. No acute displaced or depressed calvarial fracture. Trace left mastoid effusion. No acute soft tissue abnormality. IMPRESSION: 1. Hemorrhagic contusions involving the right greater than left anterior frontal lobes with adjacent sulcal subarachnoid hemorrhage and associated edema. 2. More confluent approximately 4 cm extraaxial hemorrhage along the planum sphenoidale and within the olfactory groove. 3. Mild layering intraventricular hemorrhage within the bilateral occipital horns also  likely posttraumatic. 4. Suspicion for acute nondisplaced right parietal skull fracture, extending more posteriorly since 2016. 5. No acute facial bone fracture. 6. These findings were communicated to Dr. Levander by telephone at 2:38 PM. Electronically signed by: prentice bybordi 05/03/2024 03:40 PM EST RP Workstation: [id number]   CT Maxillofacial Wo Contrast Result Date: 05/03/2024 EXAM: CT HEAD WITHOUT CONTRAST 05/03/2024 01:46:12 PM TECHNIQUE: CT of the head was performed without the administration of intravenous contrast. Automated exposure control, iterative reconstruction, and/or weight based adjustment of the mA/kV was utilized to reduce the radiation dose to as low as reasonably achievable. COMPARISON: MRI brain 08/17/2023 and CT head 09/30/2014. CLINICAL HISTORY: Head trauma, minor (Age >= 65y). FINDINGS: BRAIN AND VENTRICLES: Extensive hemorrhagic contusions involving the right greater than left bilateral anterior frontal lobes with adjacent foci of sulcal subarachnoid hemorrhage. There is adjacent parenchymal hypoattenuation likely representing edema. There is a more confluent 4.2 x  2.6 x 1.3 cm (AP x TR CC) extraaxial hemorrhage along the planum sphenoidale and within the right olfactory groove. There is preexisting encephalomalacia and gliosis involving the right frontal lobe. There are additional foci of hemorrhagic contusion and subarachnoid hemorrhage involving the inferior left temporal lobe/ left middle cranial fossa. There is a thin significant right temporal convexity subdural hemorrhage . No acute territorial infarction. No midline shift. There are mild layering hemorrhages within the bilateral occipital horns. No hydrocephalus. ORBITS: Post surgical fixation to the right superior orbit. Chronic right inferior orbital wall fracture. SINUSES: There is scattered opacification of the bilateral ethmoid air cells. Post surgical changes to the right frontal sinus. Chronic right anterior lamina papyracea deformity. Chronic bony nasal septal deformity. SOFT TISSUES AND SKULL: Evidence of prior craniofacial trauma. Post surgical fixation to the right superior orbit and frontal bone. Chronic asymmetry and deformity of the right cribriform plate. There is suspicion for increased posterior propagation of a right nondisplaced linear skull fracture into the parietal bone which may represent superimposed acute skull fracture. No acute displaced or depressed calvarial fracture. Trace left mastoid effusion. No acute soft tissue abnormality. IMPRESSION: 1. Hemorrhagic contusions involving the right greater than left anterior frontal lobes with adjacent sulcal subarachnoid hemorrhage and associated edema. 2. More confluent approximately 4 cm extraaxial hemorrhage along the planum sphenoidale and within the olfactory groove. 3. Mild layering intraventricular hemorrhage within the bilateral occipital horns also likely posttraumatic. 4. Suspicion for acute nondisplaced right parietal skull fracture, extending more posteriorly since 2016. 5. No acute facial bone fracture. 6. These findings were communicated  to Dr. Levander by telephone at 2:38 PM. Electronically signed by: prentice bybordi 05/03/2024 03:40 PM EST RP Workstation: [id number]   CT Cervical Spine Wo Contrast Result Date: 05/03/2024 EXAM: CT CERVICAL SPINE WITHOUT CONTRAST 05/03/2024 01:46:12 PM TECHNIQUE: CT of the cervical spine was performed without the administration of intravenous contrast. Multiplanar reformatted images are provided for review. Automated exposure control, iterative reconstruction, and/or weight based adjustment of the mA/kV was utilized to reduce the radiation dose to as low as reasonably achievable. COMPARISON: None available. CLINICAL HISTORY: Neck trauma (Age >= 65y) FINDINGS: CERVICAL SPINE: BONES AND ALIGNMENT: No acute fracture or traumatic malalignment. 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 DEGENERATIVE CHANGES: There is multilevel intervertebral disc height loss most pronounced at C6-C7. There is fusion of the C3-C4 facet joints on the left. No high-grade spinal canal stenosis within the limitations of CT. There is multilevel uncovertebral spurring and facet arthropathy with varying degrees of neural foraminal stenosis bilaterally. SOFT TISSUES: No  prevertebral soft tissue swelling. Bilateral maxillary sinus retention cysts versus polyps. There are aerated secretions within the piriform sinuses. IMPRESSION: 1. No acute fracture or traumatic malalignment of the cervical spine. Electronically signed by: prentice bybordi 05/03/2024 02:30 PM EST RP Workstation: [id number]   DG Chest Portable 1 View Result Date: 05/03/2024 EXAM: 1 VIEW(S) XRAY OF THE CHEST 05/03/2024 01:31:00 PM COMPARISON: 04/28/2017 bilateral rib fractures are again noted. CLINICAL HISTORY: chest pain FINDINGS: LUNGS AND PLEURA: No focal pulmonary opacity. No pleural effusion. No pneumothorax. HEART AND MEDIASTINUM: No acute abnormality of the cardiac and mediastinal silhouettes. BONES AND SOFT TISSUES: Bilateral rib fractures are again noted.  IMPRESSION: 1. No acute cardiopulmonary process. Chronic bilateral rib fractures, unchanged from 04/28/2017. Electronically signed by: Lynwood Seip MD 05/03/2024 01:54 PM EST RP Workstation: [id number]               LOS: 1 day   Naomie Crow  [facility]   Pager on [email or web address]. If 7PM-7AM, please contact night-coverage at [email or web address]     05/04/2024, 4:15 PM

## 2024-05-04 NOTE — ED Notes (Signed)
 Upon assessment pt was sitting at edge of bed with IV ripped out covered in blood. Pt guided back into bed and bleeding from IV site controlled. Full linen changed provided by this RN and maria NT. New line placed without incident. New gown and brief placed. This RN did notice black stool on the ground. MD Bradler notified. NAD. Cb within reach. Seizure pads in place.

## 2024-05-04 NOTE — ED Notes (Signed)
 Soft mitts placed.

## 2024-05-04 NOTE — ED Notes (Signed)
 Pt removed brief and IV and was trying to get out of bed. Pt back in bed. Bed alarm/socks are on.

## 2024-05-04 NOTE — ED Notes (Signed)
 IV does not draw back, unable to get sodium level at this time will call lab.

## 2024-05-04 NOTE — ED Notes (Signed)
 Daughter Olam updated @ [phone].

## 2024-05-04 NOTE — ED Notes (Signed)
 Pt getting back into bed after walking with PT and OT.

## 2024-05-04 NOTE — Evaluation (Signed)
 Occupational Therapy Evaluation Patient Details Name: Dustin Ferrell MRN: [id number] DOB: Oct 10, 1945 Today's Date: 05/04/2024   History of Present Illness   Dustin Ferrell is a pleasant 78 y.o. male with a minor head injury on top of prior significant head injury. He has history of encephalomalacia affecting the right frontal lobe that was present on prior imaging. Today he shows radiographic evidence of bifrontal contusions.  He additionally has small area of subdural hematoma along the skull base. He previously had a skull fracture which now shows evidence of extension of his fracture posteriorly on the right side through the frontal and parietal bones.     Clinical Impressions Dustin Ferrell presents to ED with facial bruising, headache, increased instability. Patient is unable to recall falling, and wife did not witness. 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 He lives at home with his wife in a [place] home; She reports he can perform most BADL INDly, including bathing, toileting, and dressing himself, although does need help w/ tasks requiring fine motor control, such as buttoning, zipping. Wife assist Dustin Ferrell with all IADL. He ambulates with rollator or cane at baseline. During today's evaluation, Dustin Ferrell is A&O x 4. He completes bed mobility without assistance. He does require assistance for donning socks. He requires min A +2 (to manage equipment) for safety. Patient ambulated 100 feet on level surface with 1 sitting rest break, requiring CGA-SUPV for safety. He ambulates with normal base of support, decreased step length, slower gait speed. Patient occasionally will mis-step/wobble but is able to recover. Wife states Dustin Ferrell is more unsteady than his baseline. Recommend ongoing OT while hospitalized. If Dustin Ferrell continues to appear to be at increased risk for falls, would recommend skilled care <3 hrs/day. If Dustin Ferrell is near his baseline level of fxl mobility,  however, wife would ideally like for patient to return home w/ HH OT.   If plan is discharge home, recommend the following:   A little help with walking and/or transfers;A little help with bathing/dressing/bathroom;Assistance with feeding;Assistance with cooking/housework;Direct supervision/assist for medications management;Assist for transportation;Supervision due to cognitive status;Direct supervision/assist for financial management     Functional Status Assessment   Patient has had a recent decline in their functional status and demonstrates the ability to make significant improvements in function in a reasonable and predictable amount of time.     Equipment Recommendations   Other (comment)     Recommendations for Other Services         Precautions/Restrictions   Precautions Precautions: Fall Restrictions Weight Bearing Restrictions Per Provider Order: No     Mobility Bed Mobility Overal bed mobility: Needs Assistance (Simultaneous filing. User may not have seen previous data.) Bed Mobility: Supine to Sit, Sit to Supine (Simultaneous filing. User may not have seen previous data.)     Supine to sit: Supervision Sit to supine: Supervision (Simultaneous filing. User may not have seen previous data.)   General bed mobility comments: Able to do without physical assist; required SUPV for safety 2/2 impulsivity (Simultaneous filing. User may not have seen previous data.)    Transfers Overall transfer level: Needs assistance (Simultaneous filing. User may not have seen previous data.) Equipment used: Rollator (4 wheels) (Simultaneous filing. User may not have seen previous data.) Transfers: Sit to/from Stand (Simultaneous filing. User may not have seen previous data.) Sit to Stand: Supervision (Simultaneous filing. User may not have seen previous data.)           General  transfer comment: SUPV 2/2 to impulsivity. Asked Dustin Ferrell to remain seated on bed while we  disconnected equipment, but he jumped up and tried to take off walking while we were still working on equipment (Designer, industrial/product. User may not have seen previous data.)      Balance Overall balance assessment: Needs assistance Sitting-balance support: Bilateral upper extremity supported Sitting balance-Leahy Scale: Good     Standing balance support: Bilateral upper extremity supported, During functional activity, Reliant on assistive device for balance Standing balance-Leahy Scale: Fair                             ADL either performed or assessed with clinical judgement   ADL Overall ADL's : Needs assistance/impaired                 Upper Body Dressing : Minimal assistance Upper Body Dressing Details (indicate cue type and reason): Required verbal and gestural cueing for threading arms through hospital gown Lower Body Dressing: Maximal assistance Lower Body Dressing Details (indicate cue type and reason): today required Max A for donning socks. per wife, can normally do this [employer]         Praxis         Pertinent Vitals/Pain Pain Assessment Pain Assessment: No/denies pain     Extremity/Trunk Assessment Upper Extremity Assessment Upper Extremity Assessment: Defer to OT evaluation   Lower Extremity Assessment Lower Extremity Assessment: Generalized weakness   Cervical / Trunk Assessment Cervical / Trunk Assessment: Kyphotic   Communication Communication Factors Affecting Communication: Difficulty expressing self   Cognition Arousal: Lethargic Behavior During Therapy: WFL for tasks assessed/performed Cognition: History of cognitive impairments             OT - Cognition Comments: Dustin Ferrell is oriented to time, place, situation. Per wife, Dustin Ferrell has had challenges with impulse control, planning, safety awareness, executive functioning since TBI almost 4 decades ago, condition worsening in recent  years.                 Following commands: Intact       Cueing  General Comments      some bruising noted along anterior face/head; no swelling in BLE   Exercises Other Exercises Other Exercises: Educ w/ wife re: caregiver stress, managing Dustin Ferrell's needs, DC recs (Simultaneous filing. User may not have seen previous data.)   Shoulder Instructions      Home Living Family/patient expects to be discharged to:: Private residence Living Arrangements: Spouse/significant other Available Help at Discharge: Family Type of Home: House Home Access: Stairs to enter Secretary/administrator of Steps: 1   Home Layout: One level               Home Equipment: Rollator (4 wheels);Cane - single point          Prior Functioning/Environment Prior Level of Function : Needs assist             Mobility Comments: Dustin Ferrell ambulates with rollator or cane, although wife reports that he often forgets to use them. ADLs Comments: Able to toilet, shower w/ Mod I. Requires assist with dressing -- buttons, zippers, etc. Does not drive.    OT Problem List: Decreased strength;Decreased activity tolerance;Decreased coordination;Decreased cognition;Impaired balance (sitting and/or standing)   OT Treatment/Interventions:  Self-care/ADL training;DME and/or AE instruction;Therapeutic activities;Balance training;Therapeutic exercise;Cognitive remediation/compensation;Patient/family education      OT Goals(Current goals can be found in the care plan section)   Acute Rehab OT Goals Patient Stated Goal: to be safer OT Goal Formulation: With patient Time For Goal Achievement: 05/18/24 Potential to Achieve Goals: Good ADL Goals Dustin Ferrell Will Perform Grooming: with modified independence;standing Dustin Ferrell Will Perform Lower Body Dressing: with set-up;with supervision;sitting/lateral leans Dustin Ferrell Will Transfer to Toilet: with supervision;regular height toilet;ambulating (w/ LRAD)   OT Frequency:  Min 2X/week     Co-evaluation   Reason for Co-Treatment: Necessary to address cognition/behavior during functional activity;For patient/therapist safety;To address functional/ADL transfers Dustin Ferrell goals addressed during session: Mobility/safety with mobility;Balance;Proper use of DME;Strengthening/ROM OT goals addressed during session: ADL's and self-care;Proper use of Adaptive equipment and DME;Strengthening/ROM      AM-PAC OT 6 Clicks Daily Activity     Outcome Measure Help from another person eating meals?: A Little Help from another person taking care of personal grooming?: A Lot Help from another person toileting, which includes using toliet, bedpan, or urinal?: A Lot Help from another person bathing (including washing, rinsing, drying)?: A Lot Help from another person to put on and taking off regular upper body clothing?: A Lot Help from another person to put on and taking off regular lower body clothing?: A Lot 6 Click Score: 13   End of Session Equipment Utilized During Treatment: Gait belt;Rollator (4 wheels)  Activity Tolerance: Patient tolerated treatment well Patient left: in bed;with nursing/sitter in room;with call bell/phone within reach;with family/visitor present  OT Visit Diagnosis: Unsteadiness on feet (R26.81);Other abnormalities of gait and mobility (R26.89);Repeated falls (R29.6);History of falling (Z91.81);Muscle weakness (generalized) (M62.81)                Time: 1150-1230 OT Time Calculation (min): 40 min Charges:  OT General Charges $OT Visit: 1 Visit OT Evaluation $OT Eval Moderate Complexity: 1 Mod OT Treatments $Self Care/Home Management : 8-22 mins Suzen Hock, PhD, MS, OTR/L 05/04/24, 2:03 PM

## 2024-05-05 ENCOUNTER — Inpatient Hospital Stay

## 2024-05-05 DIAGNOSIS — Z794 Long term (current) use of insulin: Secondary | ICD-10-CM

## 2024-05-05 DIAGNOSIS — E1159 Type 2 diabetes mellitus with other circulatory complications: Secondary | ICD-10-CM

## 2024-05-05 DIAGNOSIS — R569 Unspecified convulsions: Secondary | ICD-10-CM

## 2024-05-05 DIAGNOSIS — F0390 Unspecified dementia without behavioral disturbance: Secondary | ICD-10-CM | POA: Diagnosis not present

## 2024-05-05 DIAGNOSIS — I152 Hypertension secondary to endocrine disorders: Secondary | ICD-10-CM

## 2024-05-05 DIAGNOSIS — Z87820 Personal history of traumatic brain injury: Secondary | ICD-10-CM | POA: Diagnosis not present

## 2024-05-05 DIAGNOSIS — S065XAA Traumatic subdural hemorrhage with loss of consciousness status unknown, initial encounter: Secondary | ICD-10-CM

## 2024-05-05 LAB — CBC WITH DIFFERENTIAL/PLATELET
Abs Immature Granulocytes: 0.06 K/uL (ref 0.00–0.07)
Basophils Absolute: 0.1 K/uL (ref 0.0–0.1)
Basophils Relative: 0 %
Eosinophils Absolute: 0 K/uL (ref 0.0–0.5)
Eosinophils Relative: 0 %
HCT: 42.6 % (ref 39.0–52.0)
Hemoglobin: 14.8 g/dL (ref 13.0–17.0)
Immature Granulocytes: 0 %
Lymphocytes Relative: 20 %
Lymphs Abs: 2.7 K/uL (ref 0.7–4.0)
MCH: 30.6 pg (ref 26.0–34.0)
MCHC: 34.7 g/dL (ref 30.0–36.0)
MCV: 88 fL (ref 80.0–100.0)
Monocytes Absolute: 1.9 K/uL — ABNORMAL HIGH (ref 0.1–1.0)
Monocytes Relative: 14 %
Neutro Abs: 8.8 K/uL — ABNORMAL HIGH (ref 1.7–7.7)
Neutrophils Relative %: 66 %
Platelets: 281 K/uL (ref 150–400)
RBC: 4.84 MIL/uL (ref 4.22–5.81)
RDW: 14 % (ref 11.5–15.5)
WBC: 13.5 K/uL — ABNORMAL HIGH (ref 4.0–10.5)
nRBC: 0 % (ref 0.0–0.2)

## 2024-05-05 LAB — COMPREHENSIVE METABOLIC PANEL WITH GFR
ALT: 29 U/L (ref 0–44)
AST: 51 U/L — ABNORMAL HIGH (ref 15–41)
Albumin: 3.9 g/dL (ref 3.5–5.0)
Alkaline Phosphatase: 81 U/L (ref 38–126)
Anion gap: 17 — ABNORMAL HIGH (ref 5–15)
BUN: 33 mg/dL — ABNORMAL HIGH (ref 8–23)
CO2: 20 mmol/L — ABNORMAL LOW (ref 22–32)
Calcium: 9.7 mg/dL (ref 8.9–10.3)
Chloride: 99 mmol/L (ref 98–111)
Creatinine, Ser: 1.06 mg/dL (ref 0.61–1.24)
GFR, Estimated: >60 mL/min (ref >60)
Glucose, Bld: 204 mg/dL — ABNORMAL HIGH (ref 70–99)
Potassium: 4.3 mmol/L (ref 3.5–5.1)
Sodium: 136 mmol/L (ref 135–145)
Total Bilirubin: 0.6 mg/dL (ref 0.0–1.2)
Total Protein: 7.2 g/dL (ref 6.5–8.1)

## 2024-05-05 LAB — GLUCOSE, CAPILLARY
Glucose-Capillary: 231 mg/dL — ABNORMAL HIGH (ref 70–99)
Glucose-Capillary: 217 mg/dL — ABNORMAL HIGH (ref 70–99)
Glucose-Capillary: 197 mg/dL — ABNORMAL HIGH (ref 70–99)
Glucose-Capillary: 157 mg/dL — ABNORMAL HIGH (ref 70–99)

## 2024-05-05 MED ORDER — PANTOPRAZOLE SODIUM 40 MG PO TBEC
40.0000 mg | DELAYED_RELEASE_TABLET | Freq: Every day | ORAL | Status: DC
  Administered 2024-05-05 – 2024-05-12 (×6): 40 mg via ORAL
  Filled 2024-05-05 (×8): qty 1

## 2024-05-05 MED ORDER — HYDRALAZINE HCL 20 MG/ML IJ SOLN
10.0000 mg | INTRAMUSCULAR | Status: DC | PRN
  Administered 2024-05-05 (×2): 10 mg via INTRAVENOUS
  Filled 2024-05-05 (×2): qty 1

## 2024-05-05 MED ORDER — AMLODIPINE BESYLATE 10 MG PO TABS
10.0000 mg | ORAL_TABLET | Freq: Every day | ORAL | Status: DC
  Administered 2024-05-06 – 2024-05-10 (×4): 10 mg via ORAL
  Filled 2024-05-05 (×5): qty 1

## 2024-05-05 MED ORDER — INSULIN GLARGINE 100 UNIT/ML ~~LOC~~ SOLN
5.0000 [IU] | Freq: Every day | SUBCUTANEOUS | Status: DC
  Administered 2024-05-05 – 2024-05-08 (×4): 5 [IU] via SUBCUTANEOUS
  Filled 2024-05-05 (×5): qty 0.05

## 2024-05-05 NOTE — Progress Notes (Signed)
 PROGRESS NOTE    Dustin Ferrell  FMW:[id number] DOB: 15-Jul-1945 DOA: 05/03/2024 PCP: [name], MD  Chief Complaint  Patient presents with   [facility] Course:  Dustin Ferrell is an 78 year old with hypertension, hyperlipidemia, type 2 diabetes, TBI with residual cognitive impairment, dementia, who presented to the hospital because his wife noticed bruises on his face and increasing confusion. At baseline the patient lives with his wife.  He is largely independent of ADLs.  His wife does report that over the last 5 years he has become increasingly reclusive and unsteady on his feet.  He occasionally uses a walker when out of the home, but typically ambulates without 1 at home. On arrival in the ED he was found to have intracranial hemorrhage with subdural hematoma, subarachnoid hemorrhage.  Neurosurgery was consulted who recommended no surgical intervention.  Repeat head CT was stable.  Subjective: This morning patient is very drowsy, most recent Haldol  given at 2350 yesterday. Patient's wife is at bedside.  The patient is very agitated, constantly trying to get out of bed and pull out his mittens.  He does not verbalize or open his eyes.   Objective: Vitals:   05/05/24 0143 05/05/24 0244 05/05/24 0400 05/05/24 0756  BP: (!) 183/87 (!) 179/89 (!) 150/67 (!) 166/91  Pulse:   92 (!) 106  Resp:   (!) 21 17  Temp:   99.4 F (37.4 C)   TempSrc:   Oral   SpO2:   100% 94%  Weight:      Height:        Intake/Output Summary (Last 24 hours) at 05/05/2024 0801 Last data filed at 05/05/2024 0655 Gross per 24 hour  Intake 50 ml  Output 700 ml  Net -650 ml   Filed Weights   05/03/24 1228  Weight: 74.8 kg    Examination: General exam: Intermittently agitated, eyes closed. Respiratory system: No work of breathing, symmetric chest wall expansion Cardiovascular system: S1 & S2 heard, RRR.  Gastrointestinal system: Abdomen is nondistended, soft and nontender.   Neuro: Intermittently agitated, eyes closed, tries to sit up, mittens in place, needs to be frequently redirected.  Assessment & Plan:  Principal Problem:   Intracranial hemorrhage (HCC) Active Problems:   Diabetes mellitus due to underlying condition without complication, with long-term current use of insulin  (HCC)   Dementia without behavioral disturbance (HCC)   Hypertension associated with diabetes (HCC)   Hx of traumatic brain injury   Seizures (HCC)   Intraparenchymal hemorrhage of brain (HCC)   Subdural hematoma (HCC)    Intracranial hemorrhage - Bifrontal contusions, subdural hematoma along skull base.  Likely traumatic - Repeat head CT stable. - Given increasing somnolence today repeat head CT has been ordered by neurosurgery.  Somnolence is likely multifactorial, did receive Haldol  and Seroquel  around midnight. - Continue monitoring closely.  Frequent reorientation, delirium precautions. - Presently no plan for surgical intervention.  Dr. Katrina of neurosurgery still following - PT/OT, patient will certainly need some rehab/SNF at [place] - Heparin  subcu for DVT prophylaxis per Dr. Katrina  Leukocytosis - New leukocytosis today 13.5, check head CT above to ensure this is not reactive - No other signs or symptoms of infection.  No fever. - High risk aspiration.  Continue to monitor airway closely.  Consider CXR if he begins coughing or showing symptoms - Repeat CBC in a.m.  Hypertensive urgency - Increased amlodipine  today.  Continue with lisinopril .  Continue to  titrate as needed - As needed labetalol  and hydralazine   Type 2 diabetes - Sign scale insulin , titrate as needed - Start long-acting 5 units glargine today - Most recent hemoglobin A1c available for review was 6 months ago, 9.3% - Have ordered repeat hemoglobin A1c for a.m.  Dementia with behavioral disturbance -At baseline patient lives with his wife, he is largely ambulatory around the home.  His wife  endorses progressive decline over the last 5 years where he has become increasingly reclusive and unsteady.  He otherwise is conversational at baseline, and performs most ADLs - As needed IM Haldol .  Scheduled nightly Seroquel  - Low-dose Seroquel  added  History of TBI Anxiety Seizure disorder - Continue home meds  Body mass index is 27.46 kg/m. - Outpatient follow up for lifestyle modification and risk factor management   DVT prophylaxis: Heparin , okay by neurosurgery   Code Status: Limited: Do not attempt resuscitation (DNR) -DNR-LIMITED -Do Not Intubate/DNI  Disposition: Inpatient pending clinical resolution, eventual discharge to SNF  Consultants:  Treatment Team:  Consulting Physician: Clois Fret, MD  Procedures:    Antimicrobials:  Anti-infectives (From admission, onward)    None       Data Reviewed: I have personally reviewed following labs and imaging studies CBC: Recent Labs  Lab 05/03/24 1257 05/04/24 0527  WBC 8.8 6.7  NEUTROABS 6.6  --   HGB 12.2* 11.1*  HCT 35.9* 33.4*  MCV 90.2 92.0  PLT 177 155   Basic Metabolic Panel: Recent Labs  Lab 05/03/24 1257 05/03/24 2158 05/04/24 0527 05/04/24 0935  NA 135 134* 135 137  K 4.3  --  3.9  --   CL 97*  --  101  --   CO2 24  --  23  --   GLUCOSE 181*  --  108*  --   BUN 30*  --  26*  --   CREATININE 0.87  --  0.70  --   CALCIUM  9.6  --  8.9  --   MG  --  1.9  --   --    GFR: Estimated Creatinine Clearance: 69.6 mL/min (by C-G formula based on SCr of 0.7 mg/dL). Liver Function Tests: Recent Labs  Lab 05/03/24 1257  AST 23  ALT 23  ALKPHOS 61  BILITOT 0.6  PROT 7.2  ALBUMIN 4.3   CBG: Recent Labs  Lab 05/04/24 1745 05/04/24 1936 05/04/24 2355 05/05/24 0600 05/05/24 0750  GLUCAP 311* 268* 175* 231* 217*    No results found for this or any previous visit (from the past 240 hours).   Radiology Studies: CT HEAD WO CONTRAST Result Date: 05/03/2024 EXAM: CT HEAD WITHOUT  CONTRAST 05/03/2024 08:36:30 PM TECHNIQUE: CT of the head was performed without the administration of intravenous contrast. Automated exposure control, iterative reconstruction, and/or weight based adjustment of the mA/kV was utilized to reduce the radiation dose to as low as reasonably achievable. COMPARISON: Earlier today. CLINICAL HISTORY: Stroke, follow up. FINDINGS: BRAIN AND VENTRICLES: Extensive hemorrhagic contusions involving the anterior frontal lobes bilaterally, unchanged. Surrounding edema within the frontal lobes, also stable. Adjacent subarachnoid hemorrhage. Right frontal subdural hematoma measures 3 mm in thickness, stable. Contusion in the left inferior temporal lobe along the floor of the left middle cranial fossa is stable. No evidence of acute infarct. No hydrocephalus. No midline shift. ORBITS: No acute abnormality. SINUSES: No acute abnormality. SOFT TISSUES AND SKULL: Right frontal skull fracture extending toward the high right parietal bone is unchanged. No acute soft tissue abnormality. IMPRESSION:  1. Extensive hemorrhagic contusions involving the anterior frontal lobes bilaterally with surrounding edema, unchanged. 2. Adjacent subarachnoid hemorrhage, unchanged. 3. Stable right frontal subdural hematoma measuring 3 mm in thickness. 4. Stable contusion in the left inferior temporal lobe and on the floor of the left middle cranial fossa. 5. Unchanged right frontal skull fracture extending toward the high right parietal bone. 6. No hydrocephalus or midline shift. Electronically signed by: Dustin Crease MD 05/03/2024 08:48 PM EST RP Workstation: [id number]   CT Head Wo Contrast Result Date: 05/03/2024 EXAM: CT HEAD WITHOUT CONTRAST 05/03/2024 01:46:12 PM TECHNIQUE: CT of the head was performed without the administration of intravenous contrast. Automated exposure control, iterative reconstruction, and/or weight based adjustment of the mA/kV was utilized to reduce the radiation dose to as low  as reasonably achievable. COMPARISON: MRI brain 08/17/2023 and CT head 09/30/2014. CLINICAL HISTORY: Head trauma, minor (Age >= 65y). FINDINGS: BRAIN AND VENTRICLES: Extensive hemorrhagic contusions involving the right greater than left bilateral anterior frontal lobes with adjacent foci of sulcal subarachnoid hemorrhage. There is adjacent parenchymal hypoattenuation likely representing edema. There is a more confluent 4.2 x 2.6 x 1.3 cm (AP x TR CC) extraaxial hemorrhage along the planum sphenoidale and within the right olfactory groove. There is preexisting encephalomalacia and gliosis involving the right frontal lobe. There are additional foci of hemorrhagic contusion and subarachnoid hemorrhage involving the inferior left temporal lobe/ left middle cranial fossa. There is a thin significant right temporal convexity subdural hemorrhage . No acute territorial infarction. No midline shift. There are mild layering hemorrhages within the bilateral occipital horns. No hydrocephalus. ORBITS: Post surgical fixation to the right superior orbit. Chronic right inferior orbital wall fracture. SINUSES: There is scattered opacification of the bilateral ethmoid air cells. Post surgical changes to the right frontal sinus. Chronic right anterior lamina papyracea deformity. Chronic bony nasal septal deformity. SOFT TISSUES AND SKULL: Evidence of prior craniofacial trauma. Post surgical fixation to the right superior orbit and frontal bone. Chronic asymmetry and deformity of the right cribriform plate. There is suspicion for increased posterior propagation of a right nondisplaced linear skull fracture into the parietal bone which may represent superimposed acute skull fracture. No acute displaced or depressed calvarial fracture. Trace left mastoid effusion. No acute soft tissue abnormality. IMPRESSION: 1. Hemorrhagic contusions involving the right greater than left anterior frontal lobes with adjacent sulcal subarachnoid  hemorrhage and associated edema. 2. More confluent approximately 4 cm extraaxial hemorrhage along the planum sphenoidale and within the olfactory groove. 3. Mild layering intraventricular hemorrhage within the bilateral occipital horns also likely posttraumatic. 4. Suspicion for acute nondisplaced right parietal skull fracture, extending more posteriorly since 2016. 5. No acute facial bone fracture. 6. These findings were communicated to Dr. Levander by telephone at 2:38 PM. Electronically signed by: Dustin Ferrell 05/03/2024 03:40 PM EST RP Workstation: [id number]   CT Maxillofacial Wo Contrast Result Date: 05/03/2024 EXAM: CT HEAD WITHOUT CONTRAST 05/03/2024 01:46:12 PM TECHNIQUE: CT of the head was performed without the administration of intravenous contrast. Automated exposure control, iterative reconstruction, and/or weight based adjustment of the mA/kV was utilized to reduce the radiation dose to as low as reasonably achievable. COMPARISON: MRI brain 08/17/2023 and CT head 09/30/2014. CLINICAL HISTORY: Head trauma, minor (Age >= 65y). FINDINGS: BRAIN AND VENTRICLES: Extensive hemorrhagic contusions involving the right greater than left bilateral anterior frontal lobes with adjacent foci of sulcal subarachnoid hemorrhage. There is adjacent parenchymal hypoattenuation likely representing edema. There is a more confluent 4.2 x 2.6 x 1.3 cm (AP  x TR CC) extraaxial hemorrhage along the planum sphenoidale and within the right olfactory groove. There is preexisting encephalomalacia and gliosis involving the right frontal lobe. There are additional foci of hemorrhagic contusion and subarachnoid hemorrhage involving the inferior left temporal lobe/ left middle cranial fossa. There is a thin significant right temporal convexity subdural hemorrhage . No acute territorial infarction. No midline shift. There are mild layering hemorrhages within the bilateral occipital horns. No hydrocephalus. ORBITS: Post surgical fixation  to the right superior orbit. Chronic right inferior orbital wall fracture. SINUSES: There is scattered opacification of the bilateral ethmoid air cells. Post surgical changes to the right frontal sinus. Chronic right anterior lamina papyracea deformity. Chronic bony nasal septal deformity. SOFT TISSUES AND SKULL: Evidence of prior craniofacial trauma. Post surgical fixation to the right superior orbit and frontal bone. Chronic asymmetry and deformity of the right cribriform plate. There is suspicion for increased posterior propagation of a right nondisplaced linear skull fracture into the parietal bone which may represent superimposed acute skull fracture. No acute displaced or depressed calvarial fracture. Trace left mastoid effusion. No acute soft tissue abnormality. IMPRESSION: 1. Hemorrhagic contusions involving the right greater than left anterior frontal lobes with adjacent sulcal subarachnoid hemorrhage and associated edema. 2. More confluent approximately 4 cm extraaxial hemorrhage along the planum sphenoidale and within the olfactory groove. 3. Mild layering intraventricular hemorrhage within the bilateral occipital horns also likely posttraumatic. 4. Suspicion for acute nondisplaced right parietal skull fracture, extending more posteriorly since 2016. 5. No acute facial bone fracture. 6. These findings were communicated to Dr. Levander by telephone at 2:38 PM. Electronically signed by: Dustin Ferrell 05/03/2024 03:40 PM EST RP Workstation: [id number]   CT Cervical Spine Wo Contrast Result Date: 05/03/2024 EXAM: CT CERVICAL SPINE WITHOUT CONTRAST 05/03/2024 01:46:12 PM TECHNIQUE: CT of the cervical spine was performed without the administration of intravenous contrast. Multiplanar reformatted images are provided for review. Automated exposure control, iterative reconstruction, and/or weight based adjustment of the mA/kV was utilized to reduce the radiation dose to as low as reasonably achievable. COMPARISON:  None available. CLINICAL HISTORY: Neck trauma (Age >= 65y) FINDINGS: CERVICAL SPINE: BONES AND ALIGNMENT: No acute fracture or traumatic malalignment. 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 DEGENERATIVE CHANGES: There is multilevel intervertebral disc height loss most pronounced at C6-C7. There is fusion of the C3-C4 facet joints on the left. No high-grade spinal canal stenosis within the limitations of CT. There is multilevel uncovertebral spurring and facet arthropathy with varying degrees of neural foraminal stenosis bilaterally. SOFT TISSUES: No prevertebral soft tissue swelling. Bilateral maxillary sinus retention cysts versus polyps. There are aerated secretions within the piriform sinuses. IMPRESSION: 1. No acute fracture or traumatic malalignment of the cervical spine. Electronically signed by: Dustin Ferrell 05/03/2024 02:30 PM EST RP Workstation: [id number]   DG Chest Portable 1 View Result Date: 05/03/2024 EXAM: 1 VIEW(S) XRAY OF THE CHEST 05/03/2024 01:31:00 PM COMPARISON: 04/28/2017 bilateral rib fractures are again noted. CLINICAL HISTORY: chest pain FINDINGS: LUNGS AND PLEURA: No focal pulmonary opacity. No pleural effusion. No pneumothorax. HEART AND MEDIASTINUM: No acute abnormality of the cardiac and mediastinal silhouettes. BONES AND SOFT TISSUES: Bilateral rib fractures are again noted. IMPRESSION: 1. No acute cardiopulmonary process. Chronic bilateral rib fractures, unchanged from 04/28/2017. Electronically signed by: Dustin Seip MD 05/03/2024 01:54 PM EST RP Workstation: [id number]    Scheduled Meds:   stroke: early stages of recovery book   Does not apply Once   amLODipine   5 mg Oral  Daily   busPIRone   5 mg Oral BID   divalproex   500 mg Oral TID   heparin  injection (subcutaneous)  5,000 Units Subcutaneous Q8H   insulin  aspart  0-9 Units Subcutaneous Q6H   lisinopril   20 mg Oral Daily   pantoprazole  (PROTONIX ) IV  40 mg Intravenous QHS   QUEtiapine   25 mg Oral  QHS   sertraline   200 mg Oral Daily   sodium chloride  flush  3 mL Intravenous Q12H   Continuous Infusions:   LOS: 2 days  MDM: Patient is high risk for one or more organ failure.  They necessitate ongoing hospitalization for continued IV therapies and subsequent lab monitoring. Total time spent interpreting labs and vitals, reviewing the medical record, coordinating care amongst consultants and care team members, directly assessing and discussing care with the patient and/or family: 55 min Alfie Alderfer, DO [facility]  To contact the attending physician between 7A-7P please use Epic Chat. To contact the covering physician during after hours 7P-7A, please review Amion.  05/05/2024, 8:01 AM   *This document has been created with the assistance of dictation software. Please excuse typographical errors. *

## 2024-05-05 NOTE — Care Management Important Message (Signed)
 Important Message  Patient Details  Name: Dustin Ferrell MRN: [id number] Date of Birth: [birth year]   Important Message Given:  Yes - Medicare IM     Jashae Wiggs W, CMA 05/05/2024, 8:19 PM

## 2024-05-05 NOTE — Plan of Care (Signed)
  Problem: Education: Goal: Knowledge of disease or condition will improve Outcome: Progressing   Problem: Intracerebral Hemorrhage Tissue Perfusion: Goal: Complications of Intracerebral Hemorrhage will be minimized Outcome: Progressing   Problem: Coping: Goal: Will verbalize positive feelings about self Outcome: Progressing Goal: Will identify appropriate support needs Outcome: Progressing

## 2024-05-05 NOTE — Plan of Care (Signed)
  Problem: Education: Goal: Knowledge of disease or condition will improve Outcome: Progressing Goal: Knowledge of secondary prevention will improve (MUST DOCUMENT ALL) Outcome: Progressing Goal: Knowledge of patient specific risk factors will improve (DELETE if not current risk factor) Outcome: Progressing   Problem: Intracerebral Hemorrhage Tissue Perfusion: Goal: Complications of Intracerebral Hemorrhage will be minimized Outcome: Progressing   Problem: Coping: Goal: Will verbalize positive feelings about self Outcome: Progressing Goal: Will identify appropriate support needs Outcome: Progressing   Problem: Health Behavior/Discharge Planning: Goal: Ability to manage health-related needs will improve Outcome: Progressing Goal: Goals will be collaboratively established with patient/family Outcome: Progressing   Problem: Self-Care: Goal: Ability to participate in self-care as condition permits will improve Outcome: Progressing Goal: Verbalization of feelings and concerns over difficulty with self-care will improve Outcome: Progressing Goal: Ability to communicate needs accurately will improve Outcome: Progressing   Problem: Nutrition: Goal: Risk of aspiration will decrease Outcome: Progressing Goal: Dietary intake will improve Outcome: Progressing   Problem: Education: Goal: Ability to describe self-care measures that may prevent or decrease complications (Diabetes Survival Skills Education) will improve Outcome: Progressing Goal: Individualized Educational Video(s) Outcome: Progressing   Problem: Coping: Goal: Ability to adjust to condition or change in health will improve Outcome: Progressing   Problem: Fluid Volume: Goal: Ability to maintain a balanced intake and output will improve Outcome: Progressing   Problem: Health Behavior/Discharge Planning: Goal: Ability to identify and utilize available resources and services will improve Outcome: Progressing Goal:  Ability to manage health-related needs will improve Outcome: Progressing   Problem: Metabolic: Goal: Ability to maintain appropriate glucose levels will improve Outcome: Progressing   Problem: Nutritional: Goal: Maintenance of adequate nutrition will improve Outcome: Progressing Goal: Progress toward achieving an optimal weight will improve Outcome: Progressing   Problem: Skin Integrity: Goal: Risk for impaired skin integrity will decrease Outcome: Progressing   Problem: Tissue Perfusion: Goal: Adequacy of tissue perfusion will improve Outcome: Progressing   Problem: Education: Goal: Knowledge of General Education information will improve Description: Including pain rating scale, medication(s)/side effects and non-pharmacologic comfort measures Outcome: Progressing   Problem: Health Behavior/Discharge Planning: Goal: Ability to manage health-related needs will improve Outcome: Progressing   Problem: Clinical Measurements: Goal: Ability to maintain clinical measurements within normal limits will improve Outcome: Progressing Goal: Will remain free from infection Outcome: Progressing Goal: Diagnostic test results will improve Outcome: Progressing Goal: Respiratory complications will improve Outcome: Progressing Goal: Cardiovascular complication will be avoided Outcome: Progressing   Problem: Activity: Goal: Risk for activity intolerance will decrease Outcome: Progressing   Problem: Nutrition: Goal: Adequate nutrition will be maintained Outcome: Progressing   Problem: Coping: Goal: Level of anxiety will decrease Outcome: Progressing   Problem: Elimination: Goal: Will not experience complications related to bowel motility Outcome: Progressing Goal: Will not experience complications related to urinary retention Outcome: Progressing   Problem: Pain Managment: Goal: General experience of comfort will improve and/or be controlled Outcome: Progressing   Problem:  Safety: Goal: Ability to remain free from injury will improve Outcome: Progressing   Problem: Skin Integrity: Goal: Risk for impaired skin integrity will decrease Outcome: Progressing

## 2024-05-05 NOTE — Progress Notes (Signed)
 Neurosurgery Progress Note  History: JORI THRALL is here for traumatic brain injury  HD3: Agitated yesterday and overnight.  HD2: No issues overnight.  He still has a headache.  Otherwise he has no complaints  Physical Exam: Vitals:   05/05/24 0756 05/05/24 0900  BP: (!) 166/91 (!) 151/82  Pulse: (!) 106   Resp: 17   Temp:    SpO2: 94%    OE voice, regards, does not follow commands CNI  Strength:5/5 throughout BUE and BLE grossly - moving spontaneously  Data:  Other tests/results:  Sodium stable  Assessment/Plan:  SELIM DURDEN is here with traumatic brain injury with bifrontal contusions, subdural hematoma, and subarachnoid hemorrhage.  - mobilize - pain control - DVT prophylaxis  - PTOT - Maintain sodium above 135 and continue antiepileptic medication - Repeat CT today given worsened neurologic exam   [name] MD, [facility] Department of Neurosurgery

## 2024-05-06 ENCOUNTER — Inpatient Hospital Stay

## 2024-05-06 DIAGNOSIS — E1159 Type 2 diabetes mellitus with other circulatory complications: Secondary | ICD-10-CM | POA: Diagnosis not present

## 2024-05-06 DIAGNOSIS — I629 Nontraumatic intracranial hemorrhage, unspecified: Secondary | ICD-10-CM | POA: Diagnosis not present

## 2024-05-06 DIAGNOSIS — R569 Unspecified convulsions: Secondary | ICD-10-CM | POA: Diagnosis not present

## 2024-05-06 DIAGNOSIS — Z87820 Personal history of traumatic brain injury: Secondary | ICD-10-CM | POA: Diagnosis not present

## 2024-05-06 DIAGNOSIS — F0390 Unspecified dementia without behavioral disturbance: Secondary | ICD-10-CM | POA: Diagnosis not present

## 2024-05-06 LAB — GLUCOSE, CAPILLARY
Glucose-Capillary: 213 mg/dL — ABNORMAL HIGH (ref 70–99)
Glucose-Capillary: 224 mg/dL — ABNORMAL HIGH (ref 70–99)
Glucose-Capillary: 244 mg/dL — ABNORMAL HIGH (ref 70–99)
Glucose-Capillary: 250 mg/dL — ABNORMAL HIGH (ref 70–99)
Glucose-Capillary: 302 mg/dL — ABNORMAL HIGH (ref 70–99)

## 2024-05-06 LAB — CBC
HCT: 44.1 % (ref 39.0–52.0)
Hemoglobin: 15.5 g/dL (ref 13.0–17.0)
MCH: 30.9 pg (ref 26.0–34.0)
MCHC: 35.1 g/dL (ref 30.0–36.0)
MCV: 87.8 fL (ref 80.0–100.0)
Platelets: 289 K/uL (ref 150–400)
RBC: 5.02 MIL/uL (ref 4.22–5.81)
RDW: 14.2 % (ref 11.5–15.5)
WBC: 13.5 K/uL — ABNORMAL HIGH (ref 4.0–10.5)
nRBC: 0 % (ref 0.0–0.2)

## 2024-05-06 LAB — RESP PANEL BY RT-PCR (RSV, FLU A&B, COVID)  RVPGX2
Influenza A by PCR: NEGATIVE
Influenza B by PCR: NEGATIVE
Resp Syncytial Virus by PCR: NEGATIVE
SARS Coronavirus 2 by RT PCR: NEGATIVE

## 2024-05-06 LAB — COMPREHENSIVE METABOLIC PANEL WITH GFR
ALT: 26 U/L (ref 0–44)
AST: 38 U/L (ref 15–41)
Albumin: 3.9 g/dL (ref 3.5–5.0)
Alkaline Phosphatase: 76 U/L (ref 38–126)
Anion gap: 15 (ref 5–15)
BUN: 41 mg/dL — ABNORMAL HIGH (ref 8–23)
CO2: 22 mmol/L (ref 22–32)
Calcium: 9.8 mg/dL (ref 8.9–10.3)
Chloride: 104 mmol/L (ref 98–111)
Creatinine, Ser: 1.13 mg/dL (ref 0.61–1.24)
GFR, Estimated: >60 mL/min (ref >60)
Glucose, Bld: 243 mg/dL — ABNORMAL HIGH (ref 70–99)
Potassium: 4.5 mmol/L (ref 3.5–5.1)
Sodium: 141 mmol/L (ref 135–145)
Total Bilirubin: 0.7 mg/dL (ref 0.0–1.2)
Total Protein: 7.3 g/dL (ref 6.5–8.1)

## 2024-05-06 LAB — HEMOGLOBIN A1C
Hgb A1c MFr Bld: 8.4 % — ABNORMAL HIGH (ref 4.8–5.6)
Mean Plasma Glucose: 194 mg/dL

## 2024-05-06 MED ORDER — LORAZEPAM 2 MG/ML IJ SOLN
1.0000 mg | INTRAMUSCULAR | Status: AC | PRN
  Administered 2024-05-06 – 2024-05-07 (×2): 1 mg via INTRAVENOUS
  Filled 2024-05-06 (×3): qty 1

## 2024-05-06 MED ORDER — QUETIAPINE FUMARATE 25 MG PO TABS
25.0000 mg | ORAL_TABLET | ORAL | Status: DC
  Administered 2024-05-06 – 2024-05-07 (×2): 25 mg via ORAL
  Filled 2024-05-06 (×2): qty 1

## 2024-05-06 MED ORDER — LABETALOL HCL 5 MG/ML IV SOLN: 10.0000 mg | INTRAVENOUS | Status: DC | PRN

## 2024-05-06 MED ORDER — HYDRALAZINE HCL 20 MG/ML IJ SOLN: 10.0000 mg | Freq: Four times a day (QID) | INTRAMUSCULAR | Status: DC | PRN

## 2024-05-06 MED ORDER — LACTATED RINGERS IV SOLN: INTRAVENOUS | Status: AC

## 2024-05-06 MED ORDER — LISINOPRIL 20 MG PO TABS
40.0000 mg | ORAL_TABLET | Freq: Every day | ORAL | Status: DC
  Administered 2024-05-06 – 2024-05-07 (×2): 40 mg via ORAL
  Filled 2024-05-06 (×2): qty 2

## 2024-05-06 NOTE — Inpatient Diabetes Management (Signed)
 Inpatient Diabetes Program Recommendations  AACE/ADA: New Consensus Statement on Inpatient Glycemic Control   Target Ranges:  Prepandial:   less than 140 mg/dL      Peak postprandial:   less than 180 mg/dL (1-2 hours)      Critically ill patients:  140 - 180 mg/dL   Lab Results  Component Value Date   GLUCAP 224 (H) 05/06/2024   HGBA1C 7.4 (H) 07/11/2019    Latest Reference Range & Units 05/05/24 07:50 05/05/24 12:20 05/05/24 16:01 05/06/24 00:12 05/06/24 06:06 05/06/24 07:43  Glucose-Capillary 70 - 99 mg/dL 782 (H) 802 (H) 842 (H) 244 (H) 250 (H) 224 (H)   Review of Glycemic Control  Diabetes history: DM2  Outpatient Diabetes medications:  Tresiba  23 units daily  Januvia  100mg  daily   Current orders for Inpatient glycemic control:  Lantus  5 units daily  Novolog  0-9 units Q6HRS  Inpatient Diabetes Program Recommendations:   Please consider increasing Lantus  to 10 units daily.   Thanks,  Lavanda Search, RN, MSN, [facility]  Inpatient Diabetes Coordinator  Pager [phone] (8a-5p)

## 2024-05-06 NOTE — Plan of Care (Signed)
  Problem: Education: Goal: Knowledge of disease or condition will improve Outcome: Progressing Goal: Knowledge of secondary prevention will improve (MUST DOCUMENT ALL) Outcome: Progressing Goal: Knowledge of patient specific risk factors will improve (DELETE if not current risk factor) Outcome: Progressing   Problem: Intracerebral Hemorrhage Tissue Perfusion: Goal: Complications of Intracerebral Hemorrhage will be minimized Outcome: Progressing   Problem: Coping: Goal: Will verbalize positive feelings about self Outcome: Progressing Goal: Will identify appropriate support needs Outcome: Progressing   Problem: Health Behavior/Discharge Planning: Goal: Ability to manage health-related needs will improve Outcome: Progressing Goal: Goals will be collaboratively established with patient/family Outcome: Progressing   Problem: Self-Care: Goal: Ability to participate in self-care as condition permits will improve Outcome: Progressing Goal: Verbalization of feelings and concerns over difficulty with self-care will improve Outcome: Progressing Goal: Ability to communicate needs accurately will improve Outcome: Progressing   Problem: Nutrition: Goal: Risk of aspiration will decrease Outcome: Progressing Goal: Dietary intake will improve Outcome: Progressing   Problem: Education: Goal: Ability to describe self-care measures that may prevent or decrease complications (Diabetes Survival Skills Education) will improve Outcome: Progressing Goal: Individualized Educational Video(s) Outcome: Progressing   Problem: Coping: Goal: Ability to adjust to condition or change in health will improve Outcome: Progressing   Problem: Fluid Volume: Goal: Ability to maintain a balanced intake and output will improve Outcome: Progressing   Problem: Health Behavior/Discharge Planning: Goal: Ability to identify and utilize available resources and services will improve Outcome: Progressing Goal:  Ability to manage health-related needs will improve Outcome: Progressing   Problem: Metabolic: Goal: Ability to maintain appropriate glucose levels will improve Outcome: Progressing   Problem: Nutritional: Goal: Maintenance of adequate nutrition will improve Outcome: Progressing Goal: Progress toward achieving an optimal weight will improve Outcome: Progressing   Problem: Skin Integrity: Goal: Risk for impaired skin integrity will decrease Outcome: Progressing   Problem: Tissue Perfusion: Goal: Adequacy of tissue perfusion will improve Outcome: Progressing   Problem: Education: Goal: Knowledge of General Education information will improve Description: Including pain rating scale, medication(s)/side effects and non-pharmacologic comfort measures Outcome: Progressing   Problem: Health Behavior/Discharge Planning: Goal: Ability to manage health-related needs will improve Outcome: Progressing   Problem: Clinical Measurements: Goal: Ability to maintain clinical measurements within normal limits will improve Outcome: Progressing Goal: Will remain free from infection Outcome: Progressing Goal: Diagnostic test results will improve Outcome: Progressing Goal: Respiratory complications will improve Outcome: Progressing Goal: Cardiovascular complication will be avoided Outcome: Progressing   Problem: Activity: Goal: Risk for activity intolerance will decrease Outcome: Progressing   Problem: Nutrition: Goal: Adequate nutrition will be maintained Outcome: Progressing   Problem: Coping: Goal: Level of anxiety will decrease Outcome: Progressing   Problem: Elimination: Goal: Will not experience complications related to bowel motility Outcome: Progressing Goal: Will not experience complications related to urinary retention Outcome: Progressing   Problem: Pain Managment: Goal: General experience of comfort will improve and/or be controlled Outcome: Progressing   Problem:  Safety: Goal: Ability to remain free from injury will improve Outcome: Progressing   Problem: Skin Integrity: Goal: Risk for impaired skin integrity will decrease Outcome: Progressing

## 2024-05-06 NOTE — [place] FL2 (Addendum)
 [place]  MEDICAID FL2 LEVEL OF CARE FORM     IDENTIFICATION  Patient Name: Dustin Ferrell Birthdate: [birth year] Sex: male Admission Date (Current Location): 05/03/2024  [facility] and [place] Number:  Chiropodist and Address:  [facility], [address] [place], [place] [address]      Provider Number: [id number]  Attending Physician Name and Address:  Leesa Kast, DO  Relative Name and Phone Number:  Karver, Fadden (Daughter)  [phone]    Current Level of Care: Hospital Recommended Level of Care: Skilled Nursing Facility Prior Approval Number:    Date Approved/Denied:   PASRR Number:  [id number]   Discharge Plan: Home    Current Diagnoses: Patient Active Problem List   Diagnosis Date Noted   Seizures (HCC) 05/03/2024   Intracranial hemorrhage (HCC) 05/03/2024   Intraparenchymal hemorrhage of brain (HCC) 05/03/2024   Subdural hematoma (HCC) 05/03/2024   Iron deficiency anemia 07/16/2019   Adrenal nodule (HCC)- left 07/16/2019   Serum potassium elevated 07/16/2019   High risk medications (not anticoagulants) long-term use 07/16/2019   Elevated LDL cholesterol level 06/25/2018   Healthcare maintenance 12/13/2017   Encounter for screening for malignant neoplasm of respiratory organs 07/12/2017   Overgrown toenails 07/12/2017   Vitamin D  insufficiency 04/28/2017   Closed fracture of one rib of left side 04/28/2017   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) 04/28/2017   Diabetes mellitus due to underlying condition without complication, with long-term current use of insulin  (HCC) 04/06/2017   Elbow swelling, left 04/06/2017   Dementia without behavioral disturbance (HCC) 04/06/2017   Fatigue 04/06/2017   Hypertension associated with diabetes (HCC) 04/06/2017   Screening for AAA (abdominal aortic aneurysm) 04/06/2017   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) 10/15/2014   Neurological deficit, transient 10/15/2014   Obstructive sleep apnea- noncompliance w txmnt 10/15/2014   Syncope 10/03/2014   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   Colon polyps 07/07/2014   Hx of traumatic brain injury 07/07/2014    Orientation RESPIRATION BLADDER Height & Weight     Self, Time, Situation, Place    Continent Weight: 72.8 kg Height:  5' 5 (165.1 cm)  BEHAVIORAL SYMPTOMS/MOOD NEUROLOGICAL BOWEL NUTRITION STATUS      Continent Diet (heart healthy carb modified)  AMBULATORY STATUS COMMUNICATION OF NEEDS Skin   Extensive Assist Verbally                         Personal Care Assistance Level of Assistance  Bathing, Feeding, Dressing Bathing Assistance: Maximum assistance Feeding assistance: Limited assistance Dressing Assistance: Maximum assistance     Functional Limitations Info             SPECIAL CARE FACTORS FREQUENCY                       Contractures      Additional Factors Info  Code Status, Allergies Code Status Info: DNR Allergies Info: Metformin            Current Medications (05/06/2024):  This is the current hospital active medication list Current Facility-Administered Medications  Medication Dose Route Frequency Provider Last Rate Last Admin   acetaminophen  (TYLENOL ) tablet 650 mg  650 mg Oral Q4H PRN Khan, Ghalib, MD   650 mg at 05/06/24 9187   Or   acetaminophen  (TYLENOL ) 160 MG/5ML solution 650 mg  650 mg Per Tube Q4H PRN Fernand Prost, MD  Or   acetaminophen  (TYLENOL ) suppository 650 mg  650 mg Rectal Q4H PRN Khan, Ghalib, MD       amLODipine  (NORVASC ) tablet 10 mg  10 mg Oral Daily Dezii, Alexandra, DO   10 mg at 05/06/24 9188   busPIRone  (BUSPAR ) tablet 5 mg  5 mg Oral BID Ayiku, Bernard, MD   5 mg at 05/06/24 9187   divalproex  (DEPAKOTE ) DR tablet 500 mg  500 mg Oral TID Jens Durand, MD   500 mg at 05/06/24 9187   haloperidol  lactate (HALDOL ) injection 5 mg   5 mg Intramuscular Q6H PRN Jens Durand, MD   5 mg at 05/06/24 0203   hydrALAZINE  (APRESOLINE ) injection 10 mg  10 mg Intravenous Q6H PRN Dezii, Alexandra, DO       insulin  aspart (novoLOG ) injection 0-9 Units  0-9 Units Subcutaneous Q6H Khan, Ghalib, MD   3 Units at 05/06/24 9188   insulin  glargine (LANTUS ) injection 5 Units  5 Units Subcutaneous Daily Dezii, Alexandra, DO   5 Units at 05/06/24 1019   labetalol  (NORMODYNE ) injection 10 mg  10 mg Intravenous Q2H PRN Dezii, Alexandra, DO       lisinopril  (ZESTRIL ) tablet 40 mg  40 mg Oral Daily Dezii, Alexandra, DO   40 mg at 05/06/24 9187   LORazepam  (ATIVAN ) injection 1 mg  1 mg Intravenous Q4H PRN Duncan, Hazel V, MD   1 mg at 05/06/24 0209   pantoprazole  (PROTONIX ) EC tablet 40 mg  40 mg Oral QHS Hallaji, Sheema M, RPH   40 mg at 05/05/24 2157   QUEtiapine  (SEROQUEL ) tablet 25 mg  25 mg Oral QHS Jens Durand, MD   25 mg at 05/05/24 2157   senna-docusate (Senokot-S) tablet 1 tablet  1 tablet Oral QHS PRN Fernand Prost, MD       sertraline  (ZOLOFT ) tablet 200 mg  200 mg Oral Daily Ayiku, Bernard, MD   200 mg at 05/06/24 9187   sodium chloride  flush (NS) 0.9 % injection 3 mL  3 mL Intravenous Q12H Khan, Ghalib, MD   3 mL at 05/06/24 1020     Discharge Medications: Please see discharge summary for a list of discharge medications.  Relevant Imaging Results:  Relevant Lab Results:   Additional Information SSN    [id number]  Dalia GORMAN Fuse, RN

## 2024-05-06 NOTE — Plan of Care (Signed)
  Problem: Intracerebral Hemorrhage Tissue Perfusion: Goal: Complications of Intracerebral Hemorrhage will be minimized Outcome: Progressing   Problem: Coping: Goal: Will identify appropriate support needs Outcome: Progressing   Problem: Self-Care: Goal: Ability to communicate needs accurately will improve Outcome: Progressing   Problem: Education: Goal: Individualized Educational Video(s) Outcome: Progressing

## 2024-05-06 NOTE — Progress Notes (Signed)
 Neurosurgery Progress Note  History: Dustin Ferrell is here for traumatic brain injury  HD4: Has continued agitation.   HD3: Agitated yesterday and overnight.  HD2: No issues overnight.  He still has a headache.  Otherwise he has no complaints  Physical Exam: Vitals:   05/06/24 1145 05/06/24 1145  BP: 107/86 108/86  Pulse: 76 79  Resp: 18 18  Temp: 98 F (36.7 C) 98 F (36.7 C)  SpO2: 98% 96%   OE voice, regards, does not follow commands Identifies daughter. CNI  Strength:Moves all extremities purposefully  Data:  Other tests/results:  Sodium stable  Assessment/Plan:  Dustin Ferrell is here with traumatic brain injury with bifrontal contusions, subdural hematoma, and subarachnoid hemorrhage.  - mobilize - pain control - DVT prophylaxis - held for now, ok to start back tomorrow  - PTOT - Maintain sodium above 135 and continue antiepileptic medication - EEG and possibly MRI if no improvement in sensorium - May need NGT - discussed with wife and daughters.   [name] MD, [facility] Department of Neurosurgery

## 2024-05-06 NOTE — TOC Initial Note (Addendum)
 Transition of Care [facility]) - Initial/Assessment Note    Patient Details  Name: Dustin Ferrell MRN: [id number] Date of Birth: 1945-06-14  Transition of Care [facility]) CM/SW Contact:    Dalia GORMAN Fuse, RN Phone Number: 05/06/2024, 1:25 PM  Clinical Narrative:                 TOC met with the patient, spouse, and 2 daughters at the bedside. One of his daughters is a Database administrator. The patient is from home with his wife. Prior to this admission he was able to complete ADLs with assistance from his wife.He has DME walker and cane in the home and grab bars on the toilet and shower. Therapy recs are for STR. The family is opened to [facility], [place], [place], and [place] TOC explained that planned communities typically save STR beds for their residence, occasionally they will offer beds based on availability. The family will also consider [facility] and [facility]. His daughter has a colleage at TL and she is going to reachout to see if she can provide any guidance or influence.  FL2 sent out to referenced facilities. TOC will continue to follow.    The following facilities declined to offer: [facility] Preferred SNF  Declined Out of network -- [address] [place] [address] [phone] --  [facility] Preferred SNF  Declined Insurance not in network -- [address] [place] [address] [phone] --  [facility] PREFERRED SNF  Declined Insurance not in network -- [address], [place] [address] [phone] --  [facility] PREFERRED SNF/ALF  Declined Facility full -- [address], [place] [address] [phone] --  [facility] Preferred SNF  Declined Not eligible -- [address], [place] [address] [phone] --  [facility] SN[facility]F               Patient Goals and CMS Choice            Expected Discharge Plan and Services                                              Prior Living Arrangements/Services                       Activities of Daily Living   ADL Screening (condition at time of admission) Independently performs ADLs?: No Does the patient have a NEW difficulty with bathing/dressing/toileting/self-feeding that is expected to last >3 days?: Yes (Initiates electronic notice to provider for possible OT consult) Does the patient have a NEW difficulty with getting in/out of bed, walking, or climbing stairs that is expected to last >3 days?: Yes (Initiates electronic notice to provider for possible PT consult) Does the patient have a NEW difficulty with communication that is expected to last >3 days?: No Is the patient deaf or have difficulty hearing?: Yes Does the patient have difficulty seeing, even when wearing glasses/contacts?: Yes Does the patient have difficulty concentrating, remembering, or making decisions?: Yes  Permission Sought/Granted                  Emotional Assessment              Admission diagnosis:  Intracranial hemorrhage (HCC) [I62.9]  Acute encephalopathy [G93.40] Closed fracture of parietal bone, initial encounter (HCC) [S02.0XXA] Patient Active Problem List   Diagnosis Date Noted   Seizures (HCC) 05/03/2024   Intracranial hemorrhage (HCC) 05/03/2024   Intraparenchymal hemorrhage of brain (HCC) 05/03/2024   Subdural hematoma (HCC) 05/03/2024   Iron deficiency anemia 07/16/2019   Adrenal nodule (HCC)- left 07/16/2019   Serum potassium elevated 07/16/2019   High risk medications (not anticoagulants) long-term use 07/16/2019   Elevated LDL cholesterol level 06/25/2018   Healthcare maintenance 12/13/2017   Encounter for screening for malignant neoplasm of respiratory organs 07/12/2017   Overgrown toenails 07/12/2017   Vitamin D  insufficiency  04/28/2017   Closed fracture of one rib of left side 04/28/2017   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) 04/28/2017   Diabetes mellitus due to underlying condition without complication, with long-term current use of insulin  (HCC) 04/06/2017   Elbow swelling, left 04/06/2017   Dementia without behavioral disturbance (HCC) 04/06/2017   Fatigue 04/06/2017   Hypertension associated with diabetes (HCC) 04/06/2017   Screening for AAA (abdominal aortic aneurysm) 04/06/2017   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) 10/15/2014   Neurological deficit, transient 10/15/2014   Obstructive sleep apnea- noncompliance w txmnt 10/15/2014   Syncope 10/03/2014   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   Colon polyps 07/07/2014   Hx of traumatic brain injury 07/07/2014   PCP:  [name], MD Pharmacy:   [id number] [place], [place] - [address] AT [facility] [address] [place] [address] Phone: [phone] Fax: [phone]     Social Drivers of Health (SDOH) Social History: SDOH Screenings   Food Insecurity: No Food Insecurity (05/04/2024)  Housing: Low Risk  (05/04/2024)  Transportation Needs: No Transportation Needs (05/04/2024)  Utilities: Not At Risk (05/04/2024)  Depression (PHQ2-9): Low Risk  (07/11/2019)  Financial Resource Strain: Low Risk  (05/03/2024)   Received from [facility]  Social Connections: Unknown (05/04/2024)  Tobacco Use: High Risk (05/03/2024)   SDOH Interventions:     Readmission Risk Interventions     No data to display

## 2024-05-06 NOTE — Progress Notes (Signed)
 PT Cancellation Note  Patient Details Name: Dustin Ferrell MRN: [id number] DOB: 07-15-1945   Cancelled Treatment:    Reason Eval/Treat Not Completed: Medical issues which prohibited therapy. Per EMR pt with progressing ICH from imaging with elevated BP readings at this time. Contraindication for physical therapy currently. PT to continue to follow and re-attempt when medically appropriate.    Dorina HERO. Fairly IV, PT, DPT Physical Therapist- [place]  [facility] 05/06/2024, 11:17 AM

## 2024-05-06 NOTE — Progress Notes (Signed)
 PROGRESS NOTE    Dustin Ferrell  FMW:[id number] DOB: Apr 28, 1946 DOA: 05/03/2024 PCP: [name], MD  Chief Complaint  Patient presents with   [facility] Course:  Dustin Ferrell is an 78 year old with hypertension, hyperlipidemia, type 2 diabetes, TBI with residual cognitive impairment, dementia, who presented to the hospital because his wife noticed bruises on his face and increasing confusion. At baseline the patient lives with his wife.  He is largely independent of ADLs.  His wife does report that over the last 5 years he has become increasingly reclusive and unsteady on his feet.  He occasionally uses a walker when out of the home, but typically ambulates without 1 at home. On arrival in the ED he was found to have intracranial hemorrhage with subdural hematoma, subarachnoid hemorrhage.  Neurosurgery was consulted who recommended no surgical intervention.  Repeat head CT was stable. Patient had worsening mentation in the on 12/7 to repeat head CT was performed.  Repeat head CT shows mildly increased bleeding.  Subcu heparin  discontinued.  Continue to work at better blood pressure control  Subjective: Fever this morning 100.4, CXR ordered but unremarkable. On evaluation patient is slightly more alert than yesterday, does attempt to follow some commands.  Does not verbalize for me but family members and bedside RN reports that he has been intermittently answering questions for them.  I was able to update his wife, Leita, and his daughters Barnie and Olam at bedside. We discussed the overall clinical picture and general prognosis.  We discussed that presently Dustin Ferrell is not sustaining enough nutrition orally.  We discussed alternatives like NG tube or PEG tube.  At this time family agrees they are not interested in pursuing artificial nutrition.  They would like to give him more time for increased alertness. We discussed continuing to try small bites of food  when patient is more alert and I have reviewed aspiration precautions with him. We also discussed Haldol  which may be causing worsening agitation and this has been discontinued in favor of Ativan  alone.   Objective: Vitals:   05/06/24 0500 05/06/24 0759 05/06/24 0900 05/06/24 1145  BP:  (!) 141/105  107/86  Pulse:  94  76  Resp:  20  18  Temp:  (!) 100.4 F (38 C) 99.5 F (37.5 C) 98 F (36.7 C)  TempSrc:  Axillary Axillary Axillary  SpO2:  100%  98%  Weight: 72.8 kg     Height:        Intake/Output Summary (Last 24 hours) at 05/06/2024 1336 Last data filed at 05/06/2024 9061 Gross per 24 hour  Intake 0 ml  Output 100 ml  Net -100 ml   Filed Weights   05/03/24 1228 05/06/24 0500  Weight: 74.8 kg 72.8 kg    Examination: General exam: Eyes remain closed, intermittently agitated  Respiratory system: No work of breathing, symmetric chest wall expansion Cardiovascular system: S1 & S2 heard, RRR.  Gastrointestinal system: Abdomen is nondistended, soft and nontender.  Neuro: Intermittently agitated, eyes remain closed, more easily redirectable today, intermittently following some commands  Assessment & Plan:  Principal Problem:   Intracranial hemorrhage (HCC) Active Problems:   Diabetes mellitus due to underlying condition without complication, with long-term current use of insulin  (HCC)   Dementia without behavioral disturbance (HCC)   Hypertension associated with diabetes (HCC)   Hx of traumatic brain injury   Seizures (HCC)   Intraparenchymal hemorrhage of brain (HCC)  Subdural hematoma (HCC)    Intracranial hemorrhage - Bifrontal contusions, subdural hematoma along skull base.  Likely traumatic - Nasal head CT stable - Increasing somnolence on 12/7, repeat CT does reveal some increased subdural collection over the left frontal parietal convexity and more prominent subarachnoid hemorrhage over the posterior parietal and occipital lobes bilaterally, otherwise scan is  stable. - Was on heparin  subcu, have discontinued this. - More alert today, but remains far from baseline - EEG ordered, changed to stat.  Will follow closely.  If no evidence of seizure activity may pursue brain MRI to better evaluate the extent of damage - Have discontinued Haldol  in case this is adding to his agitation - Low-dose Ativan  1 mg every 4 hours remains available - Continue with scheduled Seroquel  nightly, have changed the time on this to 2000 to reduce morning somnolence - If agitation persist throughout the day can consider low-dose 12.5 Seroquel  in a.m. - Continue close monitoring, sitter at bedside - Frequent reorientation and delirium precautions.  Encourage family presence at bedside - Presently no plan for surgical intervention.  Dr. Katrina of neurosurgery still following - Patient unable to work with PT/OT today but will eventually need SNF/rehab if not hospice. - Continue with aspiration precautions, one-to-one feeding.  Avoid NG or PEG placement for now and allow additional time for patient to recover.  Patient agitation, high risk for removing NG tube once it is placed.  Would like to keep patient out of mittens if possible. - Labs do show some signs of dehydration today.  We will start LR  Leukocytosis and fever - New leukocytosis 12/7, thought to be reactive but patient now has new fever 100.4 - No obvious signs or symptoms of infection.  He remains high risk for aspiration - CXR negative for acute findings, may be early aspiration pneumonitis - Respiratory viral panel has been ordered - Will obtain urinalysis, patient is unable to verbalize if he is having lower urinary tract symptoms -Continue aspiration precautions - Continue following CBC and fever curve  Hypertensive urgency Uncontrolled hypertension - Patient received PRN labetalol  multiple times yesterday - Have maxed dose amlodipine  and lisinopril .  Will add additional agents as needed.  Currently  appears to be responding well to these medication changes. - Continue with as needed labetalol  and hydralazine . - SBP goal less than 140  Type 2 diabetes - CBGs remain above goal - Continue with sliding scale, will hold off on increasing glargine given poor p.o. intake - Most recent hemoglobin A1c available for review was 6 months ago, 9.3% - Repeat hemoglobin A1c still pending  Dementia with behavioral disturbance -At baseline patient lives with his wife, he is largely ambulatory around the home.  His wife endorses progressive decline over the last 5 years where he has become increasingly reclusive and unsteady.  He otherwise is conversational at baseline, and performs most ADLs - Daily Seroquel  at 2000, discontinue Haldol  - As needed Ativan  - Consider additional low-dose Seroquel  in a.m. if needed  History of TBI Anxiety Seizure disorder - Continue home meds  Body mass index is 26.71 kg/m. - Outpatient follow up for lifestyle modification and risk factor management   DVT prophylaxis: SCDs only for now   Code Status: Limited: Do not attempt resuscitation (DNR) -DNR-LIMITED -Do Not Intubate/DNI  Disposition: Inpatient pending clinical resolution, eventually DC to SNF  Consultants:  Treatment Team:  Consulting Physician: Clois Fret, MD  Procedures:    Antimicrobials:  Anti-infectives (From admission, onward)  None       Data Reviewed: I have personally reviewed following labs and imaging studies CBC: Recent Labs  Lab 05/03/24 1257 05/04/24 0527 05/05/24 1119 05/06/24 0626  WBC 8.8 6.7 13.5* 13.5*  NEUTROABS 6.6  --  8.8*  --   HGB 12.2* 11.1* 14.8 15.5  HCT 35.9* 33.4* 42.6 44.1  MCV 90.2 92.0 88.0 87.8  PLT 177 155 281 289   Basic Metabolic Panel: Recent Labs  Lab 05/03/24 1257 05/03/24 2158 05/04/24 0527 05/04/24 0935 05/05/24 0851 05/06/24 0626  NA 135 134* 135 137 136 141  K 4.3  --  3.9  --  4.3 4.5  CL 97*  --  101  --  99 104   CO2 24  --  23  --  20* 22  GLUCOSE 181*  --  108*  --  204* 243*  BUN 30*  --  26*  --  33* 41*  CREATININE 0.87  --  0.70  --  1.06 1.13  CALCIUM  9.6  --  8.9  --  9.7 9.8  MG  --  1.9  --   --   --   --    GFR: Estimated Creatinine Clearance: 45.4 mL/min (by C-G formula based on SCr of 1.13 mg/dL). Liver Function Tests: Recent Labs  Lab 05/03/24 1257 05/05/24 0851 05/06/24 0626  AST 23 51* 38  ALT 23 29 26   ALKPHOS 61 81 76  BILITOT 0.6 0.6 0.7  PROT 7.2 7.2 7.3  ALBUMIN 4.3 3.9 3.9   CBG: Recent Labs  Lab 05/05/24 1601 05/06/24 0012 05/06/24 0606 05/06/24 0743 05/06/24 1329  GLUCAP 157* 244* 250* 224* 213*    No results found for this or any previous visit (from the past 240 hours).   Radiology Studies: DG Chest Port 1 View Result Date: 05/06/2024 EXAM: 1 VIEW(S) XRAY OF THE CHEST 05/06/2024 07:50:00 AM COMPARISON: 05/03/2024 CLINICAL HISTORY: Aspiration into airway FINDINGS: LUNGS AND PLEURA: Low lung volume. No focal pulmonary opacity. No pleural effusion. No pneumothorax. HEART AND MEDIASTINUM: No acute abnormality of the cardiac and mediastinal silhouettes. BONES AND SOFT TISSUES: Old healed left posterolateral rib fractures. No acute osseous abnormality. IMPRESSION: 1. No acute findings. 2. Low lung volume. Electronically signed by: Evalene Coho MD 05/06/2024 08:52 AM EST RP Workstation: [id number]   CT HEAD WO CONTRAST ( ) Result Date: 05/05/2024 EXAM: CT HEAD WITHOUT CONTRAST 05/05/2024 02:26:00 PM TECHNIQUE: CT of the head was performed without the administration of intravenous contrast. Automated exposure control, iterative reconstruction, and/or weight based adjustment of the mA/kV was utilized to reduce the radiation dose to as low as reasonably achievable. COMPARISON: 05/03/2024 CLINICAL HISTORY: Head trauma, minor (Age >= 65y) FINDINGS: BRAIN AND VENTRICLES: Stable hemorrhagic contusions in bilateral frontal lobes with stable surrounding edema and mass  effect on the anterior falx. Stable right subdural hematoma measuring up to 3 mm. Areas of subarachnoid hemorrhage over the posterior parietal and occipital lobes bilaterally are more prominent than on the prior study. The subdural collection over the left frontal and parietal convexity has increased slightly, now measuring 4.5 mm maximally. No evidence of acute infarct. No hydrocephalus. No midline shift. ORBITS: No acute abnormality. SINUSES: Paranasal sinus mucosal thickening. SOFT TISSUES AND SKULL: No acute soft tissue abnormality. Stable right frontal calvarial fracture. Calcific atherosclerosis. IMPRESSION: 1. Stable hemorrhagic contusions in bilateral frontal lobes with surrounding edema and mass effect on anterior falx. 2. Increased subdural collection over the left frontal and parietal convexity, now measuring 4.5  mm maximally. 3. More prominent subarachnoid hemorrhage over the posterior parietal and occipital lobes bilaterally compared to the prior study. 4. Stable right subdural hematoma measuring up to 3 mm. 5. Stable right frontal calvarial fracture. Electronically signed by: Lonni Necessary MD 05/05/2024 02:41 PM EST RP Workstation: [id number]    Scheduled Meds:  amLODipine   10 mg Oral Daily   busPIRone   5 mg Oral BID   divalproex   500 mg Oral TID   insulin  aspart  0-9 Units Subcutaneous Q6H   insulin  glargine  5 Units Subcutaneous Daily   lisinopril   40 mg Oral Daily   pantoprazole   40 mg Oral QHS   QUEtiapine   25 mg Oral QHS   sertraline   200 mg Oral Daily   sodium chloride  flush  3 mL Intravenous Q12H   Continuous Infusions:   LOS: 3 days  MDM: Patient is high risk for one or more organ failure.  They necessitate ongoing hospitalization for continued IV therapies and subsequent lab monitoring. Total time spent interpreting labs and vitals, reviewing the medical record, coordinating care amongst consultants and care team members, directly assessing and discussing care with the  patient and/or family: 55 min Bracen Schum, DO [facility]  To contact the attending physician between 7A-7P please use Epic Chat. To contact the covering physician during after hours 7P-7A, please review Amion.  05/06/2024, 1:36 PM   *This document has been created with the assistance of dictation software. Please excuse typographical errors. *

## 2024-05-06 NOTE — Progress Notes (Signed)
   Brief Progress Note   _____________________________________________________________________________________________________________  Patient Name: Dustin Ferrell Patient DOB: 06-25-1945 Date: @TODAY @      Data: Received request from MD to follow up on EEG completion status.    Action: EPIC secure chat with Neurology team and Music Therapist.    Response:  EEG technician plans to complete EEG first thing in the morning.   _____________________________________________________________________________________________________________  [facility] RN Expeditor Ewin Rehberg, Corean Lobstein Please contact us  directly via secure chat (search for [facility]) or by calling us  at [phone] [facility]).

## 2024-05-07 ENCOUNTER — Inpatient Hospital Stay

## 2024-05-07 LAB — CBC WITH DIFFERENTIAL/PLATELET
Abs Immature Granulocytes: 0.05 K/uL (ref 0.00–0.07)
Basophils Absolute: 0 K/uL (ref 0.0–0.1)
Basophils Relative: 0 %
Eosinophils Absolute: 0 K/uL (ref 0.0–0.5)
Eosinophils Relative: 0 %
HCT: 42.9 % (ref 39.0–52.0)
Hemoglobin: 14 g/dL (ref 13.0–17.0)
Immature Granulocytes: 0 %
Lymphocytes Relative: 11 %
Lymphs Abs: 1.3 K/uL (ref 0.7–4.0)
MCH: 30.4 pg (ref 26.0–34.0)
MCHC: 32.6 g/dL (ref 30.0–36.0)
MCV: 93.1 fL (ref 80.0–100.0)
Monocytes Absolute: 1.5 K/uL — ABNORMAL HIGH (ref 0.1–1.0)
Monocytes Relative: 12 %
Neutro Abs: 9.7 K/uL — ABNORMAL HIGH (ref 1.7–7.7)
Neutrophils Relative %: 77 %
Platelets: 297 K/uL (ref 150–400)
RBC: 4.61 MIL/uL (ref 4.22–5.81)
RDW: 14.4 % (ref 11.5–15.5)
WBC: 12.6 K/uL — ABNORMAL HIGH (ref 4.0–10.5)
nRBC: 0 % (ref 0.0–0.2)

## 2024-05-07 LAB — AMMONIA: Ammonia: 45 umol/L — ABNORMAL HIGH (ref 9–35)

## 2024-05-07 LAB — RESPIRATORY PANEL BY PCR

## 2024-05-07 LAB — COMPREHENSIVE METABOLIC PANEL WITH GFR
ALT: 32 U/L (ref 0–44)
AST: 44 U/L — ABNORMAL HIGH (ref 15–41)
Albumin: 3.7 g/dL (ref 3.5–5.0)
Alkaline Phosphatase: 76 U/L (ref 38–126)
Anion gap: 17 — ABNORMAL HIGH (ref 5–15)
BUN: 65 mg/dL — ABNORMAL HIGH (ref 8–23)
CO2: 23 mmol/L (ref 22–32)
Calcium: 9.8 mg/dL (ref 8.9–10.3)
Chloride: 106 mmol/L (ref 98–111)
Creatinine, Ser: 1.57 mg/dL — ABNORMAL HIGH (ref 0.61–1.24)
GFR, Estimated: 44 mL/min — ABNORMAL LOW (ref >60)
Glucose, Bld: 192 mg/dL — ABNORMAL HIGH (ref 70–99)
Potassium: 3.9 mmol/L (ref 3.5–5.1)
Sodium: 146 mmol/L — ABNORMAL HIGH (ref 135–145)
Total Bilirubin: 0.6 mg/dL (ref 0.0–1.2)
Total Protein: 7.1 g/dL (ref 6.5–8.1)

## 2024-05-07 LAB — GLUCOSE, CAPILLARY
Glucose-Capillary: 286 mg/dL — ABNORMAL HIGH (ref 70–99)
Glucose-Capillary: 188 mg/dL — ABNORMAL HIGH (ref 70–99)
Glucose-Capillary: 191 mg/dL — ABNORMAL HIGH (ref 70–99)
Glucose-Capillary: 372 mg/dL — ABNORMAL HIGH (ref 70–99)

## 2024-05-07 MED ORDER — TAMSULOSIN HCL 0.4 MG PO CAPS
0.8000 mg | ORAL_CAPSULE | Freq: Every day | ORAL | Status: DC
  Administered 2024-05-07 – 2024-05-13 (×6): 0.8 mg via ORAL
  Filled 2024-05-07 (×6): qty 2

## 2024-05-07 MED ORDER — SODIUM CHLORIDE 0.9% FLUSH
10.0000 mL | Freq: Two times a day (BID) | INTRAVENOUS | Status: DC
  Administered 2024-05-07: 15 mL
  Administered 2024-05-08 – 2024-05-10 (×3): 10 mL
  Administered 2024-05-10: 20 mL
  Administered 2024-05-11 – 2024-05-14 (×7): 10 mL

## 2024-05-07 MED ORDER — LACTATED RINGERS IV SOLN: INTRAVENOUS | Status: AC

## 2024-05-07 MED ORDER — LACTULOSE 10 GM/15ML PO SOLN
20.0000 g | Freq: Three times a day (TID) | ORAL | Status: DC
  Administered 2024-05-07 (×2): 20 g via ORAL
  Filled 2024-05-07 (×4): qty 30

## 2024-05-07 MED ORDER — SODIUM CHLORIDE 0.9% FLUSH: 10.0000 mL | INTRAVENOUS | Status: DC | PRN

## 2024-05-07 NOTE — Progress Notes (Signed)
 Physical Therapy Treatment Patient Details Name: Dustin Ferrell MRN: [id number] DOB: 02-11-1946 Today's Date: 05/07/2024   History of Present Illness Mr. Intrieri is a pleasant 78 y.o. male with a minor head injury on top of prior significant head injury. He has history of encephalomalacia affecting the right frontal lobe that was present on prior imaging. Today he shows radiographic evidence of bifrontal contusions.  He additionally has small area of subdural hematoma along the skull base. He previously had a skull fracture which now shows evidence of extension of his fracture posteriorly on the right side through the frontal and parietal bones.    PT Comments  Pt was long sitting in bed, alert, upon arrival. Pt is only truly oriented to self and has inconsistent cognition presentation throughout session.Supportive spouse and daughter at bedside with safety sitter in place. Resting tremors noted with HR 124bpm. MD cleared pt to participate. Pt's spouse/daughter requested pt attempt OOB to [facility] to have BM since unsuccessful on bedpan. Pt requires much more extensive assistance to achieve EOB sitting and then to stand. Author feels its due to poor motor planning and cognition > versus strength or physical deficits. Pt unable to consistent;y follow commands to safely perform any activity away form EOB. HR elevated to 150bpm with stand pivot to EOB form BSC. Highly recommend +2-3 assist for any/all OOB. He remains far from his baseline and will greatly benefit from continued skilled PT at DC to maximize independence and safety with all ADLs. Acute P{T will continue to follow and progress as able.    If plan is discharge home, recommend the following: Two people to help with walking and/or transfers;Two people to help with bathing/dressing/bathroom;Assistance with cooking/housework;Assistance with feeding;Direct supervision/assist for medications management;Direct supervision/assist for financial  management;Assist for transportation;Help with stairs or ramp for entrance;Supervision due to cognitive status     Equipment Recommendations  Other (comment) (Defer to next level of care)       Precautions / Restrictions Precautions Precautions: Fall Recall of Precautions/Restrictions: Intact Restrictions Weight Bearing Restrictions Per Provider Order: No     Mobility  Bed Mobility Overal bed mobility: Needs Assistance Bed Mobility: Supine to Sit, Sit to Supine  Supine to sit: Mod assist, Max assist, Used rails, HOB elevated Sit to supine: Max assist, HOB elevated, Used rails, Total assist, +2 for physical assistance, +2 for safety/equipment General bed mobility comments: Pt required much more extensiove assistance to exit bed and then later to return to bed form EOB. pt was unable to consistently follow commands and has poor overall awareness of situation or awareness of safety concerns    Transfers Overall transfer level: Needs assistance Equipment used: 2 person hand held assist, Rolling walker (2 wheels) Transfers: Sit to/from Stand, Bed to chair/wheelchair/BSC Sit to Stand: Mod assist, Max assist, From elevated surface Stand pivot transfers: Max assist, Total assist, +2 physical assistance, +2 safety/equipment, From elevated surface  General transfer comment: Pt has tremors throughout session even at rest . resting HR 124bpm that elevated as high as 150bpm with transfers back to bed from [facility]. Session severely limited by pt's inability to follow commands consistently. he stood 1 x to RW but was unsafe to attempt transfer to [facility] due holding RW off floor and requiring max assist to prevent posterior LOB. Author elected to trasnfer pt to/from [facility] without AD and BUE support + knee blocking. mod-max of one to get to [facility] however +2 max assist to return to bed form BSC due to pt's inability  to let go of BSC during transfer. RN tech had to manually remove pt's grip from [employer] during transfer to  safely return to EOB.    Ambulation/Gait  General Gait Details: unable/unsafe to attemnpt    Balance Overall balance assessment: Needs assistance Sitting-balance support: Feet supported, Bilateral upper extremity supported Sitting balance-Leahy Scale: Fair Sitting balance - Comments: posterior LOB while seated EOB. required constant vcs/tcs throughout   Standing balance support: Bilateral upper extremity supported, During functional activity, Reliant on assistive device for balance Standing balance-Leahy Scale: Poor Standing balance comment: Pt remains at high risk of falls. poor awareness and poor ability to follow commands throughout. +2 assistance to return to bed form BSC      Communication Communication Communication: Impaired Factors Affecting Communication: Difficulty expressing self;Reduced clarity of speech  Cognition Arousal: Alert Behavior During Therapy: Anxious, Restless   PT - Cognitive impairments: History of cognitive impairments    PT - Cognition Comments: Pt is alert upon arrival. He greets chartered loss adjuster however is only truely oriented to self. He needs increased time and constantTC/VCing for task requested of him. Family requesated pt attempt to have BM on BSC. Following commands: Impaired Following commands impaired: Follows one step commands inconsistently    Cueing Cueing Techniques: Verbal cues, Tactile cues         Pertinent Vitals/Pain Pain Assessment Pain Assessment: No/denies pain     PT Goals (current goals can now be found in the care plan section) Acute Rehab PT Goals Patient Stated Goal: none stated. family hopeful for LTC placement after STR Progress towards PT goals: Progressing toward goals    Frequency    Min 1X/week       Co-evaluation     PT goals addressed during session: Mobility/safety with mobility;Balance;Proper use of DME;Strengthening/ROM        AM-PAC PT 6 Clicks Mobility   Outcome Measure  Help needed turning from  your back to your side while in a flat bed without using bedrails?: A Lot Help needed moving from lying on your back to sitting on the side of a flat bed without using bedrails?: Total Help needed moving to and from a bed to a chair (including a wheelchair)?: Total Help needed standing up from a chair using your arms (e.g., wheelchair or bedside chair)?: Total Help needed to walk in hospital room?: Total Help needed climbing 3-5 steps with a railing? : Total 6 Click Score: 7    End of Session   Activity Tolerance: Other (comment) (severly limited by cognition and poor ability to follow commands consistently) Patient left: in bed;with call bell/phone within reach;with bed alarm set;with family/visitor present;with nursing/sitter in room Nurse Communication: Mobility status PT Visit Diagnosis: Other abnormalities of gait and mobility (R26.89);Unsteadiness on feet (R26.81)     Time: 8669-8650 PT Time Calculation (min) (ACUTE ONLY): 19 min  Charges:    $Therapeutic Activity: 8-22 mins PT General Charges $$ ACUTE PT VISIT: 1 Visit                    [name] Essex PTA 05/07/24, 4:23 PM

## 2024-05-07 NOTE — Progress Notes (Signed)
 PROGRESS NOTE    Dustin Ferrell  FMW:[id number] DOB: 08-11-1945 DOA: 05/03/2024 PCP: [name], MD  Chief Complaint  Patient presents with   [facility] Course:  Dustin Ferrell is an 78 year old with hypertension, hyperlipidemia, type 2 diabetes, TBI with residual cognitive impairment, dementia, who presented to the hospital because his wife noticed bruises on his face and increasing confusion. At baseline the patient lives with his wife.  He is largely independent of ADLs.  His wife does report that over the last 5 years he has become increasingly reclusive and unsteady on his feet.  He occasionally uses a walker when out of the home, but typically ambulates without 1 at home. On arrival in the ED he was found to have intracranial hemorrhage with subdural hematoma, subarachnoid hemorrhage.  Neurosurgery was consulted who recommended no surgical intervention.  Repeat head CT was stable. Patient had worsening mentation in the on 12/7 to repeat head CT was performed.  Repeat head CT shows mildly increased bleeding.  Subcu heparin  discontinued. By evaluation on 12/9 patient was much more alert, he is conversant and following commands.  Working with physical therapy well.  EEG performed which is negative for epileptiform activity but does reveal some diffuse slowing consistent with encephalopathy.  Subjective: On evaluation the patient is sitting upright in bed on bedpan trying to have a bowel movement.  His wife and daughter, Dustin Ferrell, are at bedside. Patient denies any acute pain.  He is alert and oriented to his loved ones, otherwise struggles to answer some questions.  He is following commands  I was able to update his daughter, Dustin Ferrell, on the phone. Objective: Vitals:   05/07/24 0127 05/07/24 0453 05/07/24 0734 05/07/24 1213  BP: (!) 109/53 133/78 (P) 136/85 128/83  Pulse: 87 87 (P) 84 65  Resp: 18 19    Temp: 98.7 F (37.1 C) 98.7 F (37.1 C)  99.3 F (37.4 C)   TempSrc:    Oral  SpO2: 100% 98%    Weight:  68.9 kg    Height:        Intake/Output Summary (Last 24 hours) at 05/07/2024 1442 Last data filed at 05/07/2024 1300 Gross per 24 hour  Intake 584.6 ml  Output 800 ml  Net -215.4 ml   Filed Weights   05/06/24 0500 05/06/24 2005 05/07/24 0453  Weight: 72.8 kg 68.9 kg 68.9 kg    Examination: General exam: No acute distress Respiratory system: No work of breathing, symmetric chest wall expansion Cardiovascular system: S1 & S2 heard, RRR.  Gastrointestinal system: Abdomen is nondistended, soft and nontender.  Neuro: Awake, alert, following commands.  Appropriately names his daughter and wife in the room.  Requires some prompting to answer other questions  Assessment & Plan:  Principal Problem:   Intracranial hemorrhage (HCC) Active Problems:   Diabetes mellitus due to underlying condition without complication, with long-term current use of insulin  (HCC)   Dementia without behavioral disturbance (HCC)   Hypertension associated with diabetes (HCC)   Hx of traumatic brain injury   Seizures (HCC)   Intraparenchymal hemorrhage of brain (HCC)   Subdural hematoma (HCC)    Intracranial hemorrhage - Bifrontal contusions, subdural hematoma along skull base.  Likely traumatic - Initial head CT stable - Increasing somnolence on 12/7, repeat CT does reveal some increased subdural collection over the left frontal parietal convexity and more prominent subarachnoid hemorrhage over the posterior parietal and occipital lobes bilaterally, otherwise scan is  stable. - Was on heparin  subcu, have discontinued this. - EEG 12/9 no seizures or epileptiform discharges, generalized cerebral dysfunction consistent with encephalopathy - Mild elevation in ammonia, doubt this is contributing significantly but has started on lactulose  for constipation.  Improvement. - Family denies that patient has had recent access to alcohol, previously was a heavier drinker  in the past - Continue with scheduled Seroquel  at 2000, has had greater success with this timing - Continue with low-dose Ativan  as needed - Avoid Haldol  - Continue close monitoring, sitter at bedside when family not present - Frequent reorientation and delirium precautions.  Encourage family presence at bedside - Presently no plan for surgical intervention.  Dr. Katrina of neurosurgery still following - Patient has had significant improvement in his alertness today.  Anticipate he will work well with physical therapy.  Will be pursuing SNF at DC - Continue with aspiration precautions, one-to-one feeding.    Leukocytosis and fever - New leukocytosis 12/7, fever 100.4 on 12/8.  No further fever.  Leukocytosis resolving now.  May have been reactive - No obvious signs or symptoms of infection. - UA was ordered but has not been obtained, have asked RN - CXR negative for acute findings, may be early aspiration pneumonitis - RVP negative - Continue aspiration precautions - Continue following CBC and fever curve  Urinary retention - Patient having difficulty spontaneously voiding - Flomax  added - 500 cc obtained on Foley in and out this morning - Continue bladder scanning every 4, straight cath if over 350 cc.  If he requires another straight cath we will place Foley catheter for retention. - Treating constipation which may assist  Hypertensive urgency Uncontrolled hypertension - Much better controlled now on max dose amlodipine  and lisinopril  - Continue to follow blood pressure closely titrate as needed - Continue with as needed labetalol  and hydralazine . - SBP goal less than 140  Type 2 diabetes, uncontrolled - CBGs remain above goal - Continue with sliding scale, titrate as needed.  If p.o. intake becomes more consistent we will increase basal dosing - HgbA1c 8.4%  Dementia with behavioral disturbance -At baseline patient lives with his wife, he is largely ambulatory around the  home.  His wife endorses progressive decline over the last 5 years where he has become increasingly reclusive and unsteady.  He otherwise is conversational at baseline, and performs most ADLs - Daily Seroquel  at 2000, discontinue Haldol  - As needed Ativan  - Consider additional low-dose Seroquel  in a.m. if needed  History of TBI Anxiety Seizure disorder - Continue home meds  Body mass index is 25.28 kg/m. - Outpatient follow up for lifestyle modification and risk factor management   DVT prophylaxis: SCDs only for now   Code Status: Limited: Do not attempt resuscitation (DNR) -DNR-LIMITED -Do Not Intubate/DNI  Disposition: Inpatient pending clinical resolution, eventually DC to SNF  Consultants:  Treatment Team:  Consulting Physician: Clois Fret, MD  Procedures:    Antimicrobials:  Anti-infectives (From admission, onward)    None       Data Reviewed: I have personally reviewed following labs and imaging studies CBC: Recent Labs  Lab 05/03/24 1257 05/04/24 0527 05/05/24 1119 05/06/24 0626 05/07/24 0807  WBC 8.8 6.7 13.5* 13.5* 12.6*  NEUTROABS 6.6  --  8.8*  --  9.7*  HGB 12.2* 11.1* 14.8 15.5 14.0  HCT 35.9* 33.4* 42.6 44.1 42.9  MCV 90.2 92.0 88.0 87.8 93.1  PLT 177 155 281 289 297   Basic Metabolic Panel: Recent Labs  Lab  05/03/24 1257 05/03/24 2158 05/04/24 0527 05/04/24 0935 05/05/24 0851 05/06/24 0626 05/07/24 0807  NA 135 134* 135 137 136 141 146*  K 4.3  --  3.9  --  4.3 4.5 3.9  CL 97*  --  101  --  99 104 106  CO2 24  --  23  --  20* 22 23  GLUCOSE 181*  --  108*  --  204* 243* 192*  BUN 30*  --  26*  --  33* 41* 65*  CREATININE 0.87  --  0.70  --  1.06 1.13 1.57*  CALCIUM  9.6  --  8.9  --  9.7 9.8 9.8  MG  --  1.9  --   --   --   --   --    GFR: Estimated Creatinine Clearance: 32.6 mL/min (A) (by C-G formula based on SCr of 1.57 mg/dL (H)). Liver Function Tests: Recent Labs  Lab 05/03/24 1257 05/05/24 0851 05/06/24 0626  05/07/24 0807  AST 23 51* 38 44*  ALT 23 29 26  32  ALKPHOS 61 81 76 76  BILITOT 0.6 0.6 0.7 0.6  PROT 7.2 7.2 7.3 7.1  ALBUMIN 4.3 3.9 3.9 3.7   CBG: Recent Labs  Lab 05/06/24 1329 05/06/24 1951 05/07/24 0113 05/07/24 0739 05/07/24 1226  GLUCAP 213* 302* 286* 188* 191*    Recent Results (from the past 240 hours)  Resp panel by RT-PCR (RSV, Flu A&B, Covid) Anterior Nasal Swab     Status: None   Collection Time: 05/06/24  3:26 PM   Specimen: Anterior Nasal Swab  Result Value Ref Range Status   SARS Coronavirus 2 by RT PCR NEGATIVE NEGATIVE Final    Comment: (NOTE) SARS-CoV-2 target nucleic acids are NOT DETECTED.  The SARS-CoV-2 RNA is generally detectable in upper respiratory specimens during the acute phase of infection. The lowest concentration of SARS-CoV-2 viral copies this assay can detect is 138 copies/mL. A negative result does not preclude SARS-Cov-2 infection and should not be used as the sole basis for treatment or other patient management decisions. A negative result may occur with  improper specimen collection/handling, submission of specimen other than nasopharyngeal swab, presence of viral mutation(s) within the areas targeted by this assay, and inadequate number of viral copies(<138 copies/mL). A negative result must be combined with clinical observations, patient history, and epidemiological information. The expected result is Negative.  Fact Sheet for Patients:  bloggercourse.com  Fact Sheet for Healthcare Providers:  seriousbroker.it  This test is no t yet approved or cleared by the [place]  FDA and  has been authorized for detection and/or diagnosis of SARS-CoV-2 by FDA under an Emergency Use Authorization (EUA). This EUA will remain  in effect (meaning this test can be used) for the duration of the COVID-19 declaration under Section 564(b)(1) of the Act, 21 U.S.C.section 360bbb-3(b)(1),  unless the authorization is terminated  or revoked sooner.       Influenza A by PCR NEGATIVE NEGATIVE Final   Influenza B by PCR NEGATIVE NEGATIVE Final    Comment: (NOTE) The Xpert Xpress SARS-CoV-2/FLU/RSV plus assay is intended as an aid in the diagnosis of influenza from Nasopharyngeal swab specimens and should not be used as a sole basis for treatment. Nasal washings and aspirates are unacceptable for Xpert Xpress SARS-CoV-2/FLU/RSV testing.  Fact Sheet for Patients: bloggercourse.com  Fact Sheet for Healthcare Providers: seriousbroker.it  This test is not yet approved or cleared by the [place]  FDA and has been authorized for detection  and/or diagnosis of SARS-CoV-2 by FDA under an Emergency Use Authorization (EUA). This EUA will remain in effect (meaning this test can be used) for the duration of the COVID-19 declaration under Section 564(b)(1) of the Act, 21 U.S.C. section 360bbb-3(b)(1), unless the authorization is terminated or revoked.     Resp Syncytial Virus by PCR NEGATIVE NEGATIVE Final    Comment: (NOTE) Fact Sheet for Patients: bloggercourse.com  Fact Sheet for Healthcare Providers: seriousbroker.it  This test is not yet approved or cleared by the [place]  FDA and has been authorized for detection and/or diagnosis of SARS-CoV-2 by FDA under an Emergency Use Authorization (EUA). This EUA will remain in effect (meaning this test can be used) for the duration of the COVID-19 declaration under Section 564(b)(1) of the Act, 21 U.S.C. section 360bbb-3(b)(1), unless the authorization is terminated or revoked.  Performed at [facility], [address] [place], [place] [address]   Respiratory (~20 pathogens) panel by PCR     Status: None   Collection Time: 05/06/24  3:26 PM   Specimen: Nasopharyngeal Swab; Respiratory  Result Value Ref  Range Status   Adenovirus NOT DETECTED NOT DETECTED Final   Coronavirus 229E NOT DETECTED NOT DETECTED Final    Comment: (NOTE) The Coronavirus on the Respiratory Panel, DOES NOT test for the novel  Coronavirus (2019 nCoV)    Coronavirus HKU1 NOT DETECTED NOT DETECTED Final   Coronavirus NL63 NOT DETECTED NOT DETECTED Final   Coronavirus OC43 NOT DETECTED NOT DETECTED Final   Metapneumovirus NOT DETECTED NOT DETECTED Final   Rhinovirus / Enterovirus NOT DETECTED NOT DETECTED Final   Influenza A NOT DETECTED NOT DETECTED Final   Influenza B NOT DETECTED NOT DETECTED Final   Parainfluenza Virus 1 NOT DETECTED NOT DETECTED Final   Parainfluenza Virus 2 NOT DETECTED NOT DETECTED Final   Parainfluenza Virus 3 NOT DETECTED NOT DETECTED Final   Parainfluenza Virus 4 NOT DETECTED NOT DETECTED Final   Respiratory Syncytial Virus NOT DETECTED NOT DETECTED Final   Bordetella pertussis NOT DETECTED NOT DETECTED Final   Bordetella Parapertussis NOT DETECTED NOT DETECTED Final   Chlamydophila pneumoniae NOT DETECTED NOT DETECTED Final   Mycoplasma pneumoniae NOT DETECTED NOT DETECTED Final    Comment: Performed at [facility], [address] [place], [place] [address]     Radiology Studies: EEG adult Result Date: 05/07/2024 Shelton Arlin KIDD, MD     05/07/2024  1:02 PM Patient Name: Dustin Ferrell MRN: [id number] Epilepsy Attending: Arlin KIDD Shelton Referring Physician/Provider: Leesa Kast, DO Date: 05/07/2024 Duration: 27.19 mins Patient history: 78yo M with ho TBI with bifrontal contusions, subdural hematoma, and subarachnoid hemorrhage. EEG to evaluate for seizure Level of alertness: Awake AEDs during EEG study: VPA, Ativan  Technical aspects: This EEG study was done with scalp electrodes positioned according to the 10-20 International system of electrode placement. Electrical activity was reviewed with band pass filter of 1-70Hz , sensitivity of 7 uV/mm, display speed of 24mm/sec with a  60Hz  notched filter applied as appropriate. EEG data were recorded continuously and digitally stored.  Video monitoring was available and reviewed as appropriate. Description: EEG showed continuous generalized 5 to 6 Hz theta slowing admixed with 13-15hz  beta activity. Hyperventilation and photic stimulation were not performed.   ABNORMALITY - Continuous slow, generalized IMPRESSION: This study is suggestive of generalized cerebral dysfunction (encephalopathy). No seizures or epileptiform discharges were seen throughout the recording. Arlin KIDD Shelton   DG Chest Port 1 View Result Date: 05/06/2024  EXAM: 1 VIEW(S) XRAY OF THE CHEST 05/06/2024 07:50:00 AM COMPARISON: 05/03/2024 CLINICAL HISTORY: Aspiration into airway FINDINGS: LUNGS AND PLEURA: Low lung volume. No focal pulmonary opacity. No pleural effusion. No pneumothorax. HEART AND MEDIASTINUM: No acute abnormality of the cardiac and mediastinal silhouettes. BONES AND SOFT TISSUES: Old healed left posterolateral rib fractures. No acute osseous abnormality. IMPRESSION: 1. No acute findings. 2. Low lung volume. Electronically signed by: Timothy Berrigan MD 05/06/2024 08:52 AM EST RP Workstation: [id number]    Scheduled Meds:  amLODipine   10 mg Oral Daily   busPIRone   5 mg Oral BID   divalproex   500 mg Oral TID   insulin  aspart  0-9 Units Subcutaneous Q6H   insulin  glargine  5 Units Subcutaneous Daily   lactulose   20 g Oral TID   lisinopril   40 mg Oral Daily   pantoprazole   40 mg Oral QHS   QUEtiapine   25 mg Oral Daily   sertraline   200 mg Oral Daily   sodium chloride  flush  10-40 mL Intracatheter Q12H   sodium chloride  flush  3 mL Intravenous Q12H   Continuous Infusions:  lactated ringers  100 mL/hr at 05/06/24 1708     LOS: 4 days  MDM: Patient is high risk for one or more organ failure.  They necessitate ongoing hospitalization for continued IV therapies and subsequent lab monitoring. Total time spent interpreting labs and vitals,  reviewing the medical record, coordinating care amongst consultants and care team members, directly assessing and discussing care with the patient and/or family: 55 min Dustin Nimmons, DO [facility]  To contact the attending physician between 7A-7P please use Epic Chat. To contact the covering physician during after hours 7P-7A, please review Amion.  05/07/2024, 2:42 PM   *This document has been created with the assistance of dictation software. Please excuse typographical errors. *

## 2024-05-07 NOTE — Procedures (Signed)
 Patient Name: Dustin Ferrell  MRN: [id number]  Epilepsy Attending: Arlin MALVA Krebs  Referring Physician/Provider: Dezii, Alexandra, DO  Date: 05/07/2024 Duration: 27.19 mins  Patient history: 78yo M with ho TBI with bifrontal contusions, subdural hematoma, and subarachnoid hemorrhage. EEG to evaluate for seizure  Level of alertness: Awake  AEDs during EEG study: VPA, Ativan   Technical aspects: This EEG study was done with scalp electrodes positioned according to the 10-20 International system of electrode placement. Electrical activity was reviewed with band pass filter of 1-70Hz , sensitivity of 7 uV/mm, display speed of 85mm/sec with a 60Hz  notched filter applied as appropriate. EEG data were recorded continuously and digitally stored.  Video monitoring was available and reviewed as appropriate.  Description: EEG showed continuous generalized 5 to 6 Hz theta slowing admixed with 13-15hz  beta activity. Hyperventilation and photic stimulation were not performed.     ABNORMALITY - Continuous slow, generalized  IMPRESSION: This study is suggestive of generalized cerebral dysfunction (encephalopathy). No seizures or epileptiform discharges were seen throughout the recording.  Adabelle Griffiths O Saanvika Vazques

## 2024-05-07 NOTE — Plan of Care (Signed)
  Problem: Intracerebral Hemorrhage Tissue Perfusion: Goal: Complications of Intracerebral Hemorrhage will be minimized Outcome: Progressing   Problem: Health Behavior/Discharge Planning: Goal: Ability to manage health-related needs will improve Outcome: Progressing   Problem: Nutrition: Goal: Risk of aspiration will decrease Outcome: Progressing   Problem: Nutritional: Goal: Maintenance of adequate nutrition will improve Outcome: Progressing   Problem: Coping: Goal: Ability to adjust to condition or change in health will improve Outcome: Not Progressing

## 2024-05-07 NOTE — Progress Notes (Signed)
 Eeg done

## 2024-05-07 NOTE — Progress Notes (Signed)
 Occupational Therapy Treatment Patient Details Name: Dustin Ferrell MRN: [id number] DOB: 10-Dec-1945 Today's Date: 05/07/2024   History of present illness Pt is a 78 y.o. male with a minor head injury on top of prior significant head injury. He has history of encephalomalacia affecting the right frontal lobe that was present on prior imaging. Today he shows radiographic evidence of bifrontal contusions.  He additionally has small area of subdural hematoma along the skull base. He previously had a skull fracture which now shows evidence of extension of his fracture posteriorly on the right side through the frontal and parietal bones. CT does reveal some increased subdural collection over the left frontal parietal convexity and more prominent subarachnoid hemorrhage over the posterior parietal and occipital lobes bilaterally, EEG showing no seizure activity.   OT comments  Pt is supine in bed on arrival. Alert, but unable to follow commands consistently. Noted to be moving around in bed a lot, needed to take meds with nurse present. Pt able to roll to R side with supervision, required constant verb and tactile cues to reach EOB with Max A x1-2 using chux pads. Pt initially resistive with posterior lean requiring Max A progressing to Min/CGA with HHA from therapist. Max A for self feeding to take meds while seated at EOB with constant cueing to swallow pills. Attempted taking a sip of water with coughing noted. Max/total +2 to return to supine and reposition to [facility] with HOB elevated significantly. Sitter present in room at end of session. Left with all needs in place and will cont to require skilled acute OT services to maximize his safety and IND to return to PLOF. Most limited by cognition and inability to follow commands limited safety and requiring +2 assist for all safe mobility OOB.       If plan is discharge home, recommend the following:  Assistance with feeding;Assistance with  cooking/housework;Direct supervision/assist for medications management;Assist for transportation;Supervision due to cognitive status;Direct supervision/assist for financial management;Two people to help with walking and/or transfers;A lot of help with bathing/dressing/bathroom   Equipment Recommendations  Other (comment) (defer)    Recommendations for Other Services      Precautions / Restrictions Precautions Precautions: Fall Recall of Precautions/Restrictions: Impaired Restrictions Weight Bearing Restrictions Per Provider Order: No       Mobility Bed Mobility Overal bed mobility: Needs Assistance Bed Mobility: Rolling, Supine to Sit, Sit to Supine Rolling: Supervision, Used rails   Supine to sit: Max assist, Used rails, HOB elevated, +2 for physical assistance Sit to supine: Max assist, HOB elevated, Used rails, +2 for physical assistance, +2 for safety/equipment, Total assist   General bed mobility comments: nurse present as second assist during session, pt unable to follow commands, rolled in bed with supervision, strength intact, resistive initially, then able to progress from Max A for seated balance to Min/CGA with hand positioned in therapists pocket or holding therapists hands to take meds while seated EOB    Transfers                   General transfer comment: deferred based on session with PT and inability to follow commands     Balance Overall balance assessment: Needs assistance Sitting-balance support: Feet supported, Bilateral upper extremity supported Sitting balance-Leahy Scale: Fair Sitting balance - Comments: initially Max A progressing with Min/CGA with HHA from therapist and cueing; resistive initially Postural control: Posterior lean     Standing balance comment: deferred  ADL either performed or assessed with clinical judgement   ADL Overall ADL's : Needs assistance/impaired Eating/Feeding: Maximal  assistance;Sitting Eating/Feeding Details (indicate cue type and reason): seated EOB to eat applesauce while taking meds with nurse present                                   General ADL Comments: anticipate Max A for ADLs d/t cognition and inability to follow commands    Extremity/Trunk Assessment              Vision       Perception     Praxis     Communication Communication Communication: Impaired Factors Affecting Communication: Difficulty expressing self;Reduced clarity of speech   Cognition Arousal: Alert Behavior During Therapy: Anxious, Restless Cognition: History of cognitive impairments                               Following commands: Impaired Following commands impaired: Follows one step commands inconsistently      Cueing   Cueing Techniques: Verbal cues, Tactile cues  Exercises      Shoulder Instructions       General Comments HR up to 112 while seated EOB    Pertinent Vitals/ Pain          Home Living                                          Prior Functioning/Environment              Frequency           Progress Toward Goals  OT Goals(current goals can now be found in the care plan section)  Progress towards OT goals: Progressing toward goals  Acute Rehab OT Goals Patient Stated Goal: none stated OT Goal Formulation: Patient unable to participate in goal setting Time For Goal Achievement: 05/18/24 Potential to Achieve Goals: Good  Plan      Co-evaluation        PT goals addressed during session: Mobility/safety with mobility;Balance;Proper use of DME;Strengthening/ROM        AM-PAC OT 6 Clicks Daily Activity     Outcome Measure   Help from another person eating meals?: A Little Help from another person taking care of personal grooming?: A Lot Help from another person toileting, which includes using toliet, bedpan, or urinal?: A Lot Help from another person bathing  (including washing, rinsing, drying)?: A Lot Help from another person to put on and taking off regular upper body clothing?: A Lot Help from another person to put on and taking off regular lower body clothing?: A Lot 6 Click Score: 13    End of Session    OT Visit Diagnosis: Unsteadiness on feet (R26.81);Other abnormalities of gait and mobility (R26.89);Repeated falls (R29.6);History of falling (Z91.81);Muscle weakness (generalized) (M62.81)   Activity Tolerance Patient tolerated treatment well;Other (comment) (limited by cognition)   Patient Left in bed;with nursing/sitter in room;with call bell/phone within reach   Nurse Communication Mobility status        Time: 8383-8373 OT Time Calculation (min): 10 min  Charges: OT General Charges $OT Visit: 1 Visit OT Treatments $Therapeutic Activity: 8-22 mins  Samhitha Rosen Chrismon, OTR/L  05/07/24, 5:34 PM   Joanell Cressler E Chrismon 05/07/2024,  5:28 PM

## 2024-05-07 NOTE — Inpatient Diabetes Management (Signed)
 Inpatient Diabetes Program Recommendations  AACE/ADA: New Consensus Statement on Inpatient Glycemic Control   Target Ranges:  Prepandial:   less than 140 mg/dL      [facility] postprandial:   less than 180 mg/dL (1-2 hours)      Critically ill patients:  140 - 180 mg/dL    Latest Reference Range & Units 05/06/24 06:06 05/06/24 07:43 05/06/24 13:29 05/06/24 19:51 05/07/24 01:13 05/07/24 07:39  Glucose-Capillary 70 - 99 mg/dL 749 (H) 775 (H) 786 (H) 302 (H) 286 (H) 188 (H)   Review of Glycemic Control  Diabetes history: DM2 Outpatient Diabetes medications: Tresiba  23 units daily, Januvia  100 mg daily Current orders for Inpatient glycemic control: Lantus  5 units daily, Novolog  0-9 units Q6H  Inpatient Diabetes Program Recommendations:    Insulin : Please consider increasing Lantus  to 10 units daily.  Thanks, Earnie Gainer, RN, MSN, CDCES Diabetes Coordinator Inpatient Diabetes Program [phone] (Team Pager from 8am to 5pm)

## 2024-05-07 NOTE — Progress Notes (Addendum)

## 2024-05-07 NOTE — Plan of Care (Signed)
  Problem: Education: Goal: Knowledge of disease or condition will improve Outcome: Progressing Goal: Knowledge of secondary prevention will improve (MUST DOCUMENT ALL) Outcome: Progressing Goal: Knowledge of patient specific risk factors will improve (DELETE if not current risk factor) Outcome: Progressing   Problem: Intracerebral Hemorrhage Tissue Perfusion: Goal: Complications of Intracerebral Hemorrhage will be minimized Outcome: Progressing   Problem: Coping: Goal: Will verbalize positive feelings about self Outcome: Progressing Goal: Will identify appropriate support needs Outcome: Progressing   Problem: Health Behavior/Discharge Planning: Goal: Ability to manage health-related needs will improve Outcome: Progressing Goal: Goals will be collaboratively established with patient/family Outcome: Progressing   Problem: Self-Care: Goal: Ability to participate in self-care as condition permits will improve Outcome: Progressing Goal: Verbalization of feelings and concerns over difficulty with self-care will improve Outcome: Progressing Goal: Ability to communicate needs accurately will improve Outcome: Progressing   Problem: Nutrition: Goal: Risk of aspiration will decrease Outcome: Progressing Goal: Dietary intake will improve Outcome: Progressing   Problem: Education: Goal: Ability to describe self-care measures that may prevent or decrease complications (Diabetes Survival Skills Education) will improve Outcome: Progressing Goal: Individualized Educational Video(s) Outcome: Progressing   Problem: Coping: Goal: Ability to adjust to condition or change in health will improve Outcome: Progressing   Problem: Fluid Volume: Goal: Ability to maintain a balanced intake and output will improve Outcome: Progressing   Problem: Health Behavior/Discharge Planning: Goal: Ability to identify and utilize available resources and services will improve Outcome: Progressing Goal:  Ability to manage health-related needs will improve Outcome: Progressing   Problem: Metabolic: Goal: Ability to maintain appropriate glucose levels will improve Outcome: Progressing   Problem: Nutritional: Goal: Maintenance of adequate nutrition will improve Outcome: Progressing Goal: Progress toward achieving an optimal weight will improve Outcome: Progressing   Problem: Skin Integrity: Goal: Risk for impaired skin integrity will decrease Outcome: Progressing   Problem: Tissue Perfusion: Goal: Adequacy of tissue perfusion will improve Outcome: Progressing   Problem: Education: Goal: Knowledge of General Education information will improve Description: Including pain rating scale, medication(s)/side effects and non-pharmacologic comfort measures Outcome: Progressing   Problem: Health Behavior/Discharge Planning: Goal: Ability to manage health-related needs will improve Outcome: Progressing   Problem: Clinical Measurements: Goal: Ability to maintain clinical measurements within normal limits will improve Outcome: Progressing Goal: Will remain free from infection Outcome: Progressing Goal: Diagnostic test results will improve Outcome: Progressing Goal: Respiratory complications will improve Outcome: Progressing Goal: Cardiovascular complication will be avoided Outcome: Progressing   Problem: Activity: Goal: Risk for activity intolerance will decrease Outcome: Progressing   Problem: Nutrition: Goal: Adequate nutrition will be maintained Outcome: Progressing   Problem: Coping: Goal: Level of anxiety will decrease Outcome: Progressing   Problem: Elimination: Goal: Will not experience complications related to bowel motility Outcome: Progressing Goal: Will not experience complications related to urinary retention Outcome: Progressing   Problem: Pain Managment: Goal: General experience of comfort will improve and/or be controlled Outcome: Progressing   Problem:  Safety: Goal: Ability to remain free from injury will improve Outcome: Progressing   Problem: Skin Integrity: Goal: Risk for impaired skin integrity will decrease Outcome: Progressing

## 2024-05-07 NOTE — TOC Progression Note (Signed)
 Transition of Care [facility] - Progression Note    Patient Details  Name: Dustin Ferrell MRN: [id number] Date of Birth: [birth year]  [facility] CM/SW Contact  Dalia GORMAN Fuse, RN Phone Number: 05/07/2024, 2:36 PM  Clinical Narrative:    TOC spoke with the patient's wife and daughter in the room and advised there are no bed offers. They advised they spoke with the insurance who provided a list of INN facilities.  Bed search extended at there request. TOC sent  the FL2 to [facility], [facility], [facility]. [facility] is also on their list, but was sent previously and is pending. [facility] also showed as INN, but previously declined, with a reason of ineligible. TOC explained that each facility has the right to decline or admit who they want based on availability of beds, staffing, and ability to meet patient needs.   TOC received a message from Nate, PTA advising the family wanted info on private pay for SNF at [facility]. TOC spoke with Alfonso at [facility] and private pay for SNF is not an option. You need to be a part of their community or have insurance approval.  TOC will continue to follow.                     Expected Discharge Plan and Services                                               Social Drivers of Health (SDOH) Interventions SDOH Screenings   Food Insecurity: No Food Insecurity (05/04/2024)  Housing: Low Risk  (05/04/2024)  Transportation Needs: No Transportation Needs (05/04/2024)  Utilities: Not At Risk (05/04/2024)  Depression (PHQ2-9): Low Risk  (07/11/2019)  Financial Resource Strain: Low Risk  (05/03/2024)   Received from [facility]  Social Connections: Unknown (05/04/2024)  Tobacco Use: High Risk (05/03/2024)    Readmission Risk Interventions     No data to display

## 2024-05-08 ENCOUNTER — Inpatient Hospital Stay

## 2024-05-08 ENCOUNTER — Inpatient Hospital Stay
Admit: 2024-05-08 | Discharge: 2024-05-08 | Disposition: A | Discharge status: Home / Self Care | Attending: Family Medicine | Admitting: Family Medicine

## 2024-05-08 DIAGNOSIS — F0390 Unspecified dementia without behavioral disturbance: Secondary | ICD-10-CM | POA: Diagnosis not present

## 2024-05-08 DIAGNOSIS — Z87820 Personal history of traumatic brain injury: Secondary | ICD-10-CM | POA: Diagnosis not present

## 2024-05-08 DIAGNOSIS — R569 Unspecified convulsions: Secondary | ICD-10-CM | POA: Diagnosis not present

## 2024-05-08 DIAGNOSIS — E1159 Type 2 diabetes mellitus with other circulatory complications: Secondary | ICD-10-CM | POA: Diagnosis not present

## 2024-05-08 LAB — GLUCOSE, CAPILLARY
Glucose-Capillary: 181 mg/dL — ABNORMAL HIGH (ref 70–99)
Glucose-Capillary: 187 mg/dL — ABNORMAL HIGH (ref 70–99)
Glucose-Capillary: 199 mg/dL — ABNORMAL HIGH (ref 70–99)
Glucose-Capillary: 244 mg/dL — ABNORMAL HIGH (ref 70–99)
Glucose-Capillary: 277 mg/dL — ABNORMAL HIGH (ref 70–99)
Glucose-Capillary: 289 mg/dL — ABNORMAL HIGH (ref 70–99)

## 2024-05-08 MED ORDER — QUETIAPINE FUMARATE 25 MG PO TABS
37.5000 mg | ORAL_TABLET | ORAL | Status: DC
  Administered 2024-05-08: 37.5 mg via ORAL
  Filled 2024-05-08: qty 2

## 2024-05-08 MED ORDER — LORAZEPAM 2 MG/ML IJ SOLN
1.0000 mg | INTRAMUSCULAR | Status: DC | PRN
  Administered 2024-05-08: 1 mg via INTRAVENOUS
  Filled 2024-05-08: qty 1

## 2024-05-08 MED ORDER — METOPROLOL TARTRATE 5 MG/5ML IV SOLN
5.0000 mg | Freq: Once | INTRAVENOUS | Status: AC
  Administered 2024-05-08: 5 mg via INTRAVENOUS
  Filled 2024-05-08: qty 5

## 2024-05-08 MED ORDER — LORAZEPAM 2 MG/ML IJ SOLN
2.0000 mg | Freq: Once | INTRAMUSCULAR | Status: AC
  Administered 2024-05-08: 2 mg via INTRAVENOUS
  Filled 2024-05-08: qty 1

## 2024-05-08 MED ORDER — LISINOPRIL 5 MG PO TABS
30.0000 mg | ORAL_TABLET | Freq: Every day | ORAL | Status: DC
  Administered 2024-05-08 – 2024-05-10 (×2): 30 mg via ORAL
  Filled 2024-05-08 (×3): qty 1

## 2024-05-08 NOTE — TOC Progression Note (Addendum)
 Transition of Care [facility]) - Progression Note    Patient Details  Name: LEO FRAY MRN: [id number] Date of Birth: [birth year]  [facility]) CM/SW Contact  Dalia GORMAN Fuse, RN Phone Number: 05/08/2024, 9:46 AM  Clinical Narrative:     [facility] in [place] is considering. TOC spoke with Glenys with [facility] and advised that the Bj's is limited. The family is interested in [facility] bc it is INN and due to the Medicare star ratings. Glenys will review clinical when she arrives at the office and f/u with TOC.                    Expected Discharge Plan and Services                                               Social Drivers of Health (SDOH) Interventions SDOH Screenings   Food Insecurity: No Food Insecurity (05/04/2024)  Housing: Low Risk  (05/04/2024)  Transportation Needs: No Transportation Needs (05/04/2024)  Utilities: Not At Risk (05/04/2024)  Depression (PHQ2-9): Low Risk  (07/11/2019)  Financial Resource Strain: Low Risk  (05/03/2024)   Received from [facility]  Social Connections: Unknown (05/04/2024)  Tobacco Use: High Risk (05/03/2024)    Readmission Risk Interventions     No data to display

## 2024-05-08 NOTE — Progress Notes (Signed)
 PROGRESS NOTE    [name]  FMW:[id number] DOB: October 30, 1945 DOA: 05/03/2024 PCP: [name], MD  Chief Complaint  Patient presents with   [name] Course:  Dustin Ferrell is an 78 year old with hypertension, hyperlipidemia, type 2 diabetes, TBI with residual cognitive impairment, dementia, who presented to the hospital because his wife noticed bruises on his face and increasing confusion. At baseline the patient lives with his wife.  He is largely independent of ADLs.  His wife does report that over the last 5 years he has become increasingly reclusive and unsteady on his feet.  He occasionally uses a walker when out of the home, but typically ambulates without 1 at home. On arrival in the ED he was found to have intracranial hemorrhage with subdural hematoma, subarachnoid hemorrhage.  Neurosurgery was consulted who recommended no surgical intervention.  Repeat head CT was stable. Patient had worsening mentation in the on 12/7 to repeat head CT was performed.  Repeat head CT shows mildly increased bleeding.  Subcu heparin  discontinued. By evaluation on 12/9 patient was much more alert, he is conversant and following commands.  Working with physical therapy well.  EEG performed which is negative for epileptiform activity but does reveal some diffuse slowing consistent with encephalopathy.  Subjective: Patient is a little more disoriented today.  His daughter reports he did not sleep well overnight.  Later in the afternoon around 1600 I was called to bedside for rapid heart rate.  Telemetry shows patient is in atrial fibrillation with RVR.  This is a new diagnosis for the patient.  He is also very agitated and trying to get out of bed.  We administered 2 mg of Ativan  with significant improvement.  We then administered 5 mg IVP metoprolol  and rate resolved into the low 100s. Patient's wife and daughter were at bedside.  I updated both of them.  Objective: Vitals:    05/07/24 1213 05/08/24 0406 05/08/24 0708 05/08/24 1245  BP: 128/83 109/70 (!) 98/55 (!) 137/109  Pulse: 65 77  (!) 105  Resp:  17 16 16   Temp: 99.3 F (37.4 C)  (!) 97.5 F (36.4 C) (!) 97.4 F (36.3 C)  TempSrc: Oral  Oral   SpO2:  90% 92% 96%  Weight:  (!) 138.4 kg    Height:        Intake/Output Summary (Last 24 hours) at 05/08/2024 1510 Last data filed at 05/08/2024 0616 Gross per 24 hour  Intake 1375.19 ml  Output --  Net 1375.19 ml   Filed Weights   05/06/24 2005 05/07/24 0453 05/08/24 0406  Weight: 68.9 kg 68.9 kg (!) 138.4 kg    Examination: General exam: No acute distress Respiratory system: No work of breathing, symmetric chest wall expansion Cardiovascular system: S1 & S2 heard, RRR.  Gastrointestinal system: Abdomen is nondistended, soft and nontender.  Neuro: Drowsy, eyes closed, inconsistently following commands.  Not talkative.  Assessment & Plan:  Principal Problem:   Intracranial hemorrhage (HCC) Active Problems:   Diabetes mellitus due to underlying condition without complication, with long-term current use of insulin  (HCC)   Dementia without behavioral disturbance (HCC)   Hypertension associated with diabetes (HCC)   Hx of traumatic brain injury   Seizures (HCC)   Intraparenchymal hemorrhage of brain (HCC)   Subdural hematoma (HCC)    Intracranial hemorrhage - Bifrontal contusions, subdural hematoma along skull base.  Likely traumatic - Initial head CT stable - Increasing somnolence on 12/7,  repeat CT does reveal some increased subdural collection over the left frontal parietal convexity and more prominent subarachnoid hemorrhage over the posterior parietal and occipital lobes bilaterally, otherwise scan is stable. - Was on heparin  subcu, have discontinued this. - EEG 12/9 no seizures or epileptiform discharges, generalized cerebral dysfunction consistent with encephalopathy - Mild elevation in ammonia, doubt this is contributing  significantly but has started on lactulose  for constipation.   - Family denies that patient has had recent access to alcohol, previously was a heavier drinker in the past - Receiving Seroquel  at 2000, will increase dose slightly to allow for better nighttime sleep. - Also discussed with family the importance of reorientation and enforcing sleep-wake cycles. - Continue with low-dose Ativan  as needed - Avoid additional Haldol  - Continue close monitoring, sitter at bedside and family not present.  - Presently no plan for surgical intervention.  Dr. Katrina of neurosurgery still following. - Brain MRI has been ordered by neurosurgical team, presently patient is too agitated to receive this scan - Continue working with PT/OT is much as able.  We will send new clinicals to skilled nursing facilities as patient improves - Continue with aspiration precautions, one-to-one feeding.    A-fib with RVR - This is a new diagnosis for this patient.  Likely provoked by stress of this hospitalization and agitation just prior causing elevated heart rate - Have administered 5 mg IVP metoprolol  for now with significant improvement - Will order echocardiogram - Does follow outpatient with cardiology Dr. Wilburn - Patient has been coming increasingly tachycardic over the last 48 hours.  AST to ALT ratio 2:1.  Patient's wife denies that patient has had any recent access to alcohol, though his daughter admits he may have found a way.  He does have a history of heavy drinking.  We will maintain a low suspicion for alcohol withdrawal.  CIWA protocol initiated.  Leukocytosis and fever - New leukocytosis 12/7, fever 100.4 on 12/8.  No further fever.  Leukocytosis resolving now.  May have been reactive - No obvious signs or symptoms of infection. - UA was ordered but has not been obtained, have asked RN - CXR negative for acute findings, may be early aspiration pneumonitis - RVP negative - Continue aspiration  precautions - Continue monitoring CBC and fever curve  Urinary retention - Patient having difficulty spontaneously voiding - Flomax  added - Has required multiple straight caths now.  Will place Foley catheter for better monitoring. - Continue with treating constipation which may assist  Hypertensive urgency Uncontrolled hypertension - Mildly hypotensive this morning.  Backing off on lisinopril .  Continue current dose amlodipine  - Continue to follow closely and titrate as needed - Continue with as needed labetalol  and hydralazine . - SBP goal less than 140  Type 2 diabetes, uncontrolled - CBGs remain above goal - Continue with sliding scale, titrate as needed.  If p.o. intake becomes more consistent we will increase basal dosing - HgbA1c 8.4%  Dementia with behavioral disturbance -At baseline patient lives with his wife, he is largely ambulatory around the home.  His wife endorses progressive decline over the last 5 years where he has become increasingly reclusive and unsteady.  He otherwise is conversational at baseline, and performs most ADLs - Daily Seroquel  at 2000 increase dose today, discontinue Haldol  - As needed Ativan  - Consider additional low-dose Seroquel  in a.m. if needed  History of TBI Anxiety Seizure disorder - Continue home meds  Body mass index is 50.77 kg/m. - Outpatient follow up for  lifestyle modification and risk factor management   DVT prophylaxis: SCDs only for now   Code Status: Limited: Do not attempt resuscitation (DNR) -DNR-LIMITED -Do Not Intubate/DNI  Disposition: Inpatient pending clinical resolution, eventually DC to SNF  Consultants:  Treatment Team:  Consulting Physician: Clois Fret, MD  Procedures:    Antimicrobials:  Anti-infectives (From admission, onward)    None       Data Reviewed: I have personally reviewed following labs and imaging studies CBC: Recent Labs  Lab 05/03/24 1257 05/04/24 0527 05/05/24 1119  05/06/24 0626 05/07/24 0807  WBC 8.8 6.7 13.5* 13.5* 12.6*  NEUTROABS 6.6  --  8.8*  --  9.7*  HGB 12.2* 11.1* 14.8 15.5 14.0  HCT 35.9* 33.4* 42.6 44.1 42.9  MCV 90.2 92.0 88.0 87.8 93.1  PLT 177 155 281 289 297   Basic Metabolic Panel: Recent Labs  Lab 05/03/24 1257 05/03/24 2158 05/04/24 0527 05/04/24 0935 05/05/24 0851 05/06/24 0626 05/07/24 0807  NA 135 134* 135 137 136 141 146*  K 4.3  --  3.9  --  4.3 4.5 3.9  CL 97*  --  101  --  99 104 106  CO2 24  --  23  --  20* 22 23  GLUCOSE 181*  --  108*  --  204* 243* 192*  BUN 30*  --  26*  --  33* 41* 65*  CREATININE 0.87  --  0.70  --  1.06 1.13 1.57*  CALCIUM  9.6  --  8.9  --  9.7 9.8 9.8  MG  --  1.9  --   --   --   --   --    GFR: Estimated Creatinine Clearance: 49 mL/min (A) (by C-G formula based on SCr of 1.57 mg/dL (H)). Liver Function Tests: Recent Labs  Lab 05/03/24 1257 05/05/24 0851 05/06/24 0626 05/07/24 0807  AST 23 51* 38 44*  ALT 23 29 26  32  ALKPHOS 61 81 76 76  BILITOT 0.6 0.6 0.7 0.6  PROT 7.2 7.2 7.3 7.1  ALBUMIN 4.3 3.9 3.9 3.7   CBG: Recent Labs  Lab 05/07/24 2013 05/08/24 0008 05/08/24 0624 05/08/24 0712 05/08/24 1248  GLUCAP 372* 181* 199* 187* 289*    Recent Results (from the past 240 hours)  Resp panel by RT-PCR (RSV, Flu A&B, Covid) Anterior Nasal Swab     Status: None   Collection Time: 05/06/24  3:26 PM   Specimen: Anterior Nasal Swab  Result Value Ref Range Status   SARS Coronavirus 2 by RT PCR NEGATIVE NEGATIVE Final    Comment: (NOTE) SARS-CoV-2 target nucleic acids are NOT DETECTED.  The SARS-CoV-2 RNA is generally detectable in upper respiratory specimens during the acute phase of infection. The lowest concentration of SARS-CoV-2 viral copies this assay can detect is 138 copies/mL. A negative result does not preclude SARS-Cov-2 infection and should not be used as the sole basis for treatment or other patient management decisions. A negative result may occur with   improper specimen collection/handling, submission of specimen other than nasopharyngeal swab, presence of viral mutation(s) within the areas targeted by this assay, and inadequate number of viral copies(<138 copies/mL). A negative result must be combined with clinical observations, patient history, and epidemiological information. The expected result is Negative.  Fact Sheet for Patients:  bloggercourse.com  Fact Sheet for Healthcare Providers:  seriousbroker.it  This test is no t yet approved or cleared by the [place]  FDA and  has been authorized for detection and/or  diagnosis of SARS-CoV-2 by FDA under an Emergency Use Authorization (EUA). This EUA will remain  in effect (meaning this test can be used) for the duration of the COVID-19 declaration under Section 564(b)(1) of the Act, 21 U.S.C.section 360bbb-3(b)(1), unless the authorization is terminated  or revoked sooner.       Influenza A by PCR NEGATIVE NEGATIVE Final   Influenza B by PCR NEGATIVE NEGATIVE Final    Comment: (NOTE) The Xpert Xpress SARS-CoV-2/FLU/RSV plus assay is intended as an aid in the diagnosis of influenza from Nasopharyngeal swab specimens and should not be used as a sole basis for treatment. Nasal washings and aspirates are unacceptable for Xpert Xpress SARS-CoV-2/FLU/RSV testing.  Fact Sheet for Patients: bloggercourse.com  Fact Sheet for Healthcare Providers: seriousbroker.it  This test is not yet approved or cleared by the [place]  FDA and has been authorized for detection and/or diagnosis of SARS-CoV-2 by FDA under an Emergency Use Authorization (EUA). This EUA will remain in effect (meaning this test can be used) for the duration of the COVID-19 declaration under Section 564(b)(1) of the Act, 21 U.S.C. section 360bbb-3(b)(1), unless the authorization is terminated or revoked.      Resp Syncytial Virus by PCR NEGATIVE NEGATIVE Final    Comment: (NOTE) Fact Sheet for Patients: bloggercourse.com  Fact Sheet for Healthcare Providers: seriousbroker.it  This test is not yet approved or cleared by the [place]  FDA and has been authorized for detection and/or diagnosis of SARS-CoV-2 by FDA under an Emergency Use Authorization (EUA). This EUA will remain in effect (meaning this test can be used) for the duration of the COVID-19 declaration under Section 564(b)(1) of the Act, 21 U.S.C. section 360bbb-3(b)(1), unless the authorization is terminated or revoked.  Performed at [facility], [address] [place], [place] [address]   Respiratory (~20 pathogens) panel by PCR     Status: None   Collection Time: 05/06/24  3:26 PM   Specimen: Nasopharyngeal Swab; Respiratory  Result Value Ref Range Status   Adenovirus NOT DETECTED NOT DETECTED Final   Coronavirus 229E NOT DETECTED NOT DETECTED Final    Comment: (NOTE) The Coronavirus on the Respiratory Panel, DOES NOT test for the novel  Coronavirus (2019 nCoV)    Coronavirus HKU1 NOT DETECTED NOT DETECTED Final   Coronavirus NL63 NOT DETECTED NOT DETECTED Final   Coronavirus OC43 NOT DETECTED NOT DETECTED Final   Metapneumovirus NOT DETECTED NOT DETECTED Final   Rhinovirus / Enterovirus NOT DETECTED NOT DETECTED Final   Influenza A NOT DETECTED NOT DETECTED Final   Influenza B NOT DETECTED NOT DETECTED Final   Parainfluenza Virus 1 NOT DETECTED NOT DETECTED Final   Parainfluenza Virus 2 NOT DETECTED NOT DETECTED Final   Parainfluenza Virus 3 NOT DETECTED NOT DETECTED Final   Parainfluenza Virus 4 NOT DETECTED NOT DETECTED Final   Respiratory Syncytial Virus NOT DETECTED NOT DETECTED Final   Bordetella pertussis NOT DETECTED NOT DETECTED Final   Bordetella Parapertussis NOT DETECTED NOT DETECTED Final   Chlamydophila pneumoniae NOT DETECTED NOT  DETECTED Final   Mycoplasma pneumoniae NOT DETECTED NOT DETECTED Final    Comment: Performed at [facility], [address] [place], [place] [address]     Radiology Studies: EEG adult Result Date: 05/07/2024 Shelton Arlin KIDD, MD     05/07/2024  1:02 PM Patient Name: Dustin Ferrell MRN: [id number] Epilepsy Attending: Arlin KIDD Shelton Referring Physician/Provider: Gail Creekmore, DO Date: 05/07/2024 Duration: 27.19 mins Patient history: 78yo M with  ho TBI with bifrontal contusions, subdural hematoma, and subarachnoid hemorrhage. EEG to evaluate for seizure Level of alertness: Awake AEDs during EEG study: VPA, Ativan  Technical aspects: This EEG study was done with scalp electrodes positioned according to the 10-20 International system of electrode placement. Electrical activity was reviewed with band pass filter of 1-70Hz , sensitivity of 7 uV/mm, display speed of 62mm/sec with a 60Hz  notched filter applied as appropriate. EEG data were recorded continuously and digitally stored.  Video monitoring was available and reviewed as appropriate. Description: EEG showed continuous generalized 5 to 6 Hz theta slowing admixed with 13-15hz  beta activity. Hyperventilation and photic stimulation were not performed.   ABNORMALITY - Continuous slow, generalized IMPRESSION: This study is suggestive of generalized cerebral dysfunction (encephalopathy). No seizures or epileptiform discharges were seen throughout the recording. Priyanka O Yadav    Scheduled Meds:  amLODipine   10 mg Oral Daily   busPIRone   5 mg Oral BID   divalproex   500 mg Oral TID   insulin  aspart  0-9 Units Subcutaneous Q6H   insulin  glargine  5 Units Subcutaneous Daily   lactulose   20 g Oral TID   lisinopril   30 mg Oral Daily   LORazepam   2 mg Intravenous Once   pantoprazole   40 mg Oral QHS   QUEtiapine   25 mg Oral Daily   sertraline   200 mg Oral Daily   sodium chloride  flush  10-40 mL Intracatheter Q12H   sodium chloride  flush  3 mL  Intravenous Q12H   tamsulosin   0.8 mg Oral QPC breakfast   Continuous Infusions:  lactated ringers  100 mL/hr at 05/08/24 0616     LOS: 5 days  MDM: Patient is high risk for one or more organ failure.  They necessitate ongoing hospitalization for continued IV therapies and subsequent lab monitoring. Total time spent interpreting labs and vitals, reviewing the medical record, coordinating care amongst consultants and care team members, directly assessing and discussing care with the patient and/or family: 55 min Antonio Woodhams, DO [facility]  To contact the attending physician between 7A-7P please use Epic Chat. To contact the covering physician during after hours 7P-7A, please review Amion.  05/08/2024, 3:10 PM   *This document has been created with the assistance of dictation software. Please excuse typographical errors. *

## 2024-05-08 NOTE — Plan of Care (Signed)
°  Problem: Education: Goal: Knowledge of disease or condition will improve Outcome: Not Progressing   Problem: Health Behavior/Discharge Planning: Goal: Ability to manage health-related needs will improve Outcome: Not Progressing Goal: Goals will be collaboratively established with patient/family Outcome: Not Progressing   Problem: Self-Care: Goal: Ability to participate in self-care as condition permits will improve Outcome: Not Progressing Goal: Verbalization of feelings and concerns over difficulty with self-care will improve Outcome: Not Progressing   Problem: Nutrition: Goal: Risk of aspiration will decrease Outcome: Not Progressing   Problem: Nutritional: Goal: Maintenance of adequate nutrition will improve Outcome: Not Progressing

## 2024-05-08 NOTE — Inpatient Diabetes Management (Signed)
 Inpatient Diabetes Program Recommendations  AACE/ADA: New Consensus Statement on Inpatient Glycemic Control   Target Ranges:  Prepandial:   less than 140 mg/dL      Peak postprandial:   less than 180 mg/dL (1-2 hours)      Critically ill patients:  140 - 180 mg/dL    Latest Reference Range & Units 05/07/24 07:39 05/07/24 12:26 05/07/24 20:13 05/08/24 00:08 05/08/24 06:24 05/08/24 07:12 05/08/24 12:48  Glucose-Capillary 70 - 99 mg/dL 811 (H) 808 (H) 627 (H) 181 (H) 199 (H) 187 (H) 289 (H)    Latest Reference Range & Units 05/06/24 06:06 05/06/24 07:43 05/06/24 13:29 05/06/24 19:51 05/07/24 01:13  Glucose-Capillary 70 - 99 mg/dL 749 (H) 775 (H) 786 (H) 302 (H) 286 (H)   Review of Glycemic Control  Diabetes history: DM2 Outpatient Diabetes medications: Tresiba  23 units daily, Januvia  100 mg daily Current orders for Inpatient glycemic control: Lantus  5 units daily, Novolog  0-9 units Q6H   Inpatient Diabetes Program Recommendations:     Insulin : Please consider increasing Lantus  to 10 units daily.   Thanks, Earnie Gainer, RN, MSN, CDCES Diabetes Coordinator Inpatient Diabetes Program [phone] (Team Pager from 8am to 5pm)

## 2024-05-08 NOTE — Progress Notes (Signed)
 Neurosurgery Progress Note  History: Dustin Ferrell is here for traumatic brain injury  HD5: Pt is more disoriented today. EEG yesterday showing generalized cerebral dysfunction.   HD4: Has continued agitation.   HD3: Agitated yesterday and overnight.  HD2: No issues overnight.  He still has a headache.  Otherwise he has no complaints  Physical Exam: Vitals:   05/08/24 0708 05/08/24 1245  BP: (!) 98/55 (!) 137/109  Pulse:  (!) 105  Resp: 16 16  Temp: (!) 97.5 F (36.4 C) (!) 97.4 F (36.3 C)  SpO2: 92% 96%   Does not open eyes or regard. Visibly agitated and attempting to get out of bed  Strength: moves all extremities spontaneously   Data:  Other tests/results:  Sodium stable  Assessment/Plan:  Dustin Ferrell is here with traumatic brain injury with bifrontal contusions, subdural hematoma, and subarachnoid hemorrhage.  - mobilize - pain control - DVT prophylaxis - held for now, ok to start back on 12/9 - PTOT - Maintain sodium above 135 and continue antiepileptic medication - EEG showing generalized cerebral dysfunction.  - will discuss MRI with Dr. Clois Edsel Goods PA-C Department of Neurosurgery

## 2024-05-08 NOTE — Plan of Care (Signed)
  Problem: Education: Goal: Knowledge of disease or condition will improve Outcome: Progressing Goal: Knowledge of secondary prevention will improve (MUST DOCUMENT ALL) Outcome: Progressing Goal: Knowledge of patient specific risk factors will improve (DELETE if not current risk factor) Outcome: Progressing   Problem: Intracerebral Hemorrhage Tissue Perfusion: Goal: Complications of Intracerebral Hemorrhage will be minimized Outcome: Progressing   Problem: Coping: Goal: Will verbalize positive feelings about self Outcome: Progressing Goal: Will identify appropriate support needs Outcome: Progressing   Problem: Health Behavior/Discharge Planning: Goal: Ability to manage health-related needs will improve Outcome: Progressing Goal: Goals will be collaboratively established with patient/family Outcome: Progressing   Problem: Self-Care: Goal: Ability to participate in self-care as condition permits will improve Outcome: Progressing Goal: Verbalization of feelings and concerns over difficulty with self-care will improve Outcome: Progressing Goal: Ability to communicate needs accurately will improve Outcome: Progressing   Problem: Nutrition: Goal: Risk of aspiration will decrease Outcome: Progressing Goal: Dietary intake will improve Outcome: Progressing   Problem: Education: Goal: Ability to describe self-care measures that may prevent or decrease complications (Diabetes Survival Skills Education) will improve Outcome: Progressing Goal: Individualized Educational Video(s) Outcome: Progressing   Problem: Coping: Goal: Ability to adjust to condition or change in health will improve Outcome: Progressing   Problem: Fluid Volume: Goal: Ability to maintain a balanced intake and output will improve Outcome: Progressing   Problem: Health Behavior/Discharge Planning: Goal: Ability to identify and utilize available resources and services will improve Outcome: Progressing Goal:  Ability to manage health-related needs will improve Outcome: Progressing   Problem: Metabolic: Goal: Ability to maintain appropriate glucose levels will improve Outcome: Progressing   Problem: Nutritional: Goal: Maintenance of adequate nutrition will improve Outcome: Progressing Goal: Progress toward achieving an optimal weight will improve Outcome: Progressing   Problem: Skin Integrity: Goal: Risk for impaired skin integrity will decrease Outcome: Progressing   Problem: Tissue Perfusion: Goal: Adequacy of tissue perfusion will improve Outcome: Progressing   Problem: Education: Goal: Knowledge of General Education information will improve Description: Including pain rating scale, medication(s)/side effects and non-pharmacologic comfort measures Outcome: Progressing   Problem: Health Behavior/Discharge Planning: Goal: Ability to manage health-related needs will improve Outcome: Progressing   Problem: Clinical Measurements: Goal: Ability to maintain clinical measurements within normal limits will improve Outcome: Progressing Goal: Will remain free from infection Outcome: Progressing Goal: Diagnostic test results will improve Outcome: Progressing Goal: Respiratory complications will improve Outcome: Progressing Goal: Cardiovascular complication will be avoided Outcome: Progressing   Problem: Activity: Goal: Risk for activity intolerance will decrease Outcome: Progressing   Problem: Nutrition: Goal: Adequate nutrition will be maintained Outcome: Progressing   Problem: Coping: Goal: Level of anxiety will decrease Outcome: Progressing   Problem: Elimination: Goal: Will not experience complications related to bowel motility Outcome: Progressing Goal: Will not experience complications related to urinary retention Outcome: Progressing   Problem: Pain Managment: Goal: General experience of comfort will improve and/or be controlled Outcome: Progressing   Problem:  Safety: Goal: Ability to remain free from injury will improve Outcome: Progressing   Problem: Skin Integrity: Goal: Risk for impaired skin integrity will decrease Outcome: Progressing

## 2024-05-09 LAB — CBC
HCT: 39.5 % (ref 39.0–52.0)
Hemoglobin: 12.8 g/dL — ABNORMAL LOW (ref 13.0–17.0)
MCH: 29.8 pg (ref 26.0–34.0)
MCHC: 32.4 g/dL (ref 30.0–36.0)
MCV: 92.1 fL (ref 80.0–100.0)
Platelets: 234 K/uL (ref 150–400)
RBC: 4.29 MIL/uL (ref 4.22–5.81)
RDW: 14.6 % (ref 11.5–15.5)
WBC: 10.2 K/uL (ref 4.0–10.5)
nRBC: 0 % (ref 0.0–0.2)

## 2024-05-09 LAB — GLUCOSE, CAPILLARY
Glucose-Capillary: 199 mg/dL — ABNORMAL HIGH (ref 70–99)
Glucose-Capillary: 205 mg/dL — ABNORMAL HIGH (ref 70–99)
Glucose-Capillary: 229 mg/dL — ABNORMAL HIGH (ref 70–99)
Glucose-Capillary: 248 mg/dL — ABNORMAL HIGH (ref 70–99)

## 2024-05-09 LAB — RENAL FUNCTION PANEL
Albumin: 3 g/dL — ABNORMAL LOW (ref 3.5–5.0)
Anion gap: 14 (ref 5–15)
BUN: 62 mg/dL — ABNORMAL HIGH (ref 8–23)
CO2: 21 mmol/L — ABNORMAL LOW (ref 22–32)
Calcium: 9.5 mg/dL (ref 8.9–10.3)
Chloride: 117 mmol/L — ABNORMAL HIGH (ref 98–111)
Creatinine, Ser: 1.08 mg/dL (ref 0.61–1.24)
GFR, Estimated: >60 mL/min (ref >60)
Glucose, Bld: 247 mg/dL — ABNORMAL HIGH (ref 70–99)
Phosphorus: 3.1 mg/dL (ref 2.5–4.6)
Potassium: 3.5 mmol/L (ref 3.5–5.1)
Sodium: 152 mmol/L — ABNORMAL HIGH (ref 135–145)

## 2024-05-09 LAB — ECHOCARDIOGRAM COMPLETE
Area-P 1/2: 4.14 cm2
Height: 65 in
S' Lateral: 2.4 cm
Weight: 2430.35 [oz_av]

## 2024-05-09 LAB — MAGNESIUM: Magnesium: 3.3 mg/dL — ABNORMAL HIGH (ref 1.7–2.4)

## 2024-05-09 MED ORDER — INSULIN GLARGINE 100 UNIT/ML ~~LOC~~ SOLN
10.0000 [IU] | Freq: Every day | SUBCUTANEOUS | Status: DC
  Administered 2024-05-09 – 2024-05-10 (×2): 10 [IU] via SUBCUTANEOUS
  Filled 2024-05-09 (×2): qty 0.1

## 2024-05-09 NOTE — Plan of Care (Signed)
  Problem: Education: Goal: Knowledge of disease or condition will improve Outcome: Progressing Goal: Knowledge of secondary prevention will improve (MUST DOCUMENT ALL) Outcome: Progressing Goal: Knowledge of patient specific risk factors will improve (DELETE if not current risk factor) Outcome: Progressing   Problem: Intracerebral Hemorrhage Tissue Perfusion: Goal: Complications of Intracerebral Hemorrhage will be minimized Outcome: Progressing   Problem: Coping: Goal: Will verbalize positive feelings about self Outcome: Progressing Goal: Will identify appropriate support needs Outcome: Progressing   Problem: Health Behavior/Discharge Planning: Goal: Ability to manage health-related needs will improve Outcome: Progressing Goal: Goals will be collaboratively established with patient/family Outcome: Progressing   Problem: Self-Care: Goal: Ability to participate in self-care as condition permits will improve Outcome: Progressing Goal: Verbalization of feelings and concerns over difficulty with self-care will improve Outcome: Progressing Goal: Ability to communicate needs accurately will improve Outcome: Progressing   Problem: Nutrition: Goal: Risk of aspiration will decrease Outcome: Progressing Goal: Dietary intake will improve Outcome: Progressing   Problem: Education: Goal: Ability to describe self-care measures that may prevent or decrease complications (Diabetes Survival Skills Education) will improve Outcome: Progressing Goal: Individualized Educational Video(s) Outcome: Progressing   Problem: Coping: Goal: Ability to adjust to condition or change in health will improve Outcome: Progressing   Problem: Fluid Volume: Goal: Ability to maintain a balanced intake and output will improve Outcome: Progressing   Problem: Health Behavior/Discharge Planning: Goal: Ability to identify and utilize available resources and services will improve Outcome: Progressing Goal:  Ability to manage health-related needs will improve Outcome: Progressing   Problem: Metabolic: Goal: Ability to maintain appropriate glucose levels will improve Outcome: Progressing   Problem: Nutritional: Goal: Maintenance of adequate nutrition will improve Outcome: Progressing Goal: Progress toward achieving an optimal weight will improve Outcome: Progressing   Problem: Skin Integrity: Goal: Risk for impaired skin integrity will decrease Outcome: Progressing   Problem: Tissue Perfusion: Goal: Adequacy of tissue perfusion will improve Outcome: Progressing   Problem: Education: Goal: Knowledge of General Education information will improve Description: Including pain rating scale, medication(s)/side effects and non-pharmacologic comfort measures Outcome: Progressing   Problem: Health Behavior/Discharge Planning: Goal: Ability to manage health-related needs will improve Outcome: Progressing   Problem: Clinical Measurements: Goal: Ability to maintain clinical measurements within normal limits will improve Outcome: Progressing Goal: Will remain free from infection Outcome: Progressing Goal: Diagnostic test results will improve Outcome: Progressing Goal: Respiratory complications will improve Outcome: Progressing Goal: Cardiovascular complication will be avoided Outcome: Progressing   Problem: Activity: Goal: Risk for activity intolerance will decrease Outcome: Progressing   Problem: Nutrition: Goal: Adequate nutrition will be maintained Outcome: Progressing   Problem: Coping: Goal: Level of anxiety will decrease Outcome: Progressing   Problem: Elimination: Goal: Will not experience complications related to bowel motility Outcome: Progressing Goal: Will not experience complications related to urinary retention Outcome: Progressing   Problem: Pain Managment: Goal: General experience of comfort will improve and/or be controlled Outcome: Progressing   Problem:  Safety: Goal: Ability to remain free from injury will improve Outcome: Progressing   Problem: Skin Integrity: Goal: Risk for impaired skin integrity will decrease Outcome: Progressing

## 2024-05-09 NOTE — Progress Notes (Signed)
 Physical Therapy Treatment Patient Details Name: Dustin Ferrell MRN: [id number] DOB: 1945-10-29 Today's Date: 05/09/2024   History of Present Illness Pt is a 78 y.o. male with a minor head injury on top of prior significant head injury. He has history of encephalomalacia affecting the right frontal lobe that was present on prior imaging. Today he shows radiographic evidence of bifrontal contusions.  He additionally has small area of subdural hematoma along the skull base. He previously had a skull fracture which now shows evidence of extension of his fracture posteriorly on the right side through the frontal and parietal bones. CT does reveal some increased subdural collection over the left frontal parietal convexity and more prominent subarachnoid hemorrhage over the posterior parietal and occipital lobes bilaterally, EEG showing no seizure activity.    PT Comments  Pt was long sitting in bed, asleep, upon arrival. PT/OT co-treat 2/2 to pt's need for +2 assistance and for pt/staff safety. Safety sitter discontinued recently. Pt is much less alert/ much more lethargic in presentation today. Unable to keep eyes open even with sternal rub and max encouragement. Attempted total assisted (max +2) to EOB in hopes to improve arousal but pt remained very lethargic with poor ability to follow commands or to  participate. Acute PT will continue to follow and progress as able per current POC. Will return at a later date when pt is more alert.    If plan is discharge home, recommend the following: Two people to help with walking and/or transfers;Two people to help with bathing/dressing/bathroom;Assistance with cooking/housework;Assistance with feeding;Direct supervision/assist for medications management;Direct supervision/assist for financial management;Assist for transportation;Help with stairs or ramp for entrance;Supervision due to cognitive status     Equipment Recommendations  Other (comment) (Defer to  next level of care)       Precautions / Restrictions Precautions Precautions: Fall Recall of Precautions/Restrictions: Impaired Restrictions Weight Bearing Restrictions Per Provider Order: No     Mobility  Bed Mobility Overal bed mobility: Needs Assistance Bed Mobility: Supine to Sit, Sit to Supine  Supine to sit: Total assist, +2 for physical assistance, +2 for safety/equipment Sit to supine: Total assist, +2 for physical assistance, +2 for safety/equipment General bed mobility comments: Hopeful that sitting pt up EOB would improve alertness however but still struggles to stay awake/participate. Does make facial grimace but has poor sititng tolerance.    Transfers  General transfer comment: unsafe to attempt due to poor level of arousal/alertness     Balance Overall balance assessment: Needs assistance Sitting-balance support: Feet supported, Bilateral upper extremity supported Sitting balance-Leahy Scale: Poor Sitting balance - Comments: poor sitting balance today due to poor level of arousal      Communication Communication Communication: Impaired  Cognition Arousal: Alert Behavior During Therapy:  (lethargic)   PT - Cognitive impairments: History of cognitive impairments   PT - Cognition Comments: Pt much more lethargic today versus last time seen by author 2 days prior. PT/OT co-treat, severely limited by inability to participate, stay awake Following commands: Impaired Following commands impaired: Follows one step commands inconsistently    Cueing Cueing Techniques: Verbal cues, Tactile cues         Pertinent Vitals/Pain Pain Assessment Pain Assessment: PAINAD Breathing: normal Negative Vocalization: occasional moan/groan, low speech, negative/disapproving quality Facial Expression: sad, frightened, frown Body Language: tense, distressed pacing, fidgeting Consolability: no need to console PAINAD Score: 3 Pain Intervention(s): Limited activity within  patient's tolerance, Monitored during session, Premedicated before session, Repositioned     PT Goals (  current goals can now be found in the care plan section) Acute Rehab PT Goals Patient Stated Goal: None stated Progress towards PT goals: Not progressing toward goals - comment    Frequency    Min 1X/week       Co-evaluation     PT goals addressed during session: Mobility/safety with mobility;Balance;Proper use of DME;Strengthening/ROM        AM-PAC PT 6 Clicks Mobility   Outcome Measure  Help needed turning from your back to your side while in a flat bed without using bedrails?: A Lot Help needed moving from lying on your back to sitting on the side of a flat bed without using bedrails?: Total Help needed moving to and from a bed to a chair (including a wheelchair)?: Total Help needed standing up from a chair using your arms (e.g., wheelchair or bedside chair)?: Total Help needed to walk in hospital room?: Total Help needed climbing 3-5 steps with a railing? : Total 6 Click Score: 7    End of Session   Activity Tolerance: Patient limited by lethargy Patient left: in bed;with call bell/phone within reach;with bed alarm set;with family/visitor present Nurse Communication: Mobility status PT Visit Diagnosis: Other abnormalities of gait and mobility (R26.89);Unsteadiness on feet (R26.81)     Time: 8573-8562 PT Time Calculation (min) (ACUTE ONLY): 11 min  Charges:    $Therapeutic Activity: 8-22 mins PT General Charges $$ ACUTE PT VISIT: 1 Visit                     [name] Essex PTA 05/09/2024, 3:11 PM

## 2024-05-09 NOTE — Progress Notes (Signed)
°   05/09/24 1725  Spiritual Encounters  Type of Visit Initial  Care provided to: [facility] partners present during encounter Other (comment) (Nurse Tech)  Referral source Clinical staff  Reason for visit Routine spiritual support  OnCall Visit Yes  Interventions  Spiritual Care Interventions Made Established relationship of care and support;Compassionate presence;Reflective listening;Narrative/life review;Encouragement;Decision-making support/facilitation   Chaplain visited with patient's wife and daughter per a spiritual consult request.  Chaplain provided compassionate support and presence, and engaged in supportive and encouraging conversation.  They shared his history with head injuries, and shared their anxieties about his current condition and about having to make some difficult decisions about his care post-hospitalization. They were very complimentary of the care he is receiving.  Offered follow up chaplain visit throughout the hospitalization.

## 2024-05-09 NOTE — Progress Notes (Signed)
 Occupational Therapy Treatment Patient Details Name: Dustin Ferrell MRN: [id number] DOB: 01-03-1946 Today's Date: 05/09/2024   History of present illness Pt is a 78 y.o. male with a minor head injury on top of prior significant head injury. He has history of encephalomalacia affecting the right frontal lobe that was present on prior imaging. Today he shows radiographic evidence of bifrontal contusions.  He additionally has small area of subdural hematoma along the skull base. He previously had a skull fracture which now shows evidence of extension of his fracture posteriorly on the right side through the frontal and parietal bones. CT does reveal some increased subdural collection over the left frontal parietal convexity and more prominent subarachnoid hemorrhage over the posterior parietal and occipital lobes bilaterally, EEG showing no seizure activity.   OT comments  Pt is supine in bed on arrival, lethargic and has been asleep most of the day per nursing. Sitter recently discontinued.  Pt grimaces in pain and mumbles back once seated EOB, then closes eyes again and remains lethargic and unable to follow commands. Total A x2 for all bed mobility and Max A x1 for seated balance. Sensory stimulation techniques attempted as well with pt only briefly opening R eye then closing again. Will re-attempt as able to continued maximizing ability to participate functionally. Pt returned to bed with all needs in place and will cont to require skilled acute OT services to maximize his safety and IND to return to PLOF.       If plan is discharge home, recommend the following:  Assistance with feeding;Assistance with cooking/housework;Direct supervision/assist for medications management;Assist for transportation;Supervision due to cognitive status;Direct supervision/assist for financial management;Two people to help with walking and/or transfers;A lot of help with bathing/dressing/bathroom   Equipment  Recommendations  Other (comment) (defer)    Recommendations for Other Services      Precautions / Restrictions Precautions Precautions: Fall Recall of Precautions/Restrictions: Impaired Restrictions Weight Bearing Restrictions Per Provider Order: No       Mobility Bed Mobility Overal bed mobility: Needs Assistance Bed Mobility: Supine to Sit, Sit to Supine     Supine to sit: Total assist, +2 for physical assistance, +2 for safety/equipment Sit to supine: Total assist, +2 for physical assistance, +2 for safety/equipment   General bed mobility comments: pt lethargic, sleeping most of the day per nursing; attempted stimulation at bed level via sternal run, cold temp, cheek pinching, and smelling applesauce, however pt only opened R eye and closed it again quickly; attempted sitting EOB as well to stimulate alertness requiring total a x2 and still pt mostly lethargic only opening 1 eye even when water spilled on him; grimacing and seemingly mentioned back pain    Transfers                   General transfer comment: deferred d/t poor level of alertness     Balance Overall balance assessment: Needs assistance Sitting-balance support: Feet supported, Bilateral upper extremity supported Sitting balance-Leahy Scale: Poor Sitting balance - Comments: +1-2 max assist d/t lethargy                                   ADL either performed or assessed with clinical judgement   ADL  Extremity/Trunk Assessment              Vision       Haematologist Communication Communication: Impaired   Cognition Arousal: Lethargic Behavior During Therapy:  (lethargic)                                 Following commands: Impaired Following commands impaired: Follows one step commands inconsistently      Cueing   Cueing Techniques: Verbal cues, Tactile cues   Exercises      Shoulder Instructions       General Comments HR up to 128 but mostly around low 100s during session    Pertinent Vitals/ Pain       Pain Assessment Pain Assessment: PAINAD Breathing: normal Negative Vocalization: occasional moan/groan, low speech, negative/disapproving quality Facial Expression: sad, frightened, frown Body Language: tense, distressed pacing, fidgeting Consolability: no need to console PAINAD Score: 3 Pain Intervention(s): Monitored during session, Repositioned  Home Living                                          Prior Functioning/Environment              Frequency  Min 2X/week        Progress Toward Goals  OT Goals(current goals can now be found in the care plan section)  Progress towards OT goals: Progressing toward goals  Acute Rehab OT Goals Patient Stated Goal: none stated OT Goal Formulation: Patient unable to participate in goal setting Time For Goal Achievement: 05/18/24 Potential to Achieve Goals: Good  Plan      Co-evaluation    PT/OT/SLP Co-Evaluation/Treatment: Yes Reason for Co-Treatment: Necessary to address cognition/behavior during functional activity;For patient/therapist safety;To address functional/ADL transfers PT goals addressed during session: Mobility/safety with mobility;Balance;Proper use of DME;Strengthening/ROM OT goals addressed during session: ADL's and self-care;Proper use of Adaptive equipment and DME;Strengthening/ROM      AM-PAC OT 6 Clicks Daily Activity     Outcome Measure   Help from another person eating meals?: A Lot Help from another person taking care of personal grooming?: A Lot Help from another person toileting, which includes using toliet, bedpan, or urinal?: A Lot Help from another person bathing (including washing, rinsing, drying)?: Total Help from another person to put on and taking off regular upper body clothing?: Total Help from another person to  put on and taking off regular lower body clothing?: Total 6 Click Score: 9    End of Session    OT Visit Diagnosis: Unsteadiness on feet (R26.81);Other abnormalities of gait and mobility (R26.89);Repeated falls (R29.6);History of falling (Z91.81);Muscle weakness (generalized) (M62.81)   Activity Tolerance Patient limited by lethargy   Patient Left in bed;with call bell/phone within reach   Nurse Communication Mobility status        Time: 8573-8556 OT Time Calculation (min): 17 min  Charges: OT General Charges $OT Visit: 1 Visit  Angelette Ganus Chrismon, OTR/L  05/09/2024, 5:47 PM   Kimani Bedoya E Chrismon 05/09/2024, 5:44 PM

## 2024-05-09 NOTE — TOC Progression Note (Signed)
 Transition of Care [facility]) - Progression Note    Patient Details  Name: Dustin Ferrell MRN: [id number] Date of Birth: 06-Jul-1945  [facility] CM/SW Contact  Dalia GORMAN Fuse, RN Phone Number: 05/09/2024, 9:55 AM  Clinical Narrative:     No bed offers. Yesterday, the Hospital MD requested that [facility] wait 2 to 3 days and resubmit to SNF with improved PT scores. TOC will continue to follow.                    Expected Discharge Plan and Services                                               Social Drivers of Health (SDOH) Interventions SDOH Screenings   Food Insecurity: No Food Insecurity (05/04/2024)  Housing: Low Risk (05/04/2024)  Transportation Needs: No Transportation Needs (05/04/2024)  Utilities: Not At Risk (05/04/2024)  Financial Resource Strain: Low Risk  (05/03/2024)   Received from [facility]  Social Connections: Unknown (05/04/2024)  Tobacco Use: High Risk (05/03/2024)    Readmission Risk Interventions     No data to display

## 2024-05-09 NOTE — Progress Notes (Signed)
 Progress Note    Dustin Ferrell  FMW:[id number] DOB: [birth year]  DOA: 05/03/2024 PCP: Dustin Ophelia JINNY DOUGLAS, MD      Brief Narrative:    Medical records reviewed and are as summarized below:  Dustin Ferrell is a 78 y.o. 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  There is reports of recent cough, vomiting and URI.  No report offer for though patient has poor memory.  Workup in the ED revealed intracranial hemorrhage.       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   Intracranial hemorrhage, subdural hematoma, subarachnoid hemorrhage likely traumatic: Repeat CT head showed stability in intracranial bleed. MRI brain on 05/08/2024 showed stable intracranial bleed. Patient has been evaluated by Dr. Katrina, neurosurgeon.  Conservative management recommended.   Hypertensive urgency, hypertension: BP is better.  Continue antihypertensives.   Delirium, lethargy, acute toxic metabolic encephalopathy: Patient been getting IV Ativan  as needed for agitation.  Family was concerned that he had become more lethargic.  Lethargy likely due to Ativan .  Ativan  has been discontinued. Nighttime Seroquel  has been discontinued for now.  May have to restart if agitation recurs. Dementia with behavioral  disturbances   Comorbidities include anxiety, history of traumatic brain injury, seizure disorder.   Plan of care was discussed with his wife and Dustin Ferrell, daughter, at the bedside   Diet Order             Diet heart healthy/carb modified Room service appropriate? Yes; Fluid consistency: Thin  Diet effective now                                  Consultants: Neurosurgeon  Procedures: None    Medications:    amLODipine   10 mg Oral Daily   busPIRone   5 mg Oral BID   divalproex   500 mg Oral TID   insulin  aspart  0-9 Units Subcutaneous Q6H   insulin  glargine  10 Units Subcutaneous Daily   lactulose   20 g Oral TID   lisinopril   30 mg Oral Daily   pantoprazole   40 mg Oral QHS   sertraline   200 mg Oral Daily   sodium chloride  flush  10-40 mL Intracatheter Q12H   tamsulosin   0.8 mg Oral QPC breakfast   Continuous Infusions:     Anti-infectives (From admission, onward)    None              Family Communication/Anticipated D/C date and plan/Code Status   DVT prophylaxis: SCDs Start: 05/03/24 1750     Code Status: Limited: Do not attempt resuscitation (DNR) -DNR-LIMITED -Do Not Intubate/DNI   Family Communication: Plan discussed with his wife and daughter, Dustin Ferrell, at the bedside  Disposition Plan: Plan to discharge to SNF   Status is: Inpatient Remains inpatient appropriate because: Delirium  Subjective:   Interval events noted.  Patient is lethargic and unable to provide any history.  His wife and daughter, Dustin Ferrell, at the bedside.  Objective:    Vitals:   05/08/24 1929 05/08/24 2339 05/09/24 0540 05/09/24 0732  BP: (!) 123/59 (!) 101/51 (!) 141/67 123/69  Pulse: 92 90 98 (!) 104  Resp: 19 20 20    Temp: 98.1 F (36.7 C) 97.6 F (36.4 C) 98.9 F (37.2 C) 98.4 F (36.9 C)  TempSrc: Axillary Axillary Axillary Oral  SpO2: 98% 97% 94% 93%  Weight:   69.3 kg   Height:       No data found.   Intake/Output Summary  (Last 24 hours) at 05/09/2024 1652 Last data filed at 05/09/2024 1059 Gross per 24 hour  Intake 0 ml  Output 2 ml  Net -2 ml   Filed Weights   05/08/24 0406 05/08/24 1900 05/09/24 0540  Weight: (!) 138.4 kg 68.9 kg 69.3 kg    Exam:  GEN: NAD SKIN: Warm and dry EYES: EOMI, PERRLA ENT: MMM CV: RRR PULM: CTA B ABD: soft, ND, NT, +BS CNS: Lethargic and does not answer questions EXT: No edema or tenderness         Data Reviewed:   I have personally reviewed following labs and imaging studies:  Labs: Labs show the following:   Basic Metabolic Panel: Recent Labs  Lab 05/03/24 2158 05/04/24 0527 05/04/24 0935 05/05/24 0851 05/06/24 0626 05/07/24 0807 05/09/24 0849  NA 134* 135 137 136 141 146* 152*  K  --  3.9  --  4.3 4.5 3.9 3.5  CL  --  101  --  99 104 106 117*  CO2  --  23  --  20* 22 23 21*  GLUCOSE  --  108*  --  204* 243* 192* 247*  BUN  --  26*  --  33* 41* 65* 62*  CREATININE  --  0.70  --  1.06 1.13 1.57* 1.08  CALCIUM   --  8.9  --  9.7 9.8 9.8 9.5  MG 1.9  --   --   --   --   --  3.3*  PHOS  --   --   --   --   --   --  3.1   GFR Estimated Creatinine Clearance: 47.5 mL/min (by C-G formula based on SCr of 1.08 mg/dL). Liver Function Tests: Recent Labs  Lab 05/03/24 1257 05/05/24 0851 05/06/24 0626 05/07/24 0807 05/09/24 0849  AST 23 51* 38 44*  --   ALT 23 29 26  32  --   ALKPHOS 61 81 76 76  --   BILITOT 0.6 0.6 0.7 0.6  --   PROT 7.2 7.2 7.3 7.1  --   ALBUMIN 4.3 3.9 3.9 3.7 3.0*   No results for input(s): LIPASE, AMYLASE in the last 168 hours. Recent Labs  Lab 05/07/24 1155  AMMONIA 45*   Coagulation profile No results for input(s): INR, PROTIME in the last 168 hours.  CBC: Recent Labs  Lab 05/03/24 1257 05/04/24 0527 05/05/24 1119 05/06/24 0626 05/07/24 0807 05/09/24 0849  WBC 8.8 6.7 13.5* 13.5* 12.6* 10.2  NEUTROABS 6.6  --  8.8*  --  9.7*  --   HGB 12.2* 11.1* 14.8 15.5 14.0 12.8*  HCT 35.9* 33.4* 42.6  44.1 42.9 39.5  MCV 90.2 92.0 88.0 87.8 93.1 92.1  PLT 177 155 281 289 297 234   Cardiac Enzymes: No results for input(s): CKTOTAL, CKMB, CKMBINDEX,  TROPONINI in the last 168 hours. BNP (last 3 results) No results for input(s): PROBNP in the last 8760 hours. CBG: Recent Labs  Lab 05/08/24 1644 05/08/24 2343 05/09/24 0053 05/09/24 0636 05/09/24 1321  GLUCAP 277* 244* 199* 229* 205*   D-Dimer: No results for input(s): DDIMER in the last 72 hours. Hgb A1c: No results for input(s): HGBA1C in the last 72 hours. Lipid Profile: No results for input(s): CHOL, HDL, LDLCALC, TRIG, CHOLHDL, LDLDIRECT in the last 72 hours. Thyroid function studies: No results for input(s): TSH, T4TOTAL, T3FREE, THYROIDAB in the last 72 hours.  Invalid input(s): FREET3 Anemia work up: No results for input(s): VITAMINB12, FOLATE, FERRITIN, TIBC, IRON, RETICCTPCT in the last 72 hours. Sepsis Labs: Recent Labs  Lab 05/05/24 1119 05/06/24 0626 05/07/24 0807 05/09/24 0849  WBC 13.5* 13.5* 12.6* 10.2    Microbiology Recent Results (from the past 240 hours)  Resp panel by RT-PCR (RSV, Flu A&B, Covid) Anterior Nasal Swab     Status: None   Collection Time: 05/06/24  3:26 PM   Specimen: Anterior Nasal Swab  Result Value Ref Range Status   SARS Coronavirus 2 by RT PCR NEGATIVE NEGATIVE Final    Comment: (NOTE) SARS-CoV-2 target nucleic acids are NOT DETECTED.  The SARS-CoV-2 RNA is generally detectable in upper respiratory specimens during the acute phase of infection. The lowest concentration of SARS-CoV-2 viral copies this assay can detect is 138 copies/mL. A negative result does not preclude SARS-Cov-2 infection and should not be used as the sole basis for treatment or other patient management decisions. A negative result may occur with  improper specimen collection/handling, submission of specimen other than nasopharyngeal swab, presence of viral  mutation(s) within the areas targeted by this assay, and inadequate number of viral copies(<138 copies/mL). A negative result must be combined with clinical observations, patient history, and epidemiological information. The expected result is Negative.  Fact Sheet for Patients:  bloggercourse.com  Fact Sheet for Healthcare Providers:  seriousbroker.it  This test is no t yet approved or cleared by the [place]  FDA and  has been authorized for detection and/or diagnosis of SARS-CoV-2 by FDA under an Emergency Use Authorization (EUA). This EUA will remain  in effect (meaning this test can be used) for the duration of the COVID-19 declaration under Section 564(b)(1) of the Act, 21 U.S.C.section 360bbb-3(b)(1), unless the authorization is terminated  or revoked sooner.       Influenza A by PCR NEGATIVE NEGATIVE Final   Influenza B by PCR NEGATIVE NEGATIVE Final    Comment: (NOTE) The Xpert Xpress SARS-CoV-2/FLU/RSV plus assay is intended as an aid in the diagnosis of influenza from Nasopharyngeal swab specimens and should not be used as a sole basis for treatment. Nasal washings and aspirates are unacceptable for Xpert Xpress SARS-CoV-2/FLU/RSV testing.  Fact Sheet for Patients: bloggercourse.com  Fact Sheet for Healthcare Providers: seriousbroker.it  This test is not yet approved or cleared by the [place]  FDA and has been authorized for detection and/or diagnosis of SARS-CoV-2 by FDA under an Emergency Use Authorization (EUA). This EUA will remain in effect (meaning this test can be used) for the duration of the COVID-19 declaration under Section 564(b)(1) of the Act, 21 U.S.C. section 360bbb-3(b)(1), unless the authorization is terminated or revoked.     Resp Syncytial Virus by PCR NEGATIVE NEGATIVE Final    Comment: (NOTE) Fact Sheet for  Patients: bloggercourse.com  Fact Sheet for Healthcare Providers: seriousbroker.it  This test is not yet approved or cleared  by the [place]  FDA and has been authorized for detection and/or diagnosis of SARS-CoV-2 by FDA under an Emergency Use Authorization (EUA). This EUA will remain in effect (meaning this test can be used) for the duration of the COVID-19 declaration under Section 564(b)(1) of the Act, 21 U.S.C. section 360bbb-3(b)(1), unless the authorization is terminated or revoked.  Performed at [facility], [address] [place], [place] [address]   Respiratory (~20 pathogens) panel by PCR     Status: None   Collection Time: 05/06/24  3:26 PM   Specimen: Nasopharyngeal Swab; Respiratory  Result Value Ref Range Status   Adenovirus NOT DETECTED NOT DETECTED Final   Coronavirus 229E NOT DETECTED NOT DETECTED Final    Comment: (NOTE) The Coronavirus on the Respiratory Panel, DOES NOT test for the novel  Coronavirus (2019 nCoV)    Coronavirus HKU1 NOT DETECTED NOT DETECTED Final   Coronavirus NL63 NOT DETECTED NOT DETECTED Final   Coronavirus OC43 NOT DETECTED NOT DETECTED Final   Metapneumovirus NOT DETECTED NOT DETECTED Final   Rhinovirus / Enterovirus NOT DETECTED NOT DETECTED Final   Influenza A NOT DETECTED NOT DETECTED Final   Influenza B NOT DETECTED NOT DETECTED Final   Parainfluenza Virus 1 NOT DETECTED NOT DETECTED Final   Parainfluenza Virus 2 NOT DETECTED NOT DETECTED Final   Parainfluenza Virus 3 NOT DETECTED NOT DETECTED Final   Parainfluenza Virus 4 NOT DETECTED NOT DETECTED Final   Respiratory Syncytial Virus NOT DETECTED NOT DETECTED Final   Bordetella pertussis NOT DETECTED NOT DETECTED Final   Bordetella Parapertussis NOT DETECTED NOT DETECTED Final   Chlamydophila pneumoniae NOT DETECTED NOT DETECTED Final   Mycoplasma pneumoniae NOT DETECTED NOT DETECTED Final    Comment: Performed at  [facility], [address], [place], [place] [address]    Procedures and diagnostic studies:  ECHOCARDIOGRAM COMPLETE Result Date: 05/09/2024    ECHOCARDIOGRAM REPORT   Patient Name:   AREND BAHL Date of Exam: 05/08/2024 Medical Rec #:  [id number]        Height:       65.0 in Accession #:    [id number]       Weight:       151.9 lb Date of Birth:  [birth year]        BSA:          1.760 m Patient Age:    78 years         BP:           123/59 mmHg Patient Gender: M                HR:           94 bpm. Exam Location:  [facility] Procedure: 2D Echo, Cardiac Doppler and Color Doppler (Both Spectral and Color            Flow Doppler were utilized during procedure). Indications:     I48.91 Atrial Fibrillation  History:         Patient has prior history of Echocardiogram examinations, most                  recent 10/09/2014. Risk Factors:Diabetes and Hypertension.  Sonographer:     Carl Coma RDCS Referring Phys:  [id number] ALEXANDRA DEZII Diagnosing Phys: Marsa Dooms MD IMPRESSIONS  1. Left ventricular ejection fraction, by estimation, is 50 to 55%. The left ventricle has low normal function. The left ventricle has no regional wall motion abnormalities. Left  ventricular diastolic parameters are consistent with Grade I diastolic dysfunction (impaired relaxation).  2. Right ventricular systolic function is normal. The right ventricular size is normal.  3. The mitral valve is normal in structure. Mild mitral valve regurgitation. No evidence of mitral stenosis.  4. The aortic valve is normal in structure. Aortic valve regurgitation is not visualized. No aortic stenosis is present.  5. The inferior vena cava is normal in size with greater than 50% respiratory variability, suggesting right atrial pressure of 3 mmHg. FINDINGS  Left Ventricle: Left ventricular ejection fraction, by estimation, is 50 to 55%. The left ventricle has low normal function. The left ventricle has no regional wall motion  abnormalities. Strain was performed and the global longitudinal strain is indeterminate. The left ventricular internal cavity size was normal in size. There is no left ventricular hypertrophy. Left ventricular diastolic parameters are consistent with Grade I diastolic dysfunction (impaired relaxation). Right Ventricle: The right ventricular size is normal. No increase in right ventricular wall thickness. Right ventricular systolic function is normal. Left Atrium: Left atrial size was normal in size. Right Atrium: Right atrial size was normal in size. Pericardium: There is no evidence of pericardial effusion. Mitral Valve: The mitral valve is normal in structure. Mild mitral valve regurgitation. No evidence of mitral valve stenosis. Tricuspid Valve: The tricuspid valve is normal in structure. Tricuspid valve regurgitation is mild . No evidence of tricuspid stenosis. Aortic Valve: The aortic valve is normal in structure. Aortic valve regurgitation is not visualized. No aortic stenosis is present. Pulmonic Valve: The pulmonic valve was normal in structure. Pulmonic valve regurgitation is not visualized. No evidence of pulmonic stenosis. Aorta: The aortic root is normal in size and structure. Venous: The inferior vena cava is normal in size with greater than 50% respiratory variability, suggesting right atrial pressure of 3 mmHg. IAS/Shunts: No atrial level shunt detected by color flow Doppler. Additional Comments: 3D was performed not requiring image post processing on an independent workstation and was indeterminate.  LEFT VENTRICLE PLAX 2D LVIDd:         3.30 cm   Diastology LVIDs:         2.40 cm   LV e' medial:    5.38 cm/s LV PW:         1.20 cm   LV E/e' medial:  8.9 LV IVS:        1.40 cm   LV e' lateral:   9.24 cm/s LVOT diam:     2.10 cm   LV E/e' lateral: 5.2 LV SV:         43 LV SV Index:   24 LVOT Area:     3.46 cm  RIGHT VENTRICLE             IVC RV Basal diam:  3.00 cm     IVC diam: 1.40 cm RV S prime:      10.63 cm/s TAPSE (M-mode): 1.5 cm LEFT ATRIUM             Index        RIGHT ATRIUM          Index LA diam:        4.00 cm 2.27 cm/m   RA Area:     7.66 cm LA Vol (A2C):   25.1 ml 14.26 ml/m  RA Volume:   15.30 ml 8.69 ml/m LA Vol (A4C):   33.8 ml 19.21 ml/m LA Biplane Vol: 30.0 ml 17.05 ml/m  AORTIC VALVE LVOT Vmax:  85.13 cm/s LVOT Vmean:  56.167 cm/s LVOT VTI:    0.124 m  AORTA Ao Root diam: 4.00 cm Ao Asc diam:  3.20 cm MITRAL VALVE MV Area (PHT): 4.14 cm    SHUNTS MV Decel Time: 183 msec    Systemic VTI:  0.12 m MV E velocity: 47.97 cm/s  Systemic Diam: 2.10 cm MV A velocity: 99.43 cm/s MV E/A ratio:  0.48 Marsa Dooms MD Electronically signed by Marsa Dooms MD Signature Date/Time: 05/09/2024/7:38:10 AM    Final    MR BRAIN WO CONTRAST Result Date: 05/09/2024 EXAM: MRI BRAIN WITHOUT CONTRAST 05/08/2024 10:59:04 PM TECHNIQUE: Multiplanar multisequence MRI of the head/brain was performed without the administration of intravenous contrast. COMPARISON: MR Head without contrast 08/17/2023. CT head 05/05/2024 and earlier. CLINICAL HISTORY: 78 year old male with history of intracranial hemorrhage and trauma. FINDINGS: BRAIN AND VENTRICLES: Asymmetric subdural hematomas, along the left convexity measuring 6 to 7 mm in thickness (series 12 image 37 and series 11 image 16) and along the right convexity measuring 2 mm in thickness. Mild rightward midline shift of roughly 3 mm. Superimposed anterior bifrontal hemorrhagic contusions, right greater than left. Smaller acute hemorrhagic contusions in the anterior left temporal lobe (series 12 image 15 and series 14 image 22). Small volume of intraventricular hemorrhage most apparent in the occipital horns (series 14 image 34). Scattered bilateral subarachnoid hemorrhage (series 14 image 62, series 12 image 41). Furthermore, small volume bilateral more symmetric posterior fossa subdural blood on series 12 image 11, 2 mm in thickness. Bifrontal  cerebral edema. No significant mass effect on the frontal horns. Underlying chronic encephalomalacia in the anterior right frontal lobe. No ventriculomegaly, stable ventricle size and configuration. Effaced suprasellar cistern. Other basilar cisterns are stable from earlier this year. Blood related susceptibility artifact on DWI. No areas of diffusion restriction strongly suggestive of superimposed acute infarct. No mass. Normal flow voids. ORBITS: No acute abnormality. SINUSES AND MASTOIDS: Mild paranasal sinus and mastoid opacification has not significantly changed from the MRI earlier this year. BONES AND SOFT TISSUES: Normal marrow signal. Stable visible cervical spine degeneration including ligamentous hypertrophy about the odontoid. Small volume retained secretions in the pharynx. IMPRESSION: 1. Multifocal posttraumatic intracranial hemorrhage not significantly changed from 05/05/2024 CT head: Subdural hematomas - left convexity (6-7 mm), right convexity (2 mm), and bilateral posterior fossa measuring 2 mm. Right > left anterior frontal lobe and left anterior temporal lobe Hemorrhagic contusions. Scattered bilateral Subarachnoid hemorrhages. Small volume intraventricular hemorrhagic hemorrhage. 2. Effaced suprasellar cistern and mild rightward midline shift (3 mm). No other significant intracranial mass effect. Basilar cisterns are patent. 3. No ventriculomegaly. No superimposed acute infarct. No other complicating features. Electronically signed by: Helayne Hurst MD 05/09/2024 04:02 AM EST RP Workstation: [id number]               LOS: 6 days   Dezzie Badilla  [facility]   Pager on [email or web address]. If 7PM-7AM, please contact night-coverage at [email or web address]     05/09/2024, 4:52 PM

## 2024-05-10 LAB — GLUCOSE, CAPILLARY
Glucose-Capillary: 112 mg/dL — ABNORMAL HIGH (ref 70–99)
Glucose-Capillary: 148 mg/dL — ABNORMAL HIGH (ref 70–99)
Glucose-Capillary: 175 mg/dL — ABNORMAL HIGH (ref 70–99)
Glucose-Capillary: 243 mg/dL — ABNORMAL HIGH (ref 70–99)
Glucose-Capillary: 268 mg/dL — ABNORMAL HIGH (ref 70–99)
Glucose-Capillary: 290 mg/dL — ABNORMAL HIGH (ref 70–99)
Glucose-Capillary: 321 mg/dL — ABNORMAL HIGH (ref 70–99)
Glucose-Capillary: 336 mg/dL — ABNORMAL HIGH (ref 70–99)
Glucose-Capillary: 358 mg/dL — ABNORMAL HIGH (ref 70–99)
Glucose-Capillary: 405 mg/dL — ABNORMAL HIGH (ref 70–99)

## 2024-05-10 LAB — BASIC METABOLIC PANEL WITH GFR
Anion gap: 18 — ABNORMAL HIGH (ref 5–15)
BUN: 63 mg/dL — ABNORMAL HIGH (ref 8–23)
CO2: 19 mmol/L — ABNORMAL LOW (ref 22–32)
Calcium: 9.5 mg/dL (ref 8.9–10.3)
Chloride: 122 mmol/L — ABNORMAL HIGH (ref 98–111)
Creatinine, Ser: 1.05 mg/dL (ref 0.61–1.24)
GFR, Estimated: >60 mL/min (ref >60)
Glucose, Bld: 223 mg/dL — ABNORMAL HIGH (ref 70–99)
Potassium: 3.9 mmol/L (ref 3.5–5.1)
Sodium: 158 mmol/L — ABNORMAL HIGH (ref 135–145)

## 2024-05-10 LAB — SODIUM
Sodium: 159 mmol/L — ABNORMAL HIGH (ref 135–145)
Sodium: 161 mmol/L (ref 135–145)

## 2024-05-10 MED ORDER — DIVALPROEX SODIUM 125 MG PO CSDR
500.0000 mg | DELAYED_RELEASE_CAPSULE | Freq: Three times a day (TID) | ORAL | Status: DC
  Administered 2024-05-10 – 2024-05-13 (×7): 500 mg via ORAL
  Filled 2024-05-10 (×13): qty 4

## 2024-05-10 MED ORDER — DIVALPROEX SODIUM 125 MG PO CSDR
500.0000 mg | DELAYED_RELEASE_CAPSULE | Freq: Three times a day (TID) | ORAL | Status: DC
  Filled 2024-05-10: qty 4

## 2024-05-10 MED ORDER — CHLORHEXIDINE GLUCONATE CLOTH 2 % EX PADS
6.0000 | MEDICATED_PAD | Freq: Every day | CUTANEOUS | Status: DC
  Administered 2024-05-10 – 2024-05-14 (×5): 6 via TOPICAL

## 2024-05-10 MED ORDER — INSULIN REGULAR(HUMAN) IN NACL 100-0.9 UT/100ML-% IV SOLN
INTRAVENOUS | Status: DC
  Administered 2024-05-10: 10.5 [IU]/h via INTRAVENOUS
  Filled 2024-05-10: qty 100

## 2024-05-10 MED ORDER — DEXTROSE 5 % IV SOLN: INTRAVENOUS | Status: AC

## 2024-05-10 MED ORDER — INSULIN ASPART 100 UNIT/ML IJ SOLN
0.0000 [IU] | Freq: Three times a day (TID) | INTRAMUSCULAR | Status: DC
  Administered 2024-05-10: 11 [IU] via SUBCUTANEOUS
  Administered 2024-05-10: 15 [IU] via SUBCUTANEOUS
  Filled 2024-05-10: qty 11
  Filled 2024-05-10: qty 15

## 2024-05-10 MED ORDER — SODIUM CHLORIDE 0.45 % IV SOLN: INTRAVENOUS | Status: DC

## 2024-05-10 MED ORDER — QUETIAPINE FUMARATE 25 MG PO TABS: 50.0000 mg | ORAL_TABLET | Freq: Once | ORAL | Status: DC

## 2024-05-10 MED ORDER — QUETIAPINE FUMARATE 25 MG PO TABS
25.0000 mg | ORAL_TABLET | Freq: Every day | ORAL | Status: DC
  Administered 2024-05-12: 25 mg via ORAL
  Filled 2024-05-10: qty 1

## 2024-05-10 MED ORDER — QUETIAPINE FUMARATE 25 MG PO TABS
37.5000 mg | ORAL_TABLET | Freq: Once | ORAL | Status: AC
  Administered 2024-05-10: 37.5 mg via ORAL
  Filled 2024-05-10: qty 2

## 2024-05-10 MED ADMIN — Haloperidol Lactate Inj 5 MG/ML: 2 mg | INTRAMUSCULAR | NDC 67457042600

## 2024-05-10 MED FILL — Haloperidol Lactate Inj 5 MG/ML: 2.0000 mg | INTRAMUSCULAR | Qty: 1 | Status: AC

## 2024-05-10 MED FILL — Haloperidol Lactate Inj 5 MG/ML: 2.0000 mg | INTRAMUSCULAR | Qty: 1 | Status: CN

## 2024-05-10 NOTE — Progress Notes (Signed)
 Neurosurgery Progress Note  History: [name] is here for traumatic brain injury  HD7: Some slight improvements, was able to eat some. HD4: Has continued agitation.   HD3: Agitated yesterday and overnight.  HD2: No issues overnight.  He still has a headache.  Otherwise he has no complaints  Physical Exam: Vitals:   05/10/24 0552 05/10/24 0741  BP: 132/73 127/76  Pulse: 96 98  Resp: (!) 22 16  Temp: 97.8 F (36.6 C) (!) 96.7 F (35.9 C)  SpO2: (!) 86% 99%   OE spontaneously, regards, does not follow commands CNI  Strength:Moves all extremities purposefully  Data:  Other tests/results:  Sodium stable  Assessment/Plan:  [name] is here with traumatic brain injury with bifrontal contusions, subdural hematoma, and subarachnoid hemorrhage.  - mobilize - pain control - DVT prophylaxis  - PTOT - Maintain sodium above 135 and continue antiepileptic medication - May need NGT - discussed with wife and daughters. They are currently opposed.  MRI shows bifrontal injuries.  He may recover, but may be an extended recovery.   [name] MD, [facility] Department of Neurosurgery

## 2024-05-10 NOTE — Inpatient Diabetes Management (Signed)
 Inpatient Diabetes Program Recommendations  AACE/ADA: New Consensus Statement on Inpatient Glycemic Control   Target Ranges:  Prepandial:   less than 140 mg/dL      Peak postprandial:   less than 180 mg/dL (1-2 hours)      Critically ill patients:  140 - 180 mg/dL    Latest Reference Range & Units 05/09/24 06:36 05/09/24 13:21 05/09/24 18:03 05/10/24 00:14 05/10/24 05:57 05/10/24 07:45  Glucose-Capillary 70 - 99 mg/dL 770 (H) 794 (H) 751 (H) 290 (H) 243 (H) 175 (H)  (H): Data is abnormally high Review of Glycemic Control  Diabetes history: DM2 Outpatient Diabetes medications: Tresiba  23 units daily, Januvia  100 mg daily Current orders for Inpatient glycemic control: Lantus  10 units daily, Novolog  0-9 units Q6H   Inpatient Diabetes Program Recommendations:     Insulin : Please consider increasing Lantus  to 13 units daily.  Thanks, Earnie Gainer, RN, MSN, CDCES Diabetes Coordinator Inpatient Diabetes Program [phone] (Team Pager from 8am to 5pm)

## 2024-05-10 NOTE — Progress Notes (Signed)
 eLink Physician-Brief Progress Note Patient Name: RYON LAYTON DOB: 1945-09-24 MRN: [id number]   Date of Service  05/10/2024  HPI/Events of Note  Patient admitted originally with multiple traumatic intracranial hemorrhages and transferred to the ICU for close monitoring and treatment of hypernatremia and hyperglycemia.  eICU Interventions  New Patient Evaluation.        Maylin Freeburg U Breck Hollinger 05/10/2024, 8:37 PM

## 2024-05-10 NOTE — Progress Notes (Signed)
 PT Cancellation Note  Patient Details Name: Dustin Ferrell MRN: [id number] DOB: 10/01/1945   Cancelled Treatment:     PT attempt. Pt's spouse and daughter at bedside. Pt is much more alert than observed previous date. Family assisting with feeding pt currently. Will return shortly to progress pt with OOB activity as to tolerated.    [name] KATHEE Essex 05/10/2024, 9:37 AM

## 2024-05-10 NOTE — Progress Notes (Signed)
 Physical Therapy Treatment Patient Details Name: Dustin Ferrell MRN: [id number] DOB: 1946-05-11 Today's Date: 05/10/2024   History of Present Illness Pt is a 78 y.o. male with a minor head injury on top of prior significant head injury. He has history of encephalomalacia affecting the right frontal lobe that was present on prior imaging. Today he shows radiographic evidence of bifrontal contusions.  He additionally has small area of subdural hematoma along the skull base. He previously had a skull fracture which now shows evidence of extension of his fracture posteriorly on the right side through the frontal and parietal bones. CT does reveal some increased subdural collection over the left frontal parietal convexity and more prominent subarachnoid hemorrhage over the posterior parietal and occipital lobes bilaterally, EEG showing no seizure activity.    PT Comments  Pt was supine in bed with sitter and RN at bedside. He is alert but remains disoriented. Baseline TBI with cognitive impairments however pt is far from baseline cognition. Pt remains fidgeting/ restless throughout session however pt was issued haldol  just prior to PT session beginning. Pt unaware he had loose BM in bed. He requires constant +2 assistance for safety due to impulsivity and his poor awareness. Pt has made minimal progress towards goals due to level of awareness/overall cognitive state. Acute PT will continue efforts to progress pt as able per current POC. Current recs for STR however pt may need TLC placement if unable to demonstrate improvements.    If plan is discharge home, recommend the following: Two people to help with walking and/or transfers;Two people to help with bathing/dressing/bathroom;Assistance with cooking/housework;Assistance with feeding;Direct supervision/assist for medications management;Direct supervision/assist for financial management;Assist for transportation;Help with stairs or ramp for  entrance;Supervision due to cognitive status     Equipment Recommendations  Other (comment) (Defer to next level of care)       Precautions / Restrictions Precautions Precautions: Fall Recall of Precautions/Restrictions: Impaired Restrictions Weight Bearing Restrictions Per Provider Order: No     Mobility  Bed Mobility Overal bed mobility: Needs Assistance Bed Mobility: Supine to Sit, Sit to Supine Rolling: Max assist, +2 for safety/equipment, Used rails Supine to sit: Max assist, +2 for physical assistance, Used rails Sit to supine: Max assist, +2 for safety/equipment, Used rails General bed mobility comments: Pt is unable to follow commands. requires max assist of one with 2nd person for safety. Pt 's cognition greatly impacts session progress    Transfers Overall transfer level: Needs assistance Equipment used: None (author blocked pt's knees and supported under pt's axillas) Transfers: Sit to/from Stand Sit to Stand: Total assist, +2 safety/equipment, From elevated surface  General transfer comment: Attempted to stand pt EOB while RN perform hygiene care after loose BM. per RN, 4th BM today. pt poor ability to maintain standing. Total assist. elected to return pt to bed and perform hygiene care by rolling pt side to side. One person has to hold arms to prevent him from pulling lines/ placing hands in his poop    Ambulation/Gait  General Gait Details: unsafe to attempt due to impulsivity and poor ability to follow commands    Balance Overall balance assessment: Needs assistance Sitting-balance support: Feet supported, Bilateral upper extremity supported Sitting balance-Leahy Scale: Fair     Standing balance support: During functional activity, Bilateral upper extremity supported Standing balance-Leahy Scale: Zero Standing balance comment: unable to safely stand at this time       Communication Communication Communication: Impaired Factors Affecting Communication:  Difficulty expressing self;Reduced clarity  of speech  Cognition Arousal: Alert Behavior During Therapy: Restless, Anxious, Impulsive   PT - Cognitive impairments: History of cognitive impairments    PT - Cognition Comments: Pt is alert but not oriented. occasional will respond to name but mostly looking off into distance. Poor ability to follow commands Following commands: Impaired Following commands impaired: Follows one step commands inconsistently    Cueing Cueing Techniques: Gestural cues, Verbal cues, Tactile cues, Visual cues         Pertinent Vitals/Pain Pain Assessment Breathing: noisy labored breathing, long periods of hyperventilation, Cheyne-Stokes respirations Negative Vocalization: occasional moan/groan, low speech, negative/disapproving quality Facial Expression: sad, frightened, frown Body Language: tense, distressed pacing, fidgeting Consolability: distracted or reassured by voice/touch PAINAD Score: 6 Pain Intervention(s): Other (comment) (Haldol  given at beginning of session)     PT Goals (current goals can now be found in the care plan section) Acute Rehab PT Goals Patient Stated Goal: None stated Progress towards PT goals: Not progressing toward goals - comment    Frequency    Min 1X/week       Co-evaluation     PT goals addressed during session: Mobility/safety with mobility;Balance;Proper use of DME;Strengthening/ROM        AM-PAC PT 6 Clicks Mobility   Outcome Measure  Help needed turning from your back to your side while in a flat bed without using bedrails?: A Lot Help needed moving from lying on your back to sitting on the side of a flat bed without using bedrails?: Total Help needed moving to and from a bed to a chair (including a wheelchair)?: Total Help needed standing up from a chair using your arms (e.g., wheelchair or bedside chair)?: Total Help needed to walk in hospital room?: Total Help needed climbing 3-5 steps with a  railing? : Total 6 Click Score: 7    End of Session   Activity Tolerance: Treatment limited secondary to agitation;Other (comment) (limited by hx of TBI/poor ability to follow commands/ restlessness) Patient left: in bed;with call bell/phone within reach;with bed alarm set;with nursing/sitter in room;Other (comment) (sitter had to be resumed) Nurse Communication: Mobility status PT Visit Diagnosis: Other abnormalities of gait and mobility (R26.89);Unsteadiness on feet (R26.81)     Time: 1350-1404 PT Time Calculation (min) (ACUTE ONLY): 14 min  Charges:    $Therapeutic Activity: 8-22 mins PT General Charges $$ ACUTE PT VISIT: 1 Visit                     [name] Essex PTA 05/10/2024, 2:20 PM

## 2024-05-10 NOTE — Progress Notes (Addendum)
 Progress Note    Dustin Ferrell  FMW:[id number] DOB: 21-Sep-1945  DOA: 05/03/2024 PCP: [name], MD      Brief Narrative:    Medical records reviewed and are as summarized below:  Dustin Ferrell is a 78 y.o. 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  There is reports of recent cough, vomiting and URI.  No report offer for though patient has poor memory.  Workup in the ED revealed intracranial hemorrhage.       Assessment/Plan:   Principal Problem:   Intracranial hemorrhage (HCC) Active Problems:   Diabetes mellitus due to underlying condition without complication, with long-term current use of insulin  (HCC)   Dementia without behavioral disturbance (HCC)   Hypertension associated with diabetes (HCC)   Hx of traumatic brain injury   Seizures (HCC)   Intraparenchymal hemorrhage of brain (HCC)   Subdural hematoma (HCC)    Body mass index is 24.8 kg/m.   Intracranial hemorrhage, subdural hematoma, subarachnoid hemorrhage likely traumatic: Repeat CT head showed stability in intracranial bleed. MRI brain on 05/08/2024 showed stable intracranial bleed. Patient has been evaluated by Dr. Katrina, neurosurgeon.  Conservative management recommended.  Dr. Katrina has signed off the case.   Hypertensive urgency, hypertension: BP stable.  Continue antihypertensives.   Hypernatremia: Sodium up to 158.  Start half-normal saline at 150 mL/h.  Monitor BMP closely. Dr. Katrina had advised against very hypotonic fluids so we will avoid 5% dextrose infusion.  Dextrose infusion will also be avoided for now because of hyperglycemia.   Delirium, recent lethargy, acute toxic metabolic encephalopathy: Patient is  more alert but has become agitated again.  Restart Seroquel .  Use IM Haldol  as needed for agitation. Recent lethargy was attributed to Ativan  which was discontinued on 05/09/2024. Dementia with behavioral disturbances   Type II DM with hyperglycemia: Continue Lantus  10 units daily.  Increase NovoLog  sliding scale for mild to moderate scale.   Comorbidities include anxiety, history of traumatic brain injury, seizure disorder.   Plan of care was discussed with Dr. Barnie Jenkins, daughter, over the phone     ADDENDUM  Repeat sodium came back at 159.  Given lack of improvement in hyponatremia, patient will be started on IV dextrose infusion.  Of note, patient has also been hyperglycemic.  IV dextrose infusion may exacerbate hyperglycemia.  Therefore, patient will be started on IV insulin  drip.  Patient will also be transferred to stepdown unit for close monitoring.  Case was discussed with Dr. Katrina, neurosurgeon on-call.  He is agreeable with IV dextrose infusion and the plan of care.  He recommended much more frequent monitoring of sodium level (about every 2 hours) and transfer to ICU level of care.  Consulted intensivist.  Case was discussed with Inge, NP, with ICU team.  Consulted nephrologist, Dr. Dennise, to assist with management  Joesph, RN has been updated.  Plan of care was also discussed with Dr. Barnie Jenkins, (daughter) over the phone. She is agreeable with the plan.   CRITICAL CARE Performed by: AIDA CHO   Total critical care time: 40 minutes  Critical care time was exclusive of separately billable procedures and treating other patients.  Critical care was necessary to treat or prevent imminent or life-threatening deterioration.  Critical  care was time spent personally by me on the following activities: development of treatment plan with patient and/or surrogate as well as nursing, discussions with consultants, evaluation of patient's response to  treatment, examination of patient, obtaining history from patient or surrogate, ordering and performing treatments and interventions, ordering and review of laboratory studies, ordering and review of radiographic studies, pulse oximetry and re-evaluation of patient's condition.    Diet Order             Diet heart healthy/carb modified Room service appropriate? Yes; Fluid consistency: Thin  Diet effective now                                  Consultants: Neurosurgeon  Procedures: None    Medications:    amLODipine   10 mg Oral Daily   busPIRone   5 mg Oral BID   divalproex   500 mg Oral TID   insulin  aspart  0-15 Units Subcutaneous TID WC   insulin  glargine  10 Units Subcutaneous Daily   lactulose   20 g Oral TID   lisinopril   30 mg Oral Daily   pantoprazole   40 mg Oral QHS   [START ON 05/11/2024] QUEtiapine   25 mg Oral QHS   sertraline   200 mg Oral Daily   sodium chloride  flush  10-40 mL Intracatheter Q12H   tamsulosin   0.8 mg Oral QPC breakfast   Continuous Infusions:  sodium chloride         Anti-infectives (From admission, onward)    None              Family Communication/Anticipated D/C date and plan/Code Status   DVT prophylaxis: SCDs Start: 05/03/24 1750     Code Status: Limited: Do not attempt resuscitation (DNR) -DNR-LIMITED -Do Not Intubate/DNI   Family Communication: Plan of care was discussed with Dr. Barnie Jenkins, daughter, over the phone   Disposition Plan: Plan to discharge to SNF   Status is: Inpatient Remains inpatient appropriate because: Delirium       Subjective:   Interval events noted.  He is confused and agitated unable to provide any history.  Joesph, RN, reported that patient had been pulling out telemetry leads.  One-to-one sitter at the bedside.  Objective:    Vitals:   05/10/24 0552 05/10/24 0741 05/10/24 0745 05/10/24 1201  BP: 132/73 127/76  112/80  Pulse: 96 98  (!) 108  Resp: (!) 22  16  16   Temp: 97.8 F (36.6 C) (!) 96.7 F (35.9 C) (!) 97.2 F (36.2 C) (!) 97.5 F (36.4 C)  TempSrc:  Oral Oral Oral  SpO2: (!) 86% 99%    Weight:      Height:       No data found.   Intake/Output Summary (Last 24 hours) at 05/10/2024 1215 Last data filed at 05/10/2024 0900 Gross per 24 hour  Intake 120 ml  Output --  Net 120 ml   Filed Weights   05/08/24 1900 05/09/24 0540 05/10/24 0500  Weight: 68.9 kg 69.3 kg 67.6 kg    Exam:  GEN: NAD SKIN: Warm and dry EYES: No pallor or icterus ENT: MMM CV: RRR PULM: CTA B ABD: soft, ND, NT, +BS CNS: Alert but confused, non focal EXT: No edema or tenderness       Data Reviewed:   I have personally reviewed following labs and imaging studies:  Labs: Labs show the following:   Basic Metabolic Panel: Recent Labs  Lab 05/03/24 2158 05/04/24 0527 05/05/24 0851 05/06/24 0626 05/07/24 0807 05/09/24 0849 05/10/24 0909  NA 134*   < > 136 141 146* 152* 158*  K  --    < > 4.3 4.5 3.9 3.5 3.9  CL  --    < > 99 104 106 117* 122*  CO2  --    < > 20* 22 23 21* 19*  GLUCOSE  --    < > 204* 243* 192* 247* 223*  BUN  --    < > 33* 41* 65* 62* 63*  CREATININE  --    < > 1.06 1.13 1.57* 1.08 1.05  CALCIUM   --    < > 9.7 9.8 9.8 9.5 9.5  MG 1.9  --   --   --   --  3.3*  --   PHOS  --   --   --   --   --  3.1  --    < > = values in this interval not displayed.   GFR Estimated Creatinine Clearance: 48.8 mL/min (by C-G formula based on SCr of 1.05 mg/dL). Liver Function Tests: Recent Labs  Lab 05/03/24 1257 05/05/24 0851 05/06/24 0626 05/07/24 0807 05/09/24 0849  AST 23 51* 38 44*  --   ALT 23 29 26  32  --   ALKPHOS 61 81 76 76  --   BILITOT 0.6 0.6 0.7 0.6  --   PROT 7.2 7.2 7.3 7.1  --   ALBUMIN 4.3 3.9 3.9 3.7 3.0*   No results for input(s): LIPASE, AMYLASE in the last 168 hours. Recent Labs  Lab 05/07/24 1155  AMMONIA 45*   Coagulation profile No results for input(s): INR, PROTIME in the last  168 hours.  CBC: Recent Labs  Lab 05/03/24 1257 05/04/24 0527 05/05/24 1119 05/06/24 0626 05/07/24 0807 05/09/24 0849  WBC 8.8 6.7 13.5* 13.5* 12.6* 10.2  NEUTROABS 6.6  --  8.8*  --  9.7*  --   HGB 12.2* 11.1* 14.8 15.5 14.0 12.8*  HCT 35.9* 33.4* 42.6 44.1 42.9 39.5  MCV 90.2 92.0 88.0 87.8 93.1 92.1  PLT 177 155 281 289 297 234   Cardiac Enzymes: No results for input(s): CKTOTAL, CKMB, CKMBINDEX, TROPONINI in the last 168 hours. BNP (last 3 results) No results for input(s): PROBNP in the last 8760 hours. CBG: Recent Labs  Lab 05/09/24 1803 05/10/24 0014 05/10/24 0557 05/10/24 0745 05/10/24 1204  GLUCAP 248* 290* 243* 175* 405*   D-Dimer: No results for input(s): DDIMER in the last 72 hours. Hgb A1c: No results for input(s): HGBA1C in the last 72 hours. Lipid Profile: No results for input(s): CHOL, HDL, LDLCALC, TRIG, CHOLHDL, LDLDIRECT in the last 72 hours. Thyroid function studies: No results for input(s): TSH, T4TOTAL, T3FREE, THYROIDAB in the last 72 hours.  Invalid input(s): FREET3 Anemia work up: No results for input(s): VITAMINB12, FOLATE, FERRITIN, TIBC, IRON, RETICCTPCT in the last 72 hours. Sepsis Labs: Recent Labs  Lab 05/05/24 1119 05/06/24 0626 05/07/24 0807 05/09/24 0849  WBC 13.5* 13.5* 12.6* 10.2    Microbiology Recent Results (from the past 240 hours)  Resp panel by RT-PCR (RSV, Flu A&B, Covid) Anterior Nasal Swab     Status: None   Collection Time: 05/06/24  3:26 PM   Specimen: Anterior Nasal Swab  Result Value Ref Range Status   SARS Coronavirus 2 by RT PCR NEGATIVE NEGATIVE Final    Comment: (NOTE) SARS-CoV-2 target nucleic acids are NOT DETECTED.  The SARS-CoV-2  RNA is generally detectable in upper respiratory specimens during the acute phase of infection. The lowest concentration of SARS-CoV-2 viral copies this assay can detect is 138 copies/mL. A negative result does not  preclude SARS-Cov-2 infection and should not be used as the sole basis for treatment or other patient management decisions. A negative result may occur with  improper specimen collection/handling, submission of specimen other than nasopharyngeal swab, presence of viral mutation(s) within the areas targeted by this assay, and inadequate number of viral copies(<138 copies/mL). A negative result must be combined with clinical observations, patient history, and epidemiological information. The expected result is Negative.  Fact Sheet for Patients:  bloggercourse.com  Fact Sheet for Healthcare Providers:  seriousbroker.it  This test is no t yet approved or cleared by the [place]  FDA and  has been authorized for detection and/or diagnosis of SARS-CoV-2 by FDA under an Emergency Use Authorization (EUA). This EUA will remain  in effect (meaning this test can be used) for the duration of the COVID-19 declaration under Section 564(b)(1) of the Act, 21 U.S.C.section 360bbb-3(b)(1), unless the authorization is terminated  or revoked sooner.       Influenza A by PCR NEGATIVE NEGATIVE Final   Influenza B by PCR NEGATIVE NEGATIVE Final    Comment: (NOTE) The Xpert Xpress SARS-CoV-2/FLU/RSV plus assay is intended as an aid in the diagnosis of influenza from Nasopharyngeal swab specimens and should not be used as a sole basis for treatment. Nasal washings and aspirates are unacceptable for Xpert Xpress SARS-CoV-2/FLU/RSV testing.  Fact Sheet for Patients: bloggercourse.com  Fact Sheet for Healthcare Providers: seriousbroker.it  This test is not yet approved or cleared by the [place]  FDA and has been authorized for detection and/or diagnosis of SARS-CoV-2 by FDA under an Emergency Use Authorization (EUA). This EUA will remain in effect (meaning this test can be used) for the  duration of the COVID-19 declaration under Section 564(b)(1) of the Act, 21 U.S.C. section 360bbb-3(b)(1), unless the authorization is terminated or revoked.     Resp Syncytial Virus by PCR NEGATIVE NEGATIVE Final    Comment: (NOTE) Fact Sheet for Patients: bloggercourse.com  Fact Sheet for Healthcare Providers: seriousbroker.it  This test is not yet approved or cleared by the [place]  FDA and has been authorized for detection and/or diagnosis of SARS-CoV-2 by FDA under an Emergency Use Authorization (EUA). This EUA will remain in effect (meaning this test can be used) for the duration of the COVID-19 declaration under Section 564(b)(1) of the Act, 21 U.S.C. section 360bbb-3(b)(1), unless the authorization is terminated or revoked.  Performed at [facility], [address] [place], [place] [address]   Respiratory (~20 pathogens) panel by PCR     Status: None   Collection Time: 05/06/24  3:26 PM   Specimen: Nasopharyngeal Swab; Respiratory  Result Value Ref Range Status   Adenovirus NOT DETECTED NOT DETECTED Final   Coronavirus 229E NOT DETECTED NOT DETECTED Final    Comment: (NOTE) The Coronavirus on the Respiratory Panel, DOES NOT test for the novel  Coronavirus (2019 nCoV)    Coronavirus HKU1 NOT DETECTED NOT DETECTED Final   Coronavirus NL63 NOT DETECTED NOT DETECTED Final   Coronavirus OC43 NOT DETECTED NOT DETECTED Final   Metapneumovirus NOT DETECTED NOT DETECTED Final   Rhinovirus / Enterovirus NOT DETECTED NOT DETECTED Final   Influenza A NOT DETECTED NOT DETECTED Final   Influenza B NOT DETECTED NOT DETECTED Final   Parainfluenza Virus 1 NOT DETECTED NOT DETECTED  Final   Parainfluenza Virus 2 NOT DETECTED NOT DETECTED Final   Parainfluenza Virus 3 NOT DETECTED NOT DETECTED Final   Parainfluenza Virus 4 NOT DETECTED NOT DETECTED Final   Respiratory Syncytial Virus NOT DETECTED NOT DETECTED Final    Bordetella pertussis NOT DETECTED NOT DETECTED Final   Bordetella Parapertussis NOT DETECTED NOT DETECTED Final   Chlamydophila pneumoniae NOT DETECTED NOT DETECTED Final   Mycoplasma pneumoniae NOT DETECTED NOT DETECTED Final    Comment: Performed at [facility], [address] [place], [place] [address]    Procedures and diagnostic studies:  ECHOCARDIOGRAM COMPLETE Result Date: 05/09/2024    ECHOCARDIOGRAM REPORT   Patient Name:   RISHAB STOUDT Date of Exam: 05/08/2024 Medical Rec #:  [id number]        Height:       65.0 in Accession #:    [id number]       Weight:       151.9 lb Date of Birth:  1946-02-20        BSA:          1.760 m Patient Age:    78 years         BP:           123/59 mmHg Patient Gender: M                HR:           94 bpm. Exam Location:  [facility] Procedure: 2D Echo, Cardiac Doppler and Color Doppler (Both Spectral and Color            Flow Doppler were utilized during procedure). Indications:     I48.91 Atrial Fibrillation  History:         Patient has prior history of Echocardiogram examinations, most                  recent 10/09/2014. Risk Factors:Diabetes and Hypertension.  Sonographer:     Carl Coma RDCS Referring Phys:  [id number] ALEXANDRA DEZII Diagnosing Phys: Marsa Dooms MD IMPRESSIONS  1. Left ventricular ejection fraction, by estimation, is 50 to 55%. The left ventricle has low normal function. The left ventricle has no regional wall motion abnormalities. Left ventricular diastolic parameters are consistent with Grade I diastolic dysfunction (impaired relaxation).  2. Right ventricular systolic function is normal. The right ventricular size is normal.  3. The mitral valve is normal in structure. Mild mitral valve regurgitation. No evidence of mitral stenosis.  4. The aortic valve is normal in structure. Aortic valve regurgitation is not visualized. No aortic stenosis is present.  5. The inferior vena cava is normal in size with greater than 50%  respiratory variability, suggesting right atrial pressure of 3 mmHg. FINDINGS  Left Ventricle: Left ventricular ejection fraction, by estimation, is 50 to 55%. The left ventricle has low normal function. The left ventricle has no regional wall motion abnormalities. Strain was performed and the global longitudinal strain is indeterminate. The left ventricular internal cavity size was normal in size. There is no left ventricular hypertrophy. Left ventricular diastolic parameters are consistent with Grade I diastolic dysfunction (impaired relaxation). Right Ventricle: The right ventricular size is normal. No increase in right ventricular wall thickness. Right ventricular systolic function is normal. Left Atrium: Left atrial size was normal in size. Right Atrium: Right atrial size was normal in size. Pericardium: There is no evidence of pericardial effusion. Mitral Valve: The mitral valve is normal in structure. Mild mitral valve regurgitation.  No evidence of mitral valve stenosis. Tricuspid Valve: The tricuspid valve is normal in structure. Tricuspid valve regurgitation is mild . No evidence of tricuspid stenosis. Aortic Valve: The aortic valve is normal in structure. Aortic valve regurgitation is not visualized. No aortic stenosis is present. Pulmonic Valve: The pulmonic valve was normal in structure. Pulmonic valve regurgitation is not visualized. No evidence of pulmonic stenosis. Aorta: The aortic root is normal in size and structure. Venous: The inferior vena cava is normal in size with greater than 50% respiratory variability, suggesting right atrial pressure of 3 mmHg. IAS/Shunts: No atrial level shunt detected by color flow Doppler. Additional Comments: 3D was performed not requiring image post processing on an independent workstation and was indeterminate.  LEFT VENTRICLE PLAX 2D LVIDd:         3.30 cm   Diastology LVIDs:         2.40 cm   LV e' medial:    5.38 cm/s LV PW:         1.20 cm   LV E/e' medial:  8.9  LV IVS:        1.40 cm   LV e' lateral:   9.24 cm/s LVOT diam:     2.10 cm   LV E/e' lateral: 5.2 LV SV:         43 LV SV Index:   24 LVOT Area:     3.46 cm  RIGHT VENTRICLE             IVC RV Basal diam:  3.00 cm     IVC diam: 1.40 cm RV S prime:     10.63 cm/s TAPSE (M-mode): 1.5 cm LEFT ATRIUM             Index        RIGHT ATRIUM          Index LA diam:        4.00 cm 2.27 cm/m   RA Area:     7.66 cm LA Vol (A2C):   25.1 ml 14.26 ml/m  RA Volume:   15.30 ml 8.69 ml/m LA Vol (A4C):   33.8 ml 19.21 ml/m LA Biplane Vol: 30.0 ml 17.05 ml/m  AORTIC VALVE LVOT Vmax:   85.13 cm/s LVOT Vmean:  56.167 cm/s LVOT VTI:    0.124 m  AORTA Ao Root diam: 4.00 cm Ao Asc diam:  3.20 cm MITRAL VALVE MV Area (PHT): 4.14 cm    SHUNTS MV Decel Time: 183 msec    Systemic VTI:  0.12 m MV E velocity: 47.97 cm/s  Systemic Diam: 2.10 cm MV A velocity: 99.43 cm/s MV E/A ratio:  0.48 Marsa Dooms MD Electronically signed by Marsa Dooms MD Signature Date/Time: 05/09/2024/7:38:10 AM    Final    MR BRAIN WO CONTRAST Result Date: 05/09/2024 EXAM: MRI BRAIN WITHOUT CONTRAST 05/08/2024 10:59:04 PM TECHNIQUE: Multiplanar multisequence MRI of the head/brain was performed without the administration of intravenous contrast. COMPARISON: MR Head without contrast 08/17/2023. CT head 05/05/2024 and earlier. CLINICAL HISTORY: 78 year old male with history of intracranial hemorrhage and trauma. FINDINGS: BRAIN AND VENTRICLES: Asymmetric subdural hematomas, along the left convexity measuring 6 to 7 mm in thickness (series 12 image 37 and series 11 image 16) and along the right convexity measuring 2 mm in thickness. Mild rightward midline shift of roughly 3 mm. Superimposed anterior bifrontal hemorrhagic contusions, right greater than left. Smaller acute hemorrhagic contusions in the anterior left temporal lobe (series 12 image 15 and series 14 image  22). Small volume of intraventricular hemorrhage most apparent in the occipital  horns (series 14 image 34). Scattered bilateral subarachnoid hemorrhage (series 14 image 62, series 12 image 41). Furthermore, small volume bilateral more symmetric posterior fossa subdural blood on series 12 image 11, 2 mm in thickness. Bifrontal cerebral edema. No significant mass effect on the frontal horns. Underlying chronic encephalomalacia in the anterior right frontal lobe. No ventriculomegaly, stable ventricle size and configuration. Effaced suprasellar cistern. Other basilar cisterns are stable from earlier this year. Blood related susceptibility artifact on DWI. No areas of diffusion restriction strongly suggestive of superimposed acute infarct. No mass. Normal flow voids. ORBITS: No acute abnormality. SINUSES AND MASTOIDS: Mild paranasal sinus and mastoid opacification has not significantly changed from the MRI earlier this year. BONES AND SOFT TISSUES: Normal marrow signal. Stable visible cervical spine degeneration including ligamentous hypertrophy about the odontoid. Small volume retained secretions in the pharynx. IMPRESSION: 1. Multifocal posttraumatic intracranial hemorrhage not significantly changed from 05/05/2024 CT head: Subdural hematomas - left convexity (6-7 mm), right convexity (2 mm), and bilateral posterior fossa measuring 2 mm. Right > left anterior frontal lobe and left anterior temporal lobe Hemorrhagic contusions. Scattered bilateral Subarachnoid hemorrhages. Small volume intraventricular hemorrhagic hemorrhage. 2. Effaced suprasellar cistern and mild rightward midline shift (3 mm). No other significant intracranial mass effect. Basilar cisterns are patent. 3. No ventriculomegaly. No superimposed acute infarct. No other complicating features. Electronically signed by: Helayne Hurst MD 05/09/2024 04:02 AM EST RP Workstation: [id number]               LOS: 7 days   Tamyia Minich  [facility]   Pager on [email or web address]. If 7PM-7AM, please contact night-coverage at  [email or web address]     05/10/2024, 12:15 PM

## 2024-05-10 NOTE — Progress Notes (Signed)
 Neurosurgery Progress Note  History: Dustin Ferrell is here for traumatic brain injury  HD8: No new events overnight. HD7: Some slight improvements, was able to eat some. HD4: Has continued agitation.   HD3: Agitated yesterday and overnight.  HD2: No issues overnight.  He still has a headache.  Otherwise he has no complaints  Physical Exam: Vitals:   05/10/24 0552 05/10/24 0741  BP: 132/73 127/76  Pulse: 96 98  Resp: (!) 22 16  Temp: 97.8 F (36.6 C) (!) 96.7 F (35.9 C)  SpO2: (!) 86% 99%   OE spontaneously, regards, does not follow commands CNI  Strength:Moves all extremities purposefully to stimulation  Data:  Other tests/results:  Sodium is increasing, appears dehydrated  Assessment/Plan:  Dustin Ferrell is here with traumatic brain injury with bifrontal contusions, subdural hematoma, and subarachnoid hemorrhage.  - mobilize - pain control - DVT prophylaxis  - PTOT - May need NGT - discussed with wife and daughters. They are currently opposed.  MRI shows bifrontal injuries.  He may recover, but may be an extended recovery. He may not return to prior performance status.   [name] MD, [facility] Department of Neurosurgery

## 2024-05-10 NOTE — Consult Note (Incomplete)
 NAME:  Dustin Ferrell, MRN:  [id number], DOB:  31-Mar-1946, LOS: 8 ADMISSION DATE:  05/03/2024, CONSULTATION DATE:  05/10/2024 REFERRING MD:  Dr Jens, CHIEF COMPLAINT:  Hypernatremia, AMS   Brief Pt Description / Synopsis:  78 year old male with multifocal traumatic intracranial hemorrhage complicated by severe hypernatremia, hyperglycemia, and metabolic encephalopathy  Brief HPI:  The patient is an 78 year old male with a PMHx of HTN, HLD, T2DM, prior TBI with residual cognitive impairment and superimposed dementia, who presented on 05/03/2024 after family noted facial bruising following episodes of vomiting and upper respiratory symptoms. The patient denied trauma but has poor memory at baseline per family.  Initial laboratory evaluation demonstrated no leukocytosis, mild anemia at baseline, mild BUN elevation, and moderate hyperglycemia. Troponin was mildly elevated but down trended on repeat testing. CT imaging of the head revealed multifocal traumatic intracranial hemorrhage, including bilateral frontal hemorrhagic contusions with associated subarachnoid hemorrhage, intraventricular hemorrhage, and hemorrhage involving the olfactory groove. CT maxillofacial imaging demonstrated an acute right parietal skull fracture.   Neurosurgery was consulted in the ED and recommended non-operative management due to high surgical risk. The patient was subsequently admitted to the hospital medicine service for close neurologic monitoring  SEE SIGNIFICANT HOSPITAL EVENTS BELOW  Past Medical History  Hypertension Type 2 diabetes mellitus Hyperlipidemia Seizure disorder on Depakote  Dementia with behavioral disturbance History of traumatic brain injury Anxiety Basal cell carcinoma (left nasal ala, s/p radiation)  Significant Hospital Events   05/03/2024: Admitted to [facility] with multifocal traumatic ICH and acute right parietal skull fracture. Neurosurgery consulted and recommended non-operative  management.  05/05/2024: Worsening mentation observed; repeat CT head showed mild progression of hemorrhage. Subcutaneous heparin  discontinued  05/07/2024: Mental status improved; patient alert and following commands. EEG negative for seizures. Actively participating in physical therapy. 05/08/2024: Developed new-onset Afib with RVR and agitation. Treated with IV lorazepam  2 mg for sedation and IV metoprolol  5 mg for rate control; heart rate controlled into the low 100s. 05/10/2024: Severe hypernatremia (Na 159-161 mEq/L) and persistent hyperglycemia noted. Initiated IV D5W for sodium correction and IV insulin  drip for glucose control. Neurosurgery recommended q2h sodium monitoring and ICU-level care. Nephrology consulted. ICU consulted for close monitoring, electrolyte management, and insulin  infusion.  Consults:  Neurosurgery Nephrology PCCM  Procedures:  None  Interim History / Subjective:    -see significant events above  Micro Data:  None Antimicrobials:  none  OBJECTIVE  Blood pressure 92/67, pulse 86, temperature 98.4 F (36.9 C), temperature source Axillary, resp. rate (!) 26, height 5' 5 (1.651 m), weight 67.6 kg, SpO2 93%.        Intake/Output Summary (Last 24 hours) at 05/11/2024 0029 Last data filed at 05/10/2024 1900 Gross per 24 hour  Intake 120 ml  Output 400 ml  Net -280 ml   Filed Weights   05/08/24 1900 05/09/24 0540 05/10/24 0500  Weight: 68.9 kg 69.3 kg 67.6 kg   Physical Examination  GENERAL: Critically ill elderly male, awake, no acute distress, intermittent agitation HEENT: [place]/AT, PERRL, sclera anicteric CARDIAC: Regular rate and rhythm, no murmurs PULMONARY: CTAB with mild crackles, no wheezes ABDOMEN: Soft, NT/ND, bowel sounds present EXTREMITIES: No edema NEUROLOGIC: unable to follow commands. no focal deficits  Labs/imaging that I havepersonally reviewed  (right click and Reselect all SmartList Selections daily)   No results found.    Labs   CBC: Recent Labs  Lab 05/04/24 0527 05/05/24 1119 05/06/24 0626 05/07/24 0807 05/09/24 0849  WBC 6.7 13.5* 13.5* 12.6* 10.2  NEUTROABS  --  8.8*  --  9.7*  --   HGB 11.1* 14.8 15.5 14.0 12.8*  HCT 33.4* 42.6 44.1 42.9 39.5  MCV 92.0 88.0 87.8 93.1 92.1  PLT 155 281 289 297 234   Basic Metabolic Panel: Recent Labs  Lab 05/05/24 0851 05/06/24 0626 05/07/24 0807 05/09/24 0849 05/10/24 0909 05/10/24 1756 05/10/24 2232  NA 136 141 146* 152* 158* 159* 161*  K 4.3 4.5 3.9 3.5 3.9  --   --   CL 99 104 106 117* 122*  --   --   CO2 20* 22 23 21* 19*  --   --   GLUCOSE 204* 243* 192* 247* 223*  --   --   BUN 33* 41* 65* 62* 63*  --   --   CREATININE 1.06 1.13 1.57* 1.08 1.05  --   --   CALCIUM  9.7 9.8 9.8 9.5 9.5  --   --   MG  --   --   --  3.3*  --   --   --   PHOS  --   --   --  3.1  --   --   --    GFR: Estimated Creatinine Clearance: 48.8 mL/min (by C-G formula based on SCr of 1.05 mg/dL). Recent Labs  Lab 05/05/24 1119 05/06/24 0626 05/07/24 0807 05/09/24 0849  WBC 13.5* 13.5* 12.6* 10.2   Liver Function Tests: Recent Labs  Lab 05/05/24 0851 05/06/24 0626 05/07/24 0807 05/09/24 0849  AST 51* 38 44*  --   ALT 29 26 32  --   ALKPHOS 81 76 76  --   BILITOT 0.6 0.7 0.6  --   PROT 7.2 7.3 7.1  --   ALBUMIN 3.9 3.9 3.7 3.0*   No results for input(s): LIPASE, AMYLASE in the last 168 hours. Recent Labs  Lab 05/07/24 1155  AMMONIA 45*   ABG No results found for: PHART, PCO2ART, PO2ART, HCO3, TCO2, ACIDBASEDEF, O2SAT   Coagulation Profile: No results for input(s): INR, PROTIME in the last 168 hours.  Cardiac Enzymes: No results for input(s): CKTOTAL, CKMB, CKMBINDEX, TROPONINI in the last 168 hours.  HbA1C: Hemoglobin A1C  Date/Time Value Ref Range Status  07/09/2015 12:00 AM 8.4  Final   Hgb A1c MFr Bld  Date/Time Value Ref Range Status  05/06/2024 06:26 AM 8.4 (H) 4.8 - 5.6 % Final    Comment:    (NOTE)          Prediabetes: 5.7 - 6.4         Diabetes: >6.4         Glycemic control for adults with diabetes: <7.0   07/11/2019 11:43 AM 7.4 (H) 4.8 - 5.6 % Final    Comment:             Prediabetes: 5.7 - 6.4          Diabetes: >6.4          Glycemic control for adults with diabetes: <7.0    CBG: Recent Labs  Lab 05/10/24 1955 05/10/24 2104 05/10/24 2158 05/10/24 2313 05/10/24 2356  GLUCAP 358* 321* 268* 148* 112*   Review of Systems:   Unable to be obtained secondary to the patient's altered status.   Past Medical History  He,  has a past medical history of Anxiety, Basal cell carcinoma (06/03/2021), Dementia with behavioral disturbance (HCC), Diabetes mellitus without complication (HCC), History of traumatic head injury, and Hypertension.   Surgical History    Past Surgical History:  Procedure Laterality Date   Eye Socket fracture repair Right    HERNIA REPAIR     ROTATOR CUFF REPAIR Left    TONSILLECTOMY     WRIST FRACTURE SURGERY Right      Social History   reports that he has been smoking cigarettes. He has a 60 pack-year smoking history. He has never used smokeless tobacco. He reports current alcohol  use of about 1.0 standard drink of alcohol  per week. He reports that he does not use drugs.   Family History   His family history includes Diabetes in his father; Heart attack in his father; Stomach cancer in his mother. There is no history of Colon cancer.   Allergies Allergies[1]   Home Medications  Prior to Admission medications  Medication Sig Start Date End Date Taking? Authorizing Provider  amLODipine  (NORVASC ) 5 MG tablet Take 1 tablet (5 mg total) by mouth daily. (if BP not at goal) 08/13/19 05/03/24 Yes Alvan Dorothyann BIRCH, MD  aspirin EC 81 MG tablet Take 81 mg by mouth daily.   Yes [provider]  atorvastatin  (LIPITOR) 10 MG tablet TAKE 1 TABLET BY MOUTH EVERY DAY 07/24/19  Yes Alvan Dorothyann BIRCH, MD  busPIRone  (BUSPAR ) 5 MG tablet Take 5 mg by  mouth 2 (two) times daily. 03/11/24  Yes [provider]  colestipol (COLESTID) 1 g tablet Take 1 g by mouth daily. 02/25/24 02/24/25 Yes [provider]  divalproex  (DEPAKOTE ) 500 MG DR tablet Take 500 mg by mouth 3 (three) times daily. 04/11/24  Yes [provider]  ferrous sulfate  325 (65 FE) MG EC tablet Take 1 tablet (325 mg total) by mouth 2 (two) times daily with a meal. 07/16/19  Yes Maruyama, Deborah, DO  insulin  degludec (TRESIBA  FLEXTOUCH) 100 UNIT/ML SOPN FlexTouch Pen Inject 0.1 mLs (10 Units total) into the skin daily. Patient taking differently: Inject 23 Units into the skin daily. 06/20/19  Yes Danford, Katy D, NP  lisinopril  (ZESTRIL ) 20 MG tablet Take 20 mg by mouth daily.   Yes [provider]  omeprazole  (PRILOSEC) 20 MG capsule TAKE 1 CAPSULE BY MOUTH EVERY EVENING 30 MINUTES PRIOR TO DINNER 02/11/20  Yes Nandigam, Kavitha V, MD  sertraline  (ZOLOFT ) 100 MG tablet Take 200 mg daily by mouth. 07/23/15  Yes [provider]  sitaGLIPtin  (JANUVIA ) 100 MG tablet TAKE 1 TABLET (100 MG TOTAL) DAILY BY MOUTH. 08/13/19  Yes Alvan Dorothyann BIRCH, MD  glucose blood test strip Use to check blood sugars every morning fasting and 2 hours after largest meal 05/27/19   Danford, Katy D, NP  Lancets (ACCU-CHEK SOFT TOUCH) lancets Use to check blood sugars every morning fasting and 2 hours after largest meal 05/27/19   Danford, Katy D, NP  Scheduled Meds:  amLODipine   10 mg Oral Daily   busPIRone   5 mg Oral BID   Chlorhexidine  Gluconate Cloth  6 each Topical Daily   divalproex   500 mg Oral TID   lactulose   20 g Oral TID   lisinopril   30 mg Oral Daily   pantoprazole   40 mg Oral QHS   QUEtiapine   25 mg Oral QHS   sertraline   200 mg Oral Daily   sodium chloride  flush  10-40 mL Intracatheter Q12H   tamsulosin   0.8 mg Oral QPC breakfast   Continuous Infusions:  dextrose  75 mL/hr at 05/11/24 0151   insulin  Stopped (05/11/24 0111)   PRN Meds:.acetaminophen   **OR** acetaminophen  (TYLENOL ) oral liquid 160 mg/5 mL **OR** acetaminophen , haloperidol  lactate, hydrALAZINE ,  labetalol , senna-docusate, sodium chloride  flush   Active Hospital Problem list   See systems below  Assessment & Plan:  #Hypernatremia -Strict I&O  -Follow BMP with serial Na q2h -Ensure adequate renal perfusion -Avoid nephrotoxic agents when possible -Replace electrolytes as indicated (Pharmacy assisting with electrolyte management) -Initiate D5W at 50 mL/hr -Target sodium correction: 6-8 mEq/L per 24 hours -Obtain urine osmolality and urine sodium -Nephrology consulted  #Acute Metabolic Encephalopathy Multifactorial (TBI, hypernatremia, hyperglycemia) -Delirium precautions -Treat underlying metabolic derangements as above -Supportive care -Avoid sedating medications when possible  #Traumatic Intracranial Hemorrhage (SDH, SAH) PMHx: Anxiety, TBI, seizure disorder, dementia -Conservative management per Neurosurgery -Seizure p[recautions -EEG neg for seizures -Continue Depakote  -Neurosurgery following; appreciate recommendations -Maintain low threshold for repeat CT head with any neurologic change  #Diabetes Mellitus Type II  HbA1c 8.4%,  Hyperglycemia 148-358 mg/dL Assessment: Poor glycemic control, stress hyperglycemia, currently on insulin  drip with sliding scale. -ICU glucose monitoring q1-2h while on insulin  infusion; q4h if stable. -Target glucose 140-180 mg/dL. -Adjust insulin  infusion per ICU protocol. -Restart basal insulin  once transition to stable subcutaneous regimen is safe. -Hold oral hypoglycemics while critically ill.  #New-Onset Atrial Fibrillation with RVR Acute AF likely secondary to illness, hypernatremia, and stress response. -Currently Rate control with PRN betablocker -Avoid anticoagulation until neurosurgery clears. -Monitor for recurrent AF and hemodynamic stability. -check TSH, Free T4 -Daily ECG and cardiac enzymes as  indicated.  #Hypertension (HTN) #Hyperlipidemia (HLD) currently borderline hypotensive (BP 92/67). -Hold or adjust antihypertensive medications (amlodipine , lisinopril ) while patient is hypotensive. -Target BP: avoid hypotension; maintain MAP >=65 mmHg to ensure cerebral perfusion. -Reassess antihypertensive regimen once stabilized. -Continue atorvastatin  if oral intake tolerated; otherwise, hold temporarily.    Best practice:  Diet:  Oral Pain/Anxiety/Delirium protocol (if indicated): No VAP protocol (if indicated): Not indicated DVT prophylaxis: Contraindicated GI prophylaxis: N/A Glucose control:  Insulin  gtt Central venous access:  N/A Arterial line:  N/A Foley:  Yes, and it is still needed Mobility:  bed rest  PT/OT consulted: N/A Code Status:  limited Disposition: ICU   = Goals of Care =  Primary Emergency ContactJhalen, Eley, Home Phone: [phone]   Critical care time: 55 minutes        Almarie Nose DNP, CCRN, FNP-C, AGACNP-BC Acute Care & Family Nurse Practitioner [facility] PCCM on call pager [phone]         [1]  Allergies Allergen Reactions   Metformin  Diarrhea

## 2024-05-11 DIAGNOSIS — E870 Hyperosmolality and hypernatremia: Secondary | ICD-10-CM

## 2024-05-11 DIAGNOSIS — S020XXA Fracture of vault of skull, initial encounter for closed fracture: Secondary | ICD-10-CM

## 2024-05-11 DIAGNOSIS — G9341 Metabolic encephalopathy: Secondary | ICD-10-CM

## 2024-05-11 DIAGNOSIS — G9340 Encephalopathy, unspecified: Secondary | ICD-10-CM

## 2024-05-11 LAB — CBC
HCT: 42.8 % (ref 39.0–52.0)
Hemoglobin: 13.5 g/dL (ref 13.0–17.0)
MCH: 29.9 pg (ref 26.0–34.0)
MCHC: 31.5 g/dL (ref 30.0–36.0)
MCV: 94.9 fL (ref 80.0–100.0)
Platelets: 267 K/uL (ref 150–400)
RBC: 4.51 MIL/uL (ref 4.22–5.81)
RDW: 14.8 % (ref 11.5–15.5)
WBC: 13.5 K/uL — ABNORMAL HIGH (ref 4.0–10.5)
nRBC: 0 % (ref 0.0–0.2)

## 2024-05-11 LAB — BASIC METABOLIC PANEL WITH GFR
Anion gap: 13 (ref 5–15)
Anion gap: 24 — ABNORMAL HIGH (ref 5–15)
BUN: 77 mg/dL — ABNORMAL HIGH (ref 8–23)
BUN: 90 mg/dL — ABNORMAL HIGH (ref 8–23)
CO2: 25 mmol/L (ref 22–32)
CO2: 16 mmol/L — ABNORMAL LOW (ref 22–32)
Calcium: 9.9 mg/dL (ref 8.9–10.3)
Calcium: 8.8 mg/dL — ABNORMAL LOW (ref 8.9–10.3)
Chloride: 124 mmol/L — ABNORMAL HIGH (ref 98–111)
Chloride: 116 mmol/L — ABNORMAL HIGH (ref 98–111)
Creatinine, Ser: 1.74 mg/dL — ABNORMAL HIGH (ref 0.61–1.24)
Creatinine, Ser: 1.87 mg/dL — ABNORMAL HIGH (ref 0.61–1.24)
GFR, Estimated: 39 mL/min — ABNORMAL LOW (ref >60)
GFR, Estimated: 36 mL/min — ABNORMAL LOW (ref >60)
Glucose, Bld: 89 mg/dL (ref 70–99)
Glucose, Bld: 411 mg/dL — ABNORMAL HIGH (ref 70–99)
Potassium: 3.5 mmol/L (ref 3.5–5.1)
Potassium: 3.2 mmol/L — ABNORMAL LOW (ref 3.5–5.1)
Sodium: 163 mmol/L (ref 135–145)
Sodium: 156 mmol/L — ABNORMAL HIGH (ref 135–145)

## 2024-05-11 LAB — URINALYSIS, COMPLETE (UACMP) WITH MICROSCOPIC
Bilirubin Urine: NEGATIVE
Glucose, UA: >=500 mg/dL — AB
Hgb urine dipstick: NEGATIVE
Ketones, ur: NEGATIVE mg/dL
Leukocytes,Ua: NEGATIVE
Nitrite: NEGATIVE
Protein, ur: NEGATIVE mg/dL
Specific Gravity, Urine: 1.02 (ref 1.005–1.030)
Squamous Epithelial / HPF: 0 /HPF (ref 0–5)
pH: 5 (ref 5.0–8.0)

## 2024-05-11 LAB — SODIUM
Sodium: 162 mmol/L (ref 135–145)
Sodium: 161 mmol/L (ref 135–145)
Sodium: 159 mmol/L — ABNORMAL HIGH (ref 135–145)
Sodium: 159 mmol/L — ABNORMAL HIGH (ref 135–145)
Sodium: 158 mmol/L — ABNORMAL HIGH (ref 135–145)
Sodium: 156 mmol/L — ABNORMAL HIGH (ref 135–145)
Sodium: 155 mmol/L — ABNORMAL HIGH (ref 135–145)

## 2024-05-11 LAB — GLUCOSE, CAPILLARY
Glucose-Capillary: 74 mg/dL (ref 70–99)
Glucose-Capillary: 74 mg/dL (ref 70–99)
Glucose-Capillary: 94 mg/dL (ref 70–99)
Glucose-Capillary: 120 mg/dL — ABNORMAL HIGH (ref 70–99)
Glucose-Capillary: 144 mg/dL — ABNORMAL HIGH (ref 70–99)
Glucose-Capillary: 166 mg/dL — ABNORMAL HIGH (ref 70–99)
Glucose-Capillary: 169 mg/dL — ABNORMAL HIGH (ref 70–99)
Glucose-Capillary: 171 mg/dL — ABNORMAL HIGH (ref 70–99)
Glucose-Capillary: 160 mg/dL — ABNORMAL HIGH (ref 70–99)
Glucose-Capillary: 222 mg/dL — ABNORMAL HIGH (ref 70–99)
Glucose-Capillary: 403 mg/dL — ABNORMAL HIGH (ref 70–99)
Glucose-Capillary: 304 mg/dL — ABNORMAL HIGH (ref 70–99)

## 2024-05-11 LAB — T4, FREE: Free T4: 0.89 ng/dL (ref 0.61–1.12)

## 2024-05-11 LAB — SODIUM, URINE, RANDOM: Sodium, Ur: <30 mmol/L

## 2024-05-11 LAB — TSH: TSH: 0.334 u[IU]/mL — ABNORMAL LOW (ref 0.350–4.500)

## 2024-05-11 LAB — OSMOLALITY, URINE: Osmolality, Ur: 684 mosm/kg (ref 300–900)

## 2024-05-11 MED ORDER — LORAZEPAM 0.5 MG PO TABS
0.2500 mg | ORAL_TABLET | Freq: Once | ORAL | Status: AC
  Administered 2024-05-11: 0.25 mg via ORAL
  Filled 2024-05-11: qty 1

## 2024-05-11 MED ORDER — GERHARDT'S BUTT CREAM
TOPICAL_CREAM | Freq: Two times a day (BID) | CUTANEOUS | Status: DC
  Administered 2024-05-13: 11:00:00 1 via TOPICAL
  Filled 2024-05-11: qty 60

## 2024-05-11 MED ORDER — INSULIN ASPART 100 UNIT/ML IJ SOLN
0.0000 [IU] | INTRAMUSCULAR | Status: DC
  Administered 2024-05-11: 2 [IU] via SUBCUTANEOUS
  Administered 2024-05-11: 20 [IU] via SUBCUTANEOUS
  Administered 2024-05-11: 16 [IU] via SUBCUTANEOUS
  Administered 2024-05-11: 8 [IU] via SUBCUTANEOUS
  Administered 2024-05-12: 2 [IU] via SUBCUTANEOUS
  Administered 2024-05-12: 12 [IU] via SUBCUTANEOUS
  Administered 2024-05-12: 15 [IU] via SUBCUTANEOUS
  Administered 2024-05-12: 8 [IU] via SUBCUTANEOUS
  Administered 2024-05-12: 16 [IU] via SUBCUTANEOUS
  Administered 2024-05-12: 2 [IU] via SUBCUTANEOUS
  Administered 2024-05-13: 15 [IU] via SUBCUTANEOUS
  Filled 2024-05-11: qty 20
  Filled 2024-05-11: qty 2
  Filled 2024-05-11 (×3): qty 4
  Filled 2024-05-11: qty 15
  Filled 2024-05-11: qty 8
  Filled 2024-05-11: qty 3
  Filled 2024-05-11 (×2): qty 16
  Filled 2024-05-11: qty 2

## 2024-05-11 NOTE — Plan of Care (Signed)
  Problem: Clinical Measurements: Goal: Ability to maintain clinical measurements within normal limits will improve Outcome: Progressing Goal: Will remain free from infection Outcome: Progressing Goal: Diagnostic test results will improve Outcome: Progressing   Problem: Coping: Goal: Level of anxiety will decrease Outcome: Progressing   Problem: Safety: Goal: Ability to remain free from injury will improve Outcome: Progressing

## 2024-05-11 NOTE — Consult Note (Signed)
 NAME:  Dustin Ferrell, MRN:  [id number], DOB:  August 30, 1945, LOS: 8 ADMISSION DATE:  05/03/2024, CONSULTATION DATE:  05/10/2024 REFERRING MD:  Dr Jens, CHIEF COMPLAINT:  Hypernatremia, AMS   Brief Pt Description / Synopsis:  78 year old male with multifocal traumatic intracranial hemorrhage complicated by severe hypernatremia, hyperglycemia, and metabolic encephalopathy  Brief HPI:  The patient is an 78 year old male with a PMHx of HTN, HLD, T2DM, prior TBI with residual cognitive impairment and superimposed dementia, who presented on 05/03/2024 after family noted facial bruising following episodes of vomiting and upper respiratory symptoms. The patient denied trauma but has poor memory at baseline per family.  Initial laboratory evaluation demonstrated no leukocytosis, mild anemia at baseline, mild BUN elevation, and moderate hyperglycemia. Troponin was mildly elevated but down trended on repeat testing. CT imaging of the head revealed multifocal traumatic intracranial hemorrhage, including bilateral frontal hemorrhagic contusions with associated subarachnoid hemorrhage, intraventricular hemorrhage, and hemorrhage involving the olfactory groove. CT maxillofacial imaging demonstrated an acute right parietal skull fracture.   Neurosurgery was consulted in the ED and recommended non-operative management due to high surgical risk. The patient was subsequently admitted to the hospital medicine service for close neurologic monitoring  SEE SIGNIFICANT HOSPITAL EVENTS BELOW  Past Medical History  Hypertension Type 2 diabetes mellitus Hyperlipidemia Seizure disorder on Depakote  Dementia with behavioral disturbance History of traumatic brain injury Anxiety Basal cell carcinoma (left nasal ala, s/p radiation)  Significant Hospital Events   05/03/2024: Admitted to [facility] with multifocal traumatic ICH and acute right parietal skull fracture. Neurosurgery consulted and recommended non-operative  management.  05/05/2024: Worsening mentation observed; repeat CT head showed mild progression of hemorrhage. Subcutaneous heparin  discontinued  05/07/2024: Mental status improved; patient alert and following commands. EEG negative for seizures. Actively participating in physical therapy. 05/08/2024: Developed new-onset Afib with RVR and agitation. Treated with IV lorazepam  2 mg for sedation and IV metoprolol  5 mg for rate control; heart rate controlled into the low 100s. 05/10/2024: Severe hypernatremia (Na 159-161 mEq/L) and persistent hyperglycemia noted. Initiated IV D5W for sodium correction and IV insulin  drip for glucose control. Neurosurgery recommended q2h sodium monitoring and ICU-level care. Nephrology consulted. ICU consulted for close monitoring, electrolyte management, and insulin  infusion. 05/11/24: Sitter remains at bedside. Titrating D5W infusion for slow correction of hypernatremia. Concluded insulin  gtt, transitioned to lispro SS  Consults:  Neurosurgery Nephrology PCCM  Procedures:  None  Interim History / Subjective:    -see significant events overnight  Micro Data:  None Antimicrobials:  none  OBJECTIVE  Blood pressure (!) 97/52, pulse 82, temperature (!) 96 F (35.6 C), temperature source Axillary, resp. rate (!) 25, height 5' 5 (1.651 m), weight 62.1 kg, SpO2 97%.        Intake/Output Summary (Last 24 hours) at 05/11/2024 9171 Last data filed at 05/11/2024 0710 Gross per 24 hour  Intake 855.87 ml  Output 600 ml  Net 255.87 ml   Filed Weights   05/09/24 0540 05/10/24 0500 05/11/24 0500  Weight: 69.3 kg 67.6 kg 62.1 kg   Physical Examination  GENERAL: Critically ill elderly male, no acute distress HEENT: [place]/AT, PERRL, sclera anicteric CARDIAC: Regular rate and rhythm, no murmurs PULMONARY: CTAB, no wheezes, normal wob on 2L [place] ABDOMEN: Soft, NT/ND, bowel sounds hypoactive EXTREMITIES: No edema, BLE cool to touch NEUROLOGIC: unable to follow  commands. Eyes open, not tracking, not interacting to voice  Labs/imaging that I havepersonally reviewed  (right click and Reselect all SmartList Selections daily)   No results found.  Labs   CBC: Recent Labs  Lab 05/05/24 1119 05/06/24 0626 05/07/24 0807 05/09/24 0849 05/11/24 0346  WBC 13.5* 13.5* 12.6* 10.2 13.5*  NEUTROABS 8.8*  --  9.7*  --   --   HGB 14.8 15.5 14.0 12.8* 13.5  HCT 42.6 44.1 42.9 39.5 42.8  MCV 88.0 87.8 93.1 92.1 94.9  PLT 281 289 297 234 267   Basic Metabolic Panel: Recent Labs  Lab 05/06/24 0626 05/07/24 0807 05/09/24 0849 05/10/24 0909 05/10/24 1756 05/10/24 2232 05/11/24 0034 05/11/24 0154 05/11/24 0346  NA 141 146* 152* 158* 159* 161* 162* 163* 161*  K 4.5 3.9 3.5 3.9  --   --   --  3.5  --   CL 104 106 117* 122*  --   --   --  124*  --   CO2 22 23 21* 19*  --   --   --  25  --   GLUCOSE 243* 192* 247* 223*  --   --   --  89  --   BUN 41* 65* 62* 63*  --   --   --  77*  --   CREATININE 1.13 1.57* 1.08 1.05  --   --   --  1.74*  --   CALCIUM  9.8 9.8 9.5 9.5  --   --   --  9.9  --   MG  --   --  3.3*  --   --   --   --   --   --   PHOS  --   --  3.1  --   --   --   --   --   --    GFR: Estimated Creatinine Clearance: 29.5 mL/min (A) (by C-G formula based on SCr of 1.74 mg/dL (H)). Recent Labs  Lab 05/06/24 0626 05/07/24 0807 05/09/24 0849 05/11/24 0346  WBC 13.5* 12.6* 10.2 13.5*   Liver Function Tests: Recent Labs  Lab 05/05/24 0851 05/06/24 0626 05/07/24 0807 05/09/24 0849  AST 51* 38 44*  --   ALT 29 26 32  --   ALKPHOS 81 76 76  --   BILITOT 0.6 0.7 0.6  --   PROT 7.2 7.3 7.1  --   ALBUMIN 3.9 3.9 3.7 3.0*   No results for input(s): LIPASE, AMYLASE in the last 168 hours. Recent Labs  Lab 05/07/24 1155  AMMONIA 45*   ABG No results found for: PHART, PCO2ART, PO2ART, HCO3, TCO2, ACIDBASEDEF, O2SAT   Coagulation Profile: No results for input(s): INR, PROTIME in the last 168  hours.  Cardiac Enzymes: No results for input(s): CKTOTAL, CKMB, CKMBINDEX, TROPONINI in the last 168 hours.  HbA1C: Hemoglobin A1C  Date/Time Value Ref Range Status  07/09/2015 12:00 AM 8.4  Final   Hgb A1c MFr Bld  Date/Time Value Ref Range Status  05/06/2024 06:26 AM 8.4 (H) 4.8 - 5.6 % Final    Comment:    (NOTE)         Prediabetes: 5.7 - 6.4         Diabetes: >6.4         Glycemic control for adults with diabetes: <7.0   07/11/2019 11:43 AM 7.4 (H) 4.8 - 5.6 % Final    Comment:             Prediabetes: 5.7 - 6.4          Diabetes: >6.4          Glycemic control for adults  with diabetes: <7.0    CBG: Recent Labs  Lab 05/11/24 0358 05/11/24 0504 05/11/24 0609 05/11/24 0702 05/11/24 0757  GLUCAP 120* 144* 166* 169* 171*   Review of Systems:   Unable to be obtained secondary to the patient's altered status.   Past Medical History  He,  has a past medical history of Anxiety, Basal cell carcinoma (06/03/2021), Dementia with behavioral disturbance (HCC), Diabetes mellitus without complication (HCC), History of traumatic head injury, and Hypertension.   Surgical History    Past Surgical History:  Procedure Laterality Date   Eye Socket fracture repair Right    HERNIA REPAIR     ROTATOR CUFF REPAIR Left    TONSILLECTOMY     WRIST FRACTURE SURGERY Right      Social History   reports that he has been smoking cigarettes. He has a 60 pack-year smoking history. He has never used smokeless tobacco. He reports current alcohol  use of about 1.0 standard drink of alcohol  per week. He reports that he does not use drugs.   Family History   His family history includes Diabetes in his father; Heart attack in his father; Stomach cancer in his mother. There is no history of Colon cancer.   Allergies Allergies[1]   Home Medications  Prior to Admission medications  Medication Sig Start Date End Date Taking? Authorizing Provider  amLODipine  (NORVASC ) 5 MG tablet Take  1 tablet (5 mg total) by mouth daily. (if BP not at goal) 08/13/19 05/03/24 Yes Alvan Dorothyann BIRCH, MD  aspirin EC 81 MG tablet Take 81 mg by mouth daily.   Yes [provider]  atorvastatin  (LIPITOR) 10 MG tablet TAKE 1 TABLET BY MOUTH EVERY DAY 07/24/19  Yes Alvan Dorothyann BIRCH, MD  busPIRone  (BUSPAR ) 5 MG tablet Take 5 mg by mouth 2 (two) times daily. 03/11/24  Yes [provider]  colestipol (COLESTID) 1 g tablet Take 1 g by mouth daily. 02/25/24 02/24/25 Yes [provider]  divalproex  (DEPAKOTE ) 500 MG DR tablet Take 500 mg by mouth 3 (three) times daily. 04/11/24  Yes [provider]  ferrous sulfate  325 (65 FE) MG EC tablet Take 1 tablet (325 mg total) by mouth 2 (two) times daily with a meal. 07/16/19  Yes Vanderford, Deborah, DO  insulin  degludec (TRESIBA  FLEXTOUCH) 100 UNIT/ML SOPN FlexTouch Pen Inject 0.1 mLs (10 Units total) into the skin daily. Patient taking differently: Inject 23 Units into the skin daily. 06/20/19  Yes Danford, Katy D, NP  lisinopril  (ZESTRIL ) 20 MG tablet Take 20 mg by mouth daily.   Yes [provider]  omeprazole  (PRILOSEC) 20 MG capsule TAKE 1 CAPSULE BY MOUTH EVERY EVENING 30 MINUTES PRIOR TO DINNER 02/11/20  Yes Nandigam, Kavitha V, MD  sertraline  (ZOLOFT ) 100 MG tablet Take 200 mg daily by mouth. 07/23/15  Yes [provider]  sitaGLIPtin  (JANUVIA ) 100 MG tablet TAKE 1 TABLET (100 MG TOTAL) DAILY BY MOUTH. 08/13/19  Yes Alvan Dorothyann BIRCH, MD  glucose blood test strip Use to check blood sugars every morning fasting and 2 hours after largest meal 05/27/19   Danford, Katy D, NP  Lancets (ACCU-CHEK SOFT TOUCH) lancets Use to check blood sugars every morning fasting and 2 hours after largest meal 05/27/19   Danford, Katy D, NP  Scheduled Meds:  busPIRone   5 mg Oral BID   Chlorhexidine  Gluconate Cloth  6 each Topical Daily   divalproex   500 mg Oral TID   insulin  aspart  0-24 Units Subcutaneous Q4H  pantoprazole    40 mg Oral QHS   QUEtiapine   25 mg Oral QHS   sertraline   200 mg Oral Daily   sodium chloride  flush  10-40 mL Intracatheter Q12H   tamsulosin   0.8 mg Oral QPC breakfast   Continuous Infusions:  dextrose  75 mL/hr at 05/11/24 0710   PRN Meds:.acetaminophen  **OR** acetaminophen  (TYLENOL ) oral liquid 160 mg/5 mL **OR** acetaminophen , haloperidol  lactate, labetalol , senna-docusate, sodium chloride  flush   Active Hospital Problem list   See systems below  Assessment & Plan:  #Hypernatremia -Strict I&O  -Follow BMP with serial Na q2h -Ensure adequate renal perfusion -Avoid nephrotoxic agents when possible -Replace electrolytes as indicated (Pharmacy assisting with electrolyte management) -Initiate D5W, increased to 75 ml/hr -Target sodium correction: 6-8 mEq/L per 24 hours -12/13 urine osmolality 684 and urine sodium pending -Nephrology consulted  #Acute Metabolic Encephalopathy Multifactorial (TBI, hypernatremia, hyperglycemia) -Delirium precautions -Treat underlying metabolic derangements as above -Supportive care -Avoid sedating medications when possible -Zoloft , buspar   #Traumatic Intracranial Hemorrhage (SDH, SAH) PMHx: Anxiety, TBI, seizure disorder, dementia -Conservative management per Neurosurgery -Seizure precautions -EEG neg for seizures -Continue Depakote  -Neurosurgery following; appreciate recommendations -Maintain low threshold for repeat CT head with any neurologic change -Palliative care consult for family and patient support iso acute neurological change  #Diabetes Mellitus Type II  HbA1c 8.4%,  Hyperglycemia 148-358 mg/dL Assessment: Poor glycemic control, stress hyperglycemia, currently on insulin  drip with sliding scale. -ICU glucose monitoring q1-2h while on insulin  infusion; q4h if stable. -Target glucose 140-180 mg/dL. -Transition insulin  infusion to q4h novolog  SS -Hold oral hypoglycemics while critically ill.  #New-Onset Atrial Fibrillation with  RVR Acute AF likely secondary to illness, hypernatremia, and stress response. -Currently Rate control with PRN betablocker -Avoid anticoagulation until neurosurgery clears. -Monitor for recurrent AF and hemodynamic stability. -low TSH, normal Free T4 -Daily ECG and cardiac enzymes as indicated.  #Hypertension (HTN) #Hyperlipidemia (HLD) currently borderline hypotensive (BP 92/67). -Hold or adjust antihypertensive medications (amlodipine , lisinopril ) while patient has marginal BP. -Target BP: avoid hypotension; maintain MAP >=65 mmHg to ensure cerebral perfusion. -Reassess antihypertensive regimen once stabilized. -Continue atorvastatin  if oral intake tolerated; otherwise, hold temporarily.    Best practice:  Diet:  Oral Pain/Anxiety/Delirium protocol (if indicated): No VAP protocol (if indicated): Not indicated DVT prophylaxis: Contraindicated GI prophylaxis: N/A Glucose control:  SSI Yes Central venous access:  N/A Arterial line:  N/A Foley:  Yes, and it is still needed Mobility:  bed rest  PT/OT consulted: Yes Code Status:  limited Disposition: ICU   = Goals of Care =  Primary Emergency ContactCyle, [place], Home Phone: [phone]   Critical care time: 55 minutes     Debbie Katz, ACNP-BC Pulmonary Critical Care, [place] Phone: [phone] PCCM on call pager [phone]          [1]  Allergies Allergen Reactions   Metformin  Diarrhea

## 2024-05-11 NOTE — Consult Note (Signed)
 [facility] CONSULT NOTE    Date: 05/11/2024                  Patient Name:  Dustin Ferrell  MRN: [id number]  DOB: Mar 18, 1946  Age / Sex: 78 y.o., male         PCP: [name], MD                 Service Requesting Consult: Critical care                 Reason for Consult: Hyponatremia in the setting of traumatic brain injury with bifrontal contusions, subdural hematoma, and subarachnoid hemorrhage            History of Present Illness: Patient is a 78 y.o. male with a PMHx of hypertension, hyperlipidemia, diabetes mellitus type 2, dementia, prior history of traumatic brain injury, who was admitted to [facility] on 05/03/2024 for evaluation of traumatic brain injury.  He was brought to the hospital by his wife as facial bruising was noted.  There was also increasing confusion at home.  There were hemorrhagic contusions involving the right greater and left anterior frontal lobes with adjacent sulcal subarachnoid hemorrhage and associated edema.  Patient was seen at bedside in the critical care unit this a.m.  Patient's wife and 2 daughters were also at the bedside.  Patient found to be agitated at times.  We are now consulted for evaluation management of hypernatremia.  It appears that he has had poor liquid intake over the past several days.  His sodium began rising on 05/07/2024.  It has been steadily rising since that time and serum sodium peaked at 163 earlier this a.m. at 154.  He has been started on D5W at 75 cc/h.  Serum sodium down to 159 earlier this a.m.  He also appears to have some element of acute kidney injury with a BUN of 77 and creatinine of 1.74.   Medications: Outpatient medications: Medications Prior to Admission  Medication Sig Dispense Refill Last Dose/Taking   amLODipine  (NORVASC ) 5 MG tablet Take 1 tablet (5 mg total) by mouth daily. (if BP not at goal) 90 tablet 0 Past Week   aspirin EC 81 MG tablet Take 81 mg by mouth daily.   Past Week    atorvastatin  (LIPITOR) 10 MG tablet TAKE 1 TABLET BY MOUTH EVERY DAY 90 tablet 3 Past Week   busPIRone  (BUSPAR ) 5 MG tablet Take 5 mg by mouth 2 (two) times daily.   Past Week   colestipol (COLESTID) 1 g tablet Take 1 g by mouth daily.   Past Week   divalproex  (DEPAKOTE ) 500 MG DR tablet Take 500 mg by mouth 3 (three) times daily.   Past Week   ferrous sulfate  325 (65 FE) MG EC tablet Take 1 tablet (325 mg total) by mouth 2 (two) times daily with a meal. 90 tablet 11 Past Week   insulin  degludec (TRESIBA  FLEXTOUCH) 100 UNIT/ML SOPN FlexTouch Pen Inject 0.1 mLs (10 Units total) into the skin daily. (Patient taking differently: Inject 23 Units into the skin daily.) 15 mL 6 Past Week   lisinopril  (ZESTRIL ) 20 MG tablet Take 20 mg by mouth daily.   Past Week   omeprazole  (PRILOSEC) 20 MG capsule TAKE 1 CAPSULE BY MOUTH EVERY EVENING 30 MINUTES PRIOR TO DINNER 90 capsule 1 Past Week   sertraline  (ZOLOFT ) 100 MG tablet Take 200 mg daily by mouth.   Past Week  sitaGLIPtin  (JANUVIA ) 100 MG tablet TAKE 1 TABLET (100 MG TOTAL) DAILY BY MOUTH. 90 tablet 0 Past Week   glucose blood test strip Use to check blood sugars every morning fasting and 2 hours after largest meal 100 each 12    Lancets (ACCU-CHEK SOFT TOUCH) lancets Use to check blood sugars every morning fasting and 2 hours after largest meal 100 each 12     Current medications: Current Facility-Administered Medications  Medication Dose Route Frequency Provider Last Rate Last Admin   acetaminophen  (TYLENOL ) tablet 650 mg  650 mg Oral Q4H PRN Jens Durand, MD   650 mg at 05/06/24 9187   Or   acetaminophen  (TYLENOL ) 160 MG/5ML solution 650 mg  650 mg Per Tube Q4H PRN Jens Durand, MD       Or   acetaminophen  (TYLENOL ) suppository 650 mg  650 mg Rectal Q4H PRN Jens Durand, MD       busPIRone  (BUSPAR ) tablet 5 mg  5 mg Oral BID Jens Durand, MD   5 mg at 05/11/24 9052   Chlorhexidine  Gluconate Cloth 2 % PADS 6 each  6 each Topical Daily  Keene, Jeremiah D, NP   6 each at 05/11/24 0945   dextrose  5 % solution   Intravenous Continuous Isadora Hose, MD 100 mL/hr at 05/11/24 1224 Infusion Verify at 05/11/24 1224   divalproex  (DEPAKOTE  SPRINKLE) capsule 500 mg  500 mg Oral TID Jens Durand, MD   500 mg at 05/11/24 9055   haloperidol  lactate (HALDOL ) injection 2 mg  2 mg Intramuscular Q6H PRN Jens Durand, MD   2 mg at 05/10/24 1344   insulin  aspart (novoLOG ) injection 0-24 Units  0-24 Units Subcutaneous Q4H Benjamin, Alzora L, NP   2 Units at 05/11/24 0944   labetalol  (NORMODYNE ) injection 10 mg  10 mg Intravenous Q2H PRN Jens Durand, MD       pantoprazole  (PROTONIX ) EC tablet 40 mg  40 mg Oral QHS Jens Durand, MD   40 mg at 05/10/24 2211   QUEtiapine  (SEROQUEL ) tablet 25 mg  25 mg Oral QHS Jens Durand, MD       senna-docusate (Senokot-S) tablet 1 tablet  1 tablet Oral QHS PRN Jens Durand, MD       sertraline  (ZOLOFT ) tablet 200 mg  200 mg Oral Daily Jens Durand, MD   200 mg at 05/11/24 0944   sodium chloride  flush (NS) 0.9 % injection 10-40 mL  10-40 mL Intracatheter Q12H Jens Durand, MD   10 mL at 05/11/24 0945   tamsulosin  (FLOMAX ) capsule 0.8 mg  0.8 mg Oral QPC breakfast Jens Durand, MD   0.8 mg at 05/11/24 9176      Allergies: Allergies[1]    Past Medical History: Past Medical History:  Diagnosis Date   Anxiety    associated with frontal temporal behavioral dementia   Basal cell carcinoma 06/03/2021   Left nasal ala - Tx with radiation.   Dementia with behavioral disturbance (HCC)    Diabetes mellitus without complication (HCC)    History of traumatic head injury    Hypertension      Past Surgical History: Past Surgical History:  Procedure Laterality Date   Eye Socket fracture repair Right    HERNIA REPAIR     ROTATOR CUFF REPAIR Left    TONSILLECTOMY     WRIST FRACTURE SURGERY Right      Family History: Family History  Problem Relation Age of Onset   Stomach cancer Mother     Heart attack  Father    Diabetes Father    Colon cancer Neg Hx      Social History: Social History   Socioeconomic History   Marital status: Married    Spouse name: Not on file   Number of children: Not on file   Years of education: Not on file   Highest education level: Not on file  Occupational History   Not on file  Tobacco Use   Smoking status: Every Day    Current packs/day: 1.00    Average packs/day: 1 pack/day for 60.0 years (60.0 ttl pk-yrs)    Types: Cigarettes   Smokeless tobacco: Never  Vaping Use   Vaping status: Former   Substances: Nicotine  Substance and Sexual Activity   Alcohol  use: Yes    Alcohol /week: 1.0 standard drink of alcohol     Types: 1 Cans of beer per week    Comment: occ   Drug use: No   Sexual activity: Never  Other Topics Concern   Not on file  Social History Narrative   Not on file   Social Drivers of Health   Tobacco Use: High Risk (05/03/2024)   Patient History    Smoking Tobacco Use: Every Day    Smokeless Tobacco Use: Never    Passive Exposure: Not on file  Financial Resource Strain: Low Risk  (05/03/2024)   Received from [facility]   Overall Financial Resource Strain (CARDIA)    Difficulty of Paying Living Expenses: Not hard at all  Food Insecurity: No Food Insecurity (05/04/2024)   Epic    Worried About Radiation Protection Practitioner of Food in the Last Year: Never true    Ran Out of Food in the Last Year: Never true  Transportation Needs: No Transportation Needs (05/04/2024)   Epic    Lack of Transportation (Medical): No    Lack of Transportation (Non-Medical): No  Physical Activity: Not on file  Stress: Not on file  Social Connections: Unknown (05/04/2024)   Social Connection and Isolation Panel    Frequency of Communication with Friends and Family: Once a week    Frequency of Social Gatherings with Friends and Family: Twice a week    Attends Religious Services: 1 to 4 times per year    Active Member of [employer] or  Organizations: Patient declined    Attends Banker Meetings: Patient declined    Marital Status: Married  Catering Manager Violence: Not At Risk (05/04/2024)   Epic    Fear of Current or Ex-Partner: No    Emotionally Abused: No    Physically Abused: No    Sexually Abused: No  Depression (PHQ2-9): Not on file  Alcohol  Screen: Not on file  Housing: Low Risk (05/04/2024)   Epic    Unable to Pay for Housing in the Last Year: No    Number of Times Moved in the Last Year: 1    Homeless in the Last Year: No  Utilities: Not At Risk (05/04/2024)   Epic    Threatened with loss of utilities: No  Health Literacy: Not on file     Review of Systems: Unable to obtain review of systems directly from the patient as he had agitation.  Family reports restlessness and poor fluid intake.  Vital Signs: Blood pressure 100/63, pulse 80, temperature 97.8 F (36.6 C), temperature source Axillary, resp. rate (!) 24, height 5' 5 (1.651 m), weight 62.1 kg, SpO2 98%.  Weight trends: Filed Weights   05/09/24 0540 05/10/24 0500 05/11/24 0500  Weight: 69.3 kg 67.6 kg 62.1 kg     Physical Exam: General: Short periods of agitation  Head: Facial bruising noted dry oral mucosal membranes  Eyes: Periorbital ecchymoses  Neck: Supple  Lungs:  Clear to auscultation, normal effort  Heart: S1S2 no rubs  Abdomen:  Soft, nontender, bowel sounds present  Extremities: No peripheral edema.  Neurologic: Arousable, agitated at times  Skin: No acute rash  Access: No hemodialysis access    Lab results: Basic Metabolic Panel: Recent Labs  Lab 05/09/24 0849 05/10/24 0909 05/10/24 1756 05/11/24 0154 05/11/24 0346 05/11/24 0800 05/11/24 0924 05/11/24 1122  NA 152* 158*   < > 163*   < > 159* 159* 158*  K 3.5 3.9  --  3.5  --   --   --   --   CL 117* 122*  --  124*  --   --   --   --   CO2 21* 19*  --  25  --   --   --   --   GLUCOSE 247* 223*  --  89  --   --   --   --   BUN 62* 63*  --  77*   --   --   --   --   CREATININE 1.08 1.05  --  1.74*  --   --   --   --   CALCIUM  9.5 9.5  --  9.9  --   --   --   --   MG 3.3*  --   --   --   --   --   --   --   PHOS 3.1  --   --   --   --   --   --   --    < > = values in this interval not displayed.    Liver Function Tests: Recent Labs  Lab 05/05/24 0851 05/06/24 0626 05/07/24 0807 05/09/24 0849  AST 51* 38 44*  --   ALT 29 26 32  --   ALKPHOS 81 76 76  --   BILITOT 0.6 0.7 0.6  --   PROT 7.2 7.3 7.1  --   ALBUMIN 3.9 3.9 3.7 3.0*   No results for input(s): LIPASE, AMYLASE in the last 168 hours. Recent Labs  Lab 05/07/24 1155  AMMONIA 45*    CBC: Recent Labs  Lab 05/05/24 1119 05/06/24 0626 05/07/24 0807 05/09/24 0849 05/11/24 0346  WBC 13.5* 13.5* 12.6* 10.2 13.5*  NEUTROABS 8.8*  --  9.7*  --   --   HGB 14.8 15.5 14.0 12.8* 13.5  HCT 42.6 44.1 42.9 39.5 42.8  MCV 88.0 87.8 93.1 92.1 94.9  PLT 281 289 297 234 267    Cardiac Enzymes: No results for input(s): CKTOTAL, CKMB, CKMBINDEX, TROPONINI in the last 168 hours.  BNP: Invalid input(s): POCBNP  CBG: Recent Labs  Lab 05/11/24 0609 05/11/24 0702 05/11/24 0757 05/11/24 0917 05/11/24 1154  GLUCAP 166* 169* 171* 160* 222*    Microbiology: Results for orders placed or performed during the hospital encounter of 05/03/24  Resp panel by RT-PCR (RSV, Flu A&B, Covid) Anterior Nasal Swab     Status: None   Collection Time: 05/06/24  3:26 PM   Specimen: Anterior Nasal Swab  Result Value Ref Range Status   SARS Coronavirus 2 by RT PCR NEGATIVE NEGATIVE Final    Comment: (NOTE) SARS-CoV-2 target nucleic acids are NOT DETECTED.  The SARS-CoV-2 RNA is  generally detectable in upper respiratory specimens during the acute phase of infection. The lowest concentration of SARS-CoV-2 viral copies this assay can detect is 138 copies/mL. A negative result does not preclude SARS-Cov-2 infection and should not be used as the sole basis for  treatment or other patient management decisions. A negative result may occur with  improper specimen collection/handling, submission of specimen other than nasopharyngeal swab, presence of viral mutation(s) within the areas targeted by this assay, and inadequate number of viral copies(<138 copies/mL). A negative result must be combined with clinical observations, patient history, and epidemiological information. The expected result is Negative.  Fact Sheet for Patients:  bloggercourse.com  Fact Sheet for Healthcare Providers:  seriousbroker.it  This test is no t yet approved or cleared by the [place]  FDA and  has been authorized for detection and/or diagnosis of SARS-CoV-2 by FDA under an Emergency Use Authorization (EUA). This EUA will remain  in effect (meaning this test can be used) for the duration of the COVID-19 declaration under Section 564(b)(1) of the Act, 21 U.S.C.section 360bbb-3(b)(1), unless the authorization is terminated  or revoked sooner.       Influenza A by PCR NEGATIVE NEGATIVE Final   Influenza B by PCR NEGATIVE NEGATIVE Final    Comment: (NOTE) The Xpert Xpress SARS-CoV-2/FLU/RSV plus assay is intended as an aid in the diagnosis of influenza from Nasopharyngeal swab specimens and should not be used as a sole basis for treatment. Nasal washings and aspirates are unacceptable for Xpert Xpress SARS-CoV-2/FLU/RSV testing.  Fact Sheet for Patients: bloggercourse.com  Fact Sheet for Healthcare Providers: seriousbroker.it  This test is not yet approved or cleared by the [place]  FDA and has been authorized for detection and/or diagnosis of SARS-CoV-2 by FDA under an Emergency Use Authorization (EUA). This EUA will remain in effect (meaning this test can be used) for the duration of the COVID-19 declaration under Section 564(b)(1) of the Act, 21  U.S.C. section 360bbb-3(b)(1), unless the authorization is terminated or revoked.     Resp Syncytial Virus by PCR NEGATIVE NEGATIVE Final    Comment: (NOTE) Fact Sheet for Patients: bloggercourse.com  Fact Sheet for Healthcare Providers: seriousbroker.it  This test is not yet approved or cleared by the [place]  FDA and has been authorized for detection and/or diagnosis of SARS-CoV-2 by FDA under an Emergency Use Authorization (EUA). This EUA will remain in effect (meaning this test can be used) for the duration of the COVID-19 declaration under Section 564(b)(1) of the Act, 21 U.S.C. section 360bbb-3(b)(1), unless the authorization is terminated or revoked.  Performed at [facility], [address] [place], [place] [address]   Respiratory (~20 pathogens) panel by PCR     Status: None   Collection Time: 05/06/24  3:26 PM   Specimen: Nasopharyngeal Swab; Respiratory  Result Value Ref Range Status   Adenovirus NOT DETECTED NOT DETECTED Final   Coronavirus 229E NOT DETECTED NOT DETECTED Final    Comment: (NOTE) The Coronavirus on the Respiratory Panel, DOES NOT test for the novel  Coronavirus (2019 nCoV)    Coronavirus HKU1 NOT DETECTED NOT DETECTED Final   Coronavirus NL63 NOT DETECTED NOT DETECTED Final   Coronavirus OC43 NOT DETECTED NOT DETECTED Final   Metapneumovirus NOT DETECTED NOT DETECTED Final   Rhinovirus / Enterovirus NOT DETECTED NOT DETECTED Final   Influenza A NOT DETECTED NOT DETECTED Final   Influenza B NOT DETECTED NOT DETECTED Final   Parainfluenza Virus 1 NOT DETECTED NOT DETECTED Final  Parainfluenza Virus 2 NOT DETECTED NOT DETECTED Final   Parainfluenza Virus 3 NOT DETECTED NOT DETECTED Final   Parainfluenza Virus 4 NOT DETECTED NOT DETECTED Final   Respiratory Syncytial Virus NOT DETECTED NOT DETECTED Final   Bordetella pertussis NOT DETECTED NOT DETECTED Final   Bordetella  Parapertussis NOT DETECTED NOT DETECTED Final   Chlamydophila pneumoniae NOT DETECTED NOT DETECTED Final   Mycoplasma pneumoniae NOT DETECTED NOT DETECTED Final    Comment: Performed at [facility], [address] [place], [place] [address]    Coagulation Studies: No results for input(s): LABPROT, INR in the last 72 hours.  Urinalysis: Recent Labs    05/11/24 0256  COLORURINE YELLOW*  LABSPEC 1.020  PHURINE 5.0  GLUCOSEU >=500*  HGBUR NEGATIVE  BILIRUBINUR NEGATIVE  KETONESUR NEGATIVE  PROTEINUR NEGATIVE  NITRITE NEGATIVE  LEUKOCYTESUR NEGATIVE      Imaging: No results found.   Assessment & Plan: Pt is a 78 y.o. male with a PMHx of hypertension, hyperlipidemia, diabetes mellitus type 2, dementia, prior history of traumatic brain injury, who was admitted to [facility] on 05/03/2024 for evaluation of traumatic brain injury.  He was brought to the hospital by his wife as facial bruising was noted.  1.  Hypernatremia likely secondary to decreased free water intake. 2.  Acute kidney injury. 3.  Traumatic brain injury with bifrontal contusions, such arachnoid hemorrhage.  Plan: We were asked to see the patient for evaluation management of hyponatremia in the setting of recent traumatic brain injury with bifrontal contusions and subarachnoid hemorrhage.  Neurosurgery on the case.  It appears that he had decreased free water intake.  He does not appear to have excessive amounts of urination at this time.  He was already started on D5W at 75 cc/h.  Will increase this to 100 cc/h.  Target sodium correction in the next 24 hours is 8 to 10 mEq.  Patient's family updated at the bedside regarding this current plan.  Thank you for consultation.       [1]  Allergies Allergen Reactions   Metformin  Diarrhea

## 2024-05-11 NOTE — Plan of Care (Signed)
°  Problem: Intracerebral Hemorrhage Tissue Perfusion: Goal: Complications of Intracerebral Hemorrhage will be minimized Outcome: Progressing   Problem: Coping: Goal: Will identify appropriate support needs Outcome: Progressing   Problem: Health Behavior/Discharge Planning: Goal: Goals will be collaboratively established with patient/family Outcome: Progressing   Problem: Clinical Measurements: Goal: Ability to maintain clinical measurements within normal limits will improve Outcome: Progressing Goal: Will remain free from infection Outcome: Progressing Goal: Diagnostic test results will improve Outcome: Progressing   Problem: Coping: Goal: Level of anxiety will decrease Outcome: Progressing   Problem: Safety: Goal: Ability to remain free from injury will improve Outcome: Progressing

## 2024-05-11 NOTE — Evaluation (Signed)
 Clinical/Bedside Swallow Evaluation Patient Details  Name: Dustin Ferrell MRN: [id number] Date of Birth: [birth year]  Today's Date: 05/11/2024 Time: SLP Start Time (ACUTE ONLY): 1150 SLP Stop Time (ACUTE ONLY): 1220 SLP Time Calculation (min) (ACUTE ONLY): 30 min  Past Medical History:  Past Medical History:  Diagnosis Date   Anxiety    associated with frontal temporal behavioral dementia   Basal cell carcinoma 06/03/2021   Left nasal ala - Tx with radiation.   Dementia with behavioral disturbance (HCC)    Diabetes mellitus without complication (HCC)    History of traumatic head injury    Hypertension    Past Surgical History:  Past Surgical History:  Procedure Laterality Date   Eye Socket fracture repair Right    HERNIA REPAIR     ROTATOR CUFF REPAIR Left    TONSILLECTOMY     WRIST FRACTURE SURGERY Right    HPI:  Per critical care progress note, The patient is an 78 year old male with a PMHx of HTN, HLD, T2DM, prior TBI with residual cognitive impairment and superimposed dementia, who presented on 05/03/2024 after family noted facial bruising following episodes of vomiting and upper respiratory symptoms. The patient denied trauma but has poor memory at baseline per family. Severe hypernatremia noted on 12/12 prompting transition to ICU level care. MRI:  Multifocal posttraumatic intracranial hemorrhage not significantly changed  from 05/05/2024 CT head: Subdural hematomas - left convexity (6-7 mm), right  convexity (2 mm), and bilateral posterior fossa measuring 2 mm. Right > left  anterior frontal lobe and left anterior temporal lobe Hemorrhagic contusions.  Scattered bilateral Subarachnoid hemorrhages. Small volume intraventricular  hemorrhagic hemorrhage.  2. Effaced suprasellar cistern and mild rightward midline shift (3 mm). No  other significant intracranial mass effect. Basilar cisterns are patent. CXR 05/03/24: No acute cardiopulmonary process.    Assessment / Plan /  Recommendation  Clinical Impression  Pt seen for bedside swallow evaluation in the setting of RN/NP report of oral pocketing and coughing with thin liquids. Pt made NPO this shift and SLP eval consulted. No hx of dysphagia noted in chart review. Pt alert, with intermittent command following. Oral motor function grossly intact. Bottom dentures in place, limited command following for placement of top dentures. Oral care completed. Limited oral acceptance noted, with max verbal, tactile, visual cues and extended time provided for completion of minimal PO trials. Trials completed of thin liquids via straw, nectar thick via cup, and puree. Vitals stable for duration of trials (O2 remaining in the high 90's). Immediate cough noted with thin liquids, not replicated with small cup sips of nectar thick trials. Oral phase directly impacted by mentation, leading to prolonged A-P transfer for puree.   Based on current medical compromise (acute ICH/SDH/SAH, encephalopathy, hypernatremia) and chronic cognitive factors (hx of TBI with superimposed dementia), pt is at increased risk for aspiration. Recommend strict aspiration precautions (slow rate, small bites, elevated HOB, and alert for PO intake). Further recommend puree (for ease of intake) and nectar thick liquids. Close monitoring for oral holding/pocketing, oral care following completion of meal. To aid pt's awareness for PO intake, encourage pt to hold cup. NO straws. Suspect that with medical/mentation stabilization, pt will be able to advance diet. SLP will monitor for safety with PO diet. NP and RN aware of recommendations.    SLP Visit Diagnosis: Dysphagia, unspecified (R13.10) (suspect related to acute deconditioning, AMS, baseline cognitive status, and current medical compromise)    Aspiration Risk  Moderate aspiration risk  Diet Recommendation   Nectar;Dysphagia 1 (puree)  Medication Administration: Crushed with puree    Other Recommendations Oral  Care Recommendations: Oral care before and after PO;Staff/trained caregiver to provide oral care      Assistance Recommended at Discharge  Supervision and assist with PO intake  Functional Status Assessment Patient has had a recent decline in their functional status and demonstrates the ability to make significant improvements in function in a reasonable and predictable amount of time.  Frequency and Duration min 2x/week  2 weeks       Prognosis Prognosis for improved oropharyngeal function: Fair Barriers to Reach Goals: Cognitive deficits;Severity of deficits      Swallow Study   General Date of Onset: 05/11/24 HPI: Per critical care progress note, The patient is an 78 year old male with a PMHx of HTN, HLD, T2DM, prior TBI with residual cognitive impairment and superimposed dementia, who presented on 05/03/2024 after family noted facial bruising following episodes of vomiting and upper respiratory symptoms. The patient denied trauma but has poor memory at baseline per family. Severe hypernatremia noted on 12/12 prompting transition to ICU level care. MRI:  Multifocal posttraumatic intracranial hemorrhage not significantly changed  from 05/05/2024 CT head: Subdural hematomas - left convexity (6-7 mm), right  convexity (2 mm), and bilateral posterior fossa measuring 2 mm. Right > left  anterior frontal lobe and left anterior temporal lobe Hemorrhagic contusions.  Scattered bilateral Subarachnoid hemorrhages. Small volume intraventricular  hemorrhagic hemorrhage.  2. Effaced suprasellar cistern and mild rightward midline shift (3 mm). No  other significant intracranial mass effect. Basilar cisterns are patent. CXR 05/03/24: No acute cardiopulmonary process. Type of Study: Bedside Swallow Evaluation Previous Swallow Assessment: none in chart Diet Prior to this Study: NPO Temperature Spikes Noted:  (WBC 13.5) Respiratory Status: Nasal cannula (2L) History of Recent Intubation:  No Behavior/Cognition: Confused;Alert Oral Cavity Assessment: Within Functional Limits Oral Care Completed by SLP: Yes Oral Cavity - Dentition: Dentures, bottom (top dentures in room- not placed for assessment) Self-Feeding Abilities: Total assist Patient Positioning: Upright in bed Baseline Vocal Quality: Low vocal intensity Volitional Cough: Cognitively unable to elicit Volitional Swallow: Unable to elicit    Oral/Motor/Sensory Function Overall Oral Motor/Sensory Function: Within functional limits   Ice Chips Ice chips:  (no oral acceptance) Presentation: Spoon   Thin Liquid Thin Liquid: Impaired Presentation: Straw;Cup (no acceptance for spoon) Oral Phase Impairments: Poor awareness of bolus Pharyngeal  Phase Impairments: Cough - Immediate    Nectar Thick Nectar Thick Liquid: Within functional limits Presentation: Cup   Honey Thick Honey Thick Liquid: Not tested   Puree Puree: Impaired Presentation: Spoon Oral Phase Impairments: Poor awareness of bolus Oral Phase Functional Implications: Prolonged oral transit   Solid     Solid: Not tested     Kwali Wrinkle Clapp, MS, CCC-SLP Speech Language Pathologist Rehab Services; [facility] [phone] (ascom)   Naphtali Riede J Clapp 05/11/2024,1:31 PM

## 2024-05-11 NOTE — Consult Note (Signed)
 Consultation Note Date: 05/11/2024   Patient Name: Dustin Ferrell  DOB: 1945-11-03  MRN: [id number]  Age / Sex: 78 y.o., male  PCP: [name], MD Referring Physician: Isadora Hose, MD  Reason for Consultation: Establishing goals of care   HPI/Brief Hospital Course: 78 y.o. 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  CT head revealing areas of hemorrhagic contusions, SDH and subarachnoid hemorrhage--neurosurgery consulted--no surgical interventions CT facial revealing parietal skull fracture  Hospital course has been complicated by development of A-fib with RVR and agitation as well as development of significant hypernatremia--slowly improving  Palliative medicine was consulted for assisting with goals of care conversations.  Subjective:  Extensive chart review has been completed prior to meeting patient including labs, vital signs, imaging, progress notes, orders, and available advanced directive documents from current and previous encounters.  Visited with Mr. Goyne at his bedside.  He is resting in bed with eyes closed, he does not follow commands or acknowledge my presence in the room.  He becomes agitated with movement in the bed including automatic blood pressure readings--easily redirected by family.  Wife and 2 daughters at bedside during time of visit.  Introduced myself as a publishing rights manager as a member of the palliative care team. Explained palliative medicine is specialized medical care for people living with serious illness. It focuses on providing relief from the symptoms and stress of a serious illness. The goal is to improve quality of life for both the patient and the family.   Family familiar with palliative care services.  Daughter at bedside is a  nutritional therapist.  She is well aware of her fathers current medical condition and has open discussions with her family.  Family provides a brief life review.  He and his wife have been married for many years.  There are 2 daughters in total.  They describe Mr. Mich as an active individual.  Since his brain injury they describe him as remaining active but withdrawn.  At baseline prior to acute injury and admission Mr. Lavine remained independent in being able to care for himself.  Family able to explain their understanding of hospitalization and current medical condition.  We discussed concern related to hypernatremia likely being multifactorial but overall concern related to poor p.o. intake contributing to this problem.  Family aware at current time Mr. Kimple remains n.p.o. pending SLP evaluation.  We also discussed concern for Mr. Corkum getting back to a place to be able to meet his nutritional and hydration demands.  Alternative measures such as artificial nutrition and hydration through feeding tubes were discussed with family.  At this time family is clear they do not wish to pursue artificial nutrition or hydration through feeding tubes.  We discussed patient's current illness and what it means in the larger context of patient's on-going co-morbidities. Natural disease trajectory and expectations at EOL were discussed.   At request of family we discussed CODE STATUS and the difference between full code and DO NOT RESUSCITATE. Encouraged family to consider DNR/DNI status understanding evidenced based poor outcomes in similar hospitalized patients, as the cause of the arrest is likely associated with chronic/terminal disease rather than a reversible acute cardio-pulmonary event.  All family in agreement for CODE STATUS to remain DNR/DNI at this time.  Family expressed importance on considering quality of life for Mr.  Alewine.  Unacceptable quality of life for Mr. Striplin as reported by  family would be having an awareness of his surroundings and being able to interact and engage with his loved ones.  At this time family wishes for time for recovery and outcomes and agreed to ongoing goals of care conversations with PMT service.  I discussed importance of continued conversations with family/support persons and all members of their medical team regarding overall plan of care and treatment options ensuring decisions are in alignment with patients goals of care.  All questions/concerns addressed. Emotional support provided to patient/family/support persons. PMT will continue to follow and support patient as needed.   Objective: Primary Diagnoses: Present on Admission:  Hypertension associated with diabetes (HCC)  Dementia without behavioral disturbance (HCC)   Physical Exam Constitutional:      General: He is not in acute distress.    Appearance: He is ill-appearing.  HENT:     Head:     Comments: Facial bruising    Mouth/Throat:     Mouth: Mucous membranes are dry.  Eyes:     Comments: Ecchymosis to bilateral eyes  Pulmonary:     Effort: Pulmonary effort is normal. No respiratory distress.  Abdominal:     Palpations: Abdomen is soft.  Skin:    General: Skin is warm and dry.     Findings: Bruising present.     Vital Signs: BP (!) 132/54   Pulse 89   Temp 97.8 F (36.6 C) (Axillary)   Resp (!) 35   Ht 5' 5 (1.651 m)   Wt 62.1 kg   SpO2 100%   BMI 22.78 kg/m  Pain Scale: PAINAD   Pain Score: 0-No pain   IO: Intake/output summary:  Intake/Output Summary (Last 24 hours) at 05/11/2024 1532 Last data filed at 05/11/2024 1224 Gross per 24 hour  Intake 1142.6 ml  Output 600 ml  Net 542.6 ml    LBM: Last BM Date : 05/11/24 Baseline Weight: Weight: 74.8 kg Most recent weight: Weight: 62.1 kg      Assessment and Plan  SUMMARY OF RECOMMENDATIONS   DNR/DNI remains Time for outcomes and recovery  Palliative Prophylaxis:   Bowel Regimen,  Delirium Protocol and Frequent Pain Assessment  Discussed With: CCM team and nursing staff   Thank you for this consult and allowing Palliative Medicine to participate in the care of Fairy PARAS. Falcon. Palliative medicine will continue to follow and assist as needed.   Visit includes: Detailed review of medical records (labs, imaging, vital signs), medically appropriate exam (mental status, respiratory, cardiac, skin), discussed with treatment team, counseling and educating patient, family and staff, documenting clinical information, medication management and coordination of care.   Signed by: Waddell Lesches, DNP, AGNP-C Palliative Medicine    Please contact Palliative Medicine Team phone at [phone] for questions and concerns.  For individual provider: See Tracey

## 2024-05-12 LAB — BASIC METABOLIC PANEL WITH GFR
Anion gap: 17 — ABNORMAL HIGH (ref 5–15)
Anion gap: 16 — ABNORMAL HIGH (ref 5–15)
BUN: 88 mg/dL — ABNORMAL HIGH (ref 8–23)
BUN: 81 mg/dL — ABNORMAL HIGH (ref 8–23)
CO2: 19 mmol/L — ABNORMAL LOW (ref 22–32)
CO2: 18 mmol/L — ABNORMAL LOW (ref 22–32)
Calcium: 8.9 mg/dL (ref 8.9–10.3)
Calcium: 8.9 mg/dL (ref 8.9–10.3)
Chloride: 119 mmol/L — ABNORMAL HIGH (ref 98–111)
Chloride: 121 mmol/L — ABNORMAL HIGH (ref 98–111)
Creatinine, Ser: 1.48 mg/dL — ABNORMAL HIGH (ref 0.61–1.24)
Creatinine, Ser: 1.34 mg/dL — ABNORMAL HIGH (ref 0.61–1.24)
GFR, Estimated: 48 mL/min — ABNORMAL LOW (ref >60)
GFR, Estimated: 54 mL/min — ABNORMAL LOW (ref >60)
Glucose, Bld: 136 mg/dL — ABNORMAL HIGH (ref 70–99)
Glucose, Bld: 237 mg/dL — ABNORMAL HIGH (ref 70–99)
Potassium: 3.7 mmol/L (ref 3.5–5.1)
Potassium: 3.6 mmol/L (ref 3.5–5.1)
Sodium: 156 mmol/L — ABNORMAL HIGH (ref 135–145)
Sodium: 155 mmol/L — ABNORMAL HIGH (ref 135–145)

## 2024-05-12 LAB — CBC
HCT: 37 % — ABNORMAL LOW (ref 39.0–52.0)
Hemoglobin: 11.8 g/dL — ABNORMAL LOW (ref 13.0–17.0)
MCH: 29.8 pg (ref 26.0–34.0)
MCHC: 31.9 g/dL (ref 30.0–36.0)
MCV: 93.4 fL (ref 80.0–100.0)
Platelets: 242 K/uL (ref 150–400)
RBC: 3.96 MIL/uL — ABNORMAL LOW (ref 4.22–5.81)
RDW: 14.8 % (ref 11.5–15.5)
WBC: 12 K/uL — ABNORMAL HIGH (ref 4.0–10.5)
nRBC: 0.2 % (ref 0.0–0.2)

## 2024-05-12 LAB — SODIUM
Sodium: 157 mmol/L — ABNORMAL HIGH (ref 135–145)
Sodium: 157 mmol/L — ABNORMAL HIGH (ref 135–145)
Sodium: 158 mmol/L — ABNORMAL HIGH (ref 135–145)
Sodium: 157 mmol/L — ABNORMAL HIGH (ref 135–145)

## 2024-05-12 LAB — GLUCOSE, CAPILLARY
Glucose-Capillary: 128 mg/dL — ABNORMAL HIGH (ref 70–99)
Glucose-Capillary: 159 mg/dL — ABNORMAL HIGH (ref 70–99)
Glucose-Capillary: 202 mg/dL — ABNORMAL HIGH (ref 70–99)
Glucose-Capillary: 260 mg/dL — ABNORMAL HIGH (ref 70–99)
Glucose-Capillary: 310 mg/dL — ABNORMAL HIGH (ref 70–99)
Glucose-Capillary: 334 mg/dL — ABNORMAL HIGH (ref 70–99)
Glucose-Capillary: 390 mg/dL — ABNORMAL HIGH (ref 70–99)
Glucose-Capillary: 397 mg/dL — ABNORMAL HIGH (ref 70–99)
Glucose-Capillary: 459 mg/dL — ABNORMAL HIGH (ref 70–99)

## 2024-05-12 LAB — MAGNESIUM: Magnesium: 3.5 mg/dL — ABNORMAL HIGH (ref 1.7–2.4)

## 2024-05-12 LAB — OSMOLALITY, URINE: Osmolality, Ur: 716 mosm/kg (ref 300–900)

## 2024-05-12 MED ORDER — LORAZEPAM 2 MG/ML IJ SOLN
0.5000 mg | INTRAMUSCULAR | Status: DC | PRN
  Administered 2024-05-12: 0.5 mg via INTRAVENOUS
  Filled 2024-05-12: qty 1

## 2024-05-12 MED ORDER — DEXTROSE 5 % IV SOLN: INTRAVENOUS | Status: DC

## 2024-05-12 MED ORDER — INSULIN ASPART 100 UNIT/ML IJ SOLN
0.0000 [IU] | INTRAMUSCULAR | Status: DC
  Administered 2024-05-12: 15 [IU] via SUBCUTANEOUS
  Filled 2024-05-12: qty 15

## 2024-05-12 MED ORDER — POTASSIUM CHLORIDE 20 MEQ PO PACK
40.0000 meq | PACK | Freq: Once | ORAL | Status: AC
  Administered 2024-05-12: 40 meq via ORAL
  Filled 2024-05-12: qty 2

## 2024-05-12 MED ORDER — ZIPRASIDONE MESYLATE 20 MG IM SOLR
10.0000 mg | Freq: Four times a day (QID) | INTRAMUSCULAR | Status: DC | PRN
  Administered 2024-05-13: 14:00:00 10 mg via INTRAMUSCULAR
  Filled 2024-05-12 (×2): qty 20

## 2024-05-12 MED ADMIN — Haloperidol Lactate Inj 5 MG/ML: 2 mg | INTRAMUSCULAR | NDC 67457042600

## 2024-05-12 NOTE — Plan of Care (Signed)
  Problem: Education: Goal: Knowledge of disease or condition will improve Outcome: Progressing Goal: Knowledge of secondary prevention will improve (MUST DOCUMENT ALL) Outcome: Progressing Goal: Knowledge of patient specific risk factors will improve (DELETE if not current risk factor) Outcome: Progressing   Problem: Intracerebral Hemorrhage Tissue Perfusion: Goal: Complications of Intracerebral Hemorrhage will be minimized Outcome: Progressing   Problem: Coping: Goal: Will verbalize positive feelings about self Outcome: Progressing Goal: Will identify appropriate support needs Outcome: Progressing   Problem: Health Behavior/Discharge Planning: Goal: Ability to manage health-related needs will improve Outcome: Progressing Goal: Goals will be collaboratively established with patient/family Outcome: Progressing   Problem: Self-Care: Goal: Ability to participate in self-care as condition permits will improve Outcome: Progressing Goal: Verbalization of feelings and concerns over difficulty with self-care will improve Outcome: Progressing Goal: Ability to communicate needs accurately will improve Outcome: Progressing   Problem: Nutrition: Goal: Risk of aspiration will decrease Outcome: Progressing Goal: Dietary intake will improve Outcome: Progressing   Problem: Education: Goal: Ability to describe self-care measures that may prevent or decrease complications (Diabetes Survival Skills Education) will improve Outcome: Progressing Goal: Individualized Educational Video(s) Outcome: Progressing   Problem: Coping: Goal: Ability to adjust to condition or change in health will improve Outcome: Progressing   Problem: Fluid Volume: Goal: Ability to maintain a balanced intake and output will improve Outcome: Progressing   Problem: Health Behavior/Discharge Planning: Goal: Ability to identify and utilize available resources and services will improve Outcome: Progressing Goal:  Ability to manage health-related needs will improve Outcome: Progressing   Problem: Metabolic: Goal: Ability to maintain appropriate glucose levels will improve Outcome: Progressing   Problem: Nutritional: Goal: Maintenance of adequate nutrition will improve Outcome: Progressing Goal: Progress toward achieving an optimal weight will improve Outcome: Progressing   Problem: Skin Integrity: Goal: Risk for impaired skin integrity will decrease Outcome: Progressing   Problem: Tissue Perfusion: Goal: Adequacy of tissue perfusion will improve Outcome: Progressing   Problem: Education: Goal: Knowledge of General Education information will improve Description: Including pain rating scale, medication(s)/side effects and non-pharmacologic comfort measures Outcome: Progressing   Problem: Health Behavior/Discharge Planning: Goal: Ability to manage health-related needs will improve Outcome: Progressing   Problem: Clinical Measurements: Goal: Ability to maintain clinical measurements within normal limits will improve Outcome: Progressing Goal: Will remain free from infection Outcome: Progressing Goal: Diagnostic test results will improve Outcome: Progressing Goal: Respiratory complications will improve Outcome: Progressing Goal: Cardiovascular complication will be avoided Outcome: Progressing   Problem: Activity: Goal: Risk for activity intolerance will decrease Outcome: Progressing   Problem: Nutrition: Goal: Adequate nutrition will be maintained Outcome: Progressing   Problem: Coping: Goal: Level of anxiety will decrease Outcome: Progressing   Problem: Elimination: Goal: Will not experience complications related to bowel motility Outcome: Progressing Goal: Will not experience complications related to urinary retention Outcome: Progressing   Problem: Pain Managment: Goal: General experience of comfort will improve and/or be controlled Outcome: Progressing   Problem:  Safety: Goal: Ability to remain free from injury will improve Outcome: Progressing   Problem: Skin Integrity: Goal: Risk for impaired skin integrity will decrease Outcome: Progressing

## 2024-05-12 NOTE — Progress Notes (Signed)
 Daily Progress Note   Patient Name: Dustin Ferrell       Date: 05/12/2024 DOB: [birth year]  Age: 78 y.o. MRN#: [id number] Attending Physician: Marsa Edelman, DO Primary Care Physician: [name], MD Admit Date: 05/03/2024  Reason for Consultation/Follow-up: Establishing goals of care  HPI/Brief Hospital Review: 78 y.o. 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   CT head revealing areas of hemorrhagic contusions, SDH and subarachnoid hemorrhage--neurosurgery consulted--no surgical interventions CT facial revealing parietal skull fracture   Hospital course has been complicated by development of A-fib with RVR and agitation as well as development of significant hypernatremia--slowly improving  12/14 sodium improved to 157, renal function improving   Palliative medicine was consulted for assisting with goals of care conversations.  Subjective: Extensive chart review has been completed prior to meeting patient including labs, vital signs, imaging, progress notes, orders, and available advanced directive documents from current and previous encounters.    Visited with Mr. [name] at his bedside. He is resting in bed with eyes closed but acknowledges my presence in room and calling of his name with eye opening, attempts verbal communication. Becomes slightly agitate but easily redirected by his wife at bedside.  Review of SLP visit, has been placed on dysphagia I diet with nectar thickened liquids. Wife at bedside reports good intake at dinner yesterday evening and fair intake for breakfast. She is thankful for  his ability to be started on a diet. Seems to have been slight improvement in neurological status today as well. Wife remains hopeful for ongoing recovery. Will attempt to round again in afternoon to speak with daughters.  Answered and addressed all questions and concerns. PMT to continue to follow for ongoing needs and support.  Objective:  Physical Exam Constitutional:      General: He is not in acute distress.    Appearance: He is ill-appearing.  HENT:     Head:     Comments: Ecchymosis to bilateral eyes and facial structures    Mouth/Throat:     Mouth: Mucous membranes are dry.  Neurological:     Mental Status: He is easily aroused.  Vital Signs: BP 137/87 (BP Location: Right Arm)   Pulse 95   Temp 98.4 F (36.9 C)   Resp 17   Ht 5' 5 (1.651 m)   Wt 68.5 kg   SpO2 97%   BMI 25.13 kg/m  SpO2: SpO2: 97 % O2 Device: O2 Device: Room Air O2 Flow Rate: O2 Flow Rate (L/min): 2 L/min   Palliative Care Assessment & Plan   Assessment/Recommendation/Plan  Time for improvement and recovery Correction of hypernatremia Monitoring of PO intake  Care plan was discussed with nursing staff.  Thank you for allowing the Palliative Medicine Team to assist in the care of this patient.  Visit includes: Detailed review of medical records (labs, imaging, vital signs), medically appropriate exam (mental status, respiratory, cardiac, skin), discussed with treatment team, counseling and educating patient, family and staff, documenting clinical information, medication management and coordination of care.  Waddell Lesches, DNP, AGNP-C Palliative Medicine   Please contact Palliative Medicine Team phone at [phone] for questions and concerns.

## 2024-05-12 NOTE — Plan of Care (Signed)
°  Problem: Tissue Perfusion: Goal: Adequacy of tissue perfusion will improve Outcome: Progressing   Problem: Education: Goal: Knowledge of disease or condition will improve Outcome: Not Progressing   Problem: Coping: Goal: Will verbalize positive feelings about self Outcome: Not Progressing Goal: Will identify appropriate support needs Outcome: Not Progressing   Problem: Health Behavior/Discharge Planning: Goal: Ability to identify and utilize available resources and services will improve Outcome: Not Progressing Goal: Ability to manage health-related needs will improve Outcome: Not Progressing

## 2024-05-12 NOTE — Progress Notes (Signed)
 [facility]  ROUNDING NOTE   Subjective:   Patient noted to be agitated this a.m. Daughter at bedside. D5W had been stopped. Serum sodium today was 155. Case discussed with ICU nurse. We agreed to restart the patient on D5W at 100 cc/h.  Objective:  Vital signs in last 24 hours:  Temp:  [97.7 F (36.5 C)-98.1 F (36.7 C)] 98.1 F (36.7 C) (12/14 0800) Pulse Rate:  [75-106] 103 (12/14 1200) Resp:  [16-35] 22 (12/14 1200) BP: (81-153)/(45-119) 153/118 (12/14 1200) SpO2:  [90 %-100 %] 98 % (12/14 1200) Weight:  [68.5 kg] 68.5 kg (12/14 0500)  Weight change: 6.4 kg Filed Weights   05/10/24 0500 05/11/24 0500 05/12/24 0500  Weight: 67.6 kg 62.1 kg 68.5 kg    Intake/Output: I/O last 3 completed shifts: In: 1142.6 [I.V.:1142.6] Out: 875 [Urine:675; Stool:200]   Intake/Output this shift:  No intake/output data recorded.  Physical Exam: General: Agitated  Head: Facial bruising noted.  Dry oral mucosal membranes  Neck: Supple  Lungs:  Clear to auscultation, normal effort  Heart: S1S2 no rubs  Abdomen:  Soft, nontender, bowel sounds present  Extremities: No peripheral edema.  Neurologic: Arousable, agitated  Skin: No acute rash  Access: No hemodialysis access    Basic Metabolic Panel: Recent Labs  Lab 05/09/24 0849 05/10/24 0909 05/10/24 1756 05/11/24 0154 05/11/24 0346 05/11/24 1824 05/11/24 2053 05/12/24 0112 05/12/24 0316 05/12/24 0909  NA 152* 158*   < > 163*   < > 156* 155* 157* 156* 155*  K 3.5 3.9  --  3.5  --  3.2*  --   --  3.7 3.6  CL 117* 122*  --  124*  --  116*  --   --  119* 121*  CO2 21* 19*  --  25  --  16*  --   --  19* 18*  GLUCOSE 247* 223*  --  89  --  411*  --   --  136* 237*  BUN 62* 63*  --  77*  --  90*  --   --  88* 81*  CREATININE 1.08 1.05  --  1.74*  --  1.87*  --   --  1.48* 1.34*  CALCIUM  9.5 9.5  --  9.9  --  8.8*  --   --  8.9 8.9  MG 3.3*  --   --   --   --   --   --   --   --  3.5*  PHOS 3.1  --   --   --    --   --   --   --   --   --    < > = values in this interval not displayed.    Liver Function Tests: Recent Labs  Lab 05/06/24 0626 05/07/24 0807 05/09/24 0849  AST 38 44*  --   ALT 26 32  --   ALKPHOS 76 76  --   BILITOT 0.7 0.6  --   PROT 7.3 7.1  --   ALBUMIN 3.9 3.7 3.0*   No results for input(s): LIPASE, AMYLASE in the last 168 hours. Recent Labs  Lab 05/07/24 1155  AMMONIA 45*    CBC: Recent Labs  Lab 05/06/24 0626 05/07/24 0807 05/09/24 0849 05/11/24 0346 05/12/24 0316  WBC 13.5* 12.6* 10.2 13.5* 12.0*  NEUTROABS  --  9.7*  --   --   --   HGB 15.5 14.0 12.8* 13.5 11.8*  HCT 44.1 42.9  39.5 42.8 37.0*  MCV 87.8 93.1 92.1 94.9 93.4  PLT 289 297 234 267 242    Cardiac Enzymes: No results for input(s): CKTOTAL, CKMB, CKMBINDEX, TROPONINI in the last 168 hours.  BNP: Invalid input(s): POCBNP  CBG: Recent Labs  Lab 05/11/24 1943 05/12/24 0033 05/12/24 0458 05/12/24 0737 05/12/24 1102  GLUCAP 304* 128* 159* 202* 260*    Microbiology: Results for orders placed or performed during the hospital encounter of 05/03/24  Resp panel by RT-PCR (RSV, Flu A&B, Covid) Anterior Nasal Swab     Status: None   Collection Time: 05/06/24  3:26 PM   Specimen: Anterior Nasal Swab  Result Value Ref Range Status   SARS Coronavirus 2 by RT PCR NEGATIVE NEGATIVE Final    Comment: (NOTE) SARS-CoV-2 target nucleic acids are NOT DETECTED.  The SARS-CoV-2 RNA is generally detectable in upper respiratory specimens during the acute phase of infection. The lowest concentration of SARS-CoV-2 viral copies this assay can detect is 138 copies/mL. A negative result does not preclude SARS-Cov-2 infection and should not be used as the sole basis for treatment or other patient management decisions. A negative result may occur with  improper specimen collection/handling, submission of specimen other than nasopharyngeal swab, presence of viral mutation(s) within  the areas targeted by this assay, and inadequate number of viral copies(<138 copies/mL). A negative result must be combined with clinical observations, patient history, and epidemiological information. The expected result is Negative.  Fact Sheet for Patients:  bloggercourse.com  Fact Sheet for Healthcare Providers:  seriousbroker.it  This test is no t yet approved or cleared by the [place]  FDA and  has been authorized for detection and/or diagnosis of SARS-CoV-2 by FDA under an Emergency Use Authorization (EUA). This EUA will remain  in effect (meaning this test can be used) for the duration of the COVID-19 declaration under Section 564(b)(1) of the Act, 21 U.S.C.section 360bbb-3(b)(1), unless the authorization is terminated  or revoked sooner.       Influenza A by PCR NEGATIVE NEGATIVE Final   Influenza B by PCR NEGATIVE NEGATIVE Final    Comment: (NOTE) The Xpert Xpress SARS-CoV-2/FLU/RSV plus assay is intended as an aid in the diagnosis of influenza from Nasopharyngeal swab specimens and should not be used as a sole basis for treatment. Nasal washings and aspirates are unacceptable for Xpert Xpress SARS-CoV-2/FLU/RSV testing.  Fact Sheet for Patients: bloggercourse.com  Fact Sheet for Healthcare Providers: seriousbroker.it  This test is not yet approved or cleared by the [place]  FDA and has been authorized for detection and/or diagnosis of SARS-CoV-2 by FDA under an Emergency Use Authorization (EUA). This EUA will remain in effect (meaning this test can be used) for the duration of the COVID-19 declaration under Section 564(b)(1) of the Act, 21 U.S.C. section 360bbb-3(b)(1), unless the authorization is terminated or revoked.     Resp Syncytial Virus by PCR NEGATIVE NEGATIVE Final    Comment: (NOTE) Fact Sheet for  Patients: bloggercourse.com  Fact Sheet for Healthcare Providers: seriousbroker.it  This test is not yet approved or cleared by the [place]  FDA and has been authorized for detection and/or diagnosis of SARS-CoV-2 by FDA under an Emergency Use Authorization (EUA). This EUA will remain in effect (meaning this test can be used) for the duration of the COVID-19 declaration under Section 564(b)(1) of the Act, 21 U.S.C. section 360bbb-3(b)(1), unless the authorization is terminated or revoked.  Performed at [facility], [address], [place], [place] [address]  Respiratory (~20 pathogens) panel by PCR     Status: None   Collection Time: 05/06/24  3:26 PM   Specimen: Nasopharyngeal Swab; Respiratory  Result Value Ref Range Status   Adenovirus NOT DETECTED NOT DETECTED Final   Coronavirus 229E NOT DETECTED NOT DETECTED Final    Comment: (NOTE) The Coronavirus on the Respiratory Panel, DOES NOT test for the novel  Coronavirus (2019 nCoV)    Coronavirus HKU1 NOT DETECTED NOT DETECTED Final   Coronavirus NL63 NOT DETECTED NOT DETECTED Final   Coronavirus OC43 NOT DETECTED NOT DETECTED Final   Metapneumovirus NOT DETECTED NOT DETECTED Final   Rhinovirus / Enterovirus NOT DETECTED NOT DETECTED Final   Influenza A NOT DETECTED NOT DETECTED Final   Influenza B NOT DETECTED NOT DETECTED Final   Parainfluenza Virus 1 NOT DETECTED NOT DETECTED Final   Parainfluenza Virus 2 NOT DETECTED NOT DETECTED Final   Parainfluenza Virus 3 NOT DETECTED NOT DETECTED Final   Parainfluenza Virus 4 NOT DETECTED NOT DETECTED Final   Respiratory Syncytial Virus NOT DETECTED NOT DETECTED Final   Bordetella pertussis NOT DETECTED NOT DETECTED Final   Bordetella Parapertussis NOT DETECTED NOT DETECTED Final   Chlamydophila pneumoniae NOT DETECTED NOT DETECTED Final   Mycoplasma pneumoniae NOT DETECTED NOT DETECTED Final    Comment: Performed at  [facility], [address], [place], [place] [address]    Coagulation Studies: No results for input(s): LABPROT, INR in the last 72 hours.  Urinalysis: Recent Labs    05/11/24 0256  COLORURINE YELLOW*  LABSPEC 1.020  PHURINE 5.0  GLUCOSEU >=500*  HGBUR NEGATIVE  BILIRUBINUR NEGATIVE  KETONESUR NEGATIVE  PROTEINUR NEGATIVE  NITRITE NEGATIVE  LEUKOCYTESUR NEGATIVE      Imaging: No results found.   Medications:    dextrose  100 mL/hr at 05/12/24 1134    busPIRone   5 mg Oral BID   Chlorhexidine  Gluconate Cloth  6 each Topical Daily   divalproex   500 mg Oral TID   Gerhardt's butt cream   Topical BID   insulin  aspart  0-24 Units Subcutaneous Q4H   pantoprazole   40 mg Oral QHS   QUEtiapine   25 mg Oral QHS   sertraline   200 mg Oral Daily   sodium chloride  flush  10-40 mL Intracatheter Q12H   tamsulosin   0.8 mg Oral QPC breakfast   acetaminophen  **OR** acetaminophen  (TYLENOL ) oral liquid 160 mg/5 mL **OR** acetaminophen , haloperidol  lactate, labetalol , senna-docusate  Assessment/ Plan:  78 y.o. male  with a PMHx of hypertension, hyperlipidemia, diabetes mellitus type 2, dementia, prior history of traumatic brain injury, who was admitted to [facility] on 05/03/2024 for evaluation of traumatic brain injury.  He was brought to the hospital by his wife as facial bruising was noted.   1.  Hypernatremia likely secondary to decreased free water intake. 2.  Acute kidney injury. 3.  Traumatic brain injury with bifrontal contusions, and subarachnoid hemorrhage.   Plan: Hyponatremia has improved.  Serum sodium down a bit to 155.  Renal function also improved.  Upon our evaluation the patient was off of D5.  We will restart D5W at 100 cc/h.  Target sodium by tomorrow is 145.  Continue to monitor serum sodium and renal parameters closely.  Patient's family updated.    LOS: 9 Zaiyah Sottile 12/14/202512:39 PM

## 2024-05-12 NOTE — Progress Notes (Signed)
 Neurosurgery visit note Patient seen and examined.  Was being mobilized by nursing and wife present at bedside. Na's improving and plan appears for transfer to floor with ongoing hypernatremia correction. Patient regarded and tracked appropriately, with prompting AO xname, third year and dont know to location, was not able to accurately identify his wife. CN appear grossly intact and MAEx4 purposeful spontaneous  AP: Neuro stable. Appreciate excellent ICU care and ongoing hypernatremia correction Neurosurgery will continue to follow  Belvie Dustin Felix MD Neurosurgery

## 2024-05-12 NOTE — Hospital Course (Addendum)
 Hospital course / significant events:    12/05: Admitted to [facility] with multifocal traumatic ICH and acute right parietal skull fracture. Neurosurgery consulted and recommended non-operative management.  12/07: Worsening mentation observed; repeat CT head showed mild progression of hemorrhage. Subcutaneous heparin  discontinued  12/09: Mental status improved; patient alert and following commands. EEG negative for seizures. Actively participating in physical therapy. 12/10: Developed new-onset Afib with RVR and agitation. Treated with IV lorazepam  2 mg for sedation and IV metoprolol  5 mg for rate control; heart rate controlled into the low 100s. 12/12: Severe hypernatremia (Na 159-161 mEq/L) and persistent hyperglycemia noted. Initiated IV D5W for sodium correction and IV insulin  drip for glucose control. Neurosurgery recommended q2h sodium monitoring and ICU-level care. Nephrology consulted. ICU consulted for close monitoring, electrolyte management, and insulin  infusion. 12/13: Titrating D5W infusion for slow correction of hypernatremia. DC insulin  gtt, transitioned to lispro SS 12/14: hospitalists assume care, per family pt more alert today. Restarting IV fluids d/t persistent hypernatremia, if he does not start taking more po will revisit GOC w/ that in mind 12/15: adjusted insulin  basal + SSI q4h (hypoglycemic event), remains hypernatremic - likely hyperglycemia causing osmotic diuresis worsening hypernatremia but D5W fluids only good option for sodium correction and is making Glc management a challenge.  12/16: remains more encephalopathic and more agitated today requiring mor prn meds to keep calm/peaceful. Discussion today w/ family - he is not improving and is unlikely to recover to what he would consider a meaningful QOL. Decision made to transition to comfort measures only     Consultants:  [facility] Neurosurgery Nephrology   Procedures/Surgeries: none      ASSESSMENT & PLAN:   Comfort  measures only  See orders - prn meds for symptoms pain, air hunger, agitation, secretions.  Frequent checks to ensure patient is comfortable and not in distress Morphine  currently prn, may escalate to drip if needed, contact hospitalist if concern he might benefit from this over the prn push dosing Discontinue labs Discontinue non-comfort medications  Occasional vital signs may guide prognosis / expectations but not reliably, may obtain VS if family requests  Consider for hospice placement if desired but I expect he will not remain stable for transport anywhere   Hospital problems addressed:  Metabolic Encephalopathy, multifactorial  Due to TBI, SDH/ICH  Due to dehydration, hypernatremia  Complicated by underlying dementia TBI, SDH/ICH  Hypernatremia  Dehydration question metabolic acidosis w/ compensatory respiratory alkalosis DM2 w/ hyperglycemia  Hypoglycemia d/t insulin  + poor po intake  Frontotemporal dementia  Afib with RVR - resolved  overweight based on BMI: Body mass index is 25.13 kg/m.

## 2024-05-12 NOTE — Progress Notes (Signed)
 PROGRESS NOTE    Dustin Ferrell   FMW:[id number] DOB: Aug 03, 1945  DOA: 05/03/2024 Date of Service: 05/12/2024 which is hospital day 9  PCP: [name], MD    Hospital course / significant events:    12/05: Admitted to [facility] with multifocal traumatic ICH and acute right parietal skull fracture. Neurosurgery consulted and recommended non-operative management.  12/07: Worsening mentation observed; repeat CT head showed mild progression of hemorrhage. Subcutaneous heparin  discontinued  12/09: Mental status improved; patient alert and following commands. EEG negative for seizures. Actively participating in physical therapy. 12/10: Developed new-onset Afib with RVR and agitation. Treated with IV lorazepam  2 mg for sedation and IV metoprolol  5 mg for rate control; heart rate controlled into the low 100s. 12/12: Severe hypernatremia (Na 159-161 mEq/L) and persistent hyperglycemia noted. Initiated IV D5W for sodium correction and IV insulin  drip for glucose control. Neurosurgery recommended q2h sodium monitoring and ICU-level care. Nephrology consulted. ICU consulted for close monitoring, electrolyte management, and insulin  infusion. 12/13: Sitter remains at bedside. Titrating D5W infusion for slow correction of hypernatremia. Concluded insulin  gtt, transitioned to lispro SS 12/14: hospitalists assume care, per family pt more alert today. Restarting IV fluids d/t persistent hypernatremia, if he does not start taking more po will revisit GOC w/ that in mind     Consultants:  [facility] Neurosurgery Nephrology   Procedures/Surgeries: none      ASSESSMENT & PLAN:   #Toxic Metabolic Encephalopathy #Severe Hypernatremia #Traumatic Brain Injury  #Brain Contusion #Subdural Hematoma  #Intracranial Hemorrhage #Frontotemporal Dementia #Afib with RVR  Neuro - admit with traumatic brain injury likely secondary to fall with subdural, subarachnoid, and intracranial hemorrhage as well as  brain contusion and skull fracture. Mental status worsened with hypernatremia overnight, requiring ICU admission.  --> He is improved though remains confused  --> neurosurgery consulted, no surgical interventions planned.  CV - hemodynamics have been maintained, but BP is trending down. He had an episode of Afib with RVR during his hospitalization that was treated with metoprolol . --> discontinued his PO anti-hypertensive agents.   Pulm - no concerns  --> on room air, aspiration precautions  GI - with with diet ordered, He was eating well and getting good nutrition prior to admission per family.Discontinued lactulose  (suspect hyperammonemia due to valproic  acid) --> SLP consult to evaluate swallowing.   --> dysphagia diet  --> poor candidate for feeding tube, if not start taking more po will revisit GOC w/ that in mind  Renal - Severe hypernatremia secondary to lactulose  and lack of access to free water. Large free water deficit --> plan for slow correction - no more than 10 mEq/24 hours, --> Check sodium every 4-6 hours,  --> continue D5W for now at 100 mL/hour. --> nephrology following   Endo - required insulin  gtt for hyperglycemia, was switched to ICU glycemic protocol with sliding scale, now off this --> monitor Glc   Hem/Onc -  --> SCD's, no chemical DVT prophylaxis given bleed  ID - no sign of active infection.  --> Aspiration precautions    overweight based on BMI: Body mass index is 25.13 kg/m.SABRA Significantly low or high BMI is associated with higher medical risk.  Underweight - under 18  overweight - 25 to 29 obese - 30 or more Class 1 obesity: BMI of 30.0 to 34 Class 2 obesity: BMI of 35.0 to 39 Class 3 obesity: BMI of 40.0 to 49 Super Morbid Obesity: BMI 50-59 Super-super Morbid Obesity: BMI 60+ Healthy nutrition and physical  activity advised as adjunct to other disease management and risk reduction treatments    DVT prophylaxis: SCD IV fluids: D5W continuous  IV fluids  Nutrition: dysphagia diet  Central lines / other devices: none  Code Status: DNR/DNI ACP documentation reviewed:  none on file in [facility]  TOC needs: TBD, expect HH/SNF Medical barriers to dispo: hypernatremia. Expected medical readiness for discharge few more days.              Subjective / Brief ROS:  Patient reports feel fine no comlaints  Family at bedside   Family Communication: family at bedside on rounds     Objective Findings:  Vitals:   05/12/24 1123 05/12/24 1200 05/12/24 1400 05/12/24 1552  BP: 138/72 (!) 153/118 113/62 137/87  Pulse: (!) 105 (!) 103 (!) 102 95  Resp: 19 (!) 22 (!) 29 17  Temp:    98.4 F (36.9 C)  TempSrc:      SpO2: 98% 98% 94% 97%  Weight:      Height:        Intake/Output Summary (Last 24 hours) at 05/12/2024 1733 Last data filed at 05/12/2024 1427 Gross per 24 hour  Intake 288.21 ml  Output 1125 ml  Net -836.79 ml   Filed Weights   05/10/24 0500 05/11/24 0500 05/12/24 0500  Weight: 67.6 kg 62.1 kg 68.5 kg    Examination:  Physical Exam Constitutional:      General: He is not in acute distress. Cardiovascular:     Rate and Rhythm: Normal rate and regular rhythm.  Pulmonary:     Effort: Pulmonary effort is normal.     Breath sounds: Normal breath sounds.  Neurological:     Mental Status: He is alert. Mental status is at baseline. He is disoriented.  Psychiatric:        Mood and Affect: Mood normal.        Behavior: Behavior normal.          Scheduled Medications:   busPIRone   5 mg Oral BID   Chlorhexidine  Gluconate Cloth  6 each Topical Daily   divalproex   500 mg Oral TID   Gerhardt's butt cream   Topical BID   insulin  aspart  0-24 Units Subcutaneous Q4H   pantoprazole   40 mg Oral QHS   QUEtiapine   25 mg Oral QHS   sertraline   200 mg Oral Daily   sodium chloride  flush  10-40 mL Intracatheter Q12H   tamsulosin   0.8 mg Oral QPC breakfast    Continuous Infusions:  dextrose  100 mL/hr at  05/12/24 1427    PRN Medications:  acetaminophen  **OR** acetaminophen  (TYLENOL ) oral liquid 160 mg/5 mL **OR** acetaminophen , haloperidol  lactate, labetalol , senna-docusate  Antimicrobials from admission:  Anti-infectives (From admission, onward)    None           Data Reviewed:  I have personally reviewed the following...  CBC: Recent Labs  Lab 05/06/24 0626 05/07/24 0807 05/09/24 0849 05/11/24 0346 05/12/24 0316  WBC 13.5* 12.6* 10.2 13.5* 12.0*  NEUTROABS  --  9.7*  --   --   --   HGB 15.5 14.0 12.8* 13.5 11.8*  HCT 44.1 42.9 39.5 42.8 37.0*  MCV 87.8 93.1 92.1 94.9 93.4  PLT 289 297 234 267 242   Basic Metabolic Panel: Recent Labs  Lab 05/09/24 0849 05/10/24 0909 05/10/24 1756 05/11/24 0154 05/11/24 0346 05/11/24 1824 05/11/24 2053 05/12/24 0112 05/12/24 0316 05/12/24 0909 05/12/24 1318  NA 152* 158*   < > 163*   < >  156* 155* 157* 156* 155* 157*  K 3.5 3.9  --  3.5  --  3.2*  --   --  3.7 3.6  --   CL 117* 122*  --  124*  --  116*  --   --  119* 121*  --   CO2 21* 19*  --  25  --  16*  --   --  19* 18*  --   GLUCOSE 247* 223*  --  89  --  411*  --   --  136* 237*  --   BUN 62* 63*  --  77*  --  90*  --   --  88* 81*  --   CREATININE 1.08 1.05  --  1.74*  --  1.87*  --   --  1.48* 1.34*  --   CALCIUM  9.5 9.5  --  9.9  --  8.8*  --   --  8.9 8.9  --   MG 3.3*  --   --   --   --   --   --   --   --  3.5*  --   PHOS 3.1  --   --   --   --   --   --   --   --   --   --    < > = values in this interval not displayed.   GFR: Estimated Creatinine Clearance: 38.2 mL/min (A) (by C-G formula based on SCr of 1.34 mg/dL (H)). Liver Function Tests: Recent Labs  Lab 05/06/24 0626 05/07/24 0807 05/09/24 0849  AST 38 44*  --   ALT 26 32  --   ALKPHOS 76 76  --   BILITOT 0.7 0.6  --   PROT 7.3 7.1  --   ALBUMIN 3.9 3.7 3.0*   No results for input(s): LIPASE, AMYLASE in the last 168 hours. Recent Labs  Lab 05/07/24 1155  AMMONIA 45*    Coagulation Profile: No results for input(s): INR, PROTIME in the last 168 hours. Cardiac Enzymes: No results for input(s): CKTOTAL, CKMB, CKMBINDEX, TROPONINI in the last 168 hours. BNP (last 3 results) No results for input(s): PROBNP in the last 8760 hours. HbA1C: No results for input(s): HGBA1C in the last 72 hours. CBG: Recent Labs  Lab 05/12/24 0033 05/12/24 0458 05/12/24 0737 05/12/24 1102 05/12/24 1703  GLUCAP 128* 159* 202* 260* 334*   Lipid Profile: No results for input(s): CHOL, HDL, LDLCALC, TRIG, CHOLHDL, LDLDIRECT in the last 72 hours. Thyroid Function Tests: Recent Labs    05/11/24 0346  TSH 0.334*  FREET4 0.89   Anemia Panel: No results for input(s): VITAMINB12, FOLATE, FERRITIN, TIBC, IRON, RETICCTPCT in the last 72 hours. Most Recent Urinalysis On File:     Component Value Date/Time   COLORURINE YELLOW (A) 05/11/2024 0256   APPEARANCEUR CLOUDY (A) 05/11/2024 0256   LABSPEC 1.020 05/11/2024 0256   PHURINE 5.0 05/11/2024 0256   GLUCOSEU >=500 (A) 05/11/2024 0256   HGBUR NEGATIVE 05/11/2024 0256   BILIRUBINUR NEGATIVE 05/11/2024 0256   KETONESUR NEGATIVE 05/11/2024 0256   PROTEINUR NEGATIVE 05/11/2024 0256   UROBILINOGEN 0.2 09/30/2014 2227   NITRITE NEGATIVE 05/11/2024 0256   LEUKOCYTESUR NEGATIVE 05/11/2024 0256   Sepsis Labs: @LABRCNTIP (procalcitonin:4,lacticidven:4) Microbiology: Recent Results (from the past 240 hours)  Resp panel by RT-PCR (RSV, Flu A&B, Covid) Anterior Nasal Swab     Status: None   Collection Time: 05/06/24  3:26 PM   Specimen: Anterior Nasal Swab  Result Value Ref Range Status   SARS Coronavirus 2 by RT PCR NEGATIVE NEGATIVE Final    Comment: (NOTE) SARS-CoV-2 target nucleic acids are NOT DETECTED.  The SARS-CoV-2 RNA is generally detectable in upper respiratory specimens during the acute phase of infection. The lowest concentration of SARS-CoV-2 viral copies this assay can  detect is 138 copies/mL. A negative result does not preclude SARS-Cov-2 infection and should not be used as the sole basis for treatment or other patient management decisions. A negative result may occur with  improper specimen collection/handling, submission of specimen other than nasopharyngeal swab, presence of viral mutation(s) within the areas targeted by this assay, and inadequate number of viral copies(<138 copies/mL). A negative result must be combined with clinical observations, patient history, and epidemiological information. The expected result is Negative.  Fact Sheet for Patients:  bloggercourse.com  Fact Sheet for Healthcare Providers:  seriousbroker.it  This test is no t yet approved or cleared by the [place]  FDA and  has been authorized for detection and/or diagnosis of SARS-CoV-2 by FDA under an Emergency Use Authorization (EUA). This EUA will remain  in effect (meaning this test can be used) for the duration of the COVID-19 declaration under Section 564(b)(1) of the Act, 21 U.S.C.section 360bbb-3(b)(1), unless the authorization is terminated  or revoked sooner.       Influenza A by PCR NEGATIVE NEGATIVE Final   Influenza B by PCR NEGATIVE NEGATIVE Final    Comment: (NOTE) The Xpert Xpress SARS-CoV-2/FLU/RSV plus assay is intended as an aid in the diagnosis of influenza from Nasopharyngeal swab specimens and should not be used as a sole basis for treatment. Nasal washings and aspirates are unacceptable for Xpert Xpress SARS-CoV-2/FLU/RSV testing.  Fact Sheet for Patients: bloggercourse.com  Fact Sheet for Healthcare Providers: seriousbroker.it  This test is not yet approved or cleared by the [place]  FDA and has been authorized for detection and/or diagnosis of SARS-CoV-2 by FDA under an Emergency Use Authorization (EUA). This EUA will remain in  effect (meaning this test can be used) for the duration of the COVID-19 declaration under Section 564(b)(1) of the Act, 21 U.S.C. section 360bbb-3(b)(1), unless the authorization is terminated or revoked.     Resp Syncytial Virus by PCR NEGATIVE NEGATIVE Final    Comment: (NOTE) Fact Sheet for Patients: bloggercourse.com  Fact Sheet for Healthcare Providers: seriousbroker.it  This test is not yet approved or cleared by the [place]  FDA and has been authorized for detection and/or diagnosis of SARS-CoV-2 by FDA under an Emergency Use Authorization (EUA). This EUA will remain in effect (meaning this test can be used) for the duration of the COVID-19 declaration under Section 564(b)(1) of the Act, 21 U.S.C. section 360bbb-3(b)(1), unless the authorization is terminated or revoked.  Performed at [facility], [address] [place], [place] [address]   Respiratory (~20 pathogens) panel by PCR     Status: None   Collection Time: 05/06/24  3:26 PM   Specimen: Nasopharyngeal Swab; Respiratory  Result Value Ref Range Status   Adenovirus NOT DETECTED NOT DETECTED Final   Coronavirus 229E NOT DETECTED NOT DETECTED Final    Comment: (NOTE) The Coronavirus on the Respiratory Panel, DOES NOT test for the novel  Coronavirus (2019 nCoV)    Coronavirus HKU1 NOT DETECTED NOT DETECTED Final   Coronavirus NL63 NOT DETECTED NOT DETECTED Final   Coronavirus OC43 NOT DETECTED NOT DETECTED Final   Metapneumovirus NOT DETECTED NOT DETECTED Final   Rhinovirus / Enterovirus NOT  DETECTED NOT DETECTED Final   Influenza A NOT DETECTED NOT DETECTED Final   Influenza B NOT DETECTED NOT DETECTED Final   Parainfluenza Virus 1 NOT DETECTED NOT DETECTED Final   Parainfluenza Virus 2 NOT DETECTED NOT DETECTED Final   Parainfluenza Virus 3 NOT DETECTED NOT DETECTED Final   Parainfluenza Virus 4 NOT DETECTED NOT DETECTED Final   Respiratory  Syncytial Virus NOT DETECTED NOT DETECTED Final   Bordetella pertussis NOT DETECTED NOT DETECTED Final   Bordetella Parapertussis NOT DETECTED NOT DETECTED Final   Chlamydophila pneumoniae NOT DETECTED NOT DETECTED Final   Mycoplasma pneumoniae NOT DETECTED NOT DETECTED Final    Comment: Performed at [facility], [address], [place], [place] [address]      Radiology Studies last 3 days: ECHOCARDIOGRAM COMPLETE Result Date: 05/09/2024    ECHOCARDIOGRAM REPORT   Patient Name:   KASPER MUDRICK Date of Exam: 05/08/2024 Medical Rec #:  [id number]        Height:       65.0 in Accession #:    [id number]       Weight:       151.9 lb Date of Birth:  09-08-1945        BSA:          1.760 m Patient Age:    78 years         BP:           123/59 mmHg Patient Gender: M                HR:           94 bpm. Exam Location:  [facility] Procedure: 2D Echo, Cardiac Doppler and Color Doppler (Both Spectral and Color            Flow Doppler were utilized during procedure). Indications:     I48.91 Atrial Fibrillation  History:         Patient has prior history of Echocardiogram examinations, most                  recent 10/09/2014. Risk Factors:Diabetes and Hypertension.  Sonographer:     Carl Coma RDCS Referring Phys:  [id number] ALEXANDRA DEZII Diagnosing Phys: Marsa Dooms MD IMPRESSIONS  1. Left ventricular ejection fraction, by estimation, is 50 to 55%. The left ventricle has low normal function. The left ventricle has no regional wall motion abnormalities. Left ventricular diastolic parameters are consistent with Grade I diastolic dysfunction (impaired relaxation).  2. Right ventricular systolic function is normal. The right ventricular size is normal.  3. The mitral valve is normal in structure. Mild mitral valve regurgitation. No evidence of mitral stenosis.  4. The aortic valve is normal in structure. Aortic valve regurgitation is not visualized. No aortic stenosis is present.  5. The inferior  vena cava is normal in size with greater than 50% respiratory variability, suggesting right atrial pressure of 3 mmHg. FINDINGS  Left Ventricle: Left ventricular ejection fraction, by estimation, is 50 to 55%. The left ventricle has low normal function. The left ventricle has no regional wall motion abnormalities. Strain was performed and the global longitudinal strain is indeterminate. The left ventricular internal cavity size was normal in size. There is no left ventricular hypertrophy. Left ventricular diastolic parameters are consistent with Grade I diastolic dysfunction (impaired relaxation). Right Ventricle: The right ventricular size is normal. No increase in right ventricular wall thickness. Right ventricular systolic function is normal. Left Atrium: Left atrial size was  normal in size. Right Atrium: Right atrial size was normal in size. Pericardium: There is no evidence of pericardial effusion. Mitral Valve: The mitral valve is normal in structure. Mild mitral valve regurgitation. No evidence of mitral valve stenosis. Tricuspid Valve: The tricuspid valve is normal in structure. Tricuspid valve regurgitation is mild . No evidence of tricuspid stenosis. Aortic Valve: The aortic valve is normal in structure. Aortic valve regurgitation is not visualized. No aortic stenosis is present. Pulmonic Valve: The pulmonic valve was normal in structure. Pulmonic valve regurgitation is not visualized. No evidence of pulmonic stenosis. Aorta: The aortic root is normal in size and structure. Venous: The inferior vena cava is normal in size with greater than 50% respiratory variability, suggesting right atrial pressure of 3 mmHg. IAS/Shunts: No atrial level shunt detected by color flow Doppler. Additional Comments: 3D was performed not requiring image post processing on an independent workstation and was indeterminate.  LEFT VENTRICLE PLAX 2D LVIDd:         3.30 cm   Diastology LVIDs:         2.40 cm   LV e' medial:    5.38  cm/s LV PW:         1.20 cm   LV E/e' medial:  8.9 LV IVS:        1.40 cm   LV e' lateral:   9.24 cm/s LVOT diam:     2.10 cm   LV E/e' lateral: 5.2 LV SV:         43 LV SV Index:   24 LVOT Area:     3.46 cm  RIGHT VENTRICLE             IVC RV Basal diam:  3.00 cm     IVC diam: 1.40 cm RV S prime:     10.63 cm/s TAPSE (M-mode): 1.5 cm LEFT ATRIUM             Index        RIGHT ATRIUM          Index LA diam:        4.00 cm 2.27 cm/m   RA Area:     7.66 cm LA Vol (A2C):   25.1 ml 14.26 ml/m  RA Volume:   15.30 ml 8.69 ml/m LA Vol (A4C):   33.8 ml 19.21 ml/m LA Biplane Vol: 30.0 ml 17.05 ml/m  AORTIC VALVE LVOT Vmax:   85.13 cm/s LVOT Vmean:  56.167 cm/s LVOT VTI:    0.124 m  AORTA Ao Root diam: 4.00 cm Ao Asc diam:  3.20 cm MITRAL VALVE MV Area (PHT): 4.14 cm    SHUNTS MV Decel Time: 183 msec    Systemic VTI:  0.12 m MV E velocity: 47.97 cm/s  Systemic Diam: 2.10 cm MV A velocity: 99.43 cm/s MV E/A ratio:  0.48 Marsa Dooms MD Electronically signed by Marsa Dooms MD Signature Date/Time: 05/09/2024/7:38:10 AM    Final    MR BRAIN WO CONTRAST Result Date: 05/09/2024 EXAM: MRI BRAIN WITHOUT CONTRAST 05/08/2024 10:59:04 PM TECHNIQUE: Multiplanar multisequence MRI of the head/brain was performed without the administration of intravenous contrast. COMPARISON: MR Head without contrast 08/17/2023. CT head 05/05/2024 and earlier. CLINICAL HISTORY: 78 year old male with history of intracranial hemorrhage and trauma. FINDINGS: BRAIN AND VENTRICLES: Asymmetric subdural hematomas, along the left convexity measuring 6 to 7 mm in thickness (series 12 image 37 and series 11 image 16) and along the right convexity measuring 2 mm in thickness. Mild rightward  midline shift of roughly 3 mm. Superimposed anterior bifrontal hemorrhagic contusions, right greater than left. Smaller acute hemorrhagic contusions in the anterior left temporal lobe (series 12 image 15 and series 14 image 22). Small volume of  intraventricular hemorrhage most apparent in the occipital horns (series 14 image 34). Scattered bilateral subarachnoid hemorrhage (series 14 image 62, series 12 image 41). Furthermore, small volume bilateral more symmetric posterior fossa subdural blood on series 12 image 11, 2 mm in thickness. Bifrontal cerebral edema. No significant mass effect on the frontal horns. Underlying chronic encephalomalacia in the anterior right frontal lobe. No ventriculomegaly, stable ventricle size and configuration. Effaced suprasellar cistern. Other basilar cisterns are stable from earlier this year. Blood related susceptibility artifact on DWI. No areas of diffusion restriction strongly suggestive of superimposed acute infarct. No mass. Normal flow voids. ORBITS: No acute abnormality. SINUSES AND MASTOIDS: Mild paranasal sinus and mastoid opacification has not significantly changed from the MRI earlier this year. BONES AND SOFT TISSUES: Normal marrow signal. Stable visible cervical spine degeneration including ligamentous hypertrophy about the odontoid. Small volume retained secretions in the pharynx. IMPRESSION: 1. Multifocal posttraumatic intracranial hemorrhage not significantly changed from 05/05/2024 CT head: Subdural hematomas - left convexity (6-7 mm), right convexity (2 mm), and bilateral posterior fossa measuring 2 mm. Right > left anterior frontal lobe and left anterior temporal lobe Hemorrhagic contusions. Scattered bilateral Subarachnoid hemorrhages. Small volume intraventricular hemorrhagic hemorrhage. 2. Effaced suprasellar cistern and mild rightward midline shift (3 mm). No other significant intracranial mass effect. Basilar cisterns are patent. 3. No ventriculomegaly. No superimposed acute infarct. No other complicating features. Electronically signed by: Helayne Hurst MD 05/09/2024 04:02 AM EST RP Workstation: [id number]         Laneta Blunt, DO [facility] 05/12/2024, 5:33 PM    Dictation  software may have been used to generate the above note. Typos may occur and escape review in typed/dictated notes. Please contact Dr Blunt directly for clarity if needed.  Staff may message me via secure chat in Epic  but this may not receive an immediate response,  please page me for urgent matters!  If 7PM-7AM, please contact night coverage [email or web address]

## 2024-05-13 ENCOUNTER — Inpatient Hospital Stay

## 2024-05-13 DIAGNOSIS — I629 Nontraumatic intracranial hemorrhage, unspecified: Secondary | ICD-10-CM | POA: Diagnosis not present

## 2024-05-13 LAB — COMPREHENSIVE METABOLIC PANEL WITH GFR
ALT: 80 U/L — ABNORMAL HIGH (ref 0–44)
AST: 55 U/L — ABNORMAL HIGH (ref 15–41)
Albumin: 2.8 g/dL — ABNORMAL LOW (ref 3.5–5.0)
Alkaline Phosphatase: 141 U/L — ABNORMAL HIGH (ref 38–126)
Anion gap: 15 (ref 5–15)
BUN: 51 mg/dL — ABNORMAL HIGH (ref 8–23)
CO2: 22 mmol/L (ref 22–32)
Calcium: 9.2 mg/dL (ref 8.9–10.3)
Chloride: 124 mmol/L — ABNORMAL HIGH (ref 98–111)
Creatinine, Ser: 1.37 mg/dL — ABNORMAL HIGH (ref 0.61–1.24)
GFR, Estimated: 52 mL/min — ABNORMAL LOW (ref >60)
Glucose, Bld: 286 mg/dL — ABNORMAL HIGH (ref 70–99)
Potassium: 4.1 mmol/L (ref 3.5–5.1)
Sodium: 160 mmol/L — ABNORMAL HIGH (ref 135–145)
Total Bilirubin: 0.4 mg/dL (ref 0.0–1.2)
Total Protein: 6.9 g/dL (ref 6.5–8.1)

## 2024-05-13 LAB — CBC
HCT: 44.1 % (ref 39.0–52.0)
Hemoglobin: 14.2 g/dL (ref 13.0–17.0)
MCH: 29.7 pg (ref 26.0–34.0)
MCHC: 32.2 g/dL (ref 30.0–36.0)
MCV: 92.3 fL (ref 80.0–100.0)
Platelets: 389 K/uL (ref 150–400)
RBC: 4.78 MIL/uL (ref 4.22–5.81)
RDW: 15.1 % (ref 11.5–15.5)
WBC: 15.8 K/uL — ABNORMAL HIGH (ref 4.0–10.5)
nRBC: 0.8 % — ABNORMAL HIGH (ref 0.0–0.2)

## 2024-05-13 LAB — BASIC METABOLIC PANEL WITH GFR
Anion gap: 16 — ABNORMAL HIGH (ref 5–15)
BUN: 49 mg/dL — ABNORMAL HIGH (ref 8–23)
CO2: 21 mmol/L — ABNORMAL LOW (ref 22–32)
Calcium: 9.5 mg/dL (ref 8.9–10.3)
Chloride: 123 mmol/L — ABNORMAL HIGH (ref 98–111)
Creatinine, Ser: 1.2 mg/dL (ref 0.61–1.24)
GFR, Estimated: >60 mL/min (ref >60)
Glucose, Bld: 301 mg/dL — ABNORMAL HIGH (ref 70–99)
Potassium: 4.7 mmol/L (ref 3.5–5.1)
Sodium: 161 mmol/L (ref 135–145)

## 2024-05-13 LAB — BLOOD GAS, ARTERIAL
Acid-Base Excess: 2.4 mmol/L — ABNORMAL HIGH (ref 0.0–2.0)
Bicarbonate: 22.1 mmol/L (ref 20.0–28.0)
FIO2: 0.21 %
O2 Saturation: 92.8 %
Patient temperature: 37
pCO2 arterial: 22 mmHg — ABNORMAL LOW (ref 32–48)
pH, Arterial: 7.61 (ref 7.35–7.45)
pO2, Arterial: 59 mmHg — ABNORMAL LOW (ref 83–108)

## 2024-05-13 LAB — SODIUM
Sodium: 160 mmol/L — ABNORMAL HIGH (ref 135–145)
Sodium: 165 mmol/L (ref 135–145)
Sodium: 160 mmol/L — ABNORMAL HIGH (ref 135–145)

## 2024-05-13 LAB — GLUCOSE, CAPILLARY
Glucose-Capillary: 149 mg/dL — ABNORMAL HIGH (ref 70–99)
Glucose-Capillary: 177 mg/dL — ABNORMAL HIGH (ref 70–99)
Glucose-Capillary: 235 mg/dL — ABNORMAL HIGH (ref 70–99)
Glucose-Capillary: 240 mg/dL — ABNORMAL HIGH (ref 70–99)
Glucose-Capillary: 247 mg/dL — ABNORMAL HIGH (ref 70–99)
Glucose-Capillary: 277 mg/dL — ABNORMAL HIGH (ref 70–99)
Glucose-Capillary: 281 mg/dL — ABNORMAL HIGH (ref 70–99)
Glucose-Capillary: 36 mg/dL — CL (ref 70–99)

## 2024-05-13 LAB — LACTIC ACID, PLASMA: Lactic Acid, Venous: 4.2 mmol/L (ref 0.5–1.9)

## 2024-05-13 MED ORDER — LORAZEPAM 2 MG/ML IJ SOLN
1.0000 mg | Freq: Four times a day (QID) | INTRAMUSCULAR | Status: DC | PRN
  Administered 2024-05-13 – 2024-05-14 (×3): 1 mg via INTRAVENOUS
  Filled 2024-05-13 (×3): qty 1

## 2024-05-13 MED ORDER — SODIUM CHLORIDE 0.9 % IV SOLN: 500.0000 mg | INTRAVENOUS | Status: DC

## 2024-05-13 MED ORDER — INSULIN ASPART 100 UNIT/ML IJ SOLN
0.0000 [IU] | INTRAMUSCULAR | Status: DC
  Administered 2024-05-13 (×3): 5 [IU] via SUBCUTANEOUS
  Administered 2024-05-13: 21:00:00 8 [IU] via SUBCUTANEOUS
  Administered 2024-05-14 (×3): 3 [IU] via SUBCUTANEOUS
  Filled 2024-05-13 (×2): qty 5
  Filled 2024-05-13 (×2): qty 3
  Filled 2024-05-13: qty 5
  Filled 2024-05-13: qty 3
  Filled 2024-05-13: qty 8

## 2024-05-13 MED ORDER — DEXTROSE 5 % IV BOLUS
1000.0000 mL | Freq: Once | INTRAVENOUS | Status: AC
  Administered 2024-05-13: 19:00:00 1000 mL via INTRAVENOUS

## 2024-05-13 MED ORDER — SERTRALINE HCL 50 MG PO TABS: 100.0000 mg | ORAL_TABLET | Freq: Every day | ORAL | Status: DC

## 2024-05-13 MED ORDER — PIPERACILLIN-TAZOBACTAM 3.375 G IVPB
3.3750 g | Freq: Three times a day (TID) | INTRAVENOUS | Status: DC
  Administered 2024-05-13 – 2024-05-14 (×3): 3.375 g via INTRAVENOUS
  Filled 2024-05-13 (×3): qty 50

## 2024-05-13 MED ORDER — INSULIN GLARGINE 100 UNIT/ML ~~LOC~~ SOLN: 10.0000 [IU] | Freq: Every day | SUBCUTANEOUS | Status: DC

## 2024-05-13 MED ORDER — MORPHINE SULFATE (PF) 2 MG/ML IV SOLN
2.0000 mg | INTRAVENOUS | Status: DC | PRN
  Administered 2024-05-14 – 2024-05-15 (×4): 2 mg via INTRAVENOUS
  Filled 2024-05-13 (×5): qty 1

## 2024-05-13 MED ORDER — PANTOPRAZOLE SODIUM 40 MG IV SOLR
40.0000 mg | Freq: Every day | INTRAVENOUS | Status: DC
  Administered 2024-05-13: 21:00:00 40 mg via INTRAVENOUS
  Filled 2024-05-13: qty 10

## 2024-05-13 MED ORDER — MORPHINE SULFATE (PF) 2 MG/ML IV SOLN
2.0000 mg | Freq: Once | INTRAVENOUS | Status: AC
  Administered 2024-05-13: 19:00:00 2 mg via INTRAVENOUS
  Filled 2024-05-13: qty 1

## 2024-05-13 MED ORDER — INSULIN GLARGINE 100 UNIT/ML ~~LOC~~ SOLN
10.0000 [IU] | Freq: Every day | SUBCUTANEOUS | Status: DC
  Filled 2024-05-13: qty 0.1

## 2024-05-13 MED ORDER — SODIUM CHLORIDE 0.9 % IV SOLN
2.0000 g | INTRAVENOUS | Status: DC
  Filled 2024-05-13: qty 20

## 2024-05-13 MED ORDER — DEXTROSE 50 % IV SOLN
INTRAVENOUS | Status: AC
  Filled 2024-05-13: qty 50

## 2024-05-13 MED ORDER — SODIUM CHLORIDE 0.9 % IV SOLN
100.0000 mg | Freq: Two times a day (BID) | INTRAVENOUS | Status: DC
  Administered 2024-05-13 – 2024-05-14 (×2): 100 mg via INTRAVENOUS
  Filled 2024-05-13 (×3): qty 100

## 2024-05-13 MED ORDER — VALPROATE SODIUM 100 MG/ML IV SOLN
500.0000 mg | Freq: Three times a day (TID) | INTRAVENOUS | Status: DC
  Administered 2024-05-13 – 2024-05-14 (×2): 500 mg via INTRAVENOUS
  Filled 2024-05-13 (×4): qty 5

## 2024-05-13 MED ORDER — DEXTROSE 50 % IV SOLN
1.0000 | Freq: Once | INTRAVENOUS | Status: AC
  Administered 2024-05-13: 04:00:00 50 mL via INTRAVENOUS

## 2024-05-13 MED ORDER — INSULIN GLARGINE 100 UNIT/ML ~~LOC~~ SOLN
10.0000 [IU] | Freq: Every day | SUBCUTANEOUS | Status: DC
  Administered 2024-05-13 – 2024-05-14 (×2): 10 [IU] via SUBCUTANEOUS
  Filled 2024-05-13 (×2): qty 0.1

## 2024-05-13 NOTE — Progress Notes (Signed)
 [facility]  ROUNDING NOTE   Subjective:   Earlier this a.m. serum sodium was worse at 165. Overnight patient developed significant hyperglycemia with glucose being as high as 459. Case discussed with Dr. Marsa. Insulin  adjustments made. Later this a.m. sodium rechecked and down to 161. Renal function improved with BUN of 49 and creatinine down to 1.2.  Objective:  Vital signs in last 24 hours:  Temp:  [98.1 F (36.7 C)-99 F (37.2 C)] 98.1 F (36.7 C) (12/15 0910) Pulse Rate:  [95-111] 111 (12/15 0910) Resp:  [17-29] 18 (12/15 0424) BP: (113-147)/(57-87) 139/57 (12/15 0910) SpO2:  [91 %-99 %] 91 % (12/15 0910) Weight:  [68.8 kg] 68.8 kg (12/15 0500)  Weight change: 0.3 kg Filed Weights   05/11/24 0500 05/12/24 0500 05/13/24 0500  Weight: 62.1 kg 68.5 kg 68.8 kg    Intake/Output: I/O last 3 completed shifts: In: 648.2 [P.O.:360; I.V.:288.2] Out: 4325 [Urine:4125; Stool:200]   Intake/Output this shift:  No intake/output data recorded.  Physical Exam: General: Agitated  Head: Facial bruising noted.  Dry oral mucosal membranes  Neck: Supple  Lungs:  Clear to auscultation, normal effort  Heart: S1S2 no rubs  Abdomen:  Soft, nontender, bowel sounds present  Extremities: No peripheral edema.  Neurologic: Arousable, agitated  Skin: No acute rash  Access: No hemodialysis access    Basic Metabolic Panel: Recent Labs  Lab 05/09/24 0849 05/10/24 0909 05/11/24 0154 05/11/24 0346 05/11/24 1824 05/11/24 2053 05/12/24 0316 05/12/24 0909 05/12/24 1318 05/12/24 1643 05/12/24 2140 05/13/24 0133 05/13/24 0450 05/13/24 1030  NA 152*   < > 163*   < > 156*   < > 156* 155*   < > 158* 157* 160* 165* 161*  K 3.5   < > 3.5  --  3.2*  --  3.7 3.6  --   --   --   --   --  4.7  CL 117*   < > 124*  --  116*  --  119* 121*  --   --   --   --   --  123*  CO2 21*   < > 25  --  16*  --  19* 18*  --   --   --   --   --  21*  GLUCOSE 247*   < > 89  --  411*  --   136* 237*  --   --   --   --   --  301*  BUN 62*   < > 77*  --  90*  --  88* 81*  --   --   --   --   --  49*  CREATININE 1.08   < > 1.74*  --  1.87*  --  1.48* 1.34*  --   --   --   --   --  1.20  CALCIUM  9.5   < > 9.9  --  8.8*  --  8.9 8.9  --   --   --   --   --  9.5  MG 3.3*  --   --   --   --   --   --  3.5*  --   --   --   --   --   --   PHOS 3.1  --   --   --   --   --   --   --   --   --   --   --   --   --    < > =  values in this interval not displayed.    Liver Function Tests: Recent Labs  Lab 05/07/24 0807 05/09/24 0849  AST 44*  --   ALT 32  --   ALKPHOS 76  --   BILITOT 0.6  --   PROT 7.1  --   ALBUMIN 3.7 3.0*   No results for input(s): LIPASE, AMYLASE in the last 168 hours. Recent Labs  Lab 05/07/24 1155  AMMONIA 45*    CBC: Recent Labs  Lab 05/07/24 0807 05/09/24 0849 05/11/24 0346 05/12/24 0316  WBC 12.6* 10.2 13.5* 12.0*  NEUTROABS 9.7*  --   --   --   HGB 14.0 12.8* 13.5 11.8*  HCT 42.9 39.5 42.8 37.0*  MCV 93.1 92.1 94.9 93.4  PLT 297 234 267 242    Cardiac Enzymes: No results for input(s): CKTOTAL, CKMB, CKMBINDEX, TROPONINI in the last 168 hours.  BNP: Invalid input(s): POCBNP  CBG: Recent Labs  Lab 05/13/24 0019 05/13/24 0415 05/13/24 0702 05/13/24 0905 05/13/24 1228  GLUCAP 240* 36* 149* 235* 277*    Microbiology: Results for orders placed or performed during the hospital encounter of 05/03/24  Resp panel by RT-PCR (RSV, Flu A&B, Covid) Anterior Nasal Swab     Status: None   Collection Time: 05/06/24  3:26 PM   Specimen: Anterior Nasal Swab  Result Value Ref Range Status   SARS Coronavirus 2 by RT PCR NEGATIVE NEGATIVE Final    Comment: (NOTE) SARS-CoV-2 target nucleic acids are NOT DETECTED.  The SARS-CoV-2 RNA is generally detectable in upper respiratory specimens during the acute phase of infection. The lowest concentration of SARS-CoV-2 viral copies this assay can detect is 138 copies/mL. A negative  result does not preclude SARS-Cov-2 infection and should not be used as the sole basis for treatment or other patient management decisions. A negative result may occur with  improper specimen collection/handling, submission of specimen other than nasopharyngeal swab, presence of viral mutation(s) within the areas targeted by this assay, and inadequate number of viral copies(<138 copies/mL). A negative result must be combined with clinical observations, patient history, and epidemiological information. The expected result is Negative.  Fact Sheet for Patients:  bloggercourse.com  Fact Sheet for Healthcare Providers:  seriousbroker.it  This test is no t yet approved or cleared by the [place]  FDA and  has been authorized for detection and/or diagnosis of SARS-CoV-2 by FDA under an Emergency Use Authorization (EUA). This EUA will remain  in effect (meaning this test can be used) for the duration of the COVID-19 declaration under Section 564(b)(1) of the Act, 21 U.S.C.section 360bbb-3(b)(1), unless the authorization is terminated  or revoked sooner.       Influenza A by PCR NEGATIVE NEGATIVE Final   Influenza B by PCR NEGATIVE NEGATIVE Final    Comment: (NOTE) The Xpert Xpress SARS-CoV-2/FLU/RSV plus assay is intended as an aid in the diagnosis of influenza from Nasopharyngeal swab specimens and should not be used as a sole basis for treatment. Nasal washings and aspirates are unacceptable for Xpert Xpress SARS-CoV-2/FLU/RSV testing.  Fact Sheet for Patients: bloggercourse.com  Fact Sheet for Healthcare Providers: seriousbroker.it  This test is not yet approved or cleared by the [place]  FDA and has been authorized for detection and/or diagnosis of SARS-CoV-2 by FDA under an Emergency Use Authorization (EUA). This EUA will remain in effect (meaning this test can be used)  for the duration of the COVID-19 declaration under Section 564(b)(1) of the Act, 21 U.S.C. section 360bbb-3(b)(1), unless the  authorization is terminated or revoked.     Resp Syncytial Virus by PCR NEGATIVE NEGATIVE Final    Comment: (NOTE) Fact Sheet for Patients: bloggercourse.com  Fact Sheet for Healthcare Providers: seriousbroker.it  This test is not yet approved or cleared by the [place]  FDA and has been authorized for detection and/or diagnosis of SARS-CoV-2 by FDA under an Emergency Use Authorization (EUA). This EUA will remain in effect (meaning this test can be used) for the duration of the COVID-19 declaration under Section 564(b)(1) of the Act, 21 U.S.C. section 360bbb-3(b)(1), unless the authorization is terminated or revoked.  Performed at [facility], [address] [place], [place] [address]   Respiratory (~20 pathogens) panel by PCR     Status: None   Collection Time: 05/06/24  3:26 PM   Specimen: Nasopharyngeal Swab; Respiratory  Result Value Ref Range Status   Adenovirus NOT DETECTED NOT DETECTED Final   Coronavirus 229E NOT DETECTED NOT DETECTED Final    Comment: (NOTE) The Coronavirus on the Respiratory Panel, DOES NOT test for the novel  Coronavirus (2019 nCoV)    Coronavirus HKU1 NOT DETECTED NOT DETECTED Final   Coronavirus NL63 NOT DETECTED NOT DETECTED Final   Coronavirus OC43 NOT DETECTED NOT DETECTED Final   Metapneumovirus NOT DETECTED NOT DETECTED Final   Rhinovirus / Enterovirus NOT DETECTED NOT DETECTED Final   Influenza A NOT DETECTED NOT DETECTED Final   Influenza B NOT DETECTED NOT DETECTED Final   Parainfluenza Virus 1 NOT DETECTED NOT DETECTED Final   Parainfluenza Virus 2 NOT DETECTED NOT DETECTED Final   Parainfluenza Virus 3 NOT DETECTED NOT DETECTED Final   Parainfluenza Virus 4 NOT DETECTED NOT DETECTED Final   Respiratory Syncytial Virus NOT DETECTED NOT DETECTED  Final   Bordetella pertussis NOT DETECTED NOT DETECTED Final   Bordetella Parapertussis NOT DETECTED NOT DETECTED Final   Chlamydophila pneumoniae NOT DETECTED NOT DETECTED Final   Mycoplasma pneumoniae NOT DETECTED NOT DETECTED Final    Comment: Performed at [facility], [address], [place], [place] [address]    Coagulation Studies: No results for input(s): LABPROT, INR in the last 72 hours.  Urinalysis: Recent Labs    05/11/24 0256  COLORURINE YELLOW*  LABSPEC 1.020  PHURINE 5.0  GLUCOSEU >=500*  HGBUR NEGATIVE  BILIRUBINUR NEGATIVE  KETONESUR NEGATIVE  PROTEINUR NEGATIVE  NITRITE NEGATIVE  LEUKOCYTESUR NEGATIVE      Imaging: No results found.   Medications:    dextrose  100 mL/hr at 05/13/24 0430    Chlorhexidine  Gluconate Cloth  6 each Topical Daily   dextrose        divalproex   500 mg Oral TID   Gerhardt's butt cream   Topical BID   insulin  aspart  0-15 Units Subcutaneous Q4H   insulin  glargine  10 Units Subcutaneous Daily   pantoprazole   40 mg Oral QHS   [START ON 05/14/2024] sertraline   100 mg Oral Daily   sodium chloride  flush  10-40 mL Intracatheter Q12H   tamsulosin   0.8 mg Oral QPC breakfast   acetaminophen  **OR** acetaminophen  (TYLENOL ) oral liquid 160 mg/5 mL **OR** acetaminophen , dextrose , labetalol , LORazepam , senna-docusate, ziprasidone   Assessment/ Plan:  78 y.o. male  with a PMHx of hypertension, hyperlipidemia, diabetes mellitus type 2, dementia, prior history of traumatic brain injury, who was admitted to [facility] on 05/03/2024 for evaluation of traumatic brain injury.  He was brought to the hospital by his wife as facial bruising was noted.   1.  Hypernatremia likely secondary  to decreased free water intake. 2.  Acute kidney injury. 3.  Traumatic brain injury with bifrontal contusions, and subarachnoid hemorrhage.   Plan: Serum sodium earlier this a.m. was higher at 165 likely due to osmotic diuresis from hyperglycemia.  In the  interim insulin  adjustments have been made.  Earlier when we saw the patient D5W was at a rate of 50 cc/h but staff to increase this back to 100 cc/h.  Hopefully glucose control can be optimized.  Case also discussed with Dr. Marsa.  BUN down to 49 with creatinine of 1.20 which is a good sign.  Further plan as per hospitalist.    LOS: 10 Rainna Nearhood 12/15/20251:17 PM

## 2024-05-13 NOTE — Progress Notes (Addendum)
 PROGRESS NOTE    Dustin Ferrell   FMW:[id number] DOB: 1946-04-18  DOA: 05/03/2024 Date of Service: 05/13/2024 which is hospital day 10  PCP: [name], MD    Hospital course / significant events:    12/05: Admitted to [facility] with multifocal traumatic ICH and acute right parietal skull fracture. Neurosurgery consulted and recommended non-operative management.  12/07: Worsening mentation observed; repeat CT head showed mild progression of hemorrhage. Subcutaneous heparin  discontinued  12/09: Mental status improved; patient alert and following commands. EEG negative for seizures. Actively participating in physical therapy. 12/10: Developed new-onset Afib with RVR and agitation. Treated with IV lorazepam  2 mg for sedation and IV metoprolol  5 mg for rate control; heart rate controlled into the low 100s. 12/12: Severe hypernatremia (Na 159-161 mEq/L) and persistent hyperglycemia noted. Initiated IV D5W for sodium correction and IV insulin  drip for glucose control. Neurosurgery recommended q2h sodium monitoring and ICU-level care. Nephrology consulted. ICU consulted for close monitoring, electrolyte management, and insulin  infusion. 12/13: Titrating D5W infusion for slow correction of hypernatremia. DC insulin  gtt, transitioned to lispro SS 12/14: hospitalists assume care, per family pt more alert today. Restarting IV fluids d/t persistent hypernatremia, if he does not start taking more po will revisit GOC w/ that in mind 12/15: adjusted insulin  basal + SSI q4h (hypoglycemic event), remains hypernatremic - likely hyperglycemia causing osmotic diuresis worsening hypernatremia but D5W fluids only good option for sodium correction and is making Glc management a challenge.     Consultants:  [facility] Neurosurgery Nephrology   Procedures/Surgeries: none      ASSESSMENT & PLAN:   Metabolic Encephalopathy, multifactorial  Due to TBI, SDH/ICH  Due to dehydration, hypernatremia   Complicated by underlying dementia  Treat underlying cause(s) as below  Fall and delirium precautions   TBI, SDH/ICH  neurosurgery consulted, no surgical interventions planned.  Hypernatremia  Dehydration question metabolic acidosis w/ compensatory respiratory alkalosis Sodium up w/ concern for osmotic diuresis d/t hyperglycemia  Check sodium + glc every 4 hours continue D5W for now  nephrology following   DM2 w/ hyperglycemia  Hypoglycemia d/t insulin  + poor po intake  hyperglycemia causing osmotic diuresis and complicating hypernatremia treatment with D5W  monitor Glc  adjusting insulin  - added glargine 10 units daily, q4h sliding scale novolog    Frontotemporal dementia  SLP consult to evaluate swallowing.   dysphagia diet  poor candidate for feeding tube, if not start taking more po will revisit GOC w/ that in mind  Afib with RVR - resolved  Holding antihypertensives    overweight based on BMI: Body mass index is 25.13 kg/m.SABRA Significantly low or high BMI is associated with higher medical risk.  Underweight - under 18  overweight - 25 to 29 obese - 30 or more Class 1 obesity: BMI of 30.0 to 34 Class 2 obesity: BMI of 35.0 to 39 Class 3 obesity: BMI of 40.0 to 49 Super Morbid Obesity: BMI 50-59 Super-super Morbid Obesity: BMI 60+ Healthy nutrition and physical activity advised as adjunct to other disease management and risk reduction treatments    DVT prophylaxis: SCD IV fluids: D5W continuous IV fluids  Nutrition: dysphagia diet  Central lines / other devices: none  Code Status: DNR/DNI ACP documentation reviewed:  none on file in [facility]  TOC needs: TBD, expect HH/SNF Medical barriers to dispo: hypernatremia, labile glc. Expected medical readiness for discharge few more days.       Subjective / Brief ROS:  Patient more confused today not contributory  Family at bedside - report he seems more agitated today     Family Communication: family at bedside  on rounds (wife and daughter) and I called his other daughter Haroldine 05/13/2024 4:21 PM       Objective Findings:  Vitals:   05/13/24 0020 05/13/24 0424 05/13/24 0500 05/13/24 0910  BP: 126/65 (!) 147/76  (!) 139/57  Pulse: 95 100  (!) 111  Resp: 17 18    Temp: 98.3 F (36.8 C) 99 F (37.2 C)  98.1 F (36.7 C)  TempSrc: Axillary Axillary    SpO2: 98% 99%  91%  Weight:   68.8 kg   Height:        Intake/Output Summary (Last 24 hours) at 05/13/2024 1621 Last data filed at 05/13/2024 0425 Gross per 24 hour  Intake 360 ml  Output 3200 ml  Net -2840 ml   Filed Weights   05/11/24 0500 05/12/24 0500 05/13/24 0500  Weight: 62.1 kg 68.5 kg 68.8 kg    Examination:  Physical Exam Constitutional:      General: He is not in acute distress. Cardiovascular:     Rate and Rhythm: Normal rate and regular rhythm.  Pulmonary:     Effort: Pulmonary effort is normal.     Breath sounds: Normal breath sounds.  Neurological:     Mental Status: He is alert. Mental status is at baseline. He is disoriented.  Psychiatric:        Mood and Affect: Mood normal.        Behavior: Behavior normal.          Scheduled Medications:   Chlorhexidine  Gluconate Cloth  6 each Topical Daily   dextrose        divalproex   500 mg Oral TID   Gerhardt's butt cream   Topical BID   insulin  aspart  0-15 Units Subcutaneous Q4H   insulin  glargine  10 Units Subcutaneous Daily   pantoprazole   40 mg Oral QHS   [START ON 05/14/2024] sertraline   100 mg Oral Daily   sodium chloride  flush  10-40 mL Intracatheter Q12H   tamsulosin   0.8 mg Oral QPC breakfast    Continuous Infusions:  dextrose  100 mL/hr at 05/13/24 0430    PRN Medications:  acetaminophen  **OR** acetaminophen  (TYLENOL ) oral liquid 160 mg/5 mL **OR** acetaminophen , dextrose , labetalol , LORazepam , senna-docusate, ziprasidone   Antimicrobials from admission:  Anti-infectives (From admission, onward)    None           Data Reviewed:   I have personally reviewed the following...  CBC: Recent Labs  Lab 05/07/24 0807 05/09/24 0849 05/11/24 0346 05/12/24 0316  WBC 12.6* 10.2 13.5* 12.0*  NEUTROABS 9.7*  --   --   --   HGB 14.0 12.8* 13.5 11.8*  HCT 42.9 39.5 42.8 37.0*  MCV 93.1 92.1 94.9 93.4  PLT 297 234 267 242   Basic Metabolic Panel: Recent Labs  Lab 05/09/24 0849 05/10/24 0909 05/11/24 0154 05/11/24 0346 05/11/24 1824 05/11/24 2053 05/12/24 0316 05/12/24 0909 05/12/24 1318 05/12/24 2140 05/13/24 0133 05/13/24 0450 05/13/24 1030 05/13/24 1311  NA 152*   < > 163*   < > 156*   < > 156* 155*   < > 157* 160* 165* 161* 160*  K 3.5   < > 3.5  --  3.2*  --  3.7 3.6  --   --   --   --  4.7  --   CL 117*   < > 124*  --  116*  --  119* 121*  --   --   --   --  123*  --   CO2 21*   < > 25  --  16*  --  19* 18*  --   --   --   --  21*  --   GLUCOSE 247*   < > 89  --  411*  --  136* 237*  --   --   --   --  301*  --   BUN 62*   < > 77*  --  90*  --  88* 81*  --   --   --   --  49*  --   CREATININE 1.08   < > 1.74*  --  1.87*  --  1.48* 1.34*  --   --   --   --  1.20  --   CALCIUM  9.5   < > 9.9  --  8.8*  --  8.9 8.9  --   --   --   --  9.5  --   MG 3.3*  --   --   --   --   --   --  3.5*  --   --   --   --   --   --   PHOS 3.1  --   --   --   --   --   --   --   --   --   --   --   --   --    < > = values in this interval not displayed.   GFR: Estimated Creatinine Clearance: 42.7 mL/min (by C-G formula based on SCr of 1.2 mg/dL). Liver Function Tests: Recent Labs  Lab 05/07/24 0807 05/09/24 0849  AST 44*  --   ALT 32  --   ALKPHOS 76  --   BILITOT 0.6  --   PROT 7.1  --   ALBUMIN 3.7 3.0*   No results for input(s): LIPASE, AMYLASE in the last 168 hours. Recent Labs  Lab 05/07/24 1155  AMMONIA 45*   Coagulation Profile: No results for input(s): INR, PROTIME in the last 168 hours. Cardiac Enzymes: No results for input(s): CKTOTAL, CKMB, CKMBINDEX, TROPONINI in the last 168  hours. BNP (last 3 results) No results for input(s): PROBNP in the last 8760 hours. HbA1C: No results for input(s): HGBA1C in the last 72 hours. CBG: Recent Labs  Lab 05/13/24 0019 05/13/24 0415 05/13/24 0702 05/13/24 0905 05/13/24 1228  GLUCAP 240* 36* 149* 235* 277*   Lipid Profile: No results for input(s): CHOL, HDL, LDLCALC, TRIG, CHOLHDL, LDLDIRECT in the last 72 hours. Thyroid Function Tests: Recent Labs    05/11/24 0346  TSH 0.334*  FREET4 0.89   Anemia Panel: No results for input(s): VITAMINB12, FOLATE, FERRITIN, TIBC, IRON, RETICCTPCT in the last 72 hours. Most Recent Urinalysis On File:     Component Value Date/Time   COLORURINE YELLOW (A) 05/11/2024 0256   APPEARANCEUR CLOUDY (A) 05/11/2024 0256   LABSPEC 1.020 05/11/2024 0256   PHURINE 5.0 05/11/2024 0256   GLUCOSEU >=500 (A) 05/11/2024 0256   HGBUR NEGATIVE 05/11/2024 0256   BILIRUBINUR NEGATIVE 05/11/2024 0256   KETONESUR NEGATIVE 05/11/2024 0256   PROTEINUR NEGATIVE 05/11/2024 0256   UROBILINOGEN 0.2 09/30/2014 2227   NITRITE NEGATIVE 05/11/2024 0256   LEUKOCYTESUR NEGATIVE 05/11/2024 0256   Sepsis Labs: @LABRCNTIP (procalcitonin:4,lacticidven:4) Microbiology: Recent Results (from the past 240 hours)  Resp panel by RT-PCR (RSV, Flu A&B,  Covid) Anterior Nasal Swab     Status: None   Collection Time: 05/06/24  3:26 PM   Specimen: Anterior Nasal Swab  Result Value Ref Range Status   SARS Coronavirus 2 by RT PCR NEGATIVE NEGATIVE Final    Comment: (NOTE) SARS-CoV-2 target nucleic acids are NOT DETECTED.  The SARS-CoV-2 RNA is generally detectable in upper respiratory specimens during the acute phase of infection. The lowest concentration of SARS-CoV-2 viral copies this assay can detect is 138 copies/mL. A negative result does not preclude SARS-Cov-2 infection and should not be used as the sole basis for treatment or other patient management decisions. A negative result  may occur with  improper specimen collection/handling, submission of specimen other than nasopharyngeal swab, presence of viral mutation(s) within the areas targeted by this assay, and inadequate number of viral copies(<138 copies/mL). A negative result must be combined with clinical observations, patient history, and epidemiological information. The expected result is Negative.  Fact Sheet for Patients:  bloggercourse.com  Fact Sheet for Healthcare Providers:  seriousbroker.it  This test is no t yet approved or cleared by the [place]  FDA and  has been authorized for detection and/or diagnosis of SARS-CoV-2 by FDA under an Emergency Use Authorization (EUA). This EUA will remain  in effect (meaning this test can be used) for the duration of the COVID-19 declaration under Section 564(b)(1) of the Act, 21 U.S.C.section 360bbb-3(b)(1), unless the authorization is terminated  or revoked sooner.       Influenza A by PCR NEGATIVE NEGATIVE Final   Influenza B by PCR NEGATIVE NEGATIVE Final    Comment: (NOTE) The Xpert Xpress SARS-CoV-2/FLU/RSV plus assay is intended as an aid in the diagnosis of influenza from Nasopharyngeal swab specimens and should not be used as a sole basis for treatment. Nasal washings and aspirates are unacceptable for Xpert Xpress SARS-CoV-2/FLU/RSV testing.  Fact Sheet for Patients: bloggercourse.com  Fact Sheet for Healthcare Providers: seriousbroker.it  This test is not yet approved or cleared by the [place]  FDA and has been authorized for detection and/or diagnosis of SARS-CoV-2 by FDA under an Emergency Use Authorization (EUA). This EUA will remain in effect (meaning this test can be used) for the duration of the COVID-19 declaration under Section 564(b)(1) of the Act, 21 U.S.C. section 360bbb-3(b)(1), unless the authorization is terminated  or revoked.     Resp Syncytial Virus by PCR NEGATIVE NEGATIVE Final    Comment: (NOTE) Fact Sheet for Patients: bloggercourse.com  Fact Sheet for Healthcare Providers: seriousbroker.it  This test is not yet approved or cleared by the [place]  FDA and has been authorized for detection and/or diagnosis of SARS-CoV-2 by FDA under an Emergency Use Authorization (EUA). This EUA will remain in effect (meaning this test can be used) for the duration of the COVID-19 declaration under Section 564(b)(1) of the Act, 21 U.S.C. section 360bbb-3(b)(1), unless the authorization is terminated or revoked.  Performed at [facility], [address] [place], [place] [address]   Respiratory (~20 pathogens) panel by PCR     Status: None   Collection Time: 05/06/24  3:26 PM   Specimen: Nasopharyngeal Swab; Respiratory  Result Value Ref Range Status   Adenovirus NOT DETECTED NOT DETECTED Final   Coronavirus 229E NOT DETECTED NOT DETECTED Final    Comment: (NOTE) The Coronavirus on the Respiratory Panel, DOES NOT test for the novel  Coronavirus (2019 nCoV)    Coronavirus HKU1 NOT DETECTED NOT DETECTED Final   Coronavirus NL63 NOT DETECTED NOT  DETECTED Final   Coronavirus OC43 NOT DETECTED NOT DETECTED Final   Metapneumovirus NOT DETECTED NOT DETECTED Final   Rhinovirus / Enterovirus NOT DETECTED NOT DETECTED Final   Influenza A NOT DETECTED NOT DETECTED Final   Influenza B NOT DETECTED NOT DETECTED Final   Parainfluenza Virus 1 NOT DETECTED NOT DETECTED Final   Parainfluenza Virus 2 NOT DETECTED NOT DETECTED Final   Parainfluenza Virus 3 NOT DETECTED NOT DETECTED Final   Parainfluenza Virus 4 NOT DETECTED NOT DETECTED Final   Respiratory Syncytial Virus NOT DETECTED NOT DETECTED Final   Bordetella pertussis NOT DETECTED NOT DETECTED Final   Bordetella Parapertussis NOT DETECTED NOT DETECTED Final   Chlamydophila pneumoniae NOT  DETECTED NOT DETECTED Final   Mycoplasma pneumoniae NOT DETECTED NOT DETECTED Final    Comment: Performed at [facility], [address] [place], [place] [address]      Radiology Studies last 3 days: No results found.        Grae Cannata, DO [facility] 05/13/2024, 4:21 PM    Dictation software may have been used to generate the above note. Typos may occur and escape review in typed/dictated notes. Please contact Dr Marsa directly for clarity if needed.  Staff may message me via secure chat in Epic  but this may not receive an immediate response,  please page me for urgent matters!  If 7PM-7AM, please contact night coverage [email or web address]

## 2024-05-13 NOTE — Progress Notes (Signed)
 OT Cancellation Note  Patient Details Name: EARNIE BECHARD MRN: [id number] DOB: 1946-03-09   Cancelled Treatment:    Reason Eval/Treat Not Completed: Patient's level of consciousness. Chart reviewed, pt with Na critically high at 160, recently received medication for agitation. Will hold and continue to follow POC as pt appropriate.   Elston Slot, M.S. OTR/L  05/13/2024, 3:02 PM  ascom [phone]

## 2024-05-13 NOTE — Inpatient Diabetes Management (Signed)
 Inpatient Diabetes Program Recommendations  AACE/ADA: New Consensus Statement on Inpatient Glycemic Control   Target Ranges:  Prepandial:   less than 140 mg/dL      Peak postprandial:   less than 180 mg/dL (1-2 hours)      Critically ill patients:  140 - 180 mg/dL    Latest Reference Range & Units 05/13/24 00:19 05/13/24 04:15 05/13/24 07:02  Glucose-Capillary 70 - 99 mg/dL 759 (H)  Novolog  15 units @00 :02 36 (LL) 149 (H)    Latest Reference Range & Units 05/12/24 04:58 05/12/24 07:37 05/12/24 11:02 05/12/24 17:03 05/12/24 20:22 05/12/24 21:20 05/12/24 22:22 05/12/24 23:21  Glucose-Capillary 70 - 99 mg/dL 840 (H)  Novolog  2 units 202 (H)  Novolog  8 units 260 (H)  Novolog  12 units 334 (H)  Novolog  16 units 459 (H) 397 (H)  Novolog  15 units @21 :26 390 (H)  Novolog  15 units @22 :42 310 (H)   Review of Glycemic Control  Diabetes history: DM2 Outpatient Diabetes medications: Tresiba  23 units daily, Januvia  100 mg daily Current orders for Inpatient glycemic control: Novolog  0-20 units Q4H; D5@100  ml/hr   Inpatient Diabetes Program Recommendations:     Insulin : Patient received a total of Novolog  45 units from 21:26 on 05/12/24 to 00:02 on 05/13/24 which lead to CBG of 36 mg/dl at 5:84 am today due to insulin  stacking. Novolog  insulin  should not be given any closer than 4 hours apart.  Please consider ordering Lantus  to 10 units daily and decreasing Novolog  correction scale to 0-15 units Q4H.  Thanks, Earnie Gainer, RN, MSN, CDCES Diabetes Coordinator Inpatient Diabetes Program [phone] (Team Pager from 8am to 5pm)

## 2024-05-13 NOTE — Progress Notes (Signed)
 CRITICAL VALUE STICKER  CRITICAL VALUE: Sodium 161  RECEIVER (on-site recipient of call): Lauraine Ponto  DATE & TIME NOTIFIED: 05/13/24 1132  MESSENGER (representative from lab): Lorn  MD NOTIFIED: Marsa  TIME OF NOTIFICATION: 1134  RESPONSE: See plan of care

## 2024-05-13 NOTE — Progress Notes (Signed)
 Speech Language Pathology Treatment: Dysphagia  Patient Details Name: Dustin Ferrell MRN: [id number] DOB: 30-Nov-1945 Today's Date: 05/13/2024 Time: 0900-0920 SLP Time Calculation (min) (ACUTE ONLY): 20 min  Assessment / Plan / Recommendation Clinical Impression  Pt seen for continued dysphagia intervention. Pt on room air, resting upon therapist approach. No recent chest imaging and pt remains afebrile. Per chart review, pt's spouse indicating good-fair intake since diet initiated. Pt with eyes closed for entirety of session, lethargic. Persistent left lean noted, despite therapist repositioning x2. Min response to tactile cues (preset nation of spoon, completion of oral care). Limited oral manipulation for ice chip and insufficient labial pull for trial of thin liquid via straw. Pt with best oral acceptance with tsp of nectar thick liquids and puree. Prolonged oral manipulation, with eventual oral clearance for puree solids. No s/sx of aspiration across trials.   Given current level of alertness, cognitive status, dependency with feeding, and deconditioning, pt is at increased risk for aspiration. CLOSE monitoring of sufficient alertness for intake. Aspiration precautions remain (slow rate, small bites, elevated HOB, and alert for PO intake). NO straws. Continue with nectar thick liquids and puree solids. RN and physician aware of recommendations. SLP will continue to follow.     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 The patient denied trauma but has poor memory at baseline per family. Severe hypernatremia noted on 12/12 prompting transition to ICU level care. MRI:  Multifocal posttraumatic intracranial hemorrhage not significantly  changed  from 05/05/2024 CT head: Subdural hematomas - left convexity (6-7 mm), right  convexity (2 mm), and bilateral posterior fossa measuring 2 mm. Right > left  anterior frontal lobe and left anterior temporal lobe Hemorrhagic contusions.  Scattered bilateral Subarachnoid hemorrhages. Small volume intraventricular  hemorrhagic hemorrhage.  2. Effaced suprasellar cistern and mild rightward midline shift (3 mm). No  other significant intracranial mass effect. Basilar cisterns are patent. CXR 05/03/24: No acute cardiopulmonary process.      SLP Plan  Continue with current plan of care        Swallow Evaluation Recommendations   Recommendations: PO diet PO Diet Recommendation: Dysphagia 1 (Pureed);Mildly thick liquids (Level 2, nectar thick) Liquid Administration via: Spoon;Cup;No straw Medication Administration: Crushed with puree Supervision: Full assist for feeding;Full supervision/cueing for swallowing strategies Postural changes: Stay upright 30-60 min after meals;Position pt fully upright for meals Oral care recommendations: Oral care QID (4x/day);Staff/trained caregiver to provide oral care     Recommendations                     Oral care QID;Staff/trained caregiver to provide oral care   Frequent or constant Supervision/Assistance Dysphagia, unspecified (R13.10) (suspect related to acute deconditioning, AMS, baseline cognitive status, and current medical compromise)     Continue with current plan of care    Dustin Colegrove Clapp, MS, CCC-SLP Speech Language Pathologist Rehab Services; [facility] [phone] (ascom)   Dustin Ferrell  05/13/2024, 11:28 AM

## 2024-05-13 NOTE — Progress Notes (Signed)
 Palliative Care Progress Note, Assessment & Plan   Patient Name: Dustin Ferrell       Date: 05/13/2024 DOB: 1946-04-10  Age: 78 y.o. MRN#: [id number] Attending Physician: Marsa Edelman, DO Primary Care Physician: [name], MD Admit Date: 05/03/2024  Subjective: Unable to assess  HPI: 78 y.o. 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   CT head revealing areas of hemorrhagic contusions, SDH and subarachnoid hemorrhage--neurosurgery consulted--no surgical interventions CT facial revealing parietal skull fracture   Hospital course has been complicated by development of A-fib with RVR and agitation as well as development of significant hypernatremia--slowly improving   12/14 sodium improved to 157, renal function improving   Palliative medicine was consulted for assisting with goals of care conversations  Summary of counseling/coordination of care: Extensive chart review completed prior to meeting patient including labs, vital signs, imaging, progress notes, orders, and available advanced directive documents from current and previous encounters.   After reviewing the patient's chart I assessed patient at bedside.      Latest Ref Rng & Units 05/12/2024    3:16 AM 05/11/2024    3:46 AM 05/09/2024    8:49 AM  CBC  WBC 4.0 - 10.5 K/uL 12.0  13.5  10.2   Hemoglobin 13.0 - 17.0 g/dL 88.1  86.4  87.1   Hematocrit 39.0 - 52.0 % 37.0  42.8  39.5   Platelets 150 - 400 K/uL 242  267  234       Latest Ref Rng & Units 05/13/2024    1:11 PM 05/13/2024   10:30 AM 05/13/2024    4:50 AM  CMP  Glucose 70 - 99 mg/dL  698     BUN 8 - 23 mg/dL  49    Creatinine 9.38 - 1.24 mg/dL  8.79    Sodium 864 - 854 mmol/L 160  161  165   Potassium 3.5 - 5.1 mmol/L  4.7    Chloride 98 - 111 mmol/L  123    CO2 22 - 32 mmol/L  21    Calcium  8.9 - 10.3 mg/dL  9.5      Elderly, ill-appearing male lying in bed receiving care from NT. He does not open eyes or respond to verbal or tactile stimuli. He appears agitated. No family or visitors at bedside. Respirations are unlabored. He is in no distress. NT at bedside states patient did not eat breakfast today.  Remains hypernatremic today 165-->161-->160.  SLP re-evaluated today and recommends pureed diet with nectar thick liquids.   PMT will continue to follow for ongoing needs and support.   Physical Exam Vitals reviewed.  Constitutional:      General: He is not in acute distress.    Appearance: He is ill-appearing.  Pulmonary:     Comments: Increased WOB Skin:    General: Skin is warm and dry.     Comments: Bilateral safety mitts in place  Psychiatric:        Behavior: Behavior is agitated.   Recommendations/Plan: DNR/DNI  Allow time for  outcomes Correction of hypernatremia PMT will continue to follow          Total Time 80 minutes   Time spent includes: Detailed review of medical records (labs, imaging, vital signs), medically appropriate exam (mental status, respiratory, cardiac, skin), discussed with treatment team, counseling and educating patient, family and staff, documenting clinical information, medication management and coordination of care.  Addendum 1740: Returned to bedside to see if family is present. Olam (daughter) shares that she had just requested palliative care as her and her mother are concerned about acute onset of increased WOB. Patient transferred during visit for stat CT Head.   Olam expresses concern that her father is suffering and how to treat him. Discussed with daughter, wife and Barnie (daughter) via phone if CT is worse, with no  options for treatment, they could consider comfort care.   I explained comfort care as care where the patient would no longer receive aggressive medical interventions such as continuous vital signs, lab work, radiology testing, or medications not focused on comfort, peace, and dignity. This includes stopping antibiotics and weaning oxygen to room air, as these are generally not accepted as providing comfort but only prolonging the dying process artificially.   Olam and Barnie agree they do not want anything artificial as that was their father's wish. They are concerned that their mother will have a more difficult time transitioning to comfort because she feels like she is giving up on him.  At this time, family is waiting for CT head results so they will know how to proceed.   Discussed this plan of care with Dr. Marsa via Epic chat.   PMT will continue to follow for needs.    Devere Sacks, ELNITA- [facility] Palliative Medicine Team  05/13/2024 11:27 AM  Office [phone]  Pager [phone]

## 2024-05-13 NOTE — Progress Notes (Incomplete)
 Overnight   NAME: Dustin Ferrell MRN: [id number] DOB : 08-06-1945    Date of Service   05/13/2024   HPI/Events of Note   HPI: 78 year old male with past medical history of HTN, HLD, T2 DM, prior TBI with residual cognitive impairment and superimposed dementia, who presented on 05/03/2024.  Family reported facial bruising following an episode of vomiting and upper respiratory symptoms.  Patient declined trauma but has a poor memory at baseline per family.  Initial labs showed no leukocytosis, mild anemia at baseline, BUN elevation, hyperglycemia.  Troponin was mildly elevated but downtrended on repeated testing CT image of the head revealed multifocal traumatic intracranial hemorrhage, including bilateral frontal hemorrhagic contusions with associated subarachnoid hemorrhage, intraventricular hemorrhage, and hemorrhage involving the olfactory groove.  CT maxillofacial imaging demonstrated acute right parietal skull fracture neurosurgery was consulted in the emergency department and recommended a nonoperative management due to high surgical risk.  Patient was then admitted to medicine service for close neurological monitoring.  12/7: Worsening mentation observed repeat CT showed mild progression of hemorrhage subcu heparin  discontinued.  12/9: Mental status improved patient alert and following commands EEG negative for seizures actively participating in physical therapy  12/10: Developed new onset of A-fib with RVR and agitation treated with IV lorazepam  2 mg for sedation and IV metoprolol  5 mg for rate control.  12/12: Severe hypernatremia (sodium 159-161) and persistent hyperglycemia noted initiated IV D5W for sodium correction and IV insulin  drip for glucose control.  Neurosurgery recommended every 2 hour sodium monitoring and ICU level care nephrology consulted  12/15: Adjusted insulin  basal plus SSI every 4 patient remains hypernatremic likely hyperglycemic causing osmotic diuresis  worsening hypernatremia.  Patient received Geodon  at 1335 for agitation.  Patient found to have respiratory alkalosis, with metabolic acidosis due to dehydration.  Elevated lactic acid at 2.4.   Overnight: Notified by day team that patient was obtunded and labs had been in process.  CT head no new intracranial changes.  Chest x-ray concerning for developing patchy opacities at the left lung base suspicious for aspiration versus infection.  0000: Physical Exam Vitals and nursing note reviewed.  HENT:     Head: Raccoon eyes present.     Mouth/Throat:     Mouth: Mucous membranes are dry.     Dentition: Abnormal dentition.  Eyes:     Pupils: Pupils are equal, round, and reactive to light.     Right eye: Pupil is sluggish.     Left eye: Pupil is sluggish.  Cardiovascular:     Rate and Rhythm: Rhythm irregular.     Pulses: Normal pulses.          Radial pulses are 2+ on the right side and 2+ on the left side.       Posterior tibial pulses are 2+ on the right side and 2+ on the left side.  Pulmonary:     Effort: Tachypnea present.     Breath sounds: Examination of the right-middle field reveals decreased breath sounds. Examination of the left-middle field reveals decreased breath sounds. Examination of the right-lower field reveals decreased breath sounds. Examination of the left-lower field reveals decreased breath sounds. Decreased breath sounds present.  Abdominal:     General: Bowel sounds are normal.  Skin:    General: Skin is warm.     Capillary Refill: Capillary refill takes 2 to 3 seconds.  Neurological:     Mental Status: He is unresponsive.     GCS:  GCS eye subscore is 1. GCS verbal subscore is 1. GCS motor subscore is 3.      Messaged RN to verify patient's mental status at the beginning of his shift.  RN reports patient was responsive to pain at the time and was very agitated she reports giving 1 mg of Ativan  iv and patient fell asleep around 2200.    Interventions/ Plan    Blood cultures  Repeate Lactic acid  VBG  Antibiotics for concerning c-xray doxycycline  & zosyn      Update 0300: pt more responsive with non purposeful  movements reported bilaterally. GCS 4-1-5  repeat   sodium 162. Concern for hypovolemic hypernatremia given Spec grav, urine osmolarity and urine sodium. Lactic 3.3. recommend intravascular volume repletion first with isotonic fluid then controlled free water with d5 to slowly correct sodium. Will add 250 cc NS bolus and reassess sodium and lactate levels.  Given acute neuro change shortly after geodon  admin consider d/c and using other medications for agitation.    Severino Paolo Donati- Aram BSN RN CCRN AGACNP-BC Acute Care Nurse Practitioner [facility]

## 2024-05-13 NOTE — Progress Notes (Addendum)
 Progress note:   S: Pt more somnolent, hyperventilating. Was excessively agitated and received Geodon  13:35.    O: increased resp rate, intermittently accessory muscle use and intermittently more calm and closer to normal RR. Reg rhythm, tachycardic. Lungs clear and equal bilateral. NOt following commands. Pupils small, equal   16:30 ABG - alkalosis resp Glc 247  13:00 Na 160  10:30:  BMP Na 161, Cl 123, CO2 21, Glc 301, renal fxn normal     A/P:   Resp alkalosis Question metab acidosis d/t dehdration, need BMP which was ordered stat and pending  Question primary respiratoy / autonomic problem - CT head ordered, lung exam fairly normal will prioritize neuro / metabolic w/u  BMP Lactate CT head   Spoke w/ patient's daughter and wife at bedside If severe worsening on CT head / brain, there is little we can do other than comfort measures and family agrees. If suspect primarily metabolic process will continue treat as able and revisit GOC as needed     ADDENDUM 6:30 PM  Lactate 4.2 BMP still not done, reordered STAT along w/ CBC New fever - giving tylenol  Family requesting morphine  as he appears in some discomfort, confirmed this is for symptoms now and NOT for deescalation/transition off tx  Personal review CT head - concern possible mild expansion of L SDH but difficult to tell, does not appear to be any new catastrophic change, await rad report  Bolus D5W 1000 mL UA CXR   Daughter Haroldine also at bedside now, she is a development worker, community. Pt's wife and other daughter are also present. Reviewed results and plan as above        CRITICAL CARE Performed by: Laneta Blunt   Total critical care time: 40 minutes  Critical care time was exclusive of separately billable procedures and treating other patients.  Critical care was necessary to treat or prevent imminent or life-threatening deterioration.  Critical care was time spent personally by me on the following activities:  development of treatment plan with patient and/or surrogate as well as nursing, discussions with consultants, evaluation of patient's response to treatment, examination of patient, obtaining history from patient or surrogate, ordering and performing treatments and interventions, ordering and review of laboratory studies, ordering and review of radiographic studies, pulse oximetry and re-evaluation of patient's condition.

## 2024-05-14 DIAGNOSIS — E860 Dehydration: Secondary | ICD-10-CM

## 2024-05-14 DIAGNOSIS — N179 Acute kidney failure, unspecified: Secondary | ICD-10-CM

## 2024-05-14 LAB — BLOOD GAS, VENOUS
Acid-Base Excess: 3.4 mmol/L — ABNORMAL HIGH (ref 0.0–2.0)
Bicarbonate: 26.8 mmol/L (ref 20.0–28.0)
O2 Saturation: 51.7 %
Patient temperature: 37
pCO2, Ven: 36 mmHg — ABNORMAL LOW (ref 44–60)
pH, Ven: 7.48 — ABNORMAL HIGH (ref 7.25–7.43)
pO2, Ven: 33 mmHg (ref 32–45)

## 2024-05-14 LAB — BASIC METABOLIC PANEL WITH GFR
Anion gap: 13 (ref 5–15)
Anion gap: 14 (ref 5–15)
Anion gap: 18 — ABNORMAL HIGH (ref 5–15)
BUN: 53 mg/dL — ABNORMAL HIGH (ref 8–23)
BUN: 56 mg/dL — ABNORMAL HIGH (ref 8–23)
BUN: 68 mg/dL — ABNORMAL HIGH (ref 8–23)
CO2: 24 mmol/L (ref 22–32)
CO2: 21 mmol/L — ABNORMAL LOW (ref 22–32)
CO2: 19 mmol/L — ABNORMAL LOW (ref 22–32)
Calcium: 9 mg/dL (ref 8.9–10.3)
Calcium: 8.9 mg/dL (ref 8.9–10.3)
Calcium: 9 mg/dL (ref 8.9–10.3)
Chloride: 125 mmol/L — ABNORMAL HIGH (ref 98–111)
Chloride: 127 mmol/L — ABNORMAL HIGH (ref 98–111)
Chloride: 128 mmol/L — ABNORMAL HIGH (ref 98–111)
Creatinine, Ser: 1.49 mg/dL — ABNORMAL HIGH (ref 0.61–1.24)
Creatinine, Ser: 1.45 mg/dL — ABNORMAL HIGH (ref 0.61–1.24)
Creatinine, Ser: 1.85 mg/dL — ABNORMAL HIGH (ref 0.61–1.24)
GFR, Estimated: 47 mL/min — ABNORMAL LOW (ref >60)
GFR, Estimated: 49 mL/min — ABNORMAL LOW (ref >60)
GFR, Estimated: 36 mL/min — ABNORMAL LOW (ref >60)
Glucose, Bld: 128 mg/dL — ABNORMAL HIGH (ref 70–99)
Glucose, Bld: 110 mg/dL — ABNORMAL HIGH (ref 70–99)
Glucose, Bld: 243 mg/dL — ABNORMAL HIGH (ref 70–99)
Potassium: 3.8 mmol/L (ref 3.5–5.1)
Potassium: 4.1 mmol/L (ref 3.5–5.1)
Potassium: 4.3 mmol/L (ref 3.5–5.1)
Sodium: 162 mmol/L (ref 135–145)
Sodium: 162 mmol/L (ref 135–145)
Sodium: 165 mmol/L (ref 135–145)

## 2024-05-14 LAB — LACTIC ACID, PLASMA
Lactic Acid, Venous: 3.3 mmol/L (ref 0.5–1.9)
Lactic Acid, Venous: 2.2 mmol/L (ref 0.5–1.9)

## 2024-05-14 LAB — GLUCOSE, CAPILLARY
Glucose-Capillary: 93 mg/dL (ref 70–99)
Glucose-Capillary: 165 mg/dL — ABNORMAL HIGH (ref 70–99)
Glucose-Capillary: 191 mg/dL — ABNORMAL HIGH (ref 70–99)
Glucose-Capillary: 238 mg/dL — ABNORMAL HIGH (ref 70–99)

## 2024-05-14 MED ORDER — HALOPERIDOL 0.5 MG PO TABS: 0.5000 mg | ORAL_TABLET | ORAL | Status: DC | PRN

## 2024-05-14 MED ORDER — SODIUM CHLORIDE 0.9 % IV BOLUS
250.0000 mL | Freq: Once | INTRAVENOUS | Status: AC
  Administered 2024-05-14: 04:00:00 250 mL via INTRAVENOUS

## 2024-05-14 MED ORDER — LORAZEPAM 1 MG PO TABS: 1.0000 mg | ORAL_TABLET | ORAL | Status: DC | PRN

## 2024-05-14 MED ORDER — ONDANSETRON 4 MG PO TBDP: 4.0000 mg | ORAL_TABLET | Freq: Four times a day (QID) | ORAL | Status: DC | PRN

## 2024-05-14 MED ORDER — GLYCOPYRROLATE 1 MG PO TABS: 1.0000 mg | ORAL_TABLET | ORAL | Status: DC | PRN

## 2024-05-14 MED ORDER — HALOPERIDOL LACTATE 2 MG/ML PO CONC: 0.5000 mg | ORAL | Status: DC | PRN

## 2024-05-14 MED ORDER — GLYCOPYRROLATE 0.2 MG/ML IJ SOLN: 0.2000 mg | INTRAMUSCULAR | Status: DC | PRN

## 2024-05-14 MED ORDER — LORAZEPAM 2 MG/ML PO CONC: 1.0000 mg | ORAL | Status: DC | PRN

## 2024-05-14 MED ORDER — MORPHINE SULFATE (PF) 2 MG/ML IV SOLN: 1.0000 mg | INTRAVENOUS | Status: DC | PRN

## 2024-05-14 MED ORDER — LOPERAMIDE HCL 2 MG PO CAPS: 2.0000 mg | ORAL_CAPSULE | ORAL | Status: DC | PRN

## 2024-05-14 MED ORDER — POLYVINYL ALCOHOL 1.4 % OP SOLN: 1.0000 [drp] | Freq: Four times a day (QID) | OPHTHALMIC | Status: DC | PRN

## 2024-05-14 MED ORDER — DIPHENHYDRAMINE HCL 50 MG/ML IJ SOLN: 12.5000 mg | INTRAMUSCULAR | Status: DC | PRN

## 2024-05-14 MED ORDER — DEXTROSE 5 % IV SOLN
INTRAVENOUS | Status: DC
  Filled 2024-05-14 (×2): qty 1000

## 2024-05-14 MED ORDER — HALOPERIDOL LACTATE 5 MG/ML IJ SOLN: 0.5000 mg | INTRAMUSCULAR | Status: DC | PRN

## 2024-05-14 MED ORDER — LORAZEPAM 2 MG/ML IJ SOLN
1.0000 mg | INTRAMUSCULAR | Status: DC | PRN
  Administered 2024-05-14 – 2024-05-15 (×3): 1 mg via INTRAVENOUS
  Filled 2024-05-14 (×3): qty 1

## 2024-05-14 MED ORDER — ONDANSETRON HCL 4 MG/2ML IJ SOLN: 4.0000 mg | Freq: Four times a day (QID) | INTRAMUSCULAR | Status: DC | PRN

## 2024-05-14 NOTE — Inpatient Diabetes Management (Addendum)
 Inpatient Diabetes Program Recommendations  AACE/ADA: New Consensus Statement on Inpatient Glycemic Control   Target Ranges:  Prepandial:   less than 140 mg/dL      Peak postprandial:   less than 180 mg/dL (1-2 hours)      Critically ill patients:  140 - 180 mg/dL    Latest Reference Range & Units 05/13/24 07:02 05/13/24 09:05 05/13/24 12:28 05/13/24 16:26 05/13/24 19:45 05/13/24 23:52 05/14/24 03:42 05/14/24 08:38  Glucose-Capillary 70 - 99 mg/dL 850 (H) 764 (H) 722 (H) 247 (H) 281 (H) 177 (H) 93 165 (H)   Review of Glycemic Control  Diabetes history: DM2 Outpatient Diabetes medications: Tresiba  23 units daily, Januvia  100 mg daily Current orders for Inpatient glycemic control: Lantus  10 units daily, Novolog  0-15 units Q4H; D5@125  ml/hr   Inpatient Diabetes Program Recommendations:     Insulin : If appropriate for patient situation, please consider increasing Lantus  to 14 units daily.  Thanks, Earnie Gainer, RN, MSN, CDCES Diabetes Coordinator Inpatient Diabetes Program [phone] (Team Pager from 8am to 5pm)

## 2024-05-14 NOTE — Progress Notes (Signed)
 PT Cancellation Note  Patient Details Name: Dustin Ferrell MRN: [id number] DOB: Jun 12, 1945   Cancelled Treatment:    Reason Eval/Treat Not Completed: Fatigue/lethargy limiting ability to participate (Chart reviewed for planned treatment session.  Noted red MEWS, decrease in level of alertness, significant hypernatremia.  Per primary RN, unable to maintain alertness for participation with session. Will continue to follow and resume as appropriate; noted palliative care following patient.)  Charnele Semple H. Delores, PT, DPT, NCS 05/14/2024, 3:20 PM [phone]

## 2024-05-14 NOTE — Progress Notes (Signed)
 Palliative Care Progress Note, Assessment & Plan   Patient Name: Dustin Ferrell       Date: 05/14/2024 DOB: 01-13-1946  Age: 78 y.o. MRN#: [id number] Attending Physician: Marsa Edelman, DO Primary Care Physician: Dustin Ophelia JINNY DOUGLAS, MD Admit Date: 05/03/2024  Subjective: Patient is lying in bed in mild distress.  His brows are furloughed.  He does not awaken to my presence.  He has.'s of breast and then has moments where he is groaning and his arms are fidgety.  Patient's wife and daughter are present at bedside in addition to attending Dr. Marsa during my visit.  HPI: 78 y.o. 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   CT head revealing areas of hemorrhagic contusions, SDH and subarachnoid hemorrhage--neurosurgery consulted--no surgical interventions CT facial revealing parietal skull fracture   Hospital course has been complicated by development of A-fib with RVR and agitation as well as development of significant hypernatremia--slowly improving   12/14 sodium improved to 157 however on 12/16 is 162. Renal function was improving 12/15 Cr of 1.2 however on 12/16 Cr is 1.85.   Palliative medicine was consulted for assisting with goals of care conversations  Summary of counseling/coordination of care: Extensive chart review completed prior to meeting patient including labs, vital signs, imaging, progress notes, orders, and available advanced directive documents from current and previous encounters.   After reviewing the patient's chart and assessing the patient at bedside, I spoke with patient's wife and daughter at bedside  in regards to symptom management and goals of care.   Medical update given.  Discussed course of hospitalization and treatments given to combat his hypernatremia, colopathy, dehydration, AKI, and hypo and hyperglycemic events.  Reviewed that despite all medical efforts, patient is not showing signs of improvement.  In fact, he is showing signs of deterioration.  Discussed limitations of ones earthly body, human mortality, and quantity and quality of life.  After lengthy discussion of course of hospitalization and various treatments attempted to improve patient's functional, nutritional, and cognitive status, we discussed goals and values important to patient and family.  Patient's family endorses they would never want patient to suffer.  They are struggling to know when to shift to comfort focused care as they want to make sure they have given him his best shot to improve.  Family also highlighted that patient would never want to be kept alive his life prolonged with artificial means.  In light of these comments, comfort measures discussed in detail. Mr. Tennis would no longer receive aggressive medical interventions such as continuous vital signs, lab work, radiology testing, or medications not focused on comfort. All care would focus on how he is looking and feeling. This would include management of any symptoms that may cause discomfort, pain, shortness of breath, cough, nausea, agitation, anxiety, and/or secretions etc. Symptoms would be managed with medications and other non-pharmacological interventions such as spiritual support if requested, repositioning, music therapy, or therapeutic listening.   Family verbalized understanding and appreciation.  They ask if patient can have consistent scheduled Ativan   as this seems to keep him calm.  Discussed the delicate balance of giving medications to address patient's symptoms but also allowing treatments to show signs of improvement in his cognitive and  respiratory status.  Family endorsed understanding and shared they wish to speak with other members of the family before making any change to plan of care.  Dr. Marsa and I plan to circle back with patient's family this afternoon.  No change to plan of care at this time.  Addendum: I was notified by RN that family has decided to shift to full comfort measures.  Dr. Marsa has placed comfort orders.  PMT will continue to follow and support with comfort focused care during this hospitalization.  Physical Exam Vitals reviewed.  Constitutional:      General: He is in acute distress.     Appearance: He is normal weight.  HENT:     Head: Normocephalic.     Mouth/Throat:     Mouth: Mucous membranes are moist.  Cardiovascular:     Rate and Rhythm: Tachycardia present. Rhythm irregular.  Pulmonary:     Effort: Pulmonary effort is normal.  Abdominal:     Palpations: Abdomen is soft.  Musculoskeletal:     Comments: Generalized weakness  Skin:    General: Skin is warm and dry.  Neurological:     Comments: nonverbal              Recommendations:   Comfort measures only Aggressive symptom management for end-of-life care  50 minute visit includes: Detailed review of medical records (labs, imaging, vital signs), medically appropriate exam (mental status, respiratory, cardiac, skin), discussed with treatment team, counseling and educating patient, family and staff, documenting clinical information, medication management and coordination of care.  Lamarr L. Arvid, DNP, FNP-BC Palliative Medicine Team

## 2024-05-14 NOTE — Progress Notes (Signed)
 Patient arrived to the floor at 1915 and family was actively crying and in distress. Daughter asked for chaplain support if available.   Chaplain paged.

## 2024-05-14 NOTE — TOC Progression Note (Signed)
 Transition of Care [facility]) - Progression Note    Patient Details  Name: Dustin Ferrell MRN: [id number] Date of Birth: 02-28-1946  [facility]) CM/SW Contact  Alfonso Rummer, LCSW Phone Number: 05/14/2024, 4:42 PM  Clinical Narrative:      KEN DELENA Rummer attempted to visit with pt and pt spouse twice. Spouse not in room during the time toc came to visit. LCSW A Rummer called pt spouse via phone and left message requesting call back.                    Expected Discharge Plan and Services                                               Social Drivers of Health (SDOH) Interventions SDOH Screenings   Food Insecurity: No Food Insecurity (05/04/2024)  Housing: Low Risk (05/04/2024)  Transportation Needs: No Transportation Needs (05/04/2024)  Utilities: Not At Risk (05/04/2024)  Financial Resource Strain: Low Risk  (05/03/2024)   Received from [facility]  Social Connections: Unknown (05/04/2024)  Tobacco Use: High Risk (05/03/2024)    Readmission Risk Interventions     No data to display

## 2024-05-14 NOTE — Progress Notes (Signed)
 PROGRESS NOTE    Dustin Ferrell   FMW:[id number] DOB: 05-14-1946  DOA: 05/03/2024 Date of Service: 05/14/2024 which is hospital day 11  PCP: [name], MD    Hospital course / significant events:    12/05: Admitted to [facility] with multifocal traumatic ICH and acute right parietal skull fracture. Neurosurgery consulted and recommended non-operative management.  12/07: Worsening mentation observed; repeat CT head showed mild progression of hemorrhage. Subcutaneous heparin  discontinued  12/09: Mental status improved; patient alert and following commands. EEG negative for seizures. Actively participating in physical therapy. 12/10: Developed new-onset Afib with RVR and agitation. Treated with IV lorazepam  2 mg for sedation and IV metoprolol  5 mg for rate control; heart rate controlled into the low 100s. 12/12: Severe hypernatremia (Na 159-161 mEq/L) and persistent hyperglycemia noted. Initiated IV D5W for sodium correction and IV insulin  drip for glucose control. Neurosurgery recommended q2h sodium monitoring and ICU-level care. Nephrology consulted. ICU consulted for close monitoring, electrolyte management, and insulin  infusion. 12/13: Titrating D5W infusion for slow correction of hypernatremia. DC insulin  gtt, transitioned to lispro SS 12/14: hospitalists assume care, per family pt more alert today. Restarting IV fluids d/t persistent hypernatremia, if he does not start taking more po will revisit GOC w/ that in mind 12/15: adjusted insulin  basal + SSI q4h (hypoglycemic event), remains hypernatremic - likely hyperglycemia causing osmotic diuresis worsening hypernatremia but D5W fluids only good option for sodium correction and is making Glc management a challenge.  12/16: remains more encephalopathic and more agitated today requiring mor prn meds to keep calm/peaceful. Discussion today w/ family - he is not improving and is unlikely to recover to what he would consider a meaningful QOL.  Decision made to transition to comfort measures only     Consultants:  [facility] Neurosurgery Nephrology   Procedures/Surgeries: none      ASSESSMENT & PLAN:   Comfort measures only  See orders - prn meds for symptoms pain, air hunger, agitation, secretions.  Frequent checks to ensure patient is comfortable and not in distress Morphine  currently prn, may escalate to drip if needed, contact hospitalist if concern he might benefit from this over the prn push dosing Discontinue labs Discontinue non-comfort medications  Occasional vital signs may guide prognosis / expectations but not reliably, may obtain VS if family requests  Consider for hospice placement if desired but I expect he will not remain stable for transport anywhere   Hospital problems addressed:  Metabolic Encephalopathy, multifactorial  Due to TBI, SDH/ICH  Due to dehydration, hypernatremia  Complicated by underlying dementia TBI, SDH/ICH  Hypernatremia  Dehydration question metabolic acidosis w/ compensatory respiratory alkalosis DM2 w/ hyperglycemia  Hypoglycemia d/t insulin  + poor po intake  Frontotemporal dementia  Afib with RVR - resolved  overweight based on BMI: Body mass index is 25.13 kg/m.       Subjective / Brief ROS:  Patient more somnolent today not contributory      Family Communication: family at bedside on rounds (wife and daughter)      Objective Findings:  Vitals:   05/14/24 0731 05/14/24 0936 05/14/24 1101 05/14/24 1524  BP: (!) 95/46  114/68 119/67  Pulse: 92  (!) 116 99  Resp:   (!) 27 (!) 24  Temp: 99.1 F (37.3 C) (P) 99.4 F (37.4 C) 98.6 F (37 C) 99.1 F (37.3 C)  TempSrc:      SpO2: (!) 84%  (!) 88% 91%  Weight:      Height:  Intake/Output Summary (Last 24 hours) at 05/14/2024 1714 Last data filed at 05/14/2024 1652 Gross per 24 hour  Intake --  Output 1025 ml  Net -1025 ml   Filed Weights   05/12/24 0500 05/13/24 0500 05/14/24 0347  Weight:  68.5 kg 68.8 kg 69.1 kg    Examination:  Physical Exam Constitutional:      General: He is sleeping. He is not in acute distress.    Appearance: He is ill-appearing.  Cardiovascular:     Rate and Rhythm: Regular rhythm. Tachycardia present.  Pulmonary:     Effort: Accessory muscle usage and retractions present.     Breath sounds: Decreased air movement present.  Neurological:     Mental Status: He is lethargic and disoriented.  Psychiatric:        Speech: He is noncommunicative.          Scheduled Medications:   Gerhardt's butt cream   Topical BID    Continuous Infusions:    PRN Medications:  acetaminophen  **OR** acetaminophen  (TYLENOL ) oral liquid 160 mg/5 mL **OR** acetaminophen , artificial tears, diphenhydrAMINE , glycopyrrolate  **OR** glycopyrrolate  **OR** glycopyrrolate , haloperidol  **OR** haloperidol  **OR** haloperidol  lactate, loperamide , LORazepam  **OR** LORazepam  **OR** LORazepam , morphine  injection, morphine  injection, ondansetron  **OR** ondansetron  (ZOFRAN ) IV  Antimicrobials from admission:  Anti-infectives (From admission, onward)    Start     Dose/Rate Route Frequency Ordered Stop   05/13/24 2200  piperacillin -tazobactam (ZOSYN ) IVPB 3.375 g  Status:  Discontinued        3.375 g 12.5 mL/hr over 240 Minutes Intravenous Every 8 hours 05/13/24 2050 05/14/24 1526   05/13/24 2130  doxycycline  (VIBRAMYCIN ) 100 mg in sodium chloride  0.9 % 250 mL IVPB  Status:  Discontinued        100 mg 125 mL/hr over 120 Minutes Intravenous Every 12 hours 05/13/24 2015 05/14/24 1526   05/13/24 2100  cefTRIAXone  (ROCEPHIN ) 2 g in sodium chloride  0.9 % 100 mL IVPB  Status:  Discontinued        2 g 200 mL/hr over 30 Minutes Intravenous Every 24 hours 05/13/24 2012 05/13/24 2050   05/13/24 2100  azithromycin (ZITHROMAX) 500 mg in sodium chloride  0.9 % 250 mL IVPB  Status:  Discontinued        500 mg 250 mL/hr over 60 Minutes Intravenous Every 24 hours 05/13/24 2012 05/13/24 2015            Data Reviewed:  I have personally reviewed the following...  CBC: Recent Labs  Lab 05/09/24 0849 05/11/24 0346 05/12/24 0316 05/13/24 1858  WBC 10.2 13.5* 12.0* 15.8*  HGB 12.8* 13.5 11.8* 14.2  HCT 39.5 42.8 37.0* 44.1  MCV 92.1 94.9 93.4 92.3  PLT 234 267 242 389   Basic Metabolic Panel: Recent Labs  Lab 05/09/24 0849 05/10/24 0909 05/12/24 0909 05/12/24 1318 05/13/24 1030 05/13/24 1311 05/13/24 1858 05/14/24 0134 05/14/24 0438 05/14/24 1230  NA 152*   < > 155*   < > 161* 160* 160* 162* 162* 165*  K 3.5   < > 3.6  --  4.7  --  4.1 3.8 4.1 4.3  CL 117*   < > 121*  --  123*  --  124* 125* 127* 128*  CO2 21*   < > 18*  --  21*  --  22 24 21* 19*  GLUCOSE 247*   < > 237*  --  301*  --  286* 128* 110* 243*  BUN 62*   < > 81*  --  49*  --  51* 53* 56* 68*  CREATININE 1.08   < > 1.34*  --  1.20  --  1.37* 1.49* 1.45* 1.85*  CALCIUM  9.5   < > 8.9  --  9.5  --  9.2 9.0 8.9 9.0  MG 3.3*  --  3.5*  --   --   --   --   --   --   --   PHOS 3.1  --   --   --   --   --   --   --   --   --    < > = values in this interval not displayed.   GFR: Estimated Creatinine Clearance: 27.7 mL/min (A) (by C-G formula based on SCr of 1.85 mg/dL (H)). Liver Function Tests: Recent Labs  Lab 05/09/24 0849 05/13/24 1858  AST  --  55*  ALT  --  80*  ALKPHOS  --  141*  BILITOT  --  0.4  PROT  --  6.9  ALBUMIN 3.0* 2.8*   No results for input(s): LIPASE, AMYLASE in the last 168 hours. No results for input(s): AMMONIA in the last 168 hours.  Coagulation Profile: No results for input(s): INR, PROTIME in the last 168 hours. Cardiac Enzymes: No results for input(s): CKTOTAL, CKMB, CKMBINDEX, TROPONINI in the last 168 hours. BNP (last 3 results) No results for input(s): PROBNP in the last 8760 hours. HbA1C: No results for input(s): HGBA1C in the last 72 hours. CBG: Recent Labs  Lab 05/13/24 2352 05/14/24 0342 05/14/24 0838 05/14/24 1125  05/14/24 1635  GLUCAP 177* 93 165* 191* 238*   Lipid Profile: No results for input(s): CHOL, HDL, LDLCALC, TRIG, CHOLHDL, LDLDIRECT in the last 72 hours. Thyroid Function Tests: No results for input(s): TSH, T4TOTAL, FREET4, T3FREE, THYROIDAB in the last 72 hours.  Anemia Panel: No results for input(s): VITAMINB12, FOLATE, FERRITIN, TIBC, IRON, RETICCTPCT in the last 72 hours. Most Recent Urinalysis On File:     Component Value Date/Time   COLORURINE YELLOW (A) 05/11/2024 0256   APPEARANCEUR CLOUDY (A) 05/11/2024 0256   LABSPEC 1.020 05/11/2024 0256   PHURINE 5.0 05/11/2024 0256   GLUCOSEU >=500 (A) 05/11/2024 0256   HGBUR NEGATIVE 05/11/2024 0256   BILIRUBINUR NEGATIVE 05/11/2024 0256   KETONESUR NEGATIVE 05/11/2024 0256   PROTEINUR NEGATIVE 05/11/2024 0256   UROBILINOGEN 0.2 09/30/2014 2227   NITRITE NEGATIVE 05/11/2024 0256   LEUKOCYTESUR NEGATIVE 05/11/2024 0256   Sepsis Labs: @LABRCNTIP (procalcitonin:4,lacticidven:4) Microbiology: Recent Results (from the past 240 hours)  Resp panel by RT-PCR (RSV, Flu A&B, Covid) Anterior Nasal Swab     Status: None   Collection Time: 05/06/24  3:26 PM   Specimen: Anterior Nasal Swab  Result Value Ref Range Status   SARS Coronavirus 2 by RT PCR NEGATIVE NEGATIVE Final    Comment: (NOTE) SARS-CoV-2 target nucleic acids are NOT DETECTED.  The SARS-CoV-2 RNA is generally detectable in upper respiratory specimens during the acute phase of infection. The lowest concentration of SARS-CoV-2 viral copies this assay can detect is 138 copies/mL. A negative result does not preclude SARS-Cov-2 infection and should not be used as the sole basis for treatment or other patient management decisions. A negative result may occur with  improper specimen collection/handling, submission of specimen other than nasopharyngeal swab, presence of viral mutation(s) within the areas targeted by this assay, and inadequate  number of viral copies(<138 copies/mL). A negative result must be combined with clinical observations, patient history, and epidemiological information. The expected result is Negative.  Fact Sheet for Patients:  bloggercourse.com  Fact Sheet for Healthcare Providers:  seriousbroker.it  This test is no t yet approved or cleared by the [place]  FDA and  has been authorized for detection and/or diagnosis of SARS-CoV-2 by FDA under an Emergency Use Authorization (EUA). This EUA will remain  in effect (meaning this test can be used) for the duration of the COVID-19 declaration under Section 564(b)(1) of the Act, 21 U.S.C.section 360bbb-3(b)(1), unless the authorization is terminated  or revoked sooner.       Influenza A by PCR NEGATIVE NEGATIVE Final   Influenza B by PCR NEGATIVE NEGATIVE Final    Comment: (NOTE) The Xpert Xpress SARS-CoV-2/FLU/RSV plus assay is intended as an aid in the diagnosis of influenza from Nasopharyngeal swab specimens and should not be used as a sole basis for treatment. Nasal washings and aspirates are unacceptable for Xpert Xpress SARS-CoV-2/FLU/RSV testing.  Fact Sheet for Patients: bloggercourse.com  Fact Sheet for Healthcare Providers: seriousbroker.it  This test is not yet approved or cleared by the [place]  FDA and has been authorized for detection and/or diagnosis of SARS-CoV-2 by FDA under an Emergency Use Authorization (EUA). This EUA will remain in effect (meaning this test can be used) for the duration of the COVID-19 declaration under Section 564(b)(1) of the Act, 21 U.S.C. section 360bbb-3(b)(1), unless the authorization is terminated or revoked.     Resp Syncytial Virus by PCR NEGATIVE NEGATIVE Final    Comment: (NOTE) Fact Sheet for Patients: bloggercourse.com  Fact Sheet for Healthcare  Providers: seriousbroker.it  This test is not yet approved or cleared by the [place]  FDA and has been authorized for detection and/or diagnosis of SARS-CoV-2 by FDA under an Emergency Use Authorization (EUA). This EUA will remain in effect (meaning this test can be used) for the duration of the COVID-19 declaration under Section 564(b)(1) of the Act, 21 U.S.C. section 360bbb-3(b)(1), unless the authorization is terminated or revoked.  Performed at [facility], [address] [place], [place] [address]   Respiratory (~20 pathogens) panel by PCR     Status: None   Collection Time: 05/06/24  3:26 PM   Specimen: Nasopharyngeal Swab; Respiratory  Result Value Ref Range Status   Adenovirus NOT DETECTED NOT DETECTED Final   Coronavirus 229E NOT DETECTED NOT DETECTED Final    Comment: (NOTE) The Coronavirus on the Respiratory Panel, DOES NOT test for the novel  Coronavirus (2019 nCoV)    Coronavirus HKU1 NOT DETECTED NOT DETECTED Final   Coronavirus NL63 NOT DETECTED NOT DETECTED Final   Coronavirus OC43 NOT DETECTED NOT DETECTED Final   Metapneumovirus NOT DETECTED NOT DETECTED Final   Rhinovirus / Enterovirus NOT DETECTED NOT DETECTED Final   Influenza A NOT DETECTED NOT DETECTED Final   Influenza B NOT DETECTED NOT DETECTED Final   Parainfluenza Virus 1 NOT DETECTED NOT DETECTED Final   Parainfluenza Virus 2 NOT DETECTED NOT DETECTED Final   Parainfluenza Virus 3 NOT DETECTED NOT DETECTED Final   Parainfluenza Virus 4 NOT DETECTED NOT DETECTED Final   Respiratory Syncytial Virus NOT DETECTED NOT DETECTED Final   Bordetella pertussis NOT DETECTED NOT DETECTED Final   Bordetella Parapertussis NOT DETECTED NOT DETECTED Final   Chlamydophila pneumoniae NOT DETECTED NOT DETECTED Final   Mycoplasma pneumoniae NOT DETECTED NOT DETECTED Final    Comment: Performed at [facility], [address], [place], [place] [address]  Culture, blood  (Routine X 2) w Reflex to ID Panel  Status: None (Preliminary result)   Collection Time: 05/13/24  8:44 PM   Specimen: BLOOD  Result Value Ref Range Status   Specimen Description BLOOD BLOOD RIGHT WRIST  Final   Special Requests   Final    BOTTLES DRAWN AEROBIC ONLY Blood Culture results may not be optimal due to an inadequate volume of blood received in culture bottles   Culture   Final    NO GROWTH < 12 HOURS Performed at [facility], [address], [place], [place] [address]    Report Status PENDING  Incomplete  Culture, blood (Routine X 2) w Reflex to ID Panel     Status: None (Preliminary result)   Collection Time: 05/13/24  8:45 PM   Specimen: BLOOD  Result Value Ref Range Status   Specimen Description BLOOD BLOOD LEFT WRIST  Final   Special Requests   Final    BOTTLES DRAWN AEROBIC AND ANAEROBIC Blood Culture results may not be optimal due to an inadequate volume of blood received in culture bottles   Culture   Final    NO GROWTH < 12 HOURS Performed at [facility], [address], [place], [place] [address]    Report Status PENDING  Incomplete      Radiology Studies last 3 days: DG Chest Port 1 View Result Date: 05/13/2024 EXAM: 1 VIEW(S) XRAY OF THE CHEST 05/13/2024 07:10:00 PM COMPARISON: 05/06/2024 CLINICAL HISTORY: SOB (shortness of breath) FINDINGS: LUNGS AND PLEURA: Developing patchy opacities at left lung base. No pleural effusion. No pneumothorax. HEART AND MEDIASTINUM: Atherosclerotic calcifications. No acute abnormality of the cardiac and mediastinal silhouettes. BONES AND SOFT TISSUES: Old left rib fractures. IMPRESSION: 1. Developing patchy opacities at the left lung base, suspicious for aspiration or infection. Electronically signed by: Oneil Devonshire MD 05/13/2024 07:31 PM EST RP Workstation: [id number]   CT HEAD WO CONTRAST ( ) Result Date: 05/13/2024 EXAM: CT HEAD WITHOUT 05/13/2024 05:49:36 PM TECHNIQUE: CT of the head was performed  without the administration of intravenous contrast. Automated exposure control, iterative reconstruction, and/or weight based adjustment of the mA/kV was utilized to reduce the radiation dose to as low as reasonably achievable. COMPARISON: Head CT 05/05/2024 and MRI 05/08/2024. CLINICAL HISTORY: More somnolent, known ICH, eval for worsening bleed. FINDINGS: BRAIN AND VENTRICLES: Bilateral frontal lobe hemorrhagic contusions with associated edema, subdural hematomas over the cerebral convexities measuring up to 3 mm in thickness on the right and 9 mm on the left, small volume subdural hematoma along the falx, scattered bilateral subarachnoid hemorrhage, and trace hemorrhage in the lateral ventricles have not appreciably changed from the prior MRI. A small anterior left temporal lobe contusion and thin subdural hematomas in the posterior fossa were better shown on the prior MRI. Intracranial mass effect including 4 mm of rightward midline shift is unchanged from the prior MRI. The ventricles are unchanged in size without evidence of hydrocephalus. No definite new intracranial hemorrhage or acute infarct is identified. There is mild cerebral atrophy. The basilar cisterns are patent. Calcified atherosclerosis at the skull base. ORBITS: No acute abnormality. SINUSES AND MASTOIDS: Mild mucosal thickening in the paranasal sinuses. Minimal bilateral mastoid fluid. SOFT TISSUES AND SKULL: Unchanged right frontoparietal skull fracture. IMPRESSION: 1. Multifocal traumatic intracranial hemorrhage, unchanged from 05/08/2024. Unchanged mass effect including 4 mm of rightward midline shift. 2. No definite new intracranial abnormality. Electronically signed by: Dasie Hamburg MD 05/13/2024 06:47 PM EST RP Workstation: [id number]          Laneta Blunt, DO [facility]  05/14/2024, 5:14 PM    Dictation software may have been used to generate the above note. Typos may occur and escape review in typed/dictated  notes. Please contact Dr Marsa directly for clarity if needed.  Staff may message me via secure chat in Epic  but this may not receive an immediate response,  please page me for urgent matters!  If 7PM-7AM, please contact night coverage [email or web address]

## 2024-05-14 NOTE — Progress Notes (Signed)
 [facility]  ROUNDING NOTE   Subjective:   Patient unfortunately continues to decline. Quite lethargic today. Serum sodium earlier this a.m. was still high at 162. Patient's wife and daughter at bedside.  Objective:  Vital signs in last 24 hours:  Temp:  [97.5 F (36.4 C)-99.5 F (37.5 C)] 99.1 F (37.3 C) (12/16 1524) Pulse Rate:  [92-116] 99 (12/16 1524) Resp:  [20-27] 24 (12/16 1524) BP: (95-119)/(46-77) 119/67 (12/16 1524) SpO2:  [84 %-98 %] 91 % (12/16 1524) Weight:  [69.1 kg] 69.1 kg (12/16 0347)  Weight change: 0.3 kg Filed Weights   05/12/24 0500 05/13/24 0500 05/14/24 0347  Weight: 68.5 kg 68.8 kg 69.1 kg    Intake/Output: I/O last 3 completed shifts: In: 360 [P.O.:360] Out: 3950 [Urine:3950]   Intake/Output this shift:  Total I/O In: -  Out: 275 [Urine:275]  Physical Exam: General: Agitated  Head: Facial bruising noted.  Dry oral mucosal membranes  Neck: Supple  Lungs:  Clear to auscultation, normal effort  Heart: S1S2 no rubs  Abdomen:  Soft, nontender, bowel sounds present  Extremities: No peripheral edema.  Neurologic: Arousable, agitated  Skin: No acute rash  Access: No hemodialysis access    Basic Metabolic Panel: Recent Labs  Lab 05/09/24 0849 05/10/24 0909 05/12/24 0909 05/12/24 1318 05/13/24 1030 05/13/24 1311 05/13/24 1858 05/14/24 0134 05/14/24 0438 05/14/24 1230  NA 152*   < > 155*   < > 161* 160* 160* 162* 162* 165*  K 3.5   < > 3.6  --  4.7  --  4.1 3.8 4.1 4.3  CL 117*   < > 121*  --  123*  --  124* 125* 127* 128*  CO2 21*   < > 18*  --  21*  --  22 24 21* 19*  GLUCOSE 247*   < > 237*  --  301*  --  286* 128* 110* 243*  BUN 62*   < > 81*  --  49*  --  51* 53* 56* 68*  CREATININE 1.08   < > 1.34*  --  1.20  --  1.37* 1.49* 1.45* 1.85*  CALCIUM  9.5   < > 8.9  --  9.5  --  9.2 9.0 8.9 9.0  MG 3.3*  --  3.5*  --   --   --   --   --   --   --   PHOS 3.1  --   --   --   --   --   --   --   --   --    < > =  values in this interval not displayed.    Liver Function Tests: Recent Labs  Lab 05/09/24 0849 05/13/24 1858  AST  --  55*  ALT  --  80*  ALKPHOS  --  141*  BILITOT  --  0.4  PROT  --  6.9  ALBUMIN 3.0* 2.8*   No results for input(s): LIPASE, AMYLASE in the last 168 hours. No results for input(s): AMMONIA in the last 168 hours.   CBC: Recent Labs  Lab 05/09/24 0849 05/11/24 0346 05/12/24 0316 05/13/24 1858  WBC 10.2 13.5* 12.0* 15.8*  HGB 12.8* 13.5 11.8* 14.2  HCT 39.5 42.8 37.0* 44.1  MCV 92.1 94.9 93.4 92.3  PLT 234 267 242 389    Cardiac Enzymes: No results for input(s): CKTOTAL, CKMB, CKMBINDEX, TROPONINI in the last 168 hours.  BNP: Invalid input(s): POCBNP  CBG: Recent Labs  Lab 05/13/24 2352 05/14/24 0342 05/14/24 0838 05/14/24 1125 05/14/24 1635  GLUCAP 177* 93 165* 191* 238*    Microbiology: Results for orders placed or performed during the hospital encounter of 05/03/24  Resp panel by RT-PCR (RSV, Flu A&B, Covid) Anterior Nasal Swab     Status: None   Collection Time: 05/06/24  3:26 PM   Specimen: Anterior Nasal Swab  Result Value Ref Range Status   SARS Coronavirus 2 by RT PCR NEGATIVE NEGATIVE Final    Comment: (NOTE) SARS-CoV-2 target nucleic acids are NOT DETECTED.  The SARS-CoV-2 RNA is generally detectable in upper respiratory specimens during the acute phase of infection. The lowest concentration of SARS-CoV-2 viral copies this assay can detect is 138 copies/mL. A negative result does not preclude SARS-Cov-2 infection and should not be used as the sole basis for treatment or other patient management decisions. A negative result may occur with  improper specimen collection/handling, submission of specimen other than nasopharyngeal swab, presence of viral mutation(s) within the areas targeted by this assay, and inadequate number of viral copies(<138 copies/mL). A negative result must be combined with clinical  observations, patient history, and epidemiological information. The expected result is Negative.  Fact Sheet for Patients:  bloggercourse.com  Fact Sheet for Healthcare Providers:  seriousbroker.it  This test is no t yet approved or cleared by the [place]  FDA and  has been authorized for detection and/or diagnosis of SARS-CoV-2 by FDA under an Emergency Use Authorization (EUA). This EUA will remain  in effect (meaning this test can be used) for the duration of the COVID-19 declaration under Section 564(b)(1) of the Act, 21 U.S.C.section 360bbb-3(b)(1), unless the authorization is terminated  or revoked sooner.       Influenza A by PCR NEGATIVE NEGATIVE Final   Influenza B by PCR NEGATIVE NEGATIVE Final    Comment: (NOTE) The Xpert Xpress SARS-CoV-2/FLU/RSV plus assay is intended as an aid in the diagnosis of influenza from Nasopharyngeal swab specimens and should not be used as a sole basis for treatment. Nasal washings and aspirates are unacceptable for Xpert Xpress SARS-CoV-2/FLU/RSV testing.  Fact Sheet for Patients: bloggercourse.com  Fact Sheet for Healthcare Providers: seriousbroker.it  This test is not yet approved or cleared by the [place]  FDA and has been authorized for detection and/or diagnosis of SARS-CoV-2 by FDA under an Emergency Use Authorization (EUA). This EUA will remain in effect (meaning this test can be used) for the duration of the COVID-19 declaration under Section 564(b)(1) of the Act, 21 U.S.C. section 360bbb-3(b)(1), unless the authorization is terminated or revoked.     Resp Syncytial Virus by PCR NEGATIVE NEGATIVE Final    Comment: (NOTE) Fact Sheet for Patients: bloggercourse.com  Fact Sheet for Healthcare Providers: seriousbroker.it  This test is not yet approved or cleared by  the [place]  FDA and has been authorized for detection and/or diagnosis of SARS-CoV-2 by FDA under an Emergency Use Authorization (EUA). This EUA will remain in effect (meaning this test can be used) for the duration of the COVID-19 declaration under Section 564(b)(1) of the Act, 21 U.S.C. section 360bbb-3(b)(1), unless the authorization is terminated or revoked.  Performed at [facility], [address] [place], [place] [address]   Respiratory (~20 pathogens) panel by PCR     Status: None   Collection Time: 05/06/24  3:26 PM   Specimen: Nasopharyngeal Swab; Respiratory  Result Value Ref Range Status   Adenovirus NOT DETECTED NOT DETECTED Final   Coronavirus 229E NOT  DETECTED NOT DETECTED Final    Comment: (NOTE) The Coronavirus on the Respiratory Panel, DOES NOT test for the novel  Coronavirus (2019 nCoV)    Coronavirus HKU1 NOT DETECTED NOT DETECTED Final   Coronavirus NL63 NOT DETECTED NOT DETECTED Final   Coronavirus OC43 NOT DETECTED NOT DETECTED Final   Metapneumovirus NOT DETECTED NOT DETECTED Final   Rhinovirus / Enterovirus NOT DETECTED NOT DETECTED Final   Influenza A NOT DETECTED NOT DETECTED Final   Influenza B NOT DETECTED NOT DETECTED Final   Parainfluenza Virus 1 NOT DETECTED NOT DETECTED Final   Parainfluenza Virus 2 NOT DETECTED NOT DETECTED Final   Parainfluenza Virus 3 NOT DETECTED NOT DETECTED Final   Parainfluenza Virus 4 NOT DETECTED NOT DETECTED Final   Respiratory Syncytial Virus NOT DETECTED NOT DETECTED Final   Bordetella pertussis NOT DETECTED NOT DETECTED Final   Bordetella Parapertussis NOT DETECTED NOT DETECTED Final   Chlamydophila pneumoniae NOT DETECTED NOT DETECTED Final   Mycoplasma pneumoniae NOT DETECTED NOT DETECTED Final    Comment: Performed at [facility], [address], [place], [place] [address]  Culture, blood (Routine X 2) w Reflex to ID Panel     Status: None (Preliminary result)   Collection Time: 05/13/24   8:44 PM   Specimen: BLOOD  Result Value Ref Range Status   Specimen Description BLOOD BLOOD RIGHT WRIST  Final   Special Requests   Final    BOTTLES DRAWN AEROBIC ONLY Blood Culture results may not be optimal due to an inadequate volume of blood received in culture bottles   Culture   Final    NO GROWTH < 12 HOURS Performed at [facility], [address], [place], [place] [address]    Report Status PENDING  Incomplete  Culture, blood (Routine X 2) w Reflex to ID Panel     Status: None (Preliminary result)   Collection Time: 05/13/24  8:45 PM   Specimen: BLOOD  Result Value Ref Range Status   Specimen Description BLOOD BLOOD LEFT WRIST  Final   Special Requests   Final    BOTTLES DRAWN AEROBIC AND ANAEROBIC Blood Culture results may not be optimal due to an inadequate volume of blood received in culture bottles   Culture   Final    NO GROWTH < 12 HOURS Performed at [facility], [address] [place], [place] [address]    Report Status PENDING  Incomplete    Coagulation Studies: No results for input(s): LABPROT, INR in the last 72 hours.  Urinalysis: No results for input(s): COLORURINE, LABSPEC, PHURINE, GLUCOSEU, HGBUR, BILIRUBINUR, KETONESUR, PROTEINUR, UROBILINOGEN, NITRITE, LEUKOCYTESUR in the last 72 hours.  Invalid input(s): APPERANCEUR     Imaging: DG Chest Port 1 View Result Date: 05/13/2024 EXAM: 1 VIEW(S) XRAY OF THE CHEST 05/13/2024 07:10:00 PM COMPARISON: 05/06/2024 CLINICAL HISTORY: SOB (shortness of breath) FINDINGS: LUNGS AND PLEURA: Developing patchy opacities at left lung base. No pleural effusion. No pneumothorax. HEART AND MEDIASTINUM: Atherosclerotic calcifications. No acute abnormality of the cardiac and mediastinal silhouettes. BONES AND SOFT TISSUES: Old left rib fractures. IMPRESSION: 1. Developing patchy opacities at the left lung base, suspicious for aspiration or infection. Electronically signed by:  Oneil Devonshire MD 05/13/2024 07:31 PM EST RP Workstation: [id number]   CT HEAD WO CONTRAST ( ) Result Date: 05/13/2024 EXAM: CT HEAD WITHOUT 05/13/2024 05:49:36 PM TECHNIQUE: CT of the head was performed without the administration of intravenous contrast. Automated exposure control, iterative reconstruction, and/or weight based adjustment of  the mA/kV was utilized to reduce the radiation dose to as low as reasonably achievable. COMPARISON: Head CT 05/05/2024 and MRI 05/08/2024. CLINICAL HISTORY: More somnolent, known ICH, eval for worsening bleed. FINDINGS: BRAIN AND VENTRICLES: Bilateral frontal lobe hemorrhagic contusions with associated edema, subdural hematomas over the cerebral convexities measuring up to 3 mm in thickness on the right and 9 mm on the left, small volume subdural hematoma along the falx, scattered bilateral subarachnoid hemorrhage, and trace hemorrhage in the lateral ventricles have not appreciably changed from the prior MRI. A small anterior left temporal lobe contusion and thin subdural hematomas in the posterior fossa were better shown on the prior MRI. Intracranial mass effect including 4 mm of rightward midline shift is unchanged from the prior MRI. The ventricles are unchanged in size without evidence of hydrocephalus. No definite new intracranial hemorrhage or acute infarct is identified. There is mild cerebral atrophy. The basilar cisterns are patent. Calcified atherosclerosis at the skull base. ORBITS: No acute abnormality. SINUSES AND MASTOIDS: Mild mucosal thickening in the paranasal sinuses. Minimal bilateral mastoid fluid. SOFT TISSUES AND SKULL: Unchanged right frontoparietal skull fracture. IMPRESSION: 1. Multifocal traumatic intracranial hemorrhage, unchanged from 05/08/2024. Unchanged mass effect including 4 mm of rightward midline shift. 2. No definite new intracranial abnormality. Electronically signed by: Dasie Hamburg MD 05/13/2024 06:47 PM EST RP Workstation: [id number]      Medications:      Gerhardt's butt cream   Topical BID   acetaminophen  **OR** acetaminophen  (TYLENOL ) oral liquid 160 mg/5 mL **OR** acetaminophen , artificial tears, diphenhydrAMINE , glycopyrrolate  **OR** glycopyrrolate  **OR** glycopyrrolate , haloperidol  **OR** haloperidol  **OR** haloperidol  lactate, loperamide , LORazepam  **OR** LORazepam  **OR** LORazepam , morphine  injection, morphine  injection, ondansetron  **OR** ondansetron  (ZOFRAN ) IV  Assessment/ Plan:  78 y.o. male  with a PMHx of hypertension, hyperlipidemia, diabetes mellitus type 2, dementia, prior history of traumatic brain injury, who was admitted to [facility] on 05/03/2024 for evaluation of traumatic brain injury.  He was brought to the hospital by his wife as facial bruising was noted.   1.  Hypernatremia likely secondary to decreased free water intake. 2.  Acute kidney injury. 3.  Traumatic brain injury with bifrontal contusions, and subarachnoid hemorrhage.   Plan: Patient was lethargic but restless this a.m.  Serum sodium remains high at 162.  Palliative care input noted.  It appears that family has now opted for full comfort care which is certainly quite reasonable given current clinical course.  We wish the patient and his family the best in this time of transition.    LOS: 11 Dustin Ferrell 12/16/20254:56 PM

## 2024-05-14 NOTE — Plan of Care (Signed)
°  Problem: Education: Goal: Knowledge of disease or condition will improve Outcome: Progressing Goal: Knowledge of secondary prevention will improve (MUST DOCUMENT ALL) Outcome: Progressing Goal: Knowledge of patient specific risk factors will improve (DELETE if not current risk factor) Outcome: Progressing   Problem: Intracerebral Hemorrhage Tissue Perfusion: Goal: Complications of Intracerebral Hemorrhage will be minimized Outcome: Progressing   Problem: Coping: Goal: Will verbalize positive feelings about self Outcome: Progressing Goal: Will identify appropriate support needs Outcome: Progressing   Problem: Health Behavior/Discharge Planning: Goal: Ability to manage health-related needs will improve Outcome: Progressing Goal: Goals will be collaboratively established with patient/family Outcome: Progressing   Problem: Self-Care: Goal: Ability to participate in self-care as condition permits will improve Outcome: Progressing Goal: Verbalization of feelings and concerns over difficulty with self-care will improve Outcome: Progressing Goal: Ability to communicate needs accurately will improve Outcome: Progressing   Problem: Nutrition: Goal: Risk of aspiration will decrease Outcome: Progressing Goal: Dietary intake will improve Outcome: Progressing   Problem: Education: Goal: Ability to describe self-care measures that may prevent or decrease complications (Diabetes Survival Skills Education) will improve Outcome: Progressing Goal: Individualized Educational Video(s) Outcome: Progressing   Problem: Coping: Goal: Ability to adjust to condition or change in health will improve Outcome: Progressing   Problem: Fluid Volume: Goal: Ability to maintain a balanced intake and output will improve Outcome: Progressing   Problem: Health Behavior/Discharge Planning: Goal: Ability to identify and utilize available resources and services will improve Outcome: Progressing Goal:  Ability to manage health-related needs will improve Outcome: Progressing   Problem: Metabolic: Goal: Ability to maintain appropriate glucose levels will improve Outcome: Progressing   Problem: Nutritional: Goal: Maintenance of adequate nutrition will improve Outcome: Progressing Goal: Progress toward achieving an optimal weight will improve Outcome: Progressing   Problem: Skin Integrity: Goal: Risk for impaired skin integrity will decrease Outcome: Progressing   Problem: Tissue Perfusion: Goal: Adequacy of tissue perfusion will improve Outcome: Progressing   Problem: Education: Goal: Knowledge of General Education information will improve Description: Including pain rating scale, medication(s)/side effects and non-pharmacologic comfort measures Outcome: Progressing   Problem: Health Behavior/Discharge Planning: Goal: Ability to manage health-related needs will improve Outcome: Progressing   Problem: Clinical Measurements: Goal: Ability to maintain clinical measurements within normal limits will improve Outcome: Progressing Goal: Will remain free from infection Outcome: Progressing Goal: Diagnostic test results will improve Outcome: Progressing Goal: Respiratory complications will improve Outcome: Progressing Goal: Cardiovascular complication will be avoided Outcome: Progressing   Problem: Activity: Goal: Risk for activity intolerance will decrease Outcome: Progressing   Problem: Nutrition: Goal: Adequate nutrition will be maintained Outcome: Progressing   Problem: Coping: Goal: Level of anxiety will decrease Outcome: Progressing   Problem: Elimination: Goal: Will not experience complications related to bowel motility Outcome: Progressing Goal: Will not experience complications related to urinary retention Outcome: Progressing   Problem: Pain Managment: Goal: General experience of comfort will improve and/or be controlled Outcome: Progressing   Problem:  Safety: Goal: Ability to remain free from injury will improve Outcome: Progressing   Problem: Skin Integrity: Goal: Risk for impaired skin integrity will decrease Outcome: Progressing   Problem: Activity: Goal: Ability to tolerate increased activity will improve Outcome: Progressing   Problem: Clinical Measurements: Goal: Ability to maintain a body temperature in the normal range will improve Outcome: Progressing   Problem: Respiratory: Goal: Ability to maintain adequate ventilation will improve Outcome: Progressing Goal: Ability to maintain a clear airway will improve Outcome: Progressing

## 2024-05-14 NOTE — Progress Notes (Signed)
°   05/14/24 2200  Spiritual Encounters  Type of Visit Initial  Care provided to: [facility] partners present during encounter Nurse  Referral source Nurse (RN/NT/LPN)  Reason for visit Urgent spiritual support  OnCall Visit Yes  Interventions  Spiritual Care Interventions Made Compassionate presence;Encouragement  Intervention Outcomes  Outcomes Connection to spiritual care   Chaplain provided compassionate presence. Chaplain encouraged the spouse of the patient. Spouse was feeling some guilt around their care for the patient. Chaplain allowed patient to share and was supported. Family does not want patient to suffer and asked staff to do their best not to allow the patient to be in pain.

## 2024-05-15 MED ORDER — MORPHINE 100MG IN NS 100ML (1MG/ML) PREMIX INFUSION: 1.0000 mg/h | INTRAVENOUS | Status: DC

## 2024-05-18 LAB — CULTURE, BLOOD (ROUTINE X 2)
Culture: NO GROWTH
Culture: NO GROWTH

## 2024-05-30 NOTE — Progress Notes (Signed)
 Nephrologist Lateef and family members at bedside. Pt was alert for a short time. Pt stopped breathing shortly after. Pt is absent of VS. Pt expired at 8:23 am. Wife & Children are at the bedside.

## 2024-05-30 NOTE — Death Summary Note (Signed)
 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  Per family patient had URI symptoms and had significant vomiting on Wednesday.  He has been having coughing     Pt denies any acute complaints.  He is able to tell me his name, location and current time and why he is in the hospital.  He denies any acute complaints but does say that he has been having some coughing and he has some chest pain when he coughs.  He denies any headaches, fever or chills.  He denies any trauma but family does report a poor memory.  Wife states since his brain injury and his 90s.  He has been independent in his ADLs but has poor judgment and needs help with executive tasks.  He is more withdrawn which has been the case for him.  At times he can be impulsive  which has been the case since his injury.     On arrival to the ED patient was noted to be HDS stable.  Lab work and imaging obtained.  CBC without leukocytosis, mild anemia but appears to be at his baseline.  CMP overall unremarkable except for mild BUN elevation and moderate hyperglycemia. Troponin mildly elevated but downtrending.  Head imaging was obtained which showed multiple sites of hemorrhage including hemorrhagic contusions involving the right and left anterior frontal lobes with adjacent sulcal subarachnoid hemorrhage with associated edema.  Other sites include hemorrhage along the planum sphenoid will and within the olfactory groove and also intraventricular hemorrhage within the occipital horns.  CT maxillofacial showed right parietal skull fracture that appears acute.  The case was discussed with Dr. Katrina and they are recommending non operative management given high risk with operative management.  Given this, [facility] contacted for admission.     Assessment and Plan:   05/04/2024: Admitted to [facility] with multifocal traumatic ICH and acute right parietal skull fracture. Neurosurgery consulted and recommended non-operative management.  12/07: Worsening mentation observed; repeat CT head showed mild progression of hemorrhage. Subcutaneous heparin  discontinued  12/09: Mental status improved; patient alert and following commands. EEG negative for seizures. Actively participating in physical therapy. 12/10: Developed new-onset Afib with RVR and agitation. Treated with IV lorazepam  2 mg for sedation and IV metoprolol  5 mg for rate control; heart rate controlled into the low 100s. 12/12: Severe hypernatremia (Na 159-161 mEq/L) and persistent hyperglycemia noted. Initiated IV D5W  for sodium correction and IV insulin  drip for glucose control. Neurosurgery recommended q2h sodium monitoring and ICU-level care. Nephrology consulted. ICU consulted for close monitoring, electrolyte management, and insulin   infusion. 12/13: Titrating D5W infusion for slow correction of hypernatremia. DC insulin  gtt, transitioned to lispro SS 12/14: hospitalists assume care, per family pt more alert today. Restarting IV fluids d/t persistent hypernatremia, if he does not start taking more po will revisit GOC w/ that in mind 12/15: adjusted insulin  basal + SSI q4h (hypoglycemic event), remains hypernatremic - likely hyperglycemia causing osmotic diuresis worsening hypernatremia but D5W fluids only good option for sodium correction and is making Glc management a challenge.  12/16: remains more encephalopathic and more agitated today requiring mor prn meds to keep calm/peaceful. Discussion today w/ family - he is not improving and is unlikely to recover to what he would consider a meaningful QOL. Decision made to transition to comfort measures only  Patient expired at 8:23 AM with family present            Procedures: None  Consultations: Neurosurgery, nephrology, ICU  The results of significant diagnostics from this hospitalization (including imaging, microbiology, ancillary and laboratory) are listed below for reference.   Significant Diagnostic Studies: DG Chest Port 1 View Result Date: 05/13/2024 EXAM: 1 VIEW(S) XRAY OF THE CHEST 05/13/2024 07:10:00 PM COMPARISON: 05/06/2024 CLINICAL HISTORY: SOB (shortness of breath) FINDINGS: LUNGS AND PLEURA: Developing patchy opacities at left lung base. No pleural effusion. No pneumothorax. HEART AND MEDIASTINUM: Atherosclerotic calcifications. No acute abnormality of the cardiac and mediastinal silhouettes. BONES AND SOFT TISSUES: Old left rib fractures. IMPRESSION: 1. Developing patchy opacities at the left lung base, suspicious for aspiration or infection. Electronically signed by: Oneil Devonshire MD 05/13/2024 07:31 PM EST RP Workstation: [id number]   CT HEAD WO CONTRAST ( ) Result Date: 05/13/2024 EXAM: CT HEAD WITHOUT 05/13/2024 05:49:36 PM TECHNIQUE: CT of the head  was performed without the administration of intravenous contrast. Automated exposure control, iterative reconstruction, and/or weight based adjustment of the mA/kV was utilized to reduce the radiation dose to as low as reasonably achievable. COMPARISON: Head CT 05/05/2024 and MRI 05/08/2024. CLINICAL HISTORY: More somnolent, known ICH, eval for worsening bleed. FINDINGS: BRAIN AND VENTRICLES: Bilateral frontal lobe hemorrhagic contusions with associated edema, subdural hematomas over the cerebral convexities measuring up to 3 mm in thickness on the right and 9 mm on the left, small volume subdural hematoma along the falx, scattered bilateral subarachnoid hemorrhage, and trace hemorrhage in the lateral ventricles have not appreciably changed from the prior MRI. A small anterior left temporal lobe contusion and thin subdural hematomas in the posterior fossa were better shown on the prior MRI. Intracranial mass effect including 4 mm of rightward midline shift is unchanged from the prior MRI. The ventricles are unchanged in size without evidence of hydrocephalus. No definite new intracranial hemorrhage or acute infarct is identified. There is mild cerebral atrophy. The basilar cisterns are patent. Calcified atherosclerosis at the skull base. ORBITS: No acute abnormality. SINUSES AND MASTOIDS: Mild mucosal thickening in the paranasal sinuses. Minimal bilateral mastoid fluid. SOFT TISSUES AND SKULL: Unchanged right frontoparietal skull fracture. IMPRESSION: 1. Multifocal traumatic intracranial hemorrhage, unchanged from 05/08/2024. Unchanged mass effect including 4 mm of rightward midline shift. 2. No definite new intracranial abnormality. Electronically signed by: Dasie Hamburg MD 05/13/2024 06:47 PM EST RP Workstation: [id number]   ECHOCARDIOGRAM COMPLETE Result Date: 05/09/2024    ECHOCARDIOGRAM REPORT   Patient Name:   STEVON GOUGH Date of Exam: 05/08/2024  Medical Rec #:  [id number]        Height:       65.0 in  Accession #:    [id number]       Weight:       151.9 lb Date of Birth:  [birth year]        BSA:          1.760 m Patient Age:    78 years         BP:           123/59 mmHg Patient Gender: M                HR:           94 bpm. Exam Location:  [facility] Procedure: 2D Echo, Cardiac Doppler and Color Doppler (Both Spectral and Color            Flow Doppler were utilized during procedure). Indications:     I48.91 Atrial Fibrillation  History:         Patient has prior history of Echocardiogram examinations, most                  recent 10/09/2014. Risk Factors:Diabetes and Hypertension.  Sonographer:     Carl Coma RDCS Referring Phys:  [id number] ALEXANDRA DEZII Diagnosing Phys: Marsa Dooms MD IMPRESSIONS  1. Left ventricular ejection fraction, by estimation, is 50 to 55%. The left ventricle has low normal function. The left ventricle has no regional wall motion abnormalities. Left ventricular diastolic parameters are consistent with Grade I diastolic dysfunction (impaired relaxation).  2. Right ventricular systolic function is normal. The right ventricular size is normal.  3. The mitral valve is normal in structure. Mild mitral valve regurgitation. No evidence of mitral stenosis.  4. The aortic valve is normal in structure. Aortic valve regurgitation is not visualized. No aortic stenosis is present.  5. The inferior vena cava is normal in size with greater than 50% respiratory variability, suggesting right atrial pressure of 3 mmHg. FINDINGS  Left Ventricle: Left ventricular ejection fraction, by estimation, is 50 to 55%. The left ventricle has low normal function. The left ventricle has no regional wall motion abnormalities. Strain was performed and the global longitudinal strain is indeterminate. The left ventricular internal cavity size was normal in size. There is no left ventricular hypertrophy. Left ventricular diastolic parameters are consistent with Grade I diastolic dysfunction (impaired  relaxation). Right Ventricle: The right ventricular size is normal. No increase in right ventricular wall thickness. Right ventricular systolic function is normal. Left Atrium: Left atrial size was normal in size. Right Atrium: Right atrial size was normal in size. Pericardium: There is no evidence of pericardial effusion. Mitral Valve: The mitral valve is normal in structure. Mild mitral valve regurgitation. No evidence of mitral valve stenosis. Tricuspid Valve: The tricuspid valve is normal in structure. Tricuspid valve regurgitation is mild . No evidence of tricuspid stenosis. Aortic Valve: The aortic valve is normal in structure. Aortic valve regurgitation is not visualized. No aortic stenosis is present. Pulmonic Valve: The pulmonic valve was normal in structure. Pulmonic valve regurgitation is not visualized. No evidence of pulmonic stenosis. Aorta: The aortic root is normal in size and structure. Venous: The inferior vena cava is normal in size with greater than 50% respiratory variability, suggesting right atrial pressure of 3 mmHg. IAS/Shunts: No atrial level shunt detected by color flow Doppler. Additional Comments: 3D was performed not requiring image post processing on an independent workstation and was indeterminate.  LEFT VENTRICLE PLAX 2D LVIDd:         3.30 cm   Diastology LVIDs:         2.40 cm   LV e' medial:    5.38 cm/s LV PW:         1.20 cm   LV E/e' medial:  8.9 LV IVS:        1.40 cm   LV e' lateral:   9.24 cm/s LVOT diam:     2.10 cm   LV E/e' lateral: 5.2 LV SV:         43 LV SV Index:   24 LVOT Area:     3.46 cm  RIGHT VENTRICLE             IVC RV Basal diam:  3.00 cm     IVC diam: 1.40 cm RV S prime:     10.63 cm/s TAPSE (M-mode): 1.5 cm LEFT ATRIUM             Index        RIGHT ATRIUM          Index LA diam:        4.00 cm 2.27 cm/m   RA Area:     7.66 cm LA Vol (A2C):   25.1 ml 14.26 ml/m  RA Volume:   15.30 ml 8.69 ml/m LA Vol (A4C):   33.8 ml 19.21 ml/m LA Biplane Vol: 30.0 ml  17.05 ml/m  AORTIC VALVE LVOT Vmax:   85.13 cm/s LVOT Vmean:  56.167 cm/s LVOT VTI:    0.124 m  AORTA Ao Root diam: 4.00 cm Ao Asc diam:  3.20 cm MITRAL VALVE MV Area (PHT): 4.14 cm    SHUNTS MV Decel Time: 183 msec    Systemic VTI:  0.12 m MV E velocity: 47.97 cm/s  Systemic Diam: 2.10 cm MV A velocity: 99.43 cm/s MV E/A ratio:  0.48 Marsa Dooms MD Electronically signed by Marsa Dooms MD Signature Date/Time: 05/09/2024/7:38:10 AM    Final    MR BRAIN WO CONTRAST Result Date: 05/09/2024 EXAM: MRI BRAIN WITHOUT CONTRAST 05/08/2024 10:59:04 PM TECHNIQUE: Multiplanar multisequence MRI of the head/brain was performed without the administration of intravenous contrast. COMPARISON: MR Head without contrast 08/17/2023. CT head 05/05/2024 and earlier. CLINICAL HISTORY: 79 year old male with history of intracranial hemorrhage and trauma. FINDINGS: BRAIN AND VENTRICLES: Asymmetric subdural hematomas, along the left convexity measuring 6 to 7 mm in thickness (series 12 image 37 and series 11 image 16) and along the right convexity measuring 2 mm in thickness. Mild rightward midline shift of roughly 3 mm. Superimposed anterior bifrontal hemorrhagic contusions, right greater than left. Smaller acute hemorrhagic contusions in the anterior left temporal lobe (series 12 image 15 and series 14 image 22). Small volume of intraventricular hemorrhage most apparent in the occipital horns (series 14 image 34). Scattered bilateral subarachnoid hemorrhage (series 14 image 62, series 12 image 41). Furthermore, small volume bilateral more symmetric posterior fossa subdural blood on series 12 image 11, 2 mm in thickness. Bifrontal cerebral edema. No significant mass effect on the frontal horns. Underlying chronic encephalomalacia in the anterior right frontal lobe. No ventriculomegaly, stable ventricle size and configuration. Effaced suprasellar cistern. Other basilar cisterns are stable from earlier this year. Blood  related susceptibility artifact on DWI. No areas of diffusion restriction strongly suggestive of superimposed acute infarct. No mass. Normal flow voids. ORBITS: No acute abnormality. SINUSES AND MASTOIDS: Mild paranasal sinus and mastoid opacification has not significantly changed  from the MRI earlier this year. BONES AND SOFT TISSUES: Normal marrow signal. Stable visible cervical spine degeneration including ligamentous hypertrophy about the odontoid. Small volume retained secretions in the pharynx. IMPRESSION: 1. Multifocal posttraumatic intracranial hemorrhage not significantly changed from 05/05/2024 CT head: Subdural hematomas - left convexity (6-7 mm), right convexity (2 mm), and bilateral posterior fossa measuring 2 mm. Right > left anterior frontal lobe and left anterior temporal lobe Hemorrhagic contusions. Scattered bilateral Subarachnoid hemorrhages. Small volume intraventricular hemorrhagic hemorrhage. 2. Effaced suprasellar cistern and mild rightward midline shift (3 mm). No other significant intracranial mass effect. Basilar cisterns are patent. 3. No ventriculomegaly. No superimposed acute infarct. No other complicating features. Electronically signed by: Helayne Hurst MD 05/09/2024 04:02 AM EST RP Workstation: [id number]   EEG adult Result Date: 05/07/2024 Shelton Arlin KIDD, MD     05/07/2024  1:02 PM Patient Name: ABDIRAHMAN CHITTUM MRN: [id number] Epilepsy Attending: Arlin KIDD Shelton Referring Physician/Provider: Dezii, Alexandra, DO Date: 05/07/2024 Duration: 27.19 mins Patient history: 79yo M with ho TBI with bifrontal contusions, subdural hematoma, and subarachnoid hemorrhage. EEG to evaluate for seizure Level of alertness: Awake AEDs during EEG study: VPA, Ativan  Technical aspects: This EEG study was done with scalp electrodes positioned according to the 10-20 International system of electrode placement. Electrical activity was reviewed with band pass filter of 1-70Hz , sensitivity of 7 uV/mm,  display speed of 76mm/sec with a 60Hz  notched filter applied as appropriate. EEG data were recorded continuously and digitally stored.  Video monitoring was available and reviewed as appropriate. Description: EEG showed continuous generalized 5 to 6 Hz theta slowing admixed with 13-15hz  beta activity. Hyperventilation and photic stimulation were not performed.   ABNORMALITY - Continuous slow, generalized IMPRESSION: This study is suggestive of generalized cerebral dysfunction (encephalopathy). No seizures or epileptiform discharges were seen throughout the recording. Arlin KIDD Shelton   DG Chest Port 1 View Result Date: 05/06/2024 EXAM: 1 VIEW(S) XRAY OF THE CHEST 05/06/2024 07:50:00 AM COMPARISON: 05/03/2024 CLINICAL HISTORY: Aspiration into airway FINDINGS: LUNGS AND PLEURA: Low lung volume. No focal pulmonary opacity. No pleural effusion. No pneumothorax. HEART AND MEDIASTINUM: No acute abnormality of the cardiac and mediastinal silhouettes. BONES AND SOFT TISSUES: Old healed left posterolateral rib fractures. No acute osseous abnormality. IMPRESSION: 1. No acute findings. 2. Low lung volume. Electronically signed by: Evalene Coho MD 05/06/2024 08:52 AM EST RP Workstation: [id number]   CT HEAD WO CONTRAST ( ) Result Date: 05/05/2024 EXAM: CT HEAD WITHOUT CONTRAST 05/05/2024 02:26:00 PM TECHNIQUE: CT of the head was performed without the administration of intravenous contrast. Automated exposure control, iterative reconstruction, and/or weight based adjustment of the mA/kV was utilized to reduce the radiation dose to as low as reasonably achievable. COMPARISON: 05/03/2024 CLINICAL HISTORY: Head trauma, minor (Age >= 65y) FINDINGS: BRAIN AND VENTRICLES: Stable hemorrhagic contusions in bilateral frontal lobes with stable surrounding edema and mass effect on the anterior falx. Stable right subdural hematoma measuring up to 3 mm. Areas of subarachnoid hemorrhage over the posterior parietal and occipital  lobes bilaterally are more prominent than on the prior study. The subdural collection over the left frontal and parietal convexity has increased slightly, now measuring 4.5 mm maximally. No evidence of acute infarct. No hydrocephalus. No midline shift. ORBITS: No acute abnormality. SINUSES: Paranasal sinus mucosal thickening. SOFT TISSUES AND SKULL: No acute soft tissue abnormality. Stable right frontal calvarial fracture. Calcific atherosclerosis. IMPRESSION: 1. Stable hemorrhagic contusions in bilateral frontal lobes with surrounding edema and mass effect on anterior falx. 2. Increased subdural  collection over the left frontal and parietal convexity, now measuring 4.5 mm maximally. 3. More prominent subarachnoid hemorrhage over the posterior parietal and occipital lobes bilaterally compared to the prior study. 4. Stable right subdural hematoma measuring up to 3 mm. 5. Stable right frontal calvarial fracture. Electronically signed by: Lonni Necessary MD 05/05/2024 02:41 PM EST RP Workstation: [id number]   CT HEAD WO CONTRAST Result Date: 05/03/2024 EXAM: CT HEAD WITHOUT CONTRAST 05/03/2024 08:36:30 PM TECHNIQUE: CT of the head was performed without the administration of intravenous contrast. Automated exposure control, iterative reconstruction, and/or weight based adjustment of the mA/kV was utilized to reduce the radiation dose to as low as reasonably achievable. COMPARISON: Earlier today. CLINICAL HISTORY: Stroke, follow up. FINDINGS: BRAIN AND VENTRICLES: Extensive hemorrhagic contusions involving the anterior frontal lobes bilaterally, unchanged. Surrounding edema within the frontal lobes, also stable. Adjacent subarachnoid hemorrhage. Right frontal subdural hematoma measures 3 mm in thickness, stable. Contusion in the left inferior temporal lobe along the floor of the left middle cranial fossa is stable. No evidence of acute infarct. No hydrocephalus. No midline shift. ORBITS: No acute abnormality.  SINUSES: No acute abnormality. SOFT TISSUES AND SKULL: Right frontal skull fracture extending toward the high right parietal bone is unchanged. No acute soft tissue abnormality. IMPRESSION: 1. Extensive hemorrhagic contusions involving the anterior frontal lobes bilaterally with surrounding edema, unchanged. 2. Adjacent subarachnoid hemorrhage, unchanged. 3. Stable right frontal subdural hematoma measuring 3 mm in thickness. 4. Stable contusion in the left inferior temporal lobe and on the floor of the left middle cranial fossa. 5. Unchanged right frontal skull fracture extending toward the high right parietal bone. 6. No hydrocephalus or midline shift. Electronically signed by: Franky Crease MD 05/03/2024 08:48 PM EST RP Workstation: [id number]   CT Head Wo Contrast Result Date: 05/03/2024 EXAM: CT HEAD WITHOUT CONTRAST 05/03/2024 01:46:12 PM TECHNIQUE: CT of the head was performed without the administration of intravenous contrast. Automated exposure control, iterative reconstruction, and/or weight based adjustment of the mA/kV was utilized to reduce the radiation dose to as low as reasonably achievable. COMPARISON: MRI brain 08/17/2023 and CT head 09/30/2014. CLINICAL HISTORY: Head trauma, minor (Age >= 65y). FINDINGS: BRAIN AND VENTRICLES: Extensive hemorrhagic contusions involving the right greater than left bilateral anterior frontal lobes with adjacent foci of sulcal subarachnoid hemorrhage. There is adjacent parenchymal hypoattenuation likely representing edema. There is a more confluent 4.2 x 2.6 x 1.3 cm (AP x TR CC) extraaxial hemorrhage along the planum sphenoidale and within the right olfactory groove. There is preexisting encephalomalacia and gliosis involving the right frontal lobe. There are additional foci of hemorrhagic contusion and subarachnoid hemorrhage involving the inferior left temporal lobe/ left middle cranial fossa. There is a thin significant right temporal convexity subdural hemorrhage  . No acute territorial infarction. No midline shift. There are mild layering hemorrhages within the bilateral occipital horns. No hydrocephalus. ORBITS: Post surgical fixation to the right superior orbit. Chronic right inferior orbital wall fracture. SINUSES: There is scattered opacification of the bilateral ethmoid air cells. Post surgical changes to the right frontal sinus. Chronic right anterior lamina papyracea deformity. Chronic bony nasal septal deformity. SOFT TISSUES AND SKULL: Evidence of prior craniofacial trauma. Post surgical fixation to the right superior orbit and frontal bone. Chronic asymmetry and deformity of the right cribriform plate. There is suspicion for increased posterior propagation of a right nondisplaced linear skull fracture into the parietal bone which may represent superimposed acute skull fracture. No acute displaced or depressed calvarial fracture. Trace left mastoid  effusion. No acute soft tissue abnormality. IMPRESSION: 1. Hemorrhagic contusions involving the right greater than left anterior frontal lobes with adjacent sulcal subarachnoid hemorrhage and associated edema. 2. More confluent approximately 4 cm extraaxial hemorrhage along the planum sphenoidale and within the olfactory groove. 3. Mild layering intraventricular hemorrhage within the bilateral occipital horns also likely posttraumatic. 4. Suspicion for acute nondisplaced right parietal skull fracture, extending more posteriorly since 2016. 5. No acute facial bone fracture. 6. These findings were communicated to Dr. Levander by telephone at 2:38 PM. Electronically signed by: prentice bybordi 05/03/2024 03:40 PM EST RP Workstation: [id number]   CT Maxillofacial Wo Contrast Result Date: 05/03/2024 EXAM: CT HEAD WITHOUT CONTRAST 05/03/2024 01:46:12 PM TECHNIQUE: CT of the head was performed without the administration of intravenous contrast. Automated exposure control, iterative reconstruction, and/or weight based adjustment of  the mA/kV was utilized to reduce the radiation dose to as low as reasonably achievable. COMPARISON: MRI brain 08/17/2023 and CT head 09/30/2014. CLINICAL HISTORY: Head trauma, minor (Age >= 65y). FINDINGS: BRAIN AND VENTRICLES: Extensive hemorrhagic contusions involving the right greater than left bilateral anterior frontal lobes with adjacent foci of sulcal subarachnoid hemorrhage. There is adjacent parenchymal hypoattenuation likely representing edema. There is a more confluent 4.2 x 2.6 x 1.3 cm (AP x TR CC) extraaxial hemorrhage along the planum sphenoidale and within the right olfactory groove. There is preexisting encephalomalacia and gliosis involving the right frontal lobe. There are additional foci of hemorrhagic contusion and subarachnoid hemorrhage involving the inferior left temporal lobe/ left middle cranial fossa. There is a thin significant right temporal convexity subdural hemorrhage . No acute territorial infarction. No midline shift. There are mild layering hemorrhages within the bilateral occipital horns. No hydrocephalus. ORBITS: Post surgical fixation to the right superior orbit. Chronic right inferior orbital wall fracture. SINUSES: There is scattered opacification of the bilateral ethmoid air cells. Post surgical changes to the right frontal sinus. Chronic right anterior lamina papyracea deformity. Chronic bony nasal septal deformity. SOFT TISSUES AND SKULL: Evidence of prior craniofacial trauma. Post surgical fixation to the right superior orbit and frontal bone. Chronic asymmetry and deformity of the right cribriform plate. There is suspicion for increased posterior propagation of a right nondisplaced linear skull fracture into the parietal bone which may represent superimposed acute skull fracture. No acute displaced or depressed calvarial fracture. Trace left mastoid effusion. No acute soft tissue abnormality. IMPRESSION: 1. Hemorrhagic contusions involving the right greater than left  anterior frontal lobes with adjacent sulcal subarachnoid hemorrhage and associated edema. 2. More confluent approximately 4 cm extraaxial hemorrhage along the planum sphenoidale and within the olfactory groove. 3. Mild layering intraventricular hemorrhage within the bilateral occipital horns also likely posttraumatic. 4. Suspicion for acute nondisplaced right parietal skull fracture, extending more posteriorly since 2016. 5. No acute facial bone fracture. 6. These findings were communicated to Dr. Levander by telephone at 2:38 PM. Electronically signed by: prentice bybordi 05/03/2024 03:40 PM EST RP Workstation: [id number]   CT Cervical Spine Wo Contrast Result Date: 05/03/2024 EXAM: CT CERVICAL SPINE WITHOUT CONTRAST 05/03/2024 01:46:12 PM TECHNIQUE: CT of the cervical spine was performed without the administration of intravenous contrast. Multiplanar reformatted images are provided for review. Automated exposure control, iterative reconstruction, and/or weight based adjustment of the mA/kV was utilized to reduce the radiation dose to as low as reasonably achievable. COMPARISON: None available. CLINICAL HISTORY: Neck trauma (Age >= 65y) FINDINGS: CERVICAL SPINE: BONES AND ALIGNMENT: No acute fracture or traumatic malalignment. 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 DEGENERATIVE CHANGES: There is multilevel intervertebral disc height loss most pronounced at C6-C7. There is fusion of the C3-C4 facet joints on the left. No high-grade spinal canal stenosis within the limitations of CT. There is multilevel uncovertebral spurring and facet arthropathy with varying degrees of neural foraminal stenosis bilaterally. SOFT TISSUES: No prevertebral soft tissue swelling. Bilateral maxillary sinus retention cysts versus polyps. There are aerated secretions within the piriform sinuses. IMPRESSION: 1. No acute fracture or traumatic malalignment of the cervical spine. Electronically signed by: prentice bybordi 05/03/2024  02:30 PM EST RP Workstation: [id number]   DG Chest Portable 1 View Result Date: 05/03/2024 EXAM: 1 VIEW(S) XRAY OF THE CHEST 05/03/2024 01:31:00 PM COMPARISON: 04/28/2017 bilateral rib fractures are again noted. CLINICAL HISTORY: chest pain FINDINGS: LUNGS AND PLEURA: No focal pulmonary opacity. No pleural effusion. No pneumothorax. HEART AND MEDIASTINUM: No acute abnormality of the cardiac and mediastinal silhouettes. BONES AND SOFT TISSUES: Bilateral rib fractures are again noted. IMPRESSION: 1. No acute cardiopulmonary process. Chronic bilateral rib fractures, unchanged from 04/28/2017. Electronically signed by: Lynwood Seip MD 05/03/2024 01:54 PM EST RP Workstation: [id number]    Microbiology: Recent Results (from the past 240 hours)  Resp panel by RT-PCR (RSV, Flu A&B, Covid) Anterior Nasal Swab     Status: None   Collection Time: 05/06/24  3:26 PM   Specimen: Anterior Nasal Swab  Result Value Ref Range Status   SARS Coronavirus 2 by RT PCR NEGATIVE NEGATIVE Final    Comment: (NOTE) SARS-CoV-2 target nucleic acids are NOT DETECTED.  The SARS-CoV-2 RNA is generally detectable in upper respiratory specimens during the acute phase of infection. The lowest concentration of SARS-CoV-2 viral copies this assay can detect is 138 copies/mL. A negative result does not preclude SARS-Cov-2 infection and should not be used as the sole basis for treatment or other patient management decisions. A negative result may occur with  improper specimen collection/handling, submission of specimen other than nasopharyngeal swab, presence of viral mutation(s) within the areas targeted by this assay, and inadequate number of viral copies(<138 copies/mL). A negative result must be combined with clinical observations, patient history, and epidemiological information. The expected result is Negative.  Fact Sheet for Patients:  bloggercourse.com  Fact Sheet for Healthcare Providers:   seriousbroker.it  This test is no t yet approved or cleared by the [place]  FDA and  has been authorized for detection and/or diagnosis of SARS-CoV-2 by FDA under an Emergency Use Authorization (EUA). This EUA will remain  in effect (meaning this test can be used) for the duration of the COVID-19 declaration under Section 564(b)(1) of the Act, 21 U.S.C.section 360bbb-3(b)(1), unless the authorization is terminated  or revoked sooner.       Influenza A by PCR NEGATIVE NEGATIVE Final   Influenza B by PCR NEGATIVE NEGATIVE Final    Comment: (NOTE) The Xpert Xpress SARS-CoV-2/FLU/RSV plus assay is intended as an aid in the diagnosis of influenza from Nasopharyngeal swab specimens and should not be used as a sole basis for treatment. Nasal washings and aspirates are unacceptable for Xpert Xpress SARS-CoV-2/FLU/RSV testing.  Fact Sheet for Patients: bloggercourse.com  Fact Sheet for Healthcare Providers: seriousbroker.it  This test is not yet approved or cleared by the [place]  FDA and has been authorized for detection and/or diagnosis of SARS-CoV-2 by FDA under an Emergency Use Authorization (EUA). This EUA will remain in effect (meaning this test can be used) for the duration of the COVID-19 declaration under Section 564(b)(1) of the Act,  21 U.S.C. section 360bbb-3(b)(1), unless the authorization is terminated or revoked.     Resp Syncytial Virus by PCR NEGATIVE NEGATIVE Final    Comment: (NOTE) Fact Sheet for Patients: bloggercourse.com  Fact Sheet for Healthcare Providers: seriousbroker.it  This test is not yet approved or cleared by the [place]  FDA and has been authorized for detection and/or diagnosis of SARS-CoV-2 by FDA under an Emergency Use Authorization (EUA). This EUA will remain in effect (meaning this test can be used) for  the duration of the COVID-19 declaration under Section 564(b)(1) of the Act, 21 U.S.C. section 360bbb-3(b)(1), unless the authorization is terminated or revoked.  Performed at [facility], [address] [place], [place] [address]   Respiratory (~20 pathogens) panel by PCR     Status: None   Collection Time: 05/06/24  3:26 PM   Specimen: Nasopharyngeal Swab; Respiratory  Result Value Ref Range Status   Adenovirus NOT DETECTED NOT DETECTED Final   Coronavirus 229E NOT DETECTED NOT DETECTED Final    Comment: (NOTE) The Coronavirus on the Respiratory Panel, DOES NOT test for the novel  Coronavirus (2019 nCoV)    Coronavirus HKU1 NOT DETECTED NOT DETECTED Final   Coronavirus NL63 NOT DETECTED NOT DETECTED Final   Coronavirus OC43 NOT DETECTED NOT DETECTED Final   Metapneumovirus NOT DETECTED NOT DETECTED Final   Rhinovirus / Enterovirus NOT DETECTED NOT DETECTED Final   Influenza A NOT DETECTED NOT DETECTED Final   Influenza B NOT DETECTED NOT DETECTED Final   Parainfluenza Virus 1 NOT DETECTED NOT DETECTED Final   Parainfluenza Virus 2 NOT DETECTED NOT DETECTED Final   Parainfluenza Virus 3 NOT DETECTED NOT DETECTED Final   Parainfluenza Virus 4 NOT DETECTED NOT DETECTED Final   Respiratory Syncytial Virus NOT DETECTED NOT DETECTED Final   Bordetella pertussis NOT DETECTED NOT DETECTED Final   Bordetella Parapertussis NOT DETECTED NOT DETECTED Final   Chlamydophila pneumoniae NOT DETECTED NOT DETECTED Final   Mycoplasma pneumoniae NOT DETECTED NOT DETECTED Final    Comment: Performed at [facility], [address], [place], [place] [address]  Culture, blood (Routine X 2) w Reflex to ID Panel     Status: None (Preliminary result)   Collection Time: 05/13/24  8:44 PM   Specimen: BLOOD  Result Value Ref Range Status   Specimen Description BLOOD BLOOD RIGHT WRIST  Final   Special Requests   Final    BOTTLES DRAWN AEROBIC ONLY Blood Culture results may not be  optimal due to an inadequate volume of blood received in culture bottles   Culture   Final    NO GROWTH 2 DAYS Performed at [facility], [address], [place], [place] [address]    Report Status PENDING  Incomplete  Culture, blood (Routine X 2) w Reflex to ID Panel     Status: None (Preliminary result)   Collection Time: 05/13/24  8:45 PM   Specimen: BLOOD  Result Value Ref Range Status   Specimen Description BLOOD BLOOD LEFT WRIST  Final   Special Requests   Final    BOTTLES DRAWN AEROBIC AND ANAEROBIC Blood Culture results may not be optimal due to an inadequate volume of blood received in culture bottles   Culture   Final    NO GROWTH 2 DAYS Performed at [facility], [address], [place], [place] [address]    Report Status PENDING  Incomplete    Time spent: 35 minutes  Signed: Aimee Somerset, MD 05/20/2024

## 2024-05-30 NOTE — Plan of Care (Signed)
 Problem: Education: Goal: Knowledge of disease or condition will improve Outcome: Progressing Goal: Knowledge of secondary prevention will improve (MUST DOCUMENT ALL) Outcome: Progressing Goal: Knowledge of patient specific risk factors will improve (DELETE if not current risk factor) Outcome: Progressing   Problem: Intracerebral Hemorrhage Tissue Perfusion: Goal: Complications of Intracerebral Hemorrhage will be minimized Outcome: Progressing   Problem: Coping: Goal: Will verbalize positive feelings about self Outcome: Progressing Goal: Will identify appropriate support needs Outcome: Progressing   Problem: Health Behavior/Discharge Planning: Goal: Ability to manage health-related needs will improve Outcome: Progressing Goal: Goals will be collaboratively established with patient/family Outcome: Progressing   Problem: Self-Care: Goal: Ability to participate in self-care as condition permits will improve Outcome: Progressing Goal: Verbalization of feelings and concerns over difficulty with self-care will improve Outcome: Progressing Goal: Ability to communicate needs accurately will improve Outcome: Progressing   Problem: Nutrition: Goal: Risk of aspiration will decrease Outcome: Progressing Goal: Dietary intake will improve Outcome: Progressing   Problem: Education: Goal: Ability to describe self-care measures that may prevent or decrease complications (Diabetes Survival Skills Education) will improve Outcome: Progressing Goal: Individualized Educational Video(s) Outcome: Progressing   Problem: Coping: Goal: Ability to adjust to condition or change in health will improve Outcome: Progressing   Problem: Fluid Volume: Goal: Ability to maintain a balanced intake and output will improve Outcome: Progressing   Problem: Health Behavior/Discharge Planning: Goal: Ability to identify and utilize available resources and services will improve Outcome: Progressing Goal:  Ability to manage health-related needs will improve Outcome: Progressing   Problem: Metabolic: Goal: Ability to maintain appropriate glucose levels will improve Outcome: Progressing   Problem: Nutritional: Goal: Maintenance of adequate nutrition will improve Outcome: Progressing Goal: Progress toward achieving an optimal weight will improve Outcome: Progressing   Problem: Skin Integrity: Goal: Risk for impaired skin integrity will decrease Outcome: Progressing   Problem: Tissue Perfusion: Goal: Adequacy of tissue perfusion will improve Outcome: Progressing   Problem: Education: Goal: Knowledge of General Education information will improve Description: Including pain rating scale, medication(s)/side effects and non-pharmacologic comfort measures Outcome: Progressing   Problem: Health Behavior/Discharge Planning: Goal: Ability to manage health-related needs will improve Outcome: Progressing   Problem: Clinical Measurements: Goal: Ability to maintain clinical measurements within normal limits will improve Outcome: Progressing Goal: Will remain free from infection Outcome: Progressing Goal: Diagnostic test results will improve Outcome: Progressing Goal: Respiratory complications will improve Outcome: Progressing Goal: Cardiovascular complication will be avoided Outcome: Progressing   Problem: Activity: Goal: Risk for activity intolerance will decrease Outcome: Progressing   Problem: Nutrition: Goal: Adequate nutrition will be maintained Outcome: Progressing   Problem: Coping: Goal: Level of anxiety will decrease Outcome: Progressing   Problem: Elimination: Goal: Will not experience complications related to bowel motility Outcome: Progressing Goal: Will not experience complications related to urinary retention Outcome: Progressing   Problem: Pain Managment: Goal: General experience of comfort will improve and/or be controlled Outcome: Progressing   Problem:  Safety: Goal: Ability to remain free from injury will improve Outcome: Progressing   Problem: Skin Integrity: Goal: Risk for impaired skin integrity will decrease Outcome: Progressing   Problem: Activity: Goal: Ability to tolerate increased activity will improve Outcome: Progressing   Problem: Clinical Measurements: Goal: Ability to maintain a body temperature in the normal range will improve Outcome: Progressing   Problem: Respiratory: Goal: Ability to maintain adequate ventilation will improve Outcome: Progressing Goal: Ability to maintain a clear airway will improve Outcome: Progressing   Problem: Education: Goal: Knowledge of the prescribed therapeutic  regimen will improve Outcome: Progressing   Problem: Coping: Goal: Ability to identify and develop effective coping behavior will improve Outcome: Progressing   Problem: Clinical Measurements: Goal: Quality of life will improve Outcome: Progressing

## 2024-05-30 DEATH — deceased

## 2024-12-24 ENCOUNTER — Ambulatory Visit: Admitting: Dermatology
